# Patient Record
Sex: Male | Born: 1940
Health system: Southern US, Community
[De-identification: ages and names within clinical notes are randomized; demographics above are authoritative.]

## PROBLEM LIST (undated history)

## (undated) DIAGNOSIS — F329 Major depressive disorder, single episode, unspecified: Secondary | ICD-10-CM

## (undated) DIAGNOSIS — F32A Depression, unspecified: Secondary | ICD-10-CM

## (undated) DIAGNOSIS — C801 Malignant (primary) neoplasm, unspecified: Secondary | ICD-10-CM

## (undated) DIAGNOSIS — G20A1 Parkinson's disease without dyskinesia, without mention of fluctuations: Secondary | ICD-10-CM

## (undated) DIAGNOSIS — G459 Transient cerebral ischemic attack, unspecified: Secondary | ICD-10-CM

## (undated) DIAGNOSIS — K4021 Bilateral inguinal hernia, without obstruction or gangrene, recurrent: Secondary | ICD-10-CM

## (undated) DIAGNOSIS — G20 Parkinson's disease: Secondary | ICD-10-CM

## (undated) DIAGNOSIS — N200 Calculus of kidney: Secondary | ICD-10-CM

## (undated) DIAGNOSIS — F419 Anxiety disorder, unspecified: Secondary | ICD-10-CM

## (undated) DIAGNOSIS — E119 Type 2 diabetes mellitus without complications: Secondary | ICD-10-CM

## (undated) DIAGNOSIS — R112 Nausea with vomiting, unspecified: Secondary | ICD-10-CM

## (undated) DIAGNOSIS — R51 Headache: Secondary | ICD-10-CM

## (undated) DIAGNOSIS — R011 Cardiac murmur, unspecified: Secondary | ICD-10-CM

## (undated) DIAGNOSIS — K219 Gastro-esophageal reflux disease without esophagitis: Secondary | ICD-10-CM

## (undated) DIAGNOSIS — Z9889 Other specified postprocedural states: Secondary | ICD-10-CM

## (undated) DIAGNOSIS — G2581 Restless legs syndrome: Secondary | ICD-10-CM

## (undated) DIAGNOSIS — I10 Essential (primary) hypertension: Secondary | ICD-10-CM

## (undated) DIAGNOSIS — Z952 Presence of prosthetic heart valve: Secondary | ICD-10-CM

## (undated) DIAGNOSIS — I350 Nonrheumatic aortic (valve) stenosis: Secondary | ICD-10-CM

## (undated) DIAGNOSIS — M1990 Unspecified osteoarthritis, unspecified site: Secondary | ICD-10-CM

## (undated) HISTORY — PX: COLONOSCOPY: SHX174

## (undated) HISTORY — PX: EYE SURGERY: SHX253

---

## 1998-11-01 ENCOUNTER — Encounter: Payer: Self-pay | Admitting: Occupational Medicine

## 1998-11-01 ENCOUNTER — Ambulatory Visit: Admission: RE | Admit: 1998-11-01 | Discharge: 1998-11-01 | Payer: Self-pay | Admitting: Occupational Medicine

## 1998-11-24 ENCOUNTER — Ambulatory Visit: Admission: RE | Admit: 1998-11-24 | Discharge: 1998-11-24 | Payer: Self-pay | Admitting: Occupational Medicine

## 1998-11-24 ENCOUNTER — Encounter: Payer: Self-pay | Admitting: Occupational Medicine

## 1998-11-29 ENCOUNTER — Ambulatory Visit (HOSPITAL_COMMUNITY): Admission: RE | Admit: 1998-11-29 | Discharge: 1998-11-29 | Payer: Self-pay | Admitting: Internal Medicine

## 1998-11-29 ENCOUNTER — Encounter: Payer: Self-pay | Admitting: Internal Medicine

## 1998-12-09 ENCOUNTER — Ambulatory Visit: Admission: RE | Admit: 1998-12-09 | Discharge: 1998-12-09 | Payer: Self-pay | Admitting: Internal Medicine

## 2000-12-05 ENCOUNTER — Encounter: Admission: RE | Admit: 2000-12-05 | Discharge: 2000-12-05 | Payer: Self-pay | Admitting: Internal Medicine

## 2000-12-05 ENCOUNTER — Encounter: Payer: Self-pay | Admitting: Internal Medicine

## 2003-11-09 ENCOUNTER — Ambulatory Visit (HOSPITAL_BASED_OUTPATIENT_CLINIC_OR_DEPARTMENT_OTHER): Admission: RE | Admit: 2003-11-09 | Discharge: 2003-11-09 | Payer: Self-pay | Admitting: Internal Medicine

## 2006-05-15 HISTORY — PX: OTHER SURGICAL HISTORY: SHX169

## 2006-12-07 ENCOUNTER — Encounter: Admission: RE | Admit: 2006-12-07 | Discharge: 2007-03-07 | Payer: Self-pay | Admitting: Neurology

## 2006-12-11 DIAGNOSIS — G20 Parkinson's disease: Secondary | ICD-10-CM

## 2006-12-11 DIAGNOSIS — G20A1 Parkinson's disease without dyskinesia, without mention of fluctuations: Secondary | ICD-10-CM

## 2006-12-11 HISTORY — DX: Parkinson's disease: G20

## 2006-12-11 HISTORY — DX: Parkinson's disease without dyskinesia, without mention of fluctuations: G20.A1

## 2007-02-08 ENCOUNTER — Inpatient Hospital Stay (HOSPITAL_COMMUNITY): Admission: EM | Admit: 2007-02-08 | Discharge: 2007-02-09 | Payer: Self-pay | Admitting: Neurology

## 2007-02-08 ENCOUNTER — Encounter: Payer: Self-pay | Admitting: Emergency Medicine

## 2007-02-08 DIAGNOSIS — G459 Transient cerebral ischemic attack, unspecified: Secondary | ICD-10-CM

## 2007-02-08 HISTORY — DX: Transient cerebral ischemic attack, unspecified: G45.9

## 2007-03-08 ENCOUNTER — Encounter: Admission: RE | Admit: 2007-03-08 | Discharge: 2007-06-06 | Payer: Self-pay | Admitting: Neurology

## 2009-04-13 ENCOUNTER — Encounter: Payer: Self-pay | Admitting: Neurology

## 2009-04-14 ENCOUNTER — Encounter: Payer: Self-pay | Admitting: Neurology

## 2009-05-15 ENCOUNTER — Encounter: Payer: Self-pay | Admitting: Neurology

## 2009-06-15 ENCOUNTER — Encounter: Payer: Self-pay | Admitting: Neurology

## 2009-12-22 ENCOUNTER — Encounter: Admission: RE | Admit: 2009-12-22 | Discharge: 2010-02-09 | Payer: Self-pay | Admitting: Neurology

## 2010-09-27 NOTE — H&P (Signed)
NAME:  [name], [name]              ACCOUNT NO.:  [id number]   MEDICAL RECORD NO.:  [id number]          PATIENT TYPE:  INP   LOCATION:  3706                         FACILITY:  [facility]   PHYSICIAN:  Michael L. Reynolds, M.D.DATE OF BIRTH:  1941/02/08   DATE OF ADMISSION:  02/08/2007  DATE OF DISCHARGE:  02/09/2007                              HISTORY & PHYSICAL   CHIEF COMPLAINT:  Confusion, right hand numbness.   HISTORY OF PRESENT ILLNESS:  This is the initial stroke service  admission for this 70 year old man with a history of Parkinson disease  followed through our office by Dr. Vickey Huger.  The patient and his wife  reports that he was in his usual state of health until this afternoon  about 3:30 or 4 p.m.  He then suddenly became confused.  He seemed to  have difficulty understanding how to perform simple actions, such as a  opening door to walk out into the yard.  His wife also felt that he was  having difficulty finding words and was using words inappropriately.  The patient says that around this time he had a numb sensation in is  right hand.  The symptoms lasted about 45 minutes and then improved.  He  feels now that he is pretty much back to normal, although he says his  legs feel a little bit weak.  He has never had any symptoms like this  before.  He came to the emergency department this evening, at which time  a Code Stroke was called, and because his symptoms are suspicious for  cerebrovascular disease, he is admitted for further considerations.   PAST MEDICAL HISTORY:  Remarkable for Parkinson disease as noted above.  Family is unaware of any chronic medical problems.   FAMILY HISTORY:  Remarkable for a stroke and possible Alzheimer disease  in his father.   SOCIAL:  He lives with his wife.  He is normally independent in his  activities of daily living.  He denies any history of tobacco or alcohol  use.   ALLERGIES:  No known drug allergies.   MEDICATIONS:  1.  Requip XL 4 mg q.h.s.  2. Zantac 300 mg q.h.s.  3. Klonopin 1 mg q.h.s.  4. Baby aspirin.  5. Iron.  6. Vitamin D.  7. Vitamin E.  8. Fiber.  9. Colace, Darvocet p.r.n.  10.Fioricet p.r.n.   REVIEW OF SYSTEMS:  A full 10-system review of systems is negative  except as outlined in the HPI and the accompanying nursing records.   PHYSICAL EXAMINATION:  Vital Signs: Temperature:  97.5, blood pressure  148/88, pulse 79, respirations 20.  GENERAL EXAMINATION:  This is a healthy-appearing man supine in the  hospital bed in no evident distress.  HEAD:  Cranium is normocephalic and atraumatic.  Oropharynx benign.  NECK:  Supple without carotid or supraclavicular bruits.  CHEST:  Clear to auscultation bilaterally.  HEART:  Regular rate and rhythm without murmurs.  ABDOMEN:  Soft, normoactive bowel sounds.  EXTREMITIES:  No edema, 2+ pulses.  NEUROLOGIC:  Mental status:  He is awake and alert.  He is oriented  to  time and place.  His speech is somewhat soft but appropriate in content.  He is able to name objects and repeat phrases without difficulty.  Cranial Nerves:  Pupils are equal and briskly reactive.  Extraocular  movements reveal some poor smooth pursuit and slight and slight  limitation of upgaze.  Visual fields are full to confrontation.  Hearing  is intact to conversational speech.  Face, tongue and palate move  normally and symmetrically with some masking of facial features.  Motor:  Normal bulk.  No cogwheeling to my examination.  Normal strength in all  tested extremity muscles.  Sensation intact to pinprick and light touch  in all extremities, intact to double simultaneous stimulation.  Coordination: Finger-nose as performed accurately.  Gait:  He arises  easily from a chair and his stance is normal.  He is able to ambulate  without much difficulty.  Reflexes diminished throughout.  Toes are  downgoing bilaterally.   LABORATORY DATA:  CBC:  White count 6.5, hemoglobin 15.3,  platelets  145,000.  CMET is normal except for an elevated glucose of 125.  CT of  the head demonstrates questionable old right subcortical stroke without  acute finding.   IMPRESSION:  Episode of acute confusion, speech disturbance, and right  hand numbness, suspicious for a left brain transient ischemic attack.   PLAN:  Will admit to the stroke service for brief stroke workup  including MRI of the brain, MRA of the intracranial and extracranial  circulation, with telemetry monitoring and stroke labs.  I will also  check a few blood sugars given that his blood sugar slightly elevated.  Hopefully, he can be discharged soon.      Michael L. Thad Ranger, M.D.  Electronically Signed     MLR/MEDQ  D:  02/08/2007  T:  02/09/2007  Job:  [id number]

## 2010-09-27 NOTE — Discharge Summary (Signed)
NAME:  [name], [name]              ACCOUNT NO.:  [id number]   MEDICAL RECORD NO.:  [id number]          PATIENT TYPE:  INP   LOCATION:  3706                         FACILITY:  [facility]   PHYSICIAN:  Michael L. Reynolds, M.D.DATE OF BIRTH:  Aug 22, 1940   DATE OF ADMISSION:  02/08/2007  DATE OF DISCHARGE:  02/09/2007                               DISCHARGE SUMMARY   ADMISSION DIAGNOSES:  1. Transient confusion and right-sided numbness,  ? acute stroke.  2. History of Parkinson disease.   DISCHARGE DIAGNOSES:  1. Episode of transient neurologic function of uncertain etiology      without evidence of acute stroke.  2. Parkinson disease.   CONDITION ON DISCHARGE:  Improved.   DIET:  Low-salt, low-fat, low-cholesterol diet.   ACTIVITY:  Ad lib.   MEDICATIONS ON DISCHARGE:  1. Aspirin 325 mg daily (new dose).  2. Requip 4 mg daily.  3. Zantac 300 mg daily.  4. Clonazepam 1 mg daily.  5. Ferrous sulfate 324 mg daily.  6. Vitamin D.  7. Vitamin E.  8. Fiber.  9. Colace.  10.Darvocet-N 100 p.r.n.  11.Fioricet p.r.n.   STUDIES:  1. MRI of the brain demonstrating mild atrophy.  No significant white      matter disease.  No acute findings.  2. MRA of the intracranial circulation, no acute findings.  3. MRA of the neck with contrast, mild changes of fibromuscular      dysplasia in the mid-carotid area.  No significant stenoses.   LABORATORY DATA:  CBC normal on admission.  CMP normal on admission  except for an elevated glucose of 125,  fasting BMAT 927 normal with  glucose 92, Hemoglobin A1c of 6.0, cardiac enzymes negative.  Lipid  panel normal with HDL 32 and LDL 85.  Urinalysis negative. Homocysteine  level pending at discharge.   HOSPITAL COURSE:  Please see admission H and P for full admission  details.  Briefly this is a 70 year old man with a history of Parkinson  disease who presented to [facility] after an episode of  transient confusion, inability to open a  door, difficulty with word  finding and right hand numbness which all lasted about 30 to 45 minutes  and then resolved.  He was brought to [facility] to the stroke  unit for consideration of his possible stroke syndrome.  His laboratory  studies were as above.  MRI of the brain was done, and the results were  as above.  On exam Saturday morning the patient felt back to normal.  At  that time it was felt that he was stable for discharge.  He states that  he had had a 2D echocardiogram done at [facility] in July of this  year, and so it was decided to not have him stay in the hospital to have  that study repeated.  He is, therefore, discharged in the care of his  family.   FOLLOW UP/PENDING ISSUES:  1. He was advised to follow up with his primary care doctor, Dr.      Earl Gala, in the next couple of weeks.  2. He was asked to call [facility] at [phone] for      a follow-up appointment with Dr. Vickey Huger as well as for an      outpatient EEG.  3. We also need to acquire the final report of his echocardiogram done      in July of last year for review.  We will discuss this further with      Dr. Vickey Huger on Monday, December 11, 2006.      Michael L. Thad Ranger, M.D.  Electronically Signed     MLR/MEDQ  D:  02/09/2007  T:  02/10/2007  Job:  [id number]   cc:   Theressa Millard, M.D.  Melvyn Novas, M.D.

## 2010-10-11 ENCOUNTER — Ambulatory Visit: Payer: Medicare Other | Attending: Neurology | Admitting: Physical Therapy

## 2010-10-11 DIAGNOSIS — IMO0001 Reserved for inherently not codable concepts without codable children: Secondary | ICD-10-CM | POA: Insufficient documentation

## 2010-10-11 DIAGNOSIS — R269 Unspecified abnormalities of gait and mobility: Secondary | ICD-10-CM | POA: Insufficient documentation

## 2010-10-11 DIAGNOSIS — G20 Parkinson's disease: Secondary | ICD-10-CM | POA: Insufficient documentation

## 2010-10-11 DIAGNOSIS — G20A1 Parkinson's disease without dyskinesia, without mention of fluctuations: Secondary | ICD-10-CM | POA: Insufficient documentation

## 2010-10-11 DIAGNOSIS — R293 Abnormal posture: Secondary | ICD-10-CM | POA: Insufficient documentation

## 2010-10-18 ENCOUNTER — Ambulatory Visit: Payer: Medicare Other | Attending: Neurology | Admitting: Physical Therapy

## 2010-10-18 DIAGNOSIS — IMO0001 Reserved for inherently not codable concepts without codable children: Secondary | ICD-10-CM | POA: Insufficient documentation

## 2010-10-18 DIAGNOSIS — R269 Unspecified abnormalities of gait and mobility: Secondary | ICD-10-CM | POA: Insufficient documentation

## 2010-10-18 DIAGNOSIS — G20 Parkinson's disease: Secondary | ICD-10-CM | POA: Insufficient documentation

## 2010-10-18 DIAGNOSIS — R293 Abnormal posture: Secondary | ICD-10-CM | POA: Insufficient documentation

## 2010-10-18 DIAGNOSIS — R4789 Other speech disturbances: Secondary | ICD-10-CM | POA: Insufficient documentation

## 2010-10-18 DIAGNOSIS — G20A1 Parkinson's disease without dyskinesia, without mention of fluctuations: Secondary | ICD-10-CM | POA: Insufficient documentation

## 2010-10-20 ENCOUNTER — Ambulatory Visit: Payer: BLUE CROSS/BLUE SHIELD | Admitting: Physical Therapy

## 2010-10-24 ENCOUNTER — Ambulatory Visit: Payer: Medicare Other | Admitting: Physical Therapy

## 2010-10-26 ENCOUNTER — Ambulatory Visit: Payer: Medicare Other

## 2010-10-26 ENCOUNTER — Ambulatory Visit: Payer: Medicare Other | Admitting: Physical Therapy

## 2010-11-01 ENCOUNTER — Ambulatory Visit: Payer: Medicare Other

## 2010-11-01 ENCOUNTER — Ambulatory Visit: Payer: Medicare Other | Admitting: Physical Therapy

## 2010-11-03 ENCOUNTER — Ambulatory Visit: Payer: Medicare Other | Admitting: Physical Therapy

## 2010-11-03 ENCOUNTER — Ambulatory Visit: Payer: Medicare Other

## 2010-11-08 ENCOUNTER — Ambulatory Visit: Payer: Medicare Other | Admitting: Physical Therapy

## 2010-11-08 ENCOUNTER — Ambulatory Visit: Payer: Medicare Other

## 2010-11-11 ENCOUNTER — Ambulatory Visit: Payer: Medicare Other

## 2010-11-11 ENCOUNTER — Ambulatory Visit: Payer: Medicare Other | Admitting: Physical Therapy

## 2010-11-15 ENCOUNTER — Ambulatory Visit: Payer: BLUE CROSS/BLUE SHIELD | Admitting: Physical Therapy

## 2010-11-15 ENCOUNTER — Ambulatory Visit: Payer: Medicare Other | Attending: Neurology

## 2010-11-15 DIAGNOSIS — R269 Unspecified abnormalities of gait and mobility: Secondary | ICD-10-CM | POA: Insufficient documentation

## 2010-11-15 DIAGNOSIS — R4789 Other speech disturbances: Secondary | ICD-10-CM | POA: Insufficient documentation

## 2010-11-15 DIAGNOSIS — IMO0001 Reserved for inherently not codable concepts without codable children: Secondary | ICD-10-CM | POA: Insufficient documentation

## 2010-11-15 DIAGNOSIS — G20A1 Parkinson's disease without dyskinesia, without mention of fluctuations: Secondary | ICD-10-CM | POA: Insufficient documentation

## 2010-11-15 DIAGNOSIS — R293 Abnormal posture: Secondary | ICD-10-CM | POA: Insufficient documentation

## 2010-11-15 DIAGNOSIS — G20 Parkinson's disease: Secondary | ICD-10-CM | POA: Insufficient documentation

## 2010-11-18 ENCOUNTER — Ambulatory Visit: Payer: Medicare Other

## 2010-11-18 ENCOUNTER — Ambulatory Visit: Payer: BLUE CROSS/BLUE SHIELD | Admitting: Physical Therapy

## 2010-11-22 ENCOUNTER — Ambulatory Visit: Payer: Medicare Other

## 2010-11-22 ENCOUNTER — Ambulatory Visit: Payer: BLUE CROSS/BLUE SHIELD | Admitting: Physical Therapy

## 2010-11-23 ENCOUNTER — Ambulatory Visit: Payer: Medicare Other

## 2010-11-24 ENCOUNTER — Ambulatory Visit: Payer: BLUE CROSS/BLUE SHIELD | Admitting: Physical Therapy

## 2011-02-23 ENCOUNTER — Other Ambulatory Visit: Payer: Self-pay | Admitting: Internal Medicine

## 2011-02-23 DIAGNOSIS — R10814 Left lower quadrant abdominal tenderness: Secondary | ICD-10-CM

## 2011-02-23 LAB — BASIC METABOLIC PANEL
Chloride: 111
Creatinine, Ser: 0.92
GFR calc Af Amer: >60
Potassium: 4.3

## 2011-02-23 LAB — URINALYSIS, ROUTINE W REFLEX MICROSCOPIC
Bilirubin Urine: NEGATIVE
Glucose, UA: NEGATIVE
Hgb urine dipstick: NEGATIVE
Ketones, ur: NEGATIVE
Protein, ur: NEGATIVE

## 2011-02-23 LAB — COMPREHENSIVE METABOLIC PANEL
ALT: 14
AST: 27
Alkaline Phosphatase: 104
GFR calc Af Amer: >60
Glucose, Bld: 125 — ABNORMAL HIGH
Potassium: 4
Sodium: 141
Total Protein: 6.8

## 2011-02-23 LAB — DIFFERENTIAL
Basophils Absolute: 0
Basophils Relative: 0
Eosinophils Absolute: 0.2
Eosinophils Relative: 4

## 2011-02-23 LAB — HOMOCYSTEINE: Homocysteine: 11.4

## 2011-02-23 LAB — PROTIME-INR: Prothrombin Time: 14.1

## 2011-02-23 LAB — HEMOGLOBIN A1C
Hgb A1c MFr Bld: 6
Hgb A1c MFr Bld: 6
Mean Plasma Glucose: 136
Mean Plasma Glucose: 136

## 2011-02-23 LAB — CBC
Hemoglobin: 15.3
RBC: 5.3
RDW: 13.4

## 2011-02-23 LAB — CK TOTAL AND CKMB (NOT AT ARMC): Relative Index: INVALID

## 2011-02-27 ENCOUNTER — Ambulatory Visit
Admission: RE | Admit: 2011-02-27 | Discharge: 2011-02-27 | Disposition: A | Discharge status: Home / Self Care | Payer: Medicare Other | Source: Ambulatory Visit | Attending: Internal Medicine | Admitting: Internal Medicine

## 2011-02-27 DIAGNOSIS — R10814 Left lower quadrant abdominal tenderness: Secondary | ICD-10-CM

## 2011-02-27 MED ORDER — IOHEXOL 300 MG/ML  SOLN
100.0000 mL | Freq: Once | INTRAMUSCULAR | Status: AC | PRN
  Administered 2011-02-27: 100 mL via INTRAVENOUS

## 2011-05-25 DIAGNOSIS — Z85828 Personal history of other malignant neoplasm of skin: Secondary | ICD-10-CM | POA: Diagnosis not present

## 2011-05-28 ENCOUNTER — Encounter (HOSPITAL_COMMUNITY): Payer: Self-pay | Admitting: Emergency Medicine

## 2011-05-28 ENCOUNTER — Emergency Department (HOSPITAL_COMMUNITY): Payer: Medicare Other

## 2011-05-28 ENCOUNTER — Emergency Department (HOSPITAL_COMMUNITY)
Admission: EM | Admit: 2011-05-28 | Discharge: 2011-05-28 | Disposition: A | Discharge status: Home / Self Care | Payer: Medicare Other | Attending: Emergency Medicine | Admitting: Emergency Medicine

## 2011-05-28 DIAGNOSIS — N23 Unspecified renal colic: Secondary | ICD-10-CM | POA: Diagnosis not present

## 2011-05-28 DIAGNOSIS — N201 Calculus of ureter: Secondary | ICD-10-CM | POA: Insufficient documentation

## 2011-05-28 DIAGNOSIS — R109 Unspecified abdominal pain: Secondary | ICD-10-CM | POA: Diagnosis not present

## 2011-05-28 DIAGNOSIS — G20 Parkinson's disease: Secondary | ICD-10-CM | POA: Insufficient documentation

## 2011-05-28 DIAGNOSIS — N200 Calculus of kidney: Secondary | ICD-10-CM

## 2011-05-28 DIAGNOSIS — K219 Gastro-esophageal reflux disease without esophagitis: Secondary | ICD-10-CM | POA: Diagnosis not present

## 2011-05-28 DIAGNOSIS — N1330 Unspecified hydronephrosis: Secondary | ICD-10-CM | POA: Insufficient documentation

## 2011-05-28 DIAGNOSIS — Z7982 Long term (current) use of aspirin: Secondary | ICD-10-CM | POA: Insufficient documentation

## 2011-05-28 DIAGNOSIS — Z79899 Other long term (current) drug therapy: Secondary | ICD-10-CM | POA: Insufficient documentation

## 2011-05-28 DIAGNOSIS — N3090 Cystitis, unspecified without hematuria: Secondary | ICD-10-CM | POA: Diagnosis not present

## 2011-05-28 DIAGNOSIS — G20A1 Parkinson's disease without dyskinesia, without mention of fluctuations: Secondary | ICD-10-CM | POA: Insufficient documentation

## 2011-05-28 HISTORY — DX: Nonrheumatic aortic (valve) stenosis: I35.0

## 2011-05-28 HISTORY — DX: Headache: R51

## 2011-05-28 HISTORY — DX: Parkinson's disease: G20

## 2011-05-28 HISTORY — DX: Parkinson's disease without dyskinesia, without mention of fluctuations: G20.A1

## 2011-05-28 HISTORY — DX: Anxiety disorder, unspecified: F41.9

## 2011-05-28 HISTORY — DX: Calculus of kidney: N20.0

## 2011-05-28 HISTORY — DX: Gastro-esophageal reflux disease without esophagitis: K21.9

## 2011-05-28 LAB — URINALYSIS, ROUTINE W REFLEX MICROSCOPIC
Ketones, ur: NEGATIVE mg/dL
Leukocytes, UA: NEGATIVE
Nitrite: NEGATIVE
Protein, ur: NEGATIVE mg/dL
Urobilinogen, UA: 1 mg/dL (ref 0.0–1.0)

## 2011-05-28 LAB — DIFFERENTIAL
Basophils Relative: 0 % (ref 0–1)
Eosinophils Absolute: 0.1 10*3/uL (ref 0.0–0.7)
Eosinophils Relative: 1 % (ref 0–5)
Lymphocytes Relative: 5 % — ABNORMAL LOW (ref 12–46)
Neutro Abs: 9.7 10*3/uL — ABNORMAL HIGH (ref 1.7–7.7)

## 2011-05-28 LAB — COMPREHENSIVE METABOLIC PANEL
Albumin: 4.1 g/dL (ref 3.5–5.2)
Alkaline Phosphatase: 94 U/L (ref 39–117)
BUN: 17 mg/dL (ref 6–23)
CO2: 26 mEq/L (ref 19–32)
Chloride: 103 mEq/L (ref 96–112)
Creatinine, Ser: 1.35 mg/dL (ref 0.50–1.35)
GFR calc Af Amer: 59 mL/min — ABNORMAL LOW (ref >90)
GFR calc non Af Amer: 51 mL/min — ABNORMAL LOW (ref >90)
Glucose, Bld: 170 mg/dL — ABNORMAL HIGH (ref 70–99)
Total Bilirubin: 0.4 mg/dL (ref 0.3–1.2)

## 2011-05-28 LAB — CBC
HCT: 41.8 % (ref 39.0–52.0)
Hemoglobin: 14.2 g/dL (ref 13.0–17.0)
MCHC: 34 g/dL (ref 30.0–36.0)
RBC: 4.94 MIL/uL (ref 4.22–5.81)

## 2011-05-28 LAB — URINE MICROSCOPIC-ADD ON

## 2011-05-28 MED ORDER — OXYCODONE-ACETAMINOPHEN 5-325 MG PO TABS: 1.0000 | ORAL_TABLET | Freq: Four times a day (QID) | ORAL | Status: AC | PRN

## 2011-05-28 MED ORDER — TAMSULOSIN HCL 0.4 MG PO CAPS: 0.4000 mg | ORAL_CAPSULE | Freq: Every day | ORAL | Status: DC

## 2011-05-28 NOTE — ED Notes (Signed)
Pt c/o left flank pain since yesterday at three pm with radiation to groin and llq. Pt has hx of previous kidney stones. Pt has taken his home medication for same.

## 2011-05-28 NOTE — ED Notes (Signed)
The patient states that his pain has gradually dissipated, so he does not need any medicine for his pain.

## 2011-05-28 NOTE — ED Provider Notes (Addendum)
History     CSN: [id number]  Arrival date & time 05/28/11  0156   First MD Initiated Contact with Patient 05/28/11 0216      Chief Complaint  Patient presents with  . Flank Pain    (Consider location/radiation/quality/duration/timing/severity/associated sxs/prior treatment) HPI Comments: History of kidney stones in the past.  This feels the same.  Patient is a 71 y.o. male presenting with flank pain. The history is provided by the patient.  Flank Pain This is a recurrent problem. The current episode started yesterday. The problem occurs constantly. The problem has been rapidly worsening. Associated symptoms include abdominal pain. The symptoms are aggravated by nothing. The symptoms are relieved by nothing. The treatment provided no relief.    Past Medical History  Diagnosis Date  . Kidney stones   . Parkinson disease   . GERD (gastroesophageal reflux disease)   . Aortic stenosis   . Anxiety   . Headache     History reviewed. No pertinent past surgical history.  No family history on file.  History  Substance Use Topics  . Smoking status: Not on file  . Smokeless tobacco: Not on file  . Alcohol Use:       Review of Systems  Gastrointestinal: Positive for abdominal pain.  Genitourinary: Positive for flank pain.  All other systems reviewed and are negative.    Allergies  Vicodin  Home Medications   Current Outpatient Rx  Name Route Sig Dispense Refill  . ACETAMINOPHEN 500 MG PO TABS Oral Take 1,000 mg by mouth as needed. For pain    . VANQUISH PO Oral Take by mouth.    . ASPIRIN 325 MG PO TABS Oral Take 325 mg by mouth daily.    Marland Kitchen BUTALBITAL-APAP-CAFFEINE 50-500-40 MG PO TABS Oral Take 1 tablet by mouth every 4 (four) hours as needed. For headaches    . CARBIDOPA-LEVODOPA 25-100 MG PO TABS Oral Take 0.5 tablets by mouth 3 (three) times daily.    Marland Kitchen CLONAZEPAM 1 MG PO TABS Oral Take 1 mg by mouth at bedtime.    . OMEGA-3 FATTY ACIDS 1000 MG PO CAPS Oral  Take 1 g by mouth daily.    Marland Kitchen LORAZEPAM 0.5 MG PO TABS Oral Take 0.5 mg by mouth daily as needed. For tension    . MELOXICAM 7.5 MG PO TABS Oral Take 7.5 mg by mouth daily as needed. For hip pain/arthritis    . OMEPRAZOLE 20 MG PO CPDR Oral Take 20 mg by mouth daily.    Marland Kitchen OVER THE COUNTER MEDICATION Oral Take 1 capsule by mouth 2 (two) times daily. Vitamin D 800 IU    . ROPINIROLE HCL ER 4 MG PO TB24 Oral Take 4 mg by mouth daily.    . TRAMADOL HCL 50 MG PO TABS Oral Take 50 mg by mouth daily as needed. For pain    . VITAMIN E 400 UNITS PO CAPS Oral Take 400 Units by mouth daily.      BP 128/72  Pulse 81  Temp(Src) 97.7 F (36.5 C) (Oral)  Resp 17  SpO2 95%  Physical Exam  Nursing note and vitals reviewed. Constitutional: He is oriented to person, place, and time. He appears well-developed and well-nourished. No distress.  HENT:  Head: Normocephalic and atraumatic.  Neck: Normal range of motion. Neck supple.  Cardiovascular: Normal rate and regular rhythm.   No murmur heard. Pulmonary/Chest: Effort normal and breath sounds normal. No respiratory distress.  Abdominal: Soft. Bowel sounds are normal.  He exhibits no distension. There is no tenderness.       There is ttp in the left flank.  Musculoskeletal: Normal range of motion.  Neurological: He is alert and oriented to person, place, and time.  Skin: Skin is warm and dry.    ED Course  Procedures (including critical care time)  Labs Reviewed  CBC - Abnormal; Notable for the following:    WBC 10.7 (*)    Platelets 116 (*) PLATELET COUNT CONFIRMED BY SMEAR   All other components within normal limits  DIFFERENTIAL - Abnormal; Notable for the following:    Neutrophils Relative 90 (*)    Lymphocytes Relative 5 (*)    Neutro Abs 9.7 (*)    Lymphs Abs 0.5 (*)    All other components within normal limits  COMPREHENSIVE METABOLIC PANEL - Abnormal; Notable for the following:    Glucose, Bld 170 (*)    GFR calc non Af Amer 51 (*)     GFR calc Af Amer 59 (*)    All other components within normal limits   No results found.   No diagnosis found.    MDM  CT reveals stone just proximal to uvj, about 5 mm in size.  Feels better.  Will discharge with flomax and hydrocodone.        Geoffery Lyons, MD 05/28/11 7829  Geoffery Lyons, MD 06/27/11 [phone]

## 2011-05-28 NOTE — ED Notes (Signed)
Family at bedside. 

## 2011-05-28 NOTE — ED Notes (Signed)
Patient is resting comfortably. 

## 2011-05-28 NOTE — ED Notes (Signed)
Patient is AOx4 and feels comfortable with discharge instructions and the MD's plan of care.

## 2011-05-29 ENCOUNTER — Ambulatory Visit (HOSPITAL_COMMUNITY)
Admission: AD | Admit: 2011-05-29 | Discharge: 2011-05-29 | Disposition: A | Discharge status: Home / Self Care | Payer: Medicare Other | Source: Ambulatory Visit | Attending: Urology | Admitting: Urology

## 2011-05-29 ENCOUNTER — Encounter (HOSPITAL_COMMUNITY): Payer: Self-pay | Admitting: *Deleted

## 2011-05-29 ENCOUNTER — Encounter (HOSPITAL_COMMUNITY)
Admission: AD | Disposition: A | Discharge status: Home / Self Care | Payer: Self-pay | Source: Ambulatory Visit | Attending: Urology

## 2011-05-29 ENCOUNTER — Other Ambulatory Visit: Payer: Self-pay

## 2011-05-29 ENCOUNTER — Encounter (HOSPITAL_COMMUNITY): Payer: Self-pay | Admitting: Anesthesiology

## 2011-05-29 ENCOUNTER — Ambulatory Visit: Admit: 2011-05-29 | Payer: Self-pay | Admitting: Urology

## 2011-05-29 ENCOUNTER — Encounter (HOSPITAL_COMMUNITY): Payer: Self-pay | Admitting: Emergency Medicine

## 2011-05-29 ENCOUNTER — Ambulatory Visit (HOSPITAL_COMMUNITY): Payer: Medicare Other | Admitting: Anesthesiology

## 2011-05-29 ENCOUNTER — Other Ambulatory Visit: Payer: Self-pay | Admitting: Urology

## 2011-05-29 ENCOUNTER — Emergency Department (HOSPITAL_COMMUNITY)
Admission: EM | Admit: 2011-05-29 | Discharge: 2011-05-29 | Disposition: A | Discharge status: Home / Self Care | Payer: Medicare Other | Source: Home / Self Care | Attending: Emergency Medicine | Admitting: Emergency Medicine

## 2011-05-29 DIAGNOSIS — R079 Chest pain, unspecified: Secondary | ICD-10-CM | POA: Diagnosis not present

## 2011-05-29 DIAGNOSIS — K219 Gastro-esophageal reflux disease without esophagitis: Secondary | ICD-10-CM | POA: Insufficient documentation

## 2011-05-29 DIAGNOSIS — N200 Calculus of kidney: Secondary | ICD-10-CM | POA: Insufficient documentation

## 2011-05-29 DIAGNOSIS — N1330 Unspecified hydronephrosis: Secondary | ICD-10-CM | POA: Insufficient documentation

## 2011-05-29 DIAGNOSIS — Z7982 Long term (current) use of aspirin: Secondary | ICD-10-CM | POA: Insufficient documentation

## 2011-05-29 DIAGNOSIS — N201 Calculus of ureter: Secondary | ICD-10-CM | POA: Insufficient documentation

## 2011-05-29 DIAGNOSIS — N23 Unspecified renal colic: Secondary | ICD-10-CM

## 2011-05-29 DIAGNOSIS — Z01810 Encounter for preprocedural cardiovascular examination: Secondary | ICD-10-CM | POA: Insufficient documentation

## 2011-05-29 DIAGNOSIS — Z8673 Personal history of transient ischemic attack (TIA), and cerebral infarction without residual deficits: Secondary | ICD-10-CM | POA: Insufficient documentation

## 2011-05-29 DIAGNOSIS — G20 Parkinson's disease: Secondary | ICD-10-CM | POA: Insufficient documentation

## 2011-05-29 DIAGNOSIS — Z01812 Encounter for preprocedural laboratory examination: Secondary | ICD-10-CM | POA: Insufficient documentation

## 2011-05-29 DIAGNOSIS — R1084 Generalized abdominal pain: Secondary | ICD-10-CM | POA: Diagnosis not present

## 2011-05-29 DIAGNOSIS — Z79899 Other long term (current) drug therapy: Secondary | ICD-10-CM | POA: Insufficient documentation

## 2011-05-29 DIAGNOSIS — G20A1 Parkinson's disease without dyskinesia, without mention of fluctuations: Secondary | ICD-10-CM | POA: Insufficient documentation

## 2011-05-29 DIAGNOSIS — R109 Unspecified abdominal pain: Secondary | ICD-10-CM | POA: Diagnosis not present

## 2011-05-29 DIAGNOSIS — I359 Nonrheumatic aortic valve disorder, unspecified: Secondary | ICD-10-CM | POA: Insufficient documentation

## 2011-05-29 HISTORY — DX: Parkinson's disease: G20

## 2011-05-29 HISTORY — PX: CYSTOSCOPY/RETROGRADE/URETEROSCOPY: SHX5316

## 2011-05-29 HISTORY — DX: Transient cerebral ischemic attack, unspecified: G45.9

## 2011-05-29 LAB — SURGICAL PCR SCREEN: Staphylococcus aureus: NEGATIVE

## 2011-05-29 SURGERY — CYSTOSCOPY/RETROGRADE/URETEROSCOPY
Anesthesia: General | Site: Ureter | Laterality: Left | Wound class: Clean Contaminated

## 2011-05-29 MED ORDER — ONDANSETRON HCL 4 MG/2ML IJ SOLN
INTRAMUSCULAR | Status: DC | PRN
  Administered 2011-05-29: 4 mg via INTRAVENOUS

## 2011-05-29 MED ORDER — PROPOFOL 10 MG/ML IV EMUL
INTRAVENOUS | Status: DC | PRN
  Administered 2011-05-29: 150 mg via INTRAVENOUS

## 2011-05-29 MED ORDER — DIATRIZOATE MEGLUMINE 30 % UR SOLN
URETHRAL | Status: DC | PRN
  Administered 2011-05-29: 5 mL via URETHRAL

## 2011-05-29 MED ORDER — IOHEXOL 300 MG/ML  SOLN
INTRAMUSCULAR | Status: AC
  Filled 2011-05-29: qty 1

## 2011-05-29 MED ORDER — FENTANYL CITRATE 0.05 MG/ML IJ SOLN
INTRAMUSCULAR | Status: DC | PRN
  Administered 2011-05-29 (×4): 25 ug via INTRAVENOUS

## 2011-05-29 MED ORDER — CLONAZEPAM 1 MG PO TABS: 1.0000 mg | ORAL_TABLET | Freq: Every day | ORAL | Status: DC

## 2011-05-29 MED ORDER — ONDANSETRON HCL 4 MG/2ML IJ SOLN
4.0000 mg | Freq: Once | INTRAMUSCULAR | Status: AC
  Administered 2011-05-29: 4 mg via INTRAVENOUS
  Filled 2011-05-29: qty 2

## 2011-05-29 MED ORDER — FENTANYL CITRATE 0.05 MG/ML IJ SOLN: 25.0000 ug | INTRAMUSCULAR | Status: DC | PRN

## 2011-05-29 MED ORDER — KETOROLAC TROMETHAMINE 30 MG/ML IJ SOLN
30.0000 mg | Freq: Once | INTRAMUSCULAR | Status: AC
  Administered 2011-05-29: 30 mg via INTRAVENOUS
  Filled 2011-05-29: qty 1

## 2011-05-29 MED ORDER — LACTATED RINGERS IV SOLN: INTRAVENOUS | Status: DC

## 2011-05-29 MED ORDER — LIDOCAINE HCL 2 % EX GEL
CUTANEOUS | Status: DC | PRN
  Administered 2011-05-29: 1 via URETHRAL

## 2011-05-29 MED ORDER — CEFAZOLIN SODIUM 1-5 GM-% IV SOLN
1.0000 g | INTRAVENOUS | Status: AC
  Administered 2011-05-29: 1 g via INTRAVENOUS

## 2011-05-29 MED ORDER — LIDOCAINE HCL (CARDIAC) 20 MG/ML IV SOLN
INTRAVENOUS | Status: DC | PRN
  Administered 2011-05-29: 100 mg via INTRAVENOUS

## 2011-05-29 MED ORDER — CARBIDOPA-LEVODOPA 25-100 MG PO TABS
0.5000 | ORAL_TABLET | Freq: Three times a day (TID) | ORAL | Status: DC
  Filled 2011-05-29 (×4): qty 0.5

## 2011-05-29 MED ORDER — LACTATED RINGERS IV SOLN
INTRAVENOUS | Status: DC | PRN
  Administered 2011-05-29: 20:00:00 via INTRAVENOUS

## 2011-05-29 MED ORDER — SODIUM CHLORIDE 0.9 % IR SOLN
Status: DC | PRN
  Administered 2011-05-29: 3000 mL

## 2011-05-29 MED ORDER — EPHEDRINE SULFATE 50 MG/ML IJ SOLN
INTRAMUSCULAR | Status: DC | PRN
  Administered 2011-05-29 (×2): 10 mg via INTRAVENOUS

## 2011-05-29 MED ORDER — PROMETHAZINE HCL 25 MG/ML IJ SOLN: 6.2500 mg | INTRAMUSCULAR | Status: DC | PRN

## 2011-05-29 MED ORDER — MUPIROCIN 2 % EX OINT
TOPICAL_OINTMENT | CUTANEOUS | Status: AC
  Filled 2011-05-29: qty 22

## 2011-05-29 MED ORDER — TAMSULOSIN HCL 0.4 MG PO CAPS
0.4000 mg | ORAL_CAPSULE | Freq: Every day | ORAL | Status: DC
  Filled 2011-05-29: qty 1

## 2011-05-29 SURGICAL SUPPLY — 12 items
BAG URO CATCHER STRL LF (DRAPE) ×3 IMPLANT
CATH URET 5FR 28IN OPEN ENDED (CATHETERS) ×3 IMPLANT
CLOTH BEACON ORANGE TIMEOUT ST (SAFETY) ×3 IMPLANT
DRAPE CAMERA CLOSED 9X96 (DRAPES) ×3 IMPLANT
GLOVE SURG SS PI 8.0 STRL IVOR (GLOVE) ×3 IMPLANT
GOWN PREVENTION PLUS XLARGE (GOWN DISPOSABLE) ×3 IMPLANT
GOWN STRL REIN XL XLG (GOWN DISPOSABLE) ×3 IMPLANT
MANIFOLD NEPTUNE II (INSTRUMENTS) ×3 IMPLANT
MARKER SKIN DUAL TIP RULER LAB (MISCELLANEOUS) ×3 IMPLANT
PACK CYSTO (CUSTOM PROCEDURE TRAY) ×3 IMPLANT
SHEATH URET ACCESS 12FR/35CM (UROLOGICAL SUPPLIES) ×2 IMPLANT
TUBING CONNECTING 10 (TUBING) ×3 IMPLANT

## 2011-05-29 NOTE — ED Provider Notes (Addendum)
History     CSN: [id number]  Arrival date & time 05/29/11  0118   First MD Initiated Contact with Patient 05/29/11 0147      Chief Complaint  Patient presents with  . Nephrolithiasis    Chest Pain, SOB    (Consider location/radiation/quality/duration/timing/severity/associated sxs/prior treatment) HPI [name] is a 71 y.o. male presents with c/o  left flank pain leading to desire to be assessed in the ED. The sx(s) have been present for  several days. Additional concerns are nothing. Causative factors are known left ureter stone. Palliative factors are narcotic pain medicine (is not helping). The distress associated is moderate. The disorder has been present for several days.   Past Medical History  Diagnosis Date  . Kidney stones   . Parkinson disease   . GERD (gastroesophageal reflux disease)   . Aortic stenosis   . Anxiety   . Headache     History reviewed. No pertinent past surgical history.  No family history on file.  History  Substance Use Topics  . Smoking status: Not on file  . Smokeless tobacco: Not on file  . Alcohol Use:       Review of Systems  All other systems reviewed and are negative.    Allergies  Vicodin  Home Medications   Current Outpatient Rx  Name Route Sig Dispense Refill  . ACETAMINOPHEN 500 MG PO TABS Oral Take 1,000 mg by mouth as needed. For pain    . VANQUISH PO Oral Take by mouth.    . ASPIRIN 325 MG PO TABS Oral Take 325 mg by mouth daily.    Marland Kitchen BUTALBITAL-APAP-CAFFEINE 50-500-40 MG PO TABS Oral Take 1 tablet by mouth every 4 (four) hours as needed. For headaches    . CARBIDOPA-LEVODOPA 25-100 MG PO TABS Oral Take 0.5 tablets by mouth 3 (three) times daily.    Marland Kitchen CLONAZEPAM 1 MG PO TABS Oral Take 1 mg by mouth at bedtime.    . OMEGA-3 FATTY ACIDS 1000 MG PO CAPS Oral Take 1 g by mouth daily.    Marland Kitchen LORAZEPAM 0.5 MG PO TABS Oral Take 0.5 mg by mouth daily as needed. For tension    . MELOXICAM 7.5 MG PO TABS Oral Take 7.5  mg by mouth daily as needed. For hip pain/arthritis    . OMEPRAZOLE 20 MG PO CPDR Oral Take 20 mg by mouth daily.    Marland Kitchen OVER THE COUNTER MEDICATION Oral Take 1 capsule by mouth 2 (two) times daily. Vitamin D 800 IU    . OXYCODONE-ACETAMINOPHEN 5-325 MG PO TABS Oral Take 1-2 tablets by mouth every 6 (six) hours as needed for pain. 25 tablet 0  . ROPINIROLE HCL ER 4 MG PO TB24 Oral Take 4 mg by mouth daily.    Marland Kitchen TAMSULOSIN HCL 0.4 MG PO CAPS Oral Take 1 capsule (0.4 mg total) by mouth daily. 7 capsule 0  . TRAMADOL HCL 50 MG PO TABS Oral Take 50 mg by mouth daily as needed. For pain    . VITAMIN E 400 UNITS PO CAPS Oral Take 400 Units by mouth daily.      BP 130/74  Pulse 90  Temp(Src) 97.9 F (36.6 C) (Oral)  Resp 20  SpO2 95%  Physical Exam  Nursing note and vitals reviewed. Constitutional: He is oriented to person, place, and time. He appears well-developed and well-nourished.  HENT:  Head: Normocephalic and atraumatic.  Right Ear: External ear normal.  Left Ear: External ear normal.  Eyes: Conjunctivae and EOM are normal. Pupils are equal, round, and reactive to light.  Neck: Normal range of motion and phonation normal. Neck supple.  Cardiovascular: Normal rate, regular rhythm, normal heart sounds and intact distal pulses.   Pulmonary/Chest: Effort normal and breath sounds normal. He exhibits no bony tenderness.  Abdominal: Soft. Normal appearance. There is no tenderness (Mild left upper and lower quad, tenderness).  Musculoskeletal: Normal range of motion.  Neurological: He is alert and oriented to person, place, and time. He has normal strength. No cranial nerve deficit or sensory deficit.       Speaks haltingly (baseline, related to Parkinsons)  Skin: Skin is warm, dry and intact.  Psychiatric: He has a normal mood and affect. His behavior is normal.    ED Course  Procedures (including critical care time)  Emergency Department Treatment: IV Toradol and Zofran. Pt is  comfortable now.    Date: 05/29/2011  Rate: 85  Rhythm: normal sinus rhythm  QRS Axis: normal  Intervals: normal  ST/T Wave abnormalities: normal  Conduction Disutrbances:none  Narrative Interpretation: new PVC and left atrial enlargement  Old EKG Reviewed: changes noted        Labs Reviewed - No data to display Ct Abdomen Pelvis Wo Contrast  05/28/2011  *RADIOLOGY REPORT*  Clinical Data: Left flank pain, radiating to the groin and left lower quadrant.  CT ABDOMEN AND PELVIS WITHOUT CONTRAST  Technique:  Multidetector CT imaging of the abdomen and pelvis was performed following the standard protocol without intravenous contrast.  Comparison: CT of the abdomen and pelvis performed 02/27/2011  Findings: Minimal bibasilar atelectasis is noted.  Calcification is noted at the aortic and mitral valves.  A 5.8 cm cyst is noted arising at the left hepatic lobe.  The liver is otherwise unremarkable in appearance.  The spleen is within normal limits.  The pancreas and adrenal glands are unremarkable.  There is very mild left-sided hydronephrosis, with asymmetric left- sided perinephric stranding and fluid, and prominence of the left ureter to the level of an obstructing 5 x 5 mm stone in the distal left ureter, 1 cm proximal to the left vesicoureteral junction.  Nonspecific right-sided perinephric stranding is also noted.  A few tiny nonobstructing renal stones are seen bilaterally, measuring up to 2 mm in size.  No free fluid is identified.  The small bowel is unremarkable in appearance.  The stomach is within normal limits.  No acute vascular abnormalities are seen.  Minimal calcification is noted along the distal abdominal aorta and its branches.  The appendix is normal in caliber, without evidence for appendicitis.  The colon is largely filled with stool and is unremarkable in appearance.  The bladder is mildly distended; the diffuse soft tissue inflammation about the bladder raises concern for  cystitis.  The prostate is mildly enlarged, measuring 5.2 cm in transverse dimension.  Small bilateral inguinal hernias are noted, containing only fat.  Small bowel abuts the right inguinal hernia, without evidence of significant bowel herniation.  No inguinal lymphadenopathy is seen.  No acute osseous abnormalities are identified.  Mild vacuum phenomenon is noted along the lower thoracic and lumbar spine, with associated disc space narrowing.  IMPRESSION:  1.  Very mild left-sided hydronephrosis, with an obstructing 5 x 5 mm stone in the distal left ureter, 1 cm proximal to the left vesicoureteral junction.  2.  Diffuse soft tissue inflammation about the bladder raises concern for cystitis. 3.  Few tiny nonobstructing bilateral renal stones seen, measuring  up to 2 mm in size. 4.  Small bilateral inguinal hernias, containing only fat. 5.  Mildly enlarged prostate. 6.  5.8 cm left hepatic cyst noted. 7.  Calcification noted at the aortic and mitral valves.  Original Report Authenticated By: Tonia Ghent, M.D.     1. Renal colic on left side   2. Ureteral calculus, left       MDM  The patient has a low-lying ureteral stone. That has a good chance of passing with symptomatic treatment.        Flint Melter, MD 05/29/11 9147  Flint Melter, MD 05/29/11 [phone]

## 2011-05-29 NOTE — Interval H&P Note (Signed)
History and Physical Interval Note:  05/29/2011 8:08 PM  [name]  has presented today for surgery, with the diagnosis of left ureteral stone  The various methods of treatment have been discussed with the patient and family. After consideration of risks, benefits and other options for treatment, the patient has consented to  Procedure(s): CYSTOSCOPY WITH RETROGRADE PYELOGRAM, URETEROSCOPY AND STENT PLACEMENT HOLMIUM LASER APPLICATION as a surgical intervention .  The patients' history has been reviewed, patient examined, no change in status, stable for surgery.  I have reviewed the patients' chart and labs.  Questions were answered to the patient's satisfaction.     [name]  He was seen today with recurrent pain and a repeat CT showed a persistent left distal stone with some obstruction.   The stone was not seen on KUB.   He continues to have pain and we will proceed with ureteroscopy tonight and noted above.

## 2011-05-29 NOTE — Anesthesia Postprocedure Evaluation (Signed)
  Anesthesia Post-op Note  Patient: [name]  Procedure(s) Performed:  CYSTOSCOPY WITH RETROGRADE PYELOGRAM, URETEROSCOPY AND STENT PLACEMENT - no stent   Patient Location: PACU  Anesthesia Type: General  Level of Consciousness: awake and alert   Airway and Oxygen Therapy: Patient Spontanous Breathing  Post-op Pain: mild  Post-op Assessment: Post-op Vital signs reviewed, Patient's Cardiovascular Status Stable, Respiratory Function Stable, Patent Airway and No signs of Nausea or vomiting  Post-op Vital Signs: stable  Complications: No apparent anesthesia complications

## 2011-05-29 NOTE — ED Notes (Signed)
Spoke with edp about decreased pain.  Pt will continued to be evaluated.

## 2011-05-29 NOTE — Transfer of Care (Signed)
Immediate Anesthesia Transfer of Care Note  Patient: [name]  Procedure(s) Performed:  CYSTOSCOPY WITH RETROGRADE PYELOGRAM, URETEROSCOPY AND STENT PLACEMENT - no stent   Patient Location: PACU  Anesthesia Type: General  Level of Consciousness: sedated, patient cooperative and responds to stimulaton  Airway & Oxygen Therapy: Patient Spontanous Breathing and Patient connected to face mask oxgen  Post-op Assessment: Report given to PACU RN and Post -op Vital signs reviewed and stable  Post vital signs: Reviewed and stable  Complications: No apparent anesthesia complications

## 2011-05-29 NOTE — Anesthesia Preprocedure Evaluation (Addendum)
Anesthesia Evaluation  Patient identified by MRN, date of birth, ID band Patient awake    Reviewed: Allergy & Precautions, H&P , NPO status , Patient's Chart, lab work & pertinent test results  Airway Mallampati: II TM Distance: >3 FB Neck ROM: full    Dental  (+) Poor Dentition and Chipped   Pulmonary neg pulmonary ROS,  clear to auscultation  Pulmonary exam normal       Cardiovascular Exercise Tolerance: Good neg cardio ROS + Valvular Problems/Murmurs AS regular Normal+ Systolic murmurs    Neuro/Psych  Headaches, Parkinson's disease TIA Neuromuscular disease Negative Neurological ROS  Negative Psych ROS   GI/Hepatic negative GI ROS, Neg liver ROS, GERD-  Medicated and Controlled,  Endo/Other  Negative Endocrine ROS  Renal/GU negative Renal ROS  Genitourinary negative   Musculoskeletal   Abdominal   Peds  Hematology negative hematology ROS (+)   Anesthesia Other Findings   Reproductive/Obstetrics negative OB ROS                         Anesthesia Physical Anesthesia Plan  ASA: III  Anesthesia Plan: General   Post-op Pain Management:    Induction: Intravenous  Airway Management Planned: LMA  Additional Equipment:   Intra-op Plan:   Post-operative Plan:   Informed Consent: I have reviewed the patients History and Physical, chart, labs and discussed the procedure including the risks, benefits and alternatives for the proposed anesthesia with the patient or authorized representative who has indicated his/her understanding and acceptance.   Dental Advisory Given  Plan Discussed with: CRNA and Surgeon  Anesthesia Plan Comments:         Anesthesia Quick Evaluation

## 2011-05-29 NOTE — ED Notes (Signed)
Pt arrived vis ems with wife.  Pt is complaining of increased flank pain from a diagnosed kidney stone.

## 2011-05-29 NOTE — Brief Op Note (Signed)
05/29/2011  9:09 PM  PATIENT:  [name]  71 y.o. male  PRE-OPERATIVE DIAGNOSIS:  left ureteral stone  POST-OPERATIVE DIAGNOSIS:  left ureteral stone  PROCEDURE:  Procedure(s): CYSTOSCOPY WITH RETROGRADE PYELOGRAM, URETEROSCOPIC STONE EXTRACTION.  SURGEON:  Surgeon(s): Anner Crete  PHYSICIAN ASSISTANT:   ASSISTANTS: none   ANESTHESIA:   general  EBL:     BLOOD ADMINISTERED:none  DRAINS: NONE   LOCAL MEDICATIONS USED:  LIDOCAINE jelly 10CC  SPECIMEN:  Source of Specimen:  stone   DISPOSITION OF SPECIMEN:  given to family to bring to office.  COUNTS:  YES  TOURNIQUET:  * No tourniquets in log *  DICTATION: .Other Dictation: Dictation Number [id number]  PLAN OF CARE: Admit for overnight observation  PATIENT DISPOSITION:  PACU - hemodynamically stable.   Delay start of Pharmacological VTE agent (>24hrs) due to surgical blood loss or risk of bleeding:  {YES/NO/NOT APPLICABLE:20182

## 2011-05-29 NOTE — H&P (Signed)
Reason For Visit  Follow up distal left ureteral stone   History of Present Illness  71 y/o white male with recently diagnosed left distal ureteral stone presents for continued evaluation.  He was evaluated in our office initially 03/28/11 for follow up with KUB which failed to demonstrate any definate persistent ureteral stone.  Detailed history can be found in Dr. Ellin Goodie note from 03/28/11.  He still has not seen the stone pass, but has only had one episode of very mild LLQ abdominal discomfort (1-2/10 in severity) since we last saw him.  He denies all associated LUTS- specifically denies gross hematuria, dysuria, flank pain, increased frequency, urgency, fever or chills.   Past Medical History Problems  1. History of  Abdominal Aorta Stenosis 440.0 2. History of  Aortic Stenosis 424.1 3. History of  Arthritis V13.4 4. History of  Bicuspid Aortic Valve 746.4 5. History of  Cholelithiasis 574.20 6. History of  Esophageal Reflux 530.81 7. History of  Headache 784.0 8. History of  Heart Disease 429.9 9. History of  Murmurs 785.2 10. History of  Parkinson's Disease 332.0 11. History of  Sleep Disturbances 780.50 12. History of  Stroke Syndrome 436 13. History of  Transient Ischemic Attack 435.9  Current Meds 1. Aspirin 325 MG Oral Tablet; Therapy: (Recorded:13Nov2012) to 2. Butalbital-APAP-Caffeine 50-325-40 MG Oral Tablet; Therapy: (Recorded:13Nov2012) to 3. Carbidopa-Levodopa 25-100 MG Oral Tablet Dispersible; Therapy: (Recorded:13Nov2012) to 4. ClonazePAM 1 MG Oral Tablet; Therapy: (Recorded:13Nov2012) to 5. LORazepam 0.5 MG Oral Tablet; Therapy: (Recorded:13Nov2012) to 6. Meloxicam 7.5 MG Oral Tablet; Therapy: (Recorded:13Nov2012) to 7. Omega 3 CAPS; Therapy: (Recorded:13Nov2012) to 8. Omeprazole TBEC; Therapy: (Recorded:13Nov2012) to 9. Requip XL TB24; Therapy: (Recorded:13Nov2012) to 10. Tamsulosin HCl 0.4 MG Oral Capsule; Therapy: (Recorded:13Nov2012) to 11. TraMADol HCl 50  MG Oral Tablet; Therapy: (Recorded:13Nov2012) to 12. Tylenol TABS; Therapy: (Recorded:13Nov2012) to 13. Vanquish TABS; Therapy: (Recorded:13Nov2012) to 14. Vitamin D TABS; Therapy: (Recorded:13Nov2012) to 15. Vitamin E 400 UNIT Oral Capsule; Therapy: (Recorded:13Nov2012) to  Allergies Medication  1. Vicodin TABS  Family History Problems  1. Maternal history of  Arthritis V17.7 2. Sororal history of  Arthritis V17.7 3. Family history of  Family Health Status Number Of Children 4; 2 sons and 2 daughters 4. Family history of  Father Deceased At Age ____ 57: Exact cause unknown 5. Family history of  Mother Deceased At Age ____ 20: Related to Rheumatoid Arthritis 6. Paternal history of  Pacemaker Placement 7. Paternal history of  Prostate Cancer V16.42 8. Maternal history of  Rheumatoid Arthritis  Social History Problems  1. Marital History - Currently Married Elizabeth 2. Never A Smoker Denied  3. History of  Alcohol Use 4. History of  Caffeine Use 5. History of  Occupation: Retired  Event organiser, constitutional, skin, eye, otolaryngeal, hematologic/lymphatic, cardiovascular, pulmonary, endocrine, musculoskeletal, gastrointestinal, neurological and psychiatric system(s) were reviewed and pertinent findings if present are noted.  Genitourinary: urinary frequency and nocturia.    Vitals Vital Signs [Data Includes: Last 1 Day]  13Dec2012 11:00AM  BMI Calculated: 28.35 BSA Calculated: 1.78 Weight: 162 lb  Blood Pressure: 109 / 67 Temperature: 98.8 F Heart Rate: 62  Physical Exam Constitutional: Well nourished and well developed . No acute distress.  ENT:. The ears and nose are normal in appearance.  Neck: The appearance of the neck is normal and no neck mass is present.  Pulmonary: No respiratory distress and normal respiratory rhythm and effort.  Cardiovascular:. No peripheral edema.  Abdomen: No CVA tenderness.  Skin: Normal skin turgor, no visible  rash and no visible skin lesions.  Neuro/Psych:. Mood and affect are appropriate.    Results/Data Urine [Data Includes: Last 1 Day]   13Dec2012  COLOR YELLOW   APPEARANCE CLEAR   SPECIFIC GRAVITY 1.015   pH 5.5   GLUCOSE NEG mg/dL  BILIRUBIN NEG   KETONE NEG mg/dL  BLOOD NEG   PROTEIN NEG mg/dL  UROBILINOGEN 0.2 mg/dL  NITRITE NEG   LEUKOCYTE ESTERASE NEG    The following clinical lab reports were reviewed: Marland Kitchen  Urinalysis Selected Results  UA With REFLEX 13Dec2012 10:44AM Bruning, Ashlyn   Test Name Result Flag Reference  COLOR YELLOW  YELLOW  APPEARANCE CLEAR  CLEAR  SPECIFIC GRAVITY 1.015  1.005-1.030  pH 5.5  5.0-8.0  GLUCOSE NEG mg/dL  NEG  BILIRUBIN NEG  NEG  KETONE NEG mg/dL  NEG  BLOOD NEG  NEG  PROTEIN NEG mg/dL  NEG  UROBILINOGEN 0.2 mg/dL  1.6-1.0  NITRITE NEG  NEG  LEUKOCYTE ESTERASE NEG  NEG   Assessment Assessed  1. Nephrolithiasis 592.0 2. Ureteral Stone 592.1  Plan Health Maintenance (V70.0)  1. UA With REFLEX  Done: 13Dec2012 10:44AM Ureteral Stone (592.1)  2. Follow-up Month x 2 Office  Follow-up  Requested for: 28Feb2013   Patient prefers to continue to monitor his sxs and defer repeat CTU today as he has not had any significant pain. His urinalysis is crystal clear today. I will see him back in 2-3 months for follow up and further assessment to be determined at that time.

## 2011-05-29 NOTE — ED Notes (Signed)
Pt not in acute pain, discharge instructions reviewed, follow up reviewed

## 2011-05-30 NOTE — Op Note (Signed)
NAME:  CARVEL, HUSKINS NO.:  [id number]  MEDICAL RECORD NO.:  [id number]  LOCATION:                               FACILITY:  [facility]  PHYSICIAN:  Excell Seltzer. Annabell Howells, M.D.    DATE OF BIRTH:  Apr 07, 1941  DATE OF PROCEDURE:  05/29/2011 DATE OF DISCHARGE:  05/29/2011                              OPERATIVE REPORT   PROCEDURE: 1. Cystoscopy. 2. Left retrograde pyelogram with interpretation. 3. Left ureteroscopic stone extraction.  PREOPERATIVE DIAGNOSIS:  Left distal ureteral stone.  POSTOPERATIVE DIAGNOSIS:  Left distal ureteral stone.  SURGEON:  Excell Seltzer. Annabell Howells, M.D.  ANESTHESIA:  General.  DRAINS:  None.  SPECIMENS:  Ureteral stone.  COMPLICATIONS:  None.  INDICATIONS:  Mr. Grajales is a 71 year old white male with an approximately 5-mm left distal ureteral stone.  He has failed to pass the stone despite a month of observation.  He has continued to have symptoms and has elected to undergo ureteroscopy today.  FINDINGS OF PROCEDURE:  He was given a gram of Ancef, was taken to the operating room where he was fitted with a PAS hose.  General anesthetic was induced.  He was placed in lithotomy position.  His perineum and genitalia were prepped with Betadine solution and he was draped in usual sterile fashion.  Cystoscopy was performed using a 22-French scope and 12 and 70 degree lenses.  Examination revealed a normal urethra.  The external sphincter was intact.  The prostatic urethra was approximately 2 cm in length with trilobar hyperplasia without large middle lobe with obstruction. There was mild trabeculation.  No tumors or stones were noted.  The ureteral orifices were unremarkable.  The left ureteral orifice was cannulated with 5-French open-end catheter and contrast was instilled.  A left retrograde pyelogram revealed a filling defect in the distal ureter consistent with a stone.  There was narrowing of the intramural ureter with proximal dilation from  where the stone had been impacted.  A guidewire was then passed to the kidney under fluoroscopic guidance and the cystoscope was removed.  A 12-French access sheath inner core dilator was passed over the wire and up the ureter by the stone.  A 6.4-French rigid ureteroscope was then passed alongside the wire.  The stone was visualized and grasped with a Nitinol basket and removed intact.  Reinspection of the ureter revealed no significant trauma or active bleeding, so it was felt a stent was not required.  The bladder was drained.  The guidewire was removed.  Urethra was instilled with 10 cc 2% lidocaine jelly.  The patient was taken down from lithotomy position.  His anesthetic was reversed.  He was moved to recovery room in stable condition.  There were no complications.     Excell Seltzer. Annabell Howells, M.D.     JJW/MEDQ  D:  05/29/2011  T:  05/30/2011  Job:  [id number]  cc:   Valetta Fuller, MD Fax: [phone]

## 2011-06-07 DIAGNOSIS — N201 Calculus of ureter: Secondary | ICD-10-CM | POA: Diagnosis not present

## 2011-06-07 DIAGNOSIS — N200 Calculus of kidney: Secondary | ICD-10-CM | POA: Diagnosis not present

## 2011-06-12 ENCOUNTER — Encounter (HOSPITAL_COMMUNITY): Payer: Self-pay | Admitting: Urology

## 2011-07-10 DIAGNOSIS — I359 Nonrheumatic aortic valve disorder, unspecified: Secondary | ICD-10-CM | POA: Diagnosis not present

## 2011-07-10 DIAGNOSIS — G20 Parkinson's disease: Secondary | ICD-10-CM | POA: Diagnosis not present

## 2011-08-24 DIAGNOSIS — Z85828 Personal history of other malignant neoplasm of skin: Secondary | ICD-10-CM | POA: Diagnosis not present

## 2011-08-24 DIAGNOSIS — D235 Other benign neoplasm of skin of trunk: Secondary | ICD-10-CM | POA: Diagnosis not present

## 2011-08-24 DIAGNOSIS — L570 Actinic keratosis: Secondary | ICD-10-CM | POA: Diagnosis not present

## 2011-10-05 DIAGNOSIS — Z961 Presence of intraocular lens: Secondary | ICD-10-CM | POA: Diagnosis not present

## 2011-10-05 DIAGNOSIS — H26499 Other secondary cataract, unspecified eye: Secondary | ICD-10-CM | POA: Diagnosis not present

## 2011-10-05 DIAGNOSIS — H524 Presbyopia: Secondary | ICD-10-CM | POA: Diagnosis not present

## 2011-10-05 DIAGNOSIS — H04129 Dry eye syndrome of unspecified lacrimal gland: Secondary | ICD-10-CM | POA: Diagnosis not present

## 2011-10-05 DIAGNOSIS — H538 Other visual disturbances: Secondary | ICD-10-CM | POA: Diagnosis not present

## 2011-10-23 DIAGNOSIS — G20 Parkinson's disease: Secondary | ICD-10-CM | POA: Diagnosis not present

## 2011-12-21 DIAGNOSIS — M25559 Pain in unspecified hip: Secondary | ICD-10-CM | POA: Diagnosis not present

## 2012-03-06 DIAGNOSIS — Z23 Encounter for immunization: Secondary | ICD-10-CM | POA: Diagnosis not present

## 2012-03-11 DIAGNOSIS — L570 Actinic keratosis: Secondary | ICD-10-CM | POA: Diagnosis not present

## 2012-04-29 DIAGNOSIS — G20 Parkinson's disease: Secondary | ICD-10-CM | POA: Diagnosis not present

## 2012-04-30 DIAGNOSIS — I359 Nonrheumatic aortic valve disorder, unspecified: Secondary | ICD-10-CM | POA: Diagnosis not present

## 2012-04-30 DIAGNOSIS — G20 Parkinson's disease: Secondary | ICD-10-CM | POA: Diagnosis not present

## 2012-06-11 DIAGNOSIS — H2510 Age-related nuclear cataract, unspecified eye: Secondary | ICD-10-CM | POA: Diagnosis not present

## 2012-07-17 DIAGNOSIS — R109 Unspecified abdominal pain: Secondary | ICD-10-CM | POA: Diagnosis not present

## 2012-09-02 DIAGNOSIS — I359 Nonrheumatic aortic valve disorder, unspecified: Secondary | ICD-10-CM | POA: Diagnosis not present

## 2012-09-02 DIAGNOSIS — K4090 Unilateral inguinal hernia, without obstruction or gangrene, not specified as recurrent: Secondary | ICD-10-CM | POA: Diagnosis not present

## 2012-09-02 DIAGNOSIS — G20 Parkinson's disease: Secondary | ICD-10-CM | POA: Diagnosis not present

## 2012-10-08 DIAGNOSIS — L570 Actinic keratosis: Secondary | ICD-10-CM | POA: Diagnosis not present

## 2012-10-08 DIAGNOSIS — L820 Inflamed seborrheic keratosis: Secondary | ICD-10-CM | POA: Diagnosis not present

## 2012-10-10 DIAGNOSIS — H26499 Other secondary cataract, unspecified eye: Secondary | ICD-10-CM | POA: Diagnosis not present

## 2012-10-10 DIAGNOSIS — Z961 Presence of intraocular lens: Secondary | ICD-10-CM | POA: Diagnosis not present

## 2012-10-10 DIAGNOSIS — H04209 Unspecified epiphora, unspecified lacrimal gland: Secondary | ICD-10-CM | POA: Diagnosis not present

## 2012-10-10 DIAGNOSIS — H02839 Dermatochalasis of unspecified eye, unspecified eyelid: Secondary | ICD-10-CM | POA: Diagnosis not present

## 2012-10-28 DIAGNOSIS — G20 Parkinson's disease: Secondary | ICD-10-CM | POA: Diagnosis not present

## 2012-11-18 ENCOUNTER — Ambulatory Visit: Payer: Medicare Other

## 2012-12-06 ENCOUNTER — Ambulatory Visit: Payer: Medicare Other | Attending: Neurology

## 2013-01-06 DIAGNOSIS — I359 Nonrheumatic aortic valve disorder, unspecified: Secondary | ICD-10-CM | POA: Diagnosis not present

## 2013-01-06 DIAGNOSIS — G20 Parkinson's disease: Secondary | ICD-10-CM | POA: Diagnosis not present

## 2013-01-06 DIAGNOSIS — K4090 Unilateral inguinal hernia, without obstruction or gangrene, not specified as recurrent: Secondary | ICD-10-CM | POA: Diagnosis not present

## 2013-01-23 DIAGNOSIS — N390 Urinary tract infection, site not specified: Secondary | ICD-10-CM | POA: Diagnosis not present

## 2013-02-04 DIAGNOSIS — G20 Parkinson's disease: Secondary | ICD-10-CM | POA: Diagnosis not present

## 2013-02-04 DIAGNOSIS — Z5189 Encounter for other specified aftercare: Secondary | ICD-10-CM | POA: Diagnosis not present

## 2013-02-04 DIAGNOSIS — Z602 Problems related to living alone: Secondary | ICD-10-CM | POA: Diagnosis not present

## 2013-02-04 DIAGNOSIS — I359 Nonrheumatic aortic valve disorder, unspecified: Secondary | ICD-10-CM | POA: Diagnosis not present

## 2013-02-04 DIAGNOSIS — I2789 Other specified pulmonary heart diseases: Secondary | ICD-10-CM | POA: Diagnosis not present

## 2013-02-05 DIAGNOSIS — Z5189 Encounter for other specified aftercare: Secondary | ICD-10-CM | POA: Diagnosis not present

## 2013-02-05 DIAGNOSIS — G20 Parkinson's disease: Secondary | ICD-10-CM | POA: Diagnosis not present

## 2013-02-05 DIAGNOSIS — I359 Nonrheumatic aortic valve disorder, unspecified: Secondary | ICD-10-CM | POA: Diagnosis not present

## 2013-02-05 DIAGNOSIS — I2789 Other specified pulmonary heart diseases: Secondary | ICD-10-CM | POA: Diagnosis not present

## 2013-02-05 DIAGNOSIS — Z602 Problems related to living alone: Secondary | ICD-10-CM | POA: Diagnosis not present

## 2013-02-06 DIAGNOSIS — Z602 Problems related to living alone: Secondary | ICD-10-CM | POA: Diagnosis not present

## 2013-02-06 DIAGNOSIS — I359 Nonrheumatic aortic valve disorder, unspecified: Secondary | ICD-10-CM | POA: Diagnosis not present

## 2013-02-06 DIAGNOSIS — I2789 Other specified pulmonary heart diseases: Secondary | ICD-10-CM | POA: Diagnosis not present

## 2013-02-06 DIAGNOSIS — G20 Parkinson's disease: Secondary | ICD-10-CM | POA: Diagnosis not present

## 2013-02-06 DIAGNOSIS — Z5189 Encounter for other specified aftercare: Secondary | ICD-10-CM | POA: Diagnosis not present

## 2013-02-10 DIAGNOSIS — I2789 Other specified pulmonary heart diseases: Secondary | ICD-10-CM | POA: Diagnosis not present

## 2013-02-10 DIAGNOSIS — Z602 Problems related to living alone: Secondary | ICD-10-CM | POA: Diagnosis not present

## 2013-02-10 DIAGNOSIS — I359 Nonrheumatic aortic valve disorder, unspecified: Secondary | ICD-10-CM | POA: Diagnosis not present

## 2013-02-10 DIAGNOSIS — Z5189 Encounter for other specified aftercare: Secondary | ICD-10-CM | POA: Diagnosis not present

## 2013-02-10 DIAGNOSIS — G20 Parkinson's disease: Secondary | ICD-10-CM | POA: Diagnosis not present

## 2013-02-11 DIAGNOSIS — Z602 Problems related to living alone: Secondary | ICD-10-CM | POA: Diagnosis not present

## 2013-02-11 DIAGNOSIS — Z5189 Encounter for other specified aftercare: Secondary | ICD-10-CM | POA: Diagnosis not present

## 2013-02-11 DIAGNOSIS — G20 Parkinson's disease: Secondary | ICD-10-CM | POA: Diagnosis not present

## 2013-02-11 DIAGNOSIS — I359 Nonrheumatic aortic valve disorder, unspecified: Secondary | ICD-10-CM | POA: Diagnosis not present

## 2013-02-11 DIAGNOSIS — I2789 Other specified pulmonary heart diseases: Secondary | ICD-10-CM | POA: Diagnosis not present

## 2013-02-12 DIAGNOSIS — Z5189 Encounter for other specified aftercare: Secondary | ICD-10-CM | POA: Diagnosis not present

## 2013-02-12 DIAGNOSIS — Z602 Problems related to living alone: Secondary | ICD-10-CM | POA: Diagnosis not present

## 2013-02-12 DIAGNOSIS — I2789 Other specified pulmonary heart diseases: Secondary | ICD-10-CM | POA: Diagnosis not present

## 2013-02-12 DIAGNOSIS — I359 Nonrheumatic aortic valve disorder, unspecified: Secondary | ICD-10-CM | POA: Diagnosis not present

## 2013-02-12 DIAGNOSIS — G20 Parkinson's disease: Secondary | ICD-10-CM | POA: Diagnosis not present

## 2013-02-14 DIAGNOSIS — I2789 Other specified pulmonary heart diseases: Secondary | ICD-10-CM | POA: Diagnosis not present

## 2013-02-14 DIAGNOSIS — I359 Nonrheumatic aortic valve disorder, unspecified: Secondary | ICD-10-CM | POA: Diagnosis not present

## 2013-02-14 DIAGNOSIS — Z602 Problems related to living alone: Secondary | ICD-10-CM | POA: Diagnosis not present

## 2013-02-14 DIAGNOSIS — Z5189 Encounter for other specified aftercare: Secondary | ICD-10-CM | POA: Diagnosis not present

## 2013-02-14 DIAGNOSIS — G20 Parkinson's disease: Secondary | ICD-10-CM | POA: Diagnosis not present

## 2013-02-17 DIAGNOSIS — I359 Nonrheumatic aortic valve disorder, unspecified: Secondary | ICD-10-CM | POA: Diagnosis not present

## 2013-02-17 DIAGNOSIS — G20 Parkinson's disease: Secondary | ICD-10-CM | POA: Diagnosis not present

## 2013-02-17 DIAGNOSIS — Z5189 Encounter for other specified aftercare: Secondary | ICD-10-CM | POA: Diagnosis not present

## 2013-02-17 DIAGNOSIS — Z602 Problems related to living alone: Secondary | ICD-10-CM | POA: Diagnosis not present

## 2013-02-17 DIAGNOSIS — I2789 Other specified pulmonary heart diseases: Secondary | ICD-10-CM | POA: Diagnosis not present

## 2013-02-21 DIAGNOSIS — I2789 Other specified pulmonary heart diseases: Secondary | ICD-10-CM | POA: Diagnosis not present

## 2013-02-21 DIAGNOSIS — G20 Parkinson's disease: Secondary | ICD-10-CM | POA: Diagnosis not present

## 2013-02-21 DIAGNOSIS — Z5189 Encounter for other specified aftercare: Secondary | ICD-10-CM | POA: Diagnosis not present

## 2013-02-21 DIAGNOSIS — I359 Nonrheumatic aortic valve disorder, unspecified: Secondary | ICD-10-CM | POA: Diagnosis not present

## 2013-02-21 DIAGNOSIS — Z602 Problems related to living alone: Secondary | ICD-10-CM | POA: Diagnosis not present

## 2013-02-25 DIAGNOSIS — Z602 Problems related to living alone: Secondary | ICD-10-CM | POA: Diagnosis not present

## 2013-02-25 DIAGNOSIS — G20 Parkinson's disease: Secondary | ICD-10-CM | POA: Diagnosis not present

## 2013-02-25 DIAGNOSIS — Z5189 Encounter for other specified aftercare: Secondary | ICD-10-CM | POA: Diagnosis not present

## 2013-02-25 DIAGNOSIS — I2789 Other specified pulmonary heart diseases: Secondary | ICD-10-CM | POA: Diagnosis not present

## 2013-02-25 DIAGNOSIS — I359 Nonrheumatic aortic valve disorder, unspecified: Secondary | ICD-10-CM | POA: Diagnosis not present

## 2013-03-12 DIAGNOSIS — Z23 Encounter for immunization: Secondary | ICD-10-CM | POA: Diagnosis not present

## 2013-05-01 DIAGNOSIS — K4090 Unilateral inguinal hernia, without obstruction or gangrene, not specified as recurrent: Secondary | ICD-10-CM | POA: Diagnosis not present

## 2013-05-01 DIAGNOSIS — R634 Abnormal weight loss: Secondary | ICD-10-CM | POA: Diagnosis not present

## 2013-05-01 DIAGNOSIS — G20 Parkinson's disease: Secondary | ICD-10-CM | POA: Diagnosis not present

## 2013-05-01 DIAGNOSIS — I359 Nonrheumatic aortic valve disorder, unspecified: Secondary | ICD-10-CM | POA: Diagnosis not present

## 2013-06-16 DIAGNOSIS — G20 Parkinson's disease: Secondary | ICD-10-CM | POA: Diagnosis not present

## 2013-07-16 ENCOUNTER — Ambulatory Visit: Payer: Medicare Other

## 2013-07-23 ENCOUNTER — Ambulatory Visit: Payer: Medicare Other | Attending: Neurology

## 2013-07-23 DIAGNOSIS — G20A1 Parkinson's disease without dyskinesia, without mention of fluctuations: Secondary | ICD-10-CM | POA: Insufficient documentation

## 2013-07-23 DIAGNOSIS — R471 Dysarthria and anarthria: Secondary | ICD-10-CM | POA: Diagnosis not present

## 2013-07-23 DIAGNOSIS — G20 Parkinson's disease: Secondary | ICD-10-CM | POA: Insufficient documentation

## 2013-07-23 DIAGNOSIS — IMO0001 Reserved for inherently not codable concepts without codable children: Secondary | ICD-10-CM | POA: Insufficient documentation

## 2013-07-25 ENCOUNTER — Ambulatory Visit: Payer: Medicare Other

## 2013-08-01 ENCOUNTER — Ambulatory Visit: Payer: Medicare Other

## 2013-08-04 ENCOUNTER — Ambulatory Visit: Payer: Medicare Other

## 2013-08-05 DIAGNOSIS — H6120 Impacted cerumen, unspecified ear: Secondary | ICD-10-CM | POA: Diagnosis not present

## 2013-08-06 DIAGNOSIS — H9190 Unspecified hearing loss, unspecified ear: Secondary | ICD-10-CM | POA: Diagnosis not present

## 2013-08-06 DIAGNOSIS — H6120 Impacted cerumen, unspecified ear: Secondary | ICD-10-CM | POA: Diagnosis not present

## 2013-08-07 ENCOUNTER — Ambulatory Visit: Payer: Medicare Other

## 2013-08-13 ENCOUNTER — Ambulatory Visit: Payer: Medicare Other

## 2013-08-20 DIAGNOSIS — R490 Dysphonia: Secondary | ICD-10-CM | POA: Diagnosis not present

## 2013-08-20 DIAGNOSIS — H9190 Unspecified hearing loss, unspecified ear: Secondary | ICD-10-CM | POA: Diagnosis not present

## 2013-08-20 DIAGNOSIS — H6120 Impacted cerumen, unspecified ear: Secondary | ICD-10-CM | POA: Diagnosis not present

## 2013-09-01 DIAGNOSIS — G20 Parkinson's disease: Secondary | ICD-10-CM | POA: Diagnosis not present

## 2013-09-01 DIAGNOSIS — I359 Nonrheumatic aortic valve disorder, unspecified: Secondary | ICD-10-CM | POA: Diagnosis not present

## 2013-09-01 DIAGNOSIS — Z79899 Other long term (current) drug therapy: Secondary | ICD-10-CM | POA: Diagnosis not present

## 2013-09-01 DIAGNOSIS — K4090 Unilateral inguinal hernia, without obstruction or gangrene, not specified as recurrent: Secondary | ICD-10-CM | POA: Diagnosis not present

## 2013-09-12 ENCOUNTER — Other Ambulatory Visit (HOSPITAL_COMMUNITY): Payer: Self-pay | Admitting: Internal Medicine

## 2013-09-12 ENCOUNTER — Ambulatory Visit (HOSPITAL_COMMUNITY): Payer: Medicare Other | Attending: Internal Medicine | Admitting: Cardiology

## 2013-09-12 DIAGNOSIS — I359 Nonrheumatic aortic valve disorder, unspecified: Secondary | ICD-10-CM | POA: Diagnosis not present

## 2013-09-12 DIAGNOSIS — Q231 Congenital insufficiency of aortic valve: Secondary | ICD-10-CM | POA: Diagnosis not present

## 2013-09-12 DIAGNOSIS — R0609 Other forms of dyspnea: Secondary | ICD-10-CM | POA: Insufficient documentation

## 2013-09-12 DIAGNOSIS — R0989 Other specified symptoms and signs involving the circulatory and respiratory systems: Secondary | ICD-10-CM | POA: Diagnosis not present

## 2013-09-12 NOTE — Progress Notes (Signed)
Echo performed. 

## 2013-09-25 ENCOUNTER — Ambulatory Visit (INDEPENDENT_AMBULATORY_CARE_PROVIDER_SITE_OTHER): Payer: Medicare Other | Admitting: Cardiology

## 2013-09-25 ENCOUNTER — Encounter: Payer: Self-pay | Admitting: Cardiology

## 2013-09-25 VITALS — BP 122/60 | HR 70 | Ht 65.0 in | Wt 146.0 lb

## 2013-09-25 DIAGNOSIS — I350 Nonrheumatic aortic (valve) stenosis: Secondary | ICD-10-CM

## 2013-09-25 DIAGNOSIS — I359 Nonrheumatic aortic valve disorder, unspecified: Secondary | ICD-10-CM

## 2013-09-25 NOTE — Progress Notes (Signed)
[name] [address], [address] [place], [place]  [address] Phone: [phone] Fax:  [phone]  Date:  09/25/2013   ID:  [name]., DOB 12-Sep-1940, MRN [id number]  PCP:  No primary provider on file.   History of Present Illness: [name]. is a 73 y.o. male here for the evaluation of severe aortic stenosis. Echocardiogram on 09/12/13 demonstrated peak and mean gradient of 157 and 93 mm of mercury respectively. Ejection fraction is 70%. Mild LVH. Aortic valve is thickened, calcified, prior history of bicuspid. Debbie dughter  Dr. Maxwell Caul is his primary physician referred. He has progressive Parkinson's disease, he is able to ambulate around house but is fairly limited in mobility. This was diagnosed in 2008.  He denies any chest pain, shortness of breath, syncope. Never smoked. No family history of CAD. Had prior TIA's (symptoms were causing before placing a key in door).      Wt Readings from Last 3 Encounters:  09/25/13 146 lb (66.225 kg)  05/29/11 161 lb (73.029 kg)  05/29/11 161 lb (73.029 kg)     Past Medical History  Diagnosis Date  . Kidney stones   . Parkinson disease   . GERD (gastroesophageal reflux disease)   . Aortic stenosis   . Anxiety   . Headache(784.0)   . TIA (transient ischemic attack) 02/08/2007  . Parkinson's disease (tremor, stiffness, slow motion, unstable posture) 12/11/2006    Past Surgical History  Procedure Laterality Date  . Cataract  2008    bilateral  . Cystoscopy/retrograde/ureteroscopy  05/29/2011    Procedure: CYSTOSCOPY/RETROGRADE/URETEROSCOPY;  Surgeon: Malka So, MD;  Location: [facility];  Service: Urology;  Laterality: Left;  no stent     Current Outpatient Prescriptions  Medication Sig Dispense Refill  . acetaminophen (TYLENOL) 500 MG tablet Take 1,000 mg by mouth as needed. For pain      . acetic acid-hydrocortisone (VOSOL-HC) otic solution       . ASA-APAP-Caff Buffered (VANQUISH PO) Take by mouth.      Marland Kitchen  aspirin 325 MG tablet Take 325 mg by mouth daily.      . butalbital-acetaminophen-caffeine (ESGIC PLUS) 50-500-40 MG per tablet Take 1 tablet by mouth every 4 (four) hours as needed. For headaches      . carbidopa-levodopa (SINEMET) 25-100 MG per tablet Take 0.5 tablets by mouth 3 (three) times daily.      . clonazePAM (KLONOPIN) 1 MG tablet Take 1 mg by mouth at bedtime.      . fish oil-omega-3 fatty acids 1000 MG capsule Take 1 g by mouth daily.      Marland Kitchen LORazepam (ATIVAN) 0.5 MG tablet Take 0.5 mg by mouth daily as needed. For tension      . meloxicam (MOBIC) 7.5 MG tablet Take 7.5 mg by mouth daily as needed. For hip pain/arthritis      . omeprazole (PRILOSEC) 20 MG capsule Take 20 mg by mouth daily.      Marland Kitchen OVER THE COUNTER MEDICATION Take 1 capsule by mouth 2 (two) times daily. Vitamin D 800 IU      . rOPINIRole (REQUIP XL) 4 MG 24 hr tablet Take 4 mg by mouth daily.      . sertraline (ZOLOFT) 50 MG tablet       . Tamsulosin HCl (FLOMAX) 0.4 MG CAPS Take 1 capsule (0.4 mg total) by mouth daily.  7 capsule  0  . traMADol (ULTRAM) 50 MG tablet Take 50  mg by mouth daily as needed. For pain      . vitamin E 400 UNIT capsule Take 400 Units by mouth daily.       No current facility-administered medications for this visit.    Allergies:    Allergies  Allergen Reactions  . Vicodin [Hydrocodone-Acetaminophen]     Social History:  The patient  reports that he has never smoked. He does not have any smokeless tobacco history on file. He reports that he does not drink alcohol or use illicit drugs.   No family history on file.  ROS:  Please see the history of present illness.  Denies any chest pain, syncope, significant shortness of breath.   All other systems reviewed and negative.   PHYSICAL EXAM: VS:  Ht 5\' 5"  (1.651 m)  Wt 146 lb (66.225 kg)  BMI 24.30 kg/m2 Well nourished, well developed, in no acute distress HEENT: normal, [place]/AT, EOMI Neck: no JVD, delayed carotid upstroke, + bruit  (murmur) B. Cardiac:  normal S1, S2; RRR; 3/6 systolic murmur with radiation to the carotids Lungs:  clear to auscultation bilaterally, no wheezing, rhonchi or rales Abd: soft, nontender, no hepatomegaly, no bruits Ext: no edema, 2+ distal pulses Skin: warm and dry GU: deferred Neuro: pleasant, slow speech, rigidity,  AAO x 3  EKG:  09/25/13 - normal rhythm, 63, nonspecific ST-T wave changes, possible lateral ischemia.  ASSESSMENT AND PLAN:  1. Severe aortic stenosis from bicuspid aortic valve- mean gradient 93 mm mercury. Not having significant shortness of breath however he is limited in mobility from moderate grade Parkinson's. He is able to work in garden, walk around the house. Dr. Maxwell Caul and I spoke. Question possible TAVR candidate. I also spoke with Dr. Burt Knack and he graciously will see him tomorrow in clinic to provide his expert opinion. We had lengthy discussion with patient's daughter, [name] and patient. There is concern that with his moderate lack of mobility secondary to Parkinson's disease that he may have trouble postoperatively with an open procedure. Risks and benefits need to be weighed. I discussed with them the natural progression of severe aortic stenosis, mortality. 2. I will see him back after Dr. Burt Knack sees him once we have a game plan in place. Please let me know.  Signed, Candee Furbish, MD [facility]  09/25/2013 1:51 PM

## 2013-09-25 NOTE — Patient Instructions (Signed)
Your physician recommends that you continue on your current medications as directed. Please refer to the Current Medication list given to you today.  You have been referred to Dr. Burt Knack. (Appointment @ 11:45 am on tomorrow).  Your physician recommends that you schedule a follow-up as needed with Dr. Magdalen Spatz.

## 2013-09-26 ENCOUNTER — Ambulatory Visit (INDEPENDENT_AMBULATORY_CARE_PROVIDER_SITE_OTHER): Payer: Medicare Other | Admitting: Cardiovascular Disease

## 2013-09-26 ENCOUNTER — Encounter: Payer: Self-pay | Admitting: Cardiovascular Disease

## 2013-09-26 VITALS — BP 120/66 | HR 68 | Ht 65.0 in | Wt 144.0 lb

## 2013-09-26 DIAGNOSIS — I350 Nonrheumatic aortic (valve) stenosis: Secondary | ICD-10-CM | POA: Insufficient documentation

## 2013-09-26 DIAGNOSIS — I359 Nonrheumatic aortic valve disorder, unspecified: Secondary | ICD-10-CM | POA: Diagnosis not present

## 2013-09-26 NOTE — Progress Notes (Signed)
HPI:  73 year old gentleman presenting for evaluation of severe aortic stenosis and consideration of transcatheter aortic valve replacement.  The patient was just evaluated by Dr. Marlou Porch who referred him for further discussion of treatment options.  The patient has a longstanding heart murmur for approximately 20 years and has been told he has a bicuspid aortic valve. Recently his murmur was noted to be increased in intensity and he was referred for an echocardiogram. This demonstrated critical aortic stenosis with peak and mean transaortic valve gradients of 157 and 93 mm Hg.   The patient is limited by Parkinson's Disease which has been present for several years. He has slowed speech and modestly limited physical activity, but is still able to ambulate with a cane. At times he walks without his cane. He reports fatigue and 'tiredness' with activity, but denies shortness of breath, chest pain, lightheadedness, or syncope. He denies leg swelling, orthopnea, or PND.   He lives alone after his wife passed away about 9 months ago. He's able to care for himself and perform all ADL's. He has 4 adult children who live around [place].   Outpatient Encounter Prescriptions as of 09/26/2013  Medication Sig  . acetaminophen (TYLENOL) 500 MG tablet Take 1,000 mg by mouth as needed. For pain  . acetic acid-hydrocortisone (VOSOL-HC) otic solution As needed  . ASA-APAP-Caff Buffered (VANQUISH PO) Take by mouth. As needed  . aspirin 325 MG tablet Take 325 mg by mouth daily.  . butalbital-acetaminophen-caffeine (ESGIC PLUS) 50-500-40 MG per tablet Take 1 tablet by mouth every 4 (four) hours as needed. For headaches  . carbidopa-levodopa (SINEMET) 25-100 MG per tablet Take 1.5 tablets by mouth 3 (three) times daily.   . cholecalciferol (VITAMIN D) 1000 UNITS tablet Take 1,000 Units by mouth daily.  . clonazePAM (KLONOPIN) 1 MG tablet Take 1.5 mg by mouth at bedtime.   . fish oil-omega-3 fatty acids 1000 MG  capsule Take 1 g by mouth daily.  Marland Kitchen LORazepam (ATIVAN) 0.5 MG tablet Take 0.5 mg by mouth daily as needed. For tension  . omeprazole (PRILOSEC) 20 MG capsule Take 20 mg by mouth daily.  . sertraline (ZOLOFT) 50 MG tablet Take 50 mg by mouth at bedtime.   . traMADol (ULTRAM) 50 MG tablet Take 50 mg by mouth daily as needed. For pain    Vicodin  Past Medical History  Diagnosis Date  . Kidney stones   . Parkinson disease   . GERD (gastroesophageal reflux disease)   . Aortic stenosis   . Anxiety   . Headache(784.0)   . TIA (transient ischemic attack) 02/08/2007  . Parkinson's disease (tremor, stiffness, slow motion, unstable posture) 12/11/2006    Past Surgical History  Procedure Laterality Date  . Cataract  2008    bilateral  . Cystoscopy/retrograde/ureteroscopy  05/29/2011    Procedure: CYSTOSCOPY/RETROGRADE/URETEROSCOPY;  Surgeon: Malka So, MD;  Location: [facility];  Service: Urology;  Laterality: Left;  no stent     History   Social History  . Marital Status: Married    Spouse Name: N/A    Number of Children: N/A  . Years of Education: N/A   Occupational History  . Not on file.   Social History Main Topics  . Smoking status: Never Smoker   . Smokeless tobacco: Not on file  . Alcohol Use: No  . Drug Use: No  . Sexual Activity:    Other Topics Concern  . Not on file   Social History Narrative  . No  narrative on file    Family History: negative for coronary or valvular heart disease  ROS:  General: no fevers/chills/night sweats, + fatigue Eyes: no blurry vision, diplopia, or amaurosis ENT: no sore throat or hearing loss Resp: no cough, wheezing, or hemoptysis CV: no edema or palpitations GI: no abdominal pain, nausea, vomiting, diarrhea, or constipation GU: no dysuria, frequency, or hematuria Skin: no rash Neuro: no headache, numbness, tingling, + weakness of extremities Musculoskeletal: no joint pain or swelling Heme: no bleeding, DVT, or easy  bruising Endo: no polydipsia or polyuria  BP 120/66  Pulse 68  Ht 5\' 5"  (1.651 m)  Wt 65.318 kg (144 lb)  BMI 23.96 kg/m2  PHYSICAL EXAM: Pt is alert and oriented, WD, WN, in no distress. Parkinsonian features present with slow deliberate speech. HEENT: normal Neck: JVP normal. Carotid upstrokes delayed without bruits. No thyromegaly. Lungs: equal expansion, clear bilaterally CV: Apex is discrete and nondisplaced, RRR with grade 3/6 systolic murmur at the RUSB Abd: soft, NT, +BS, no bruit, no hepatosplenomegaly Back: no CVA tenderness Ext: no C/C/E        DP/PT pulses intact and = Skin: warm and dry without rash Neuro: CNII-XII intact             Strength intact = bilaterally  2-D echocardiogram: Left ventricle: The cavity size was normal. Wall thickness was increased in a pattern of mild LVH. Systolic function was vigorous. The estimated ejection fraction was in the range of 65% to 70%. Doppler parameters are consistent with abnormal left ventricular relaxation (grade 1 diastolic dysfunction).  ------------------------------------------------------------ Aortic valve: AV is thickened, calcified with restricted motion. Peak and mean gradients through the valve are 157 and 93 mm Hg respectively consistent with critical AS> Doppler: Mild regurgitation. VTI ratio of LVOT to aortic valve: 0.16. Valve area: 0.59cm^2(VTI). Indexed valve area: 0.36cm^2/m^2 (VTI). Peak velocity ratio of LVOT to aortic valve: 0.16. Valve area: 0.6cm^2 (Vmax). Indexed valve area: 0.36cm^2/m^2 (Vmax). Mean gradient: 78mm Hg (S). Peak gradient: 176mm Hg (S).  ------------------------------------------------------------ Mitral valve: Calcified annulus. Leaflet separation was normal. Doppler: Transvalvular velocity was within the normal range. There was no evidence for stenosis. Trivial regurgitation. Peak gradient: 8mm Hg (D).  ------------------------------------------------------------ Left  atrium: The atrium was mildly dilated.  ------------------------------------------------------------ Right ventricle: The cavity size was normal. Wall thickness was normal. Systolic function was normal.  ------------------------------------------------------------ Pulmonic valve: Structurally normal valve. Cusp separation was normal. Doppler: Transvalvular velocity was within the normal range. Trivial regurgitation.  ------------------------------------------------------------ Tricuspid valve: Structurally normal valve. Leaflet separation was normal. Doppler: Transvalvular velocity was within the normal range. Mild regurgitation.  ------------------------------------------------------------ Right atrium: The atrium was normal in size.  ------------------------------------------------------------ Pericardium: There was no pericardial effusion.  ------------------------------------------------------------ Systemic veins: Inferior vena cava: The vessel was mildly dilated; the respirophasic diameter changes were in the normal range (= 50%).  ------------------------------------------------------------  2D measurements Normal Doppler measurements Normal Left ventricle Main pulmonary LVID ED, 52 mm 43-52 artery chord, Pressure, 33 mm Hg =30 PLAX S LVID ES, 35 mm 23-38 Left ventricle chord, Ea, lat 6.31 cm/s ------ PLAX ann, tiss FS, chord, 33 % >29 DP PLAX E/Ea, lat 13.3 ------ LVPW, ED 12 mm ------ ann, tiss IVS/LVPW 1.08 <1.3 DP ratio, ED Ea, med 5.55 cm/s ------ Ventricular septum ann, tiss IVS, ED 13 mm ------ DP LVOT E/Ea, med 15.1 ------ Diam, S 22 mm ------ ann, tiss 2 Area 3.8 cm^2 ------ DP Diam 22 mm ------ LVOT Aorta Peak vel, 97.9 cm/s ------ Root diam,  31 mm ------ S ED VTI, S 21.7 cm ------ AAo AP 36 mm ------ Stroke vol 82.5 ml ------ diam, S Stroke 50 ml/m^2 ------ Left atrium index AP dim 45 mm ------ Aortic valve AP dim 2.73 cm/m^2 <2.2 Peak vel,  620 cm/s ------ index S Mean vel, 427 cm/s ------ S VTI, S 139 cm ------ Mean 85 mm Hg ------ gradient, S Peak 154 mm Hg ------ gradient, S VTI ratio 0.16 ------ LVOT/AV Area, VTI 0.59 cm^2 ------ Area index 0.36 cm^2/m ------ (VTI) ^2 Peak vel 0.16 ------ ratio, LVOT/AV Area, Vmax 0.6 cm^2 ------ Area index 0.36 cm^2/m ------ (Vmax) ^2 Regurg PHT 287 ms ------ Mitral valve Peak E vel 83.9 cm/s ------ Peak A vel 110 cm/s ------ Decelerati 292 ms 150-23 on time 0 Peak 3 mm Hg ------ gradient, D Peak E/A 0.8 ------ ratio Tricuspid valve Regurg 276 cm/s ------ peak vel Peak RV-RA 30 mm Hg ------ gradient, S Systemic veins Estimated 3 mm Hg ------ CVP Right ventricle Pressure, 33 mm Hg <30 S Sa vel, 15 cm/s ------ lat ann, tiss DP  ASSESSMENT AND PLAN: 73 year-old male with severe/critical aortic stenosis, possible bicuspid aortic valve. He is here with his daughter today. Seems fairly limited from a physical perspective but reportedly able to care for himself.   I suspect his limitation from Parkinson's keeps him below threshold for cardiac symptoms related to AS. Considering his very severe AS with mean gradient > 90 mm Hg, I think it is reasonable to move forward with evaluation for AVR. I have discussed the natural history of severe aortic stenosis with the patient and his daughter at length. We have reviewed treatment considerations, including palliative medical therapy, surgical AVR, and TAVR. Parkinson's disease is the major risk with respect to recovery from open surgical AVR. The patient is reluctant to pursue surgical AVR, but is willing to undergo further evaluation to better delineate his treatment options.   I think that a gated cardiac CTA will be helpful to evaluate the aortic valve and annular morphology. The patient will also require cardiac cath (right and left) for assessment of CAD and hemodynamics. I have reviewed the risks of cardiac cath and  the patient understands these risks and agrees to proceed.   Will refer to cardiac surgery for further evaluation and discussion of treatment options after these studies are completed.   Time spent conducting this consultation = 60 minutes greater than 50% in face-to-face discussion with the patient.  Sherren Mocha 09/26/2013 12:14 PM

## 2013-09-26 NOTE — Patient Instructions (Signed)
Your physician has requested that you have a cardiac catheterization. Cardiac catheterization is used to diagnose and/or treat various heart conditions. Doctors may recommend this procedure for a number of different reasons. The most common reason is to evaluate chest pain. Chest pain can be a symptom of coronary artery disease (CAD), and cardiac catheterization can show whether plaque is narrowing or blocking your heart's arteries. This procedure is also used to evaluate the valves, as well as measure the blood flow and oxygen levels in different parts of your heart. For further information please visit [email or web address]. Please follow instruction sheet, as given.  Your physician has requested that you have cardiac CT. Cardiac computed tomography (CT) is a painless test that uses an x-ray machine to take clear, detailed pictures of your heart. For further information please visit [email or web address]. Please follow instruction sheet as given.

## 2013-10-01 ENCOUNTER — Encounter (HOSPITAL_COMMUNITY): Payer: Self-pay | Admitting: Pharmacy Technician

## 2013-10-09 ENCOUNTER — Ambulatory Visit (HOSPITAL_COMMUNITY)
Admission: RE | Admit: 2013-10-09 | Discharge: 2013-10-09 | Disposition: A | Discharge status: Home / Self Care | Payer: Medicare Other | Source: Ambulatory Visit | Attending: Cardiovascular Disease | Admitting: Cardiovascular Disease

## 2013-10-09 ENCOUNTER — Encounter (HOSPITAL_COMMUNITY)
Admission: RE | Disposition: A | Discharge status: Home / Self Care | Payer: Self-pay | Source: Ambulatory Visit | Attending: Cardiovascular Disease

## 2013-10-09 ENCOUNTER — Encounter (HOSPITAL_COMMUNITY): Payer: Self-pay

## 2013-10-09 DIAGNOSIS — K219 Gastro-esophageal reflux disease without esophagitis: Secondary | ICD-10-CM | POA: Diagnosis not present

## 2013-10-09 DIAGNOSIS — I2510 Atherosclerotic heart disease of native coronary artery without angina pectoris: Secondary | ICD-10-CM | POA: Diagnosis not present

## 2013-10-09 DIAGNOSIS — Z7982 Long term (current) use of aspirin: Secondary | ICD-10-CM | POA: Diagnosis not present

## 2013-10-09 DIAGNOSIS — F411 Generalized anxiety disorder: Secondary | ICD-10-CM | POA: Diagnosis not present

## 2013-10-09 DIAGNOSIS — Z8673 Personal history of transient ischemic attack (TIA), and cerebral infarction without residual deficits: Secondary | ICD-10-CM | POA: Insufficient documentation

## 2013-10-09 DIAGNOSIS — G20 Parkinson's disease: Secondary | ICD-10-CM | POA: Insufficient documentation

## 2013-10-09 DIAGNOSIS — G20A1 Parkinson's disease without dyskinesia, without mention of fluctuations: Secondary | ICD-10-CM | POA: Insufficient documentation

## 2013-10-09 DIAGNOSIS — I359 Nonrheumatic aortic valve disorder, unspecified: Secondary | ICD-10-CM | POA: Insufficient documentation

## 2013-10-09 HISTORY — PX: CARDIAC CATHETERIZATION: SHX172

## 2013-10-09 HISTORY — PX: LEFT AND RIGHT HEART CATHETERIZATION WITH CORONARY ANGIOGRAM: SHX5449

## 2013-10-09 LAB — POCT I-STAT 3, VENOUS BLOOD GAS (G3P V)
Acid-Base Excess: 1 mmol/L (ref 0.0–2.0)
Acid-base deficit: 1 mmol/L (ref 0.0–2.0)
BICARBONATE: 27.1 meq/L — AB (ref 20.0–24.0)
Bicarbonate: 25.6 mEq/L — ABNORMAL HIGH (ref 20.0–24.0)
O2 SAT: 73 %
O2 SAT: 76 %
PCO2 VEN: 46.6 mmHg (ref 45.0–50.0)
PH VEN: 7.348 — AB (ref 7.250–7.300)
TCO2: 27 mmol/L (ref 0–100)
TCO2: 29 mmol/L (ref 0–100)
pCO2, Ven: 49.3 mmHg (ref 45.0–50.0)
pH, Ven: 7.349 — ABNORMAL HIGH (ref 7.250–7.300)
pO2, Ven: 41 mmHg (ref 30.0–45.0)
pO2, Ven: 44 mmHg (ref 30.0–45.0)

## 2013-10-09 LAB — BASIC METABOLIC PANEL
BUN: 19 mg/dL (ref 6–23)
CALCIUM: 9.6 mg/dL (ref 8.4–10.5)
CO2: 29 mEq/L (ref 19–32)
Chloride: 103 mEq/L (ref 96–112)
Creatinine, Ser: 0.86 mg/dL (ref 0.50–1.35)
GFR calc non Af Amer: 84 mL/min — ABNORMAL LOW (ref >90)
Glucose, Bld: 107 mg/dL — ABNORMAL HIGH (ref 70–99)
Potassium: 4.1 mEq/L (ref 3.7–5.3)
Sodium: 142 mEq/L (ref 137–147)

## 2013-10-09 LAB — CBC
HCT: 41.2 % (ref 39.0–52.0)
Hemoglobin: 13.7 g/dL (ref 13.0–17.0)
MCH: 29.2 pg (ref 26.0–34.0)
MCHC: 33.3 g/dL (ref 30.0–36.0)
MCV: 87.8 fL (ref 78.0–100.0)
PLATELETS: 102 10*3/uL — AB (ref 150–400)
RBC: 4.69 MIL/uL (ref 4.22–5.81)
RDW: 13.6 % (ref 11.5–15.5)
WBC: 6 10*3/uL (ref 4.0–10.5)

## 2013-10-09 LAB — POCT I-STAT 3, ART BLOOD GAS (G3+)
ACID-BASE EXCESS: 2 mmol/L (ref 0.0–2.0)
Bicarbonate: 27.3 mEq/L — ABNORMAL HIGH (ref 20.0–24.0)
O2 Saturation: 99 %
TCO2: 29 mmol/L (ref 0–100)
pCO2 arterial: 45.7 mmHg — ABNORMAL HIGH (ref 35.0–45.0)
pH, Arterial: 7.385 (ref 7.350–7.450)
pO2, Arterial: 135 mmHg — ABNORMAL HIGH (ref 80.0–100.0)

## 2013-10-09 LAB — PROTIME-INR
INR: 1.1 (ref 0.00–1.49)
PROTHROMBIN TIME: 14 s (ref 11.6–15.2)

## 2013-10-09 SURGERY — LEFT AND RIGHT HEART CATHETERIZATION WITH CORONARY ANGIOGRAM
Anesthesia: LOCAL

## 2013-10-09 MED ORDER — FENTANYL CITRATE 0.05 MG/ML IJ SOLN
INTRAMUSCULAR | Status: AC
  Filled 2013-10-09: qty 2

## 2013-10-09 MED ORDER — MIDAZOLAM HCL 2 MG/2ML IJ SOLN
INTRAMUSCULAR | Status: AC
  Filled 2013-10-09: qty 2

## 2013-10-09 MED ORDER — LIDOCAINE HCL (PF) 1 % IJ SOLN
INTRAMUSCULAR | Status: AC
  Filled 2013-10-09: qty 30

## 2013-10-09 MED ORDER — ASPIRIN 81 MG PO CHEW
81.0000 mg | CHEWABLE_TABLET | ORAL | Status: DC
Start: 2013-10-09 — End: 2013-10-09

## 2013-10-09 MED ORDER — SODIUM CHLORIDE 0.9 % IJ SOLN: 3.0000 mL | INTRAMUSCULAR | Status: DC | PRN

## 2013-10-09 MED ORDER — SODIUM CHLORIDE 0.9 % IV SOLN: 250.0000 mL | INTRAVENOUS | Status: DC | PRN

## 2013-10-09 MED ORDER — SODIUM CHLORIDE 0.9 % IJ SOLN: 3.0000 mL | Freq: Two times a day (BID) | INTRAMUSCULAR | Status: DC

## 2013-10-09 MED ORDER — SODIUM CHLORIDE 0.9 % IV SOLN: 1.0000 mL/kg/h | INTRAVENOUS | Status: DC

## 2013-10-09 MED ORDER — ONDANSETRON HCL 4 MG/2ML IJ SOLN: 4.0000 mg | Freq: Four times a day (QID) | INTRAMUSCULAR | Status: DC | PRN

## 2013-10-09 MED ORDER — NITROGLYCERIN 0.2 MG/ML ON CALL CATH LAB
INTRAVENOUS | Status: AC
  Filled 2013-10-09: qty 1

## 2013-10-09 MED ORDER — SODIUM CHLORIDE 0.9 % IV SOLN
INTRAVENOUS | Status: DC
Start: 2013-10-09 — End: 2013-10-09

## 2013-10-09 MED ORDER — HEPARIN (PORCINE) IN NACL 2-0.9 UNIT/ML-% IJ SOLN
INTRAMUSCULAR | Status: AC
  Filled 2013-10-09: qty 1000

## 2013-10-09 NOTE — CV Procedure (Signed)
    Cardiac Catheterization Procedure Note  Name: [name]. MRN: [id number] DOB: [birth year]  Procedure: Right Heart Cath, Selective Coronary Angiography, Aortic root angiography  Indication: Severe aortic stenosis   Procedural Details: The right groin was prepped, draped, and anesthetized with 1% lidocaine. Using the modified Seldinger technique a 4 French sheath was placed in the right femoral artery and a 7 French sheath was placed in the right femoral vein. A Swan-Ganz catheter was used for the right heart catheterization. Standard protocol was followed for recording of right heart pressures and sampling of oxygen saturations. Fick cardiac output was calculated. Visualization of the LCA was poor with a 4 Fr catheter wo the sheath and JL-4 were upsized to 5 Fr. Standard Judkins catheters were used for selective coronary angiography and left ventriculography. There were no immediate procedural complications. The patient was transferred to the post catheterization recovery area for further monitoring.  Procedural Findings: Hemodynamics RA 5 RV 45/7 PA 43/15 mean 26 PCWP 14 LV not recorded AO 149/74 mean 104  Oxygen saturations: PA 73 AO 99 SVC 76  Cardiac Output (Fick) 5.4  Cardiac Index (Fick) 3.1   Coronary angiography: Coronary dominance: right  Left mainstem: Widely patent without obstructive disease  Left anterior descending (LAD): The proximal LAD is smooth without irregularity or obstructive disease. The first diagonal is large and widely patent. The LAD just beyond the diagonal has an eccentric 40% stenosis. The remainder of the vessel is widely patent without disease.   Left circumflex (LCx): Large, dominant vessel. The OM, PLA, and PDA branches are all patent without obstructive disease.  Right coronary artery (RCA): medium caliber, nondominant vessel, supplying a single RV marginal branch. The vessel is smooth without obstructive disease.  Left  ventriculography: Deferred (Ao valve was not crossed)  Aoritc root angiography: The aortic valve has extreme bulky calcification and limited mobility. The left cusp has restricted motion, the other cusp is immobile. There is 2-3+ AI.  Final Conclusions:   1. Patent coronary arteries (left dominant) with mild nonobstructive disease of the LAD 2. Severely calcified, restricted aortic valve (likely bicuspid) with at least moderate AI 3. Essentially normal right heart hemodynamics except for mildly elevated PA pressure  Recommendations: Eval by cardiac surgery for consideration of open surgical AVR versus TAVR.  Sherren Mocha 10/09/2013, 9:44 AM

## 2013-10-09 NOTE — H&P (View-Only) (Signed)
 HPI:  72-year-old gentleman presenting for evaluation of severe aortic stenosis and consideration of transcatheter aortic valve replacement.  The patient was just evaluated by Dr. Skains who referred him for further discussion of treatment options.  The patient has a longstanding heart murmur for approximately 20 years and has been told he has a bicuspid aortic valve. Recently his murmur was noted to be increased in intensity and he was referred for an echocardiogram. This demonstrated critical aortic stenosis with peak and mean transaortic valve gradients of 157 and 93 mm Hg.   The patient is limited by Parkinson's Disease which has been present for several years. He has slowed speech and modestly limited physical activity, but is still able to ambulate with a cane. At times he walks without his cane. He reports fatigue and 'tiredness' with activity, but denies shortness of breath, chest pain, lightheadedness, or syncope. He denies leg swelling, orthopnea, or PND.   He lives alone after his wife passed away about 9 months ago. He's able to care for himself and perform all ADL's. He has 4 adult children who live around [place].   Outpatient Encounter Prescriptions as of 09/26/2013  Medication Sig  . acetaminophen (TYLENOL) 500 MG tablet Take 1,000 mg by mouth as needed. For pain  . acetic acid-hydrocortisone (VOSOL-HC) otic solution As needed  . ASA-APAP-Caff Buffered (VANQUISH PO) Take by mouth. As needed  . aspirin 325 MG tablet Take 325 mg by mouth daily.  . butalbital-acetaminophen-caffeine (ESGIC PLUS) 50-500-40 MG per tablet Take 1 tablet by mouth every 4 (four) hours as needed. For headaches  . carbidopa-levodopa (SINEMET) 25-100 MG per tablet Take 1.5 tablets by mouth 3 (three) times daily.   . cholecalciferol (VITAMIN D) 1000 UNITS tablet Take 1,000 Units by mouth daily.  . clonazePAM (KLONOPIN) 1 MG tablet Take 1.5 mg by mouth at bedtime.   . fish oil-omega-3 fatty acids 1000 MG  capsule Take 1 g by mouth daily.  . LORazepam (ATIVAN) 0.5 MG tablet Take 0.5 mg by mouth daily as needed. For tension  . omeprazole (PRILOSEC) 20 MG capsule Take 20 mg by mouth daily.  . sertraline (ZOLOFT) 50 MG tablet Take 50 mg by mouth at bedtime.   . traMADol (ULTRAM) 50 MG tablet Take 50 mg by mouth daily as needed. For pain    Vicodin  Past Medical History  Diagnosis Date  . Kidney stones   . Parkinson disease   . GERD (gastroesophageal reflux disease)   . Aortic stenosis   . Anxiety   . Headache(784.0)   . TIA (transient ischemic attack) 02/08/2007  . Parkinson's disease (tremor, stiffness, slow motion, unstable posture) 12/11/2006    Past Surgical History  Procedure Laterality Date  . Cataract  2008    bilateral  . Cystoscopy/retrograde/ureteroscopy  05/29/2011    Procedure: CYSTOSCOPY/RETROGRADE/URETEROSCOPY;  Surgeon: John J Wrenn, MD;  Location: [facility];  Service: Urology;  Laterality: Left;  no stent     History   Social History  . Marital Status: Married    Spouse Name: N/A    Number of Children: N/A  . Years of Education: N/A   Occupational History  . Not on file.   Social History Main Topics  . Smoking status: Never Smoker   . Smokeless tobacco: Not on file  . Alcohol Use: No  . Drug Use: No  . Sexual Activity:    Other Topics Concern  . Not on file   Social History Narrative  . No   narrative on file    Family History: negative for coronary or valvular heart disease  ROS:  General: no fevers/chills/night sweats, + fatigue Eyes: no blurry vision, diplopia, or amaurosis ENT: no sore throat or hearing loss Resp: no cough, wheezing, or hemoptysis CV: no edema or palpitations GI: no abdominal pain, nausea, vomiting, diarrhea, or constipation GU: no dysuria, frequency, or hematuria Skin: no rash Neuro: no headache, numbness, tingling, + weakness of extremities Musculoskeletal: no joint pain or swelling Heme: no bleeding, DVT, or easy  bruising Endo: no polydipsia or polyuria  BP 120/66  Pulse 68  Ht 5\' 5"  (1.651 m)  Wt 65.318 kg (144 lb)  BMI 23.96 kg/m2  PHYSICAL EXAM: Pt is alert and oriented, WD, WN, in no distress. Parkinsonian features present with slow deliberate speech. HEENT: normal Neck: JVP normal. Carotid upstrokes delayed without bruits. No thyromegaly. Lungs: equal expansion, clear bilaterally CV: Apex is discrete and nondisplaced, RRR with grade 3/6 systolic murmur at the RUSB Abd: soft, NT, +BS, no bruit, no hepatosplenomegaly Back: no CVA tenderness Ext: no C/C/E        DP/PT pulses intact and = Skin: warm and dry without rash Neuro: CNII-XII intact             Strength intact = bilaterally  2-D echocardiogram: Left ventricle: The cavity size was normal. Wall thickness was increased in a pattern of mild LVH. Systolic function was vigorous. The estimated ejection fraction was in the range of 65% to 70%. Doppler parameters are consistent with abnormal left ventricular relaxation (grade 1 diastolic dysfunction).  ------------------------------------------------------------ Aortic valve: AV is thickened, calcified with restricted motion. Peak and mean gradients through the valve are 157 and 93 mm Hg respectively consistent with critical AS> Doppler: Mild regurgitation. VTI ratio of LVOT to aortic valve: 0.16. Valve area: 0.59cm^2(VTI). Indexed valve area: 0.36cm^2/m^2 (VTI). Peak velocity ratio of LVOT to aortic valve: 0.16. Valve area: 0.6cm^2 (Vmax). Indexed valve area: 0.36cm^2/m^2 (Vmax). Mean gradient: 78mm Hg (S). Peak gradient: 176mm Hg (S).  ------------------------------------------------------------ Mitral valve: Calcified annulus. Leaflet separation was normal. Doppler: Transvalvular velocity was within the normal range. There was no evidence for stenosis. Trivial regurgitation. Peak gradient: 8mm Hg (D).  ------------------------------------------------------------ Left  atrium: The atrium was mildly dilated.  ------------------------------------------------------------ Right ventricle: The cavity size was normal. Wall thickness was normal. Systolic function was normal.  ------------------------------------------------------------ Pulmonic valve: Structurally normal valve. Cusp separation was normal. Doppler: Transvalvular velocity was within the normal range. Trivial regurgitation.  ------------------------------------------------------------ Tricuspid valve: Structurally normal valve. Leaflet separation was normal. Doppler: Transvalvular velocity was within the normal range. Mild regurgitation.  ------------------------------------------------------------ Right atrium: The atrium was normal in size.  ------------------------------------------------------------ Pericardium: There was no pericardial effusion.  ------------------------------------------------------------ Systemic veins: Inferior vena cava: The vessel was mildly dilated; the respirophasic diameter changes were in the normal range (= 50%).  ------------------------------------------------------------  2D measurements Normal Doppler measurements Normal Left ventricle Main pulmonary LVID ED, 52 mm 43-52 artery chord, Pressure, 33 mm Hg =30 PLAX S LVID ES, 35 mm 23-38 Left ventricle chord, Ea, lat 6.31 cm/s ------ PLAX ann, tiss FS, chord, 33 % >29 DP PLAX E/Ea, lat 13.3 ------ LVPW, ED 12 mm ------ ann, tiss IVS/LVPW 1.08 <1.3 DP ratio, ED Ea, med 5.55 cm/s ------ Ventricular septum ann, tiss IVS, ED 13 mm ------ DP LVOT E/Ea, med 15.1 ------ Diam, S 22 mm ------ ann, tiss 2 Area 3.8 cm^2 ------ DP Diam 22 mm ------ LVOT Aorta Peak vel, 97.9 cm/s ------ Root diam,  31 mm ------ S ED VTI, S 21.7 cm ------ AAo AP 36 mm ------ Stroke vol 82.5 ml ------ diam, S Stroke 50 ml/m^2 ------ Left atrium index AP dim 45 mm ------ Aortic valve AP dim 2.73 cm/m^2 <2.2 Peak vel,  620 cm/s ------ index S Mean vel, 427 cm/s ------ S VTI, S 139 cm ------ Mean 85 mm Hg ------ gradient, S Peak 154 mm Hg ------ gradient, S VTI ratio 0.16 ------ LVOT/AV Area, VTI 0.59 cm^2 ------ Area index 0.36 cm^2/m ------ (VTI) ^2 Peak vel 0.16 ------ ratio, LVOT/AV Area, Vmax 0.6 cm^2 ------ Area index 0.36 cm^2/m ------ (Vmax) ^2 Regurg PHT 287 ms ------ Mitral valve Peak E vel 83.9 cm/s ------ Peak A vel 110 cm/s ------ Decelerati 292 ms 150-23 on time 0 Peak 3 mm Hg ------ gradient, D Peak E/A 0.8 ------ ratio Tricuspid valve Regurg 276 cm/s ------ peak vel Peak RV-RA 30 mm Hg ------ gradient, S Systemic veins Estimated 3 mm Hg ------ CVP Right ventricle Pressure, 33 mm Hg <30 S Sa vel, 15 cm/s ------ lat ann, tiss DP  ASSESSMENT AND PLAN: 72 year-old male with severe/critical aortic stenosis, possible bicuspid aortic valve. He is here with his daughter today. Seems fairly limited from a physical perspective but reportedly able to care for himself.   I suspect his limitation from Parkinson's keeps him below threshold for cardiac symptoms related to AS. Considering his very severe AS with mean gradient > 90 mm Hg, I think it is reasonable to move forward with evaluation for AVR. I have discussed the natural history of severe aortic stenosis with the patient and his daughter at length. We have reviewed treatment considerations, including palliative medical therapy, surgical AVR, and TAVR. Parkinson's disease is the major risk with respect to recovery from open surgical AVR. The patient is reluctant to pursue surgical AVR, but is willing to undergo further evaluation to better delineate his treatment options.   I think that a gated cardiac CTA will be helpful to evaluate the aortic valve and annular morphology. The patient will also require cardiac cath (right and left) for assessment of CAD and hemodynamics. I have reviewed the risks of cardiac cath and  the patient understands these risks and agrees to proceed.   Will refer to cardiac surgery for further evaluation and discussion of treatment options after these studies are completed.   Time spent conducting this consultation = 60 minutes greater than 50% in face-to-face discussion with the patient.  Montina Dorrance 09/26/2013 12:14 PM    

## 2013-10-09 NOTE — Discharge Instructions (Signed)

## 2013-10-09 NOTE — Interval H&P Note (Signed)
History and Physical Interval Note:  10/09/2013 9:03 AM  [name].  has presented today for surgery, with the diagnosis of aortic stenosis  The various methods of treatment have been discussed with the patient and family. After consideration of risks, benefits and other options for treatment, the patient has consented to  Procedure(s): LEFT AND RIGHT HEART CATHETERIZATION WITH CORONARY ANGIOGRAM (N/A) as a surgical intervention .  The patient's history has been reviewed, patient examined, no change in status, stable for surgery.  I have reviewed the patient's chart and labs.  Questions were answered to the patient's satisfaction.    Cath Lab Visit (complete for each Cath Lab visit)  Clinical Evaluation Leading to the Procedure:   ACS: no  Non-ACS:    Anginal Classification: CCS III  Anti-ischemic medical therapy: No Therapy  Non-Invasive Test Results: No non-invasive testing performed  Prior CABG: No previous CABG       Sherren Mocha

## 2013-10-10 ENCOUNTER — Encounter: Payer: Self-pay | Admitting: Cardiovascular Disease

## 2013-10-15 ENCOUNTER — Ambulatory Visit (HOSPITAL_COMMUNITY): Admission: RE | Admit: 2013-10-15 | Payer: Medicare Other | Source: Ambulatory Visit

## 2013-10-20 ENCOUNTER — Ambulatory Visit (HOSPITAL_COMMUNITY)
Admission: RE | Admit: 2013-10-20 | Discharge: 2013-10-20 | Disposition: A | Discharge status: Home / Self Care | Payer: Medicare Other | Source: Ambulatory Visit | Attending: Cardiovascular Disease | Admitting: Cardiovascular Disease

## 2013-10-20 DIAGNOSIS — I350 Nonrheumatic aortic (valve) stenosis: Secondary | ICD-10-CM

## 2013-10-20 DIAGNOSIS — I359 Nonrheumatic aortic valve disorder, unspecified: Secondary | ICD-10-CM

## 2013-10-20 MED ORDER — METOPROLOL TARTRATE 1 MG/ML IV SOLN
INTRAVENOUS | Status: AC
  Filled 2013-10-20: qty 5

## 2013-10-20 MED ORDER — IOHEXOL 350 MG/ML SOLN
80.0000 mL | Freq: Once | INTRAVENOUS | Status: AC | PRN
  Administered 2013-10-20: 80 mL via INTRAVENOUS

## 2013-10-21 ENCOUNTER — Other Ambulatory Visit (HOSPITAL_COMMUNITY): Payer: Medicare Other

## 2013-10-22 ENCOUNTER — Encounter: Payer: Self-pay | Admitting: Thoracic Surgery (Cardiothoracic Vascular Surgery)

## 2013-10-22 ENCOUNTER — Institutional Professional Consult (permissible substitution) (INDEPENDENT_AMBULATORY_CARE_PROVIDER_SITE_OTHER): Payer: Medicare Other | Admitting: Thoracic Surgery (Cardiothoracic Vascular Surgery)

## 2013-10-22 VITALS — BP 105/61 | HR 73 | Resp 20 | Ht 66.0 in | Wt 144.0 lb

## 2013-10-22 DIAGNOSIS — I350 Nonrheumatic aortic (valve) stenosis: Secondary | ICD-10-CM

## 2013-10-22 DIAGNOSIS — I359 Nonrheumatic aortic valve disorder, unspecified: Secondary | ICD-10-CM

## 2013-10-22 NOTE — Progress Notes (Addendum)
HEART AND [name] SURGERY CONSULTATION REPORT  Referring Provider is Sherren Mocha, MD Primary Cardiologist is Candee Furbish, MD PCP is Horton Finer, MD  Chief Complaint  Patient presents with  . Aortic Stenosis    Surgical eval, Cardiac cath 10/09/13, ECHO 09/12/13, Cardiac gated CT 10/20/13     HPI:  Patient is a 73 year old recently widowed white male from [name] with known history of aortic stenosis and Parkinson's disease referred to discuss treatment options for the management of severe symptomatic aortic stenosis. The patient has reportedly had a heart murmur for more than 20 years, and in the past she has been told that he had a bicuspid aortic valve. The patient was recently seen by his primary care physician because of an inguinal hernia. He was noted to have a more prominent murmur on physical exam and followup echocardiogram was obtained on may first 2015. This revealed the presence of critical aortic stenosis with peak velocity across the aortic valve greater than 6 m/s corresponding to peak and mean transvalvular gradients estimated to be 157 and 93 mm Hg, respectively.  Left ventricular systolic function remained normal with ejection fraction estimated 65%. The patient was referred for cardiology consultation and seen initially by Dr. Marlou Porch and subsequently by Dr. Burt Knack. The patient has since then undergone left and right heart catheterization which revealed the presence of mild, nonobstructive coronary artery disease. A cardiac gated CT angiogram of the heart was performed to further evaluate the functional anatomy of the patient's aortic valve, and the patient has been referred for surgical consultation.  The patient currently lives alone after his wife passed away recently.  He has 4 adult children who live locally and he is accompanied by 2 of his daughters for this consultation, one of whom lives  in a house next door to the patient. 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 Over the past 10 years the patient has suffered significant progressive physical decline related to Parkinson's disease which was originally diagnosed in 2008. He has been followed by Dr. Erie Noe at [facility]. His speech and physical mobility is now quite limited. He states that he still care for himself at home, but his family notes that they are present every day and help him with much of his daily activities. He stopped driving an automobile at his familys request more than 3 years ago.  He has experienced some gradual progression of memory loss, confusion, and difficulty with sleeping. His speech is quite slow. He reports mild exertional fatigue and shortness of breath. He states that this does not limit his daily physical activities, but he has noticed a progressive decline over the last few years. He also reports some mild occasional tightness across his chest with exertion. He denies any history of resting shortness of breath or chest pain. He denies any history of PND, orthopnea, or lower extremity edema. He has not had dizzy spells, palpitations, nor syncope.  Past Medical History  Diagnosis Date  . Kidney stones   . Parkinson disease   . GERD (gastroesophageal reflux disease)   . Aortic stenosis   . Anxiety   . Headache(784.0)   . TIA (transient ischemic attack) 02/08/2007  . Parkinson's disease (tremor, stiffness, slow motion,  unstable posture) 12/11/2006    Past Surgical History  Procedure Laterality Date  . Cataract  2008    bilateral  . Cystoscopy/retrograde/ureteroscopy  05/29/2011    Procedure: CYSTOSCOPY/RETROGRADE/URETEROSCOPY;  Surgeon: Malka So, MD;  Location: [facility];  Service: Urology;   Laterality: Left;  no stent     No family history on file.  History   Social History  . Marital Status: Married    Spouse Name: N/A    Number of Children: N/A  . Years of Education: N/A   Occupational History  . Not on file.   Social History Main Topics  . Smoking status: Never Smoker   . Smokeless tobacco: Not on file  . Alcohol Use: No  . Drug Use: No  . Sexual Activity:    Other Topics Concern  . Not on file   Social History Narrative  . No narrative on file    Current Outpatient Prescriptions  Medication Sig Dispense Refill  . acetaminophen (TYLENOL) 500 MG tablet Take 1,000 mg by mouth 2 (two) times daily as needed (pain). For pain      . ASA-APAP-Caff Buffered (VANQUISH PO) Take 2 tablets by mouth daily as needed (pain). As needed      . aspirin 325 MG tablet Take 325 mg by mouth daily.      . butalbital-acetaminophen-caffeine (ESGIC PLUS) 50-500-40 MG per tablet Take 1 tablet by mouth every 4 (four) hours as needed (headaches).       . carbidopa-levodopa (SINEMET) 25-100 MG per tablet Take 1.5 tablets by mouth 3 (three) times daily.       . cholecalciferol (VITAMIN D) 1000 UNITS tablet Take 1,000 Units by mouth daily.      . clonazePAM (KLONOPIN) 1 MG tablet Take 1.5 mg by mouth at bedtime.       Marland Kitchen LORazepam (ATIVAN) 0.5 MG tablet Take 0.5 mg by mouth daily as needed (tension).       Marland Kitchen omeprazole (PRILOSEC) 20 MG capsule Take 20 mg by mouth daily.      . sertraline (ZOLOFT) 50 MG tablet Take 50 mg by mouth daily.       . traMADol (ULTRAM) 50 MG tablet Take 50 mg by mouth daily as needed (pain).        No current facility-administered medications for this visit.    Allergies  Allergen Reactions  . Vicodin [Hydrocodone-Acetaminophen] Other (See Comments)    "Mental reaction"      Review of Systems:   General:  normal appetite, decreased energy, no weight gain, no weight loss, no fever  Cardiac:  + chest pain with exertion, no chest pain at rest, + SOB  with exertion, no resting SOB, no PND, no orthopnea, no palpitations, no arrhythmia, no atrial fibrillation, no LE edema, no dizzy spells, no syncope  Respiratory:  + exertional shortness of breath, no home oxygen, no productive cough, no dry cough, no bronchitis, no wheezing, no hemoptysis, no asthma, no pain with inspiration or cough, no sleep apnea, no CPAP at night  GI:   no difficulty swallowing, + reflux, + frequent heartburn, + hiatal hernia, no abdominal pain, no constipation, no diarrhea, no hematochezia, no hematemesis, no melena  GU:   no dysuria,  no frequency, no urinary tract infection, no hematuria, no enlarged prostate, + kidney stones, no kidney disease  Vascular:  no pain suggestive of claudication, no pain in feet, occasional leg cramps, no varicose veins, no DVT, no non-healing foot ulcer  Neuro:  no stroke, + TIA's, no seizures, + headaches, no temporary blindness one eye,  + slurred speech, no peripheral neuropathy, no chronic pain, + instability of gait, + memory/cognitive dysfunction  Musculoskeletal: + arthritis, no joint swelling, no myalgias, + difficulty walking, limited mobility   Skin:   no rash, no itching, no skin infections, no pressure sores or ulcerations  Psych:   no anxiety, + depression, no nervousness, + unusual recent stress  Eyes:   + blurry vision, no floaters, no recent vision changes, + wears glasses or contacts  ENT:   no hearing loss, no loose or painful teeth, no dentures, last saw dentist last year  Hematologic:  no easy bruising, no abnormal bleeding, no clotting disorder, no frequent epistaxis  Endocrine:  no diabetes, does not check CBG's at home           Physical Exam:   Ht 5\' 6"  (1.676 m)  Wt 144 lb (65.318 kg)  BMI 23.25 kg/m2  SpO2 %  General:  Thin and frail-appearing  HEENT:  Unremarkable   Neck:   no JVD, no bruits, no adenopathy   Chest:   clear to auscultation, symmetrical breath sounds, no wheezes, no rhonchi   CV:   RRR,  grade IV/VI late-peaking crescendo/decrescendo murmur heard best at RSB,  no diastolic murmur  Abdomen:  soft, non-tender, no masses   Extremities:  warm, well-perfused, pulses palpable, no LE edema  Rectal/GU  Deferred  Neuro:   Grossly non-focal and symmetrical throughout, + short term memory deficit, very slow speech and mobility  Skin:   Clean and dry, no rashes, no breakdown   Diagnostic Tests:  Transthoracic Echocardiography  Patient: [name], [name] MR #: [id number] Study Date: 09/12/2013 Gender: M Age: 45 Height: 157.5cm Weight: 64.4kg BSA: 1.3m^2 Pt. Status: Room:  ATTENDING Jaymes Graff, [place], Outpatient cc:  ------------------------------------------------------------ LV EF: 65% - 70%  ------------------------------------------------------------ Indications: 424.1 Aortic valve disorders.  ------------------------------------------------------------ History: PMH: Bicuspid aortic valve. Acquired from the patient and from the patient's chart. Dyspnea. Aortic stenosis.  ------------------------------------------------------------ Study Conclusions  - Left ventricle: The cavity size was normal. Wall thickness was increased in a pattern of mild LVH. Systolic function was vigorous. The estimated ejection fraction was in the range of 65% to 70%. Doppler parameters are consistent with abnormal left ventricular relaxation (grade 1 diastolic dysfunction). - Aortic valve: AV is thickened, calcified with restricted motion. Peak and mean gradients through the valve are 157 and 93 mm Hg respectively consistent with critical AS> Mild regurgitation. - Pulmonary arteries: PA peak pressure: 40mm Hg (S). Transthoracic echocardiography. M-mode, complete 2D, spectral Doppler, and color Doppler. Height: Height: 157.5cm. Height: 62in. Weight: Weight: 64.4kg.  Weight: 141.7lb. Body mass index: BMI: 26kg/m^2. Body surface area: BSA: 1.93m^2. Blood pressure: 118/80. Patient status: Outpatient. Location: [place] [facility]  ------------------------------------------------------------  ------------------------------------------------------------ Left ventricle: The cavity size was normal. Wall thickness was increased in a pattern of mild LVH. Systolic function was vigorous. The estimated ejection fraction was in the range of 65% to 70%. Doppler parameters are consistent with abnormal left ventricular relaxation (grade 1 diastolic dysfunction).  ------------------------------------------------------------ Aortic valve: AV is thickened, calcified with restricted motion. Peak and mean gradients through the valve are 157 and 93 mm Hg respectively consistent with critical AS> Doppler: Mild regurgitation. VTI ratio of LVOT to aortic valve: 0.16. Valve area: 0.59cm^2(VTI). Indexed valve area: 0.36cm^2/m^2 (VTI). Peak velocity ratio of LVOT to aortic valve:  0.16. Valve area: 0.6cm^2 (Vmax). Indexed valve area: 0.36cm^2/m^2 (Vmax). Mean gradient: 18mm Hg (S). Peak gradient: 165mm Hg (S).  ------------------------------------------------------------ Mitral valve: Calcified annulus. Leaflet separation was normal. Doppler: Transvalvular velocity was within the normal range. There was no evidence for stenosis. Trivial regurgitation. Peak gradient: 62mm Hg (D).  ------------------------------------------------------------ Left atrium: The atrium was mildly dilated.  ------------------------------------------------------------ Right ventricle: The cavity size was normal. Wall thickness was normal. Systolic function was normal.  ------------------------------------------------------------ Pulmonic valve: Structurally normal valve. Cusp separation was normal. Doppler: Transvalvular velocity was within the normal range. Trivial  regurgitation.  ------------------------------------------------------------ Tricuspid valve: Structurally normal valve. Leaflet separation was normal. Doppler: Transvalvular velocity was within the normal range. Mild regurgitation.  ------------------------------------------------------------ Right atrium: The atrium was normal in size.  ------------------------------------------------------------ Pericardium: There was no pericardial effusion.  ------------------------------------------------------------ Systemic veins: Inferior vena cava: The vessel was mildly dilated; the respirophasic diameter changes were in the normal range (= 50%).  ------------------------------------------------------------  2D measurements Normal Doppler measurements Normal Left ventricle Main pulmonary LVID ED, 52 mm 43-52 artery chord, Pressure, 33 mm Hg =30 PLAX S LVID ES, 35 mm 23-38 Left ventricle chord, Ea, lat 6.31 cm/s ------ PLAX ann, tiss FS, chord, 33 % >29 DP PLAX E/Ea, lat 13.3 ------ LVPW, ED 12 mm ------ ann, tiss IVS/LVPW 1.08 <1.3 DP ratio, ED Ea, med 5.55 cm/s ------ Ventricular septum ann, tiss IVS, ED 13 mm ------ DP LVOT E/Ea, med 15.1 ------ Diam, S 22 mm ------ ann, tiss 2 Area 3.8 cm^2 ------ DP Diam 22 mm ------ LVOT Aorta Peak vel, 97.9 cm/s ------ Root diam, 31 mm ------ S ED VTI, S 21.7 cm ------ AAo AP 36 mm ------ Stroke vol 82.5 ml ------ diam, S Stroke 50 ml/m^2 ------ Left atrium index AP dim 45 mm ------ Aortic valve AP dim 2.73 cm/m^2 <2.2 Peak vel, 620 cm/s ------ index S Mean vel, 427 cm/s ------ S VTI, S 139 cm ------ Mean 85 mm Hg ------ gradient, S Peak 154 mm Hg ------ gradient, S VTI ratio 0.16 ------ LVOT/AV Area, VTI 0.59 cm^2 ------ Area index 0.36 cm^2/m ------ (VTI) ^2 Peak vel 0.16 ------ ratio, LVOT/AV Area, Vmax 0.6 cm^2 ------ Area index 0.36 cm^2/m ------ (Vmax) ^2 Regurg PHT 287 ms ------ Mitral valve Peak E vel 83.9  cm/s ------ Peak A vel 110 cm/s ------ Decelerati 292 ms 150-23 on time 0 Peak 3 mm Hg ------ gradient, D Peak E/A 0.8 ------ ratio Tricuspid valve Regurg 276 cm/s ------ peak vel Peak RV-RA 30 mm Hg ------ gradient, S Systemic veins Estimated 3 mm Hg ------ CVP Right ventricle Pressure, 33 mm Hg <30 S Sa vel, 15 cm/s ------ lat ann, tiss DP  ------------------------------------------------------------ Prepared and Electronically Authenticated by  Dorris Carnes [id number]         Cardiac Catheterization Procedure Note   Name: [name]  MRN: [id number]  DOB: 12/17/1940  Procedure: Right Heart Cath, Selective Coronary Angiography, Aortic root angiography  Indication: Severe aortic stenosis  Procedural Details: The right groin was prepped, draped, and anesthetized with 1% lidocaine. Using the modified Seldinger technique a 4 French sheath was placed in the right femoral artery and a 7 French sheath was placed in the right femoral vein. A Swan-Ganz catheter was used for the right heart catheterization. Standard protocol was followed for recording of right heart pressures and sampling of oxygen saturations. Fick cardiac output was calculated. Visualization of the LCA was poor with a 4 Fr catheter wo the sheath  and JL-4 were upsized to 5 Fr. Standard Judkins catheters were used for selective coronary angiography and left ventriculography. There were no immediate procedural complications. The patient was transferred to the post catheterization recovery area for further monitoring.  Procedural Findings:  Hemodynamics  RA 5  RV 45/7  PA 43/15 mean 26  PCWP 14  LV not recorded  AO 149/74 mean 104  Oxygen saturations:  PA 73  AO 99  SVC 76  Cardiac Output (Fick) 5.4  Cardiac Index (Fick) 3.1  Coronary angiography:  Coronary dominance: right  Left mainstem: Widely patent without obstructive disease  Left anterior descending (LAD): The proximal  LAD is smooth without irregularity or obstructive disease. The first diagonal is large and widely patent. The LAD just beyond the diagonal has an eccentric 40% stenosis. The remainder of the vessel is widely patent without disease.  Left circumflex (LCx): Large, dominant vessel. The OM, PLA, and PDA branches are all patent without obstructive disease.  Right coronary artery (RCA): medium caliber, nondominant vessel, supplying a single RV marginal branch. The vessel is smooth without obstructive disease.  Left ventriculography: Deferred (Ao valve was not crossed)  Aoritc root angiography: The aortic valve has extreme bulky calcification and limited mobility. The left cusp has restricted motion, the other cusp is immobile. There is 2-3+ AI.  Final Conclusions:  1. Patent coronary arteries (left dominant) with mild nonobstructive disease of the LAD  2. Severely calcified, restricted aortic valve (likely bicuspid) with at least moderate AI  3. Essentially normal right heart hemodynamics except for mildly elevated PA pressure  Recommendations: Eval by cardiac surgery for consideration of open surgical AVR versus TAVR.  Sherren Mocha  10/09/2013, 9:44 AM    Cardiac TAVR CT  TECHNIQUE:  The patient was scanned on a Philips 256 scanner. A 120 kV  retrospective scan was triggered in the descending thoracic aorta at  111 HU's. Gantry rotation speed was 270 msecs and collimation was .9  mm. No beta blockade or nitro were given. The 3D data set was  reconstructed in 5% intervals of the R-R cycle. Systolic and  diastolic phases were analyzed on a dedicated work station using  MPR, MIP and VRT modes. The patient received 80 cc of contrast.  FINDINGS:  Aortic Valve: Severely calcified tricuspid aortic valve with  restricted leaflet opening  Aorta: Minimal calcification in the visualized position of the  ascending and descending thoracic aorta. Moderate calcifications in  the aortic arch that is not  completely visualized.  Sinotubular Junction: 27 x 29 mm  Ascending Thoracic Aorta: 36 x 36 mm  Descending Thoracic Aorta: 25 x 26 mm  Sinus of Valsalva Measurements:  Non-coronary: 31 mm  Right -coronary: 30 mm  Left -coronary: 33 mm  Coronary Artery Height above Annulus:  Left Main: 13 mm  Right Coronary: 13 mm  Virtual Basal Annulus Measurements:  Maximum/Minimum Diameter: 28 x 24 mm  Perimeter: 94 mm  Area: 526 mm2  Coronary Arteries: Originating in a regular position. Occluded RCA  at the origin. The study quality insufficient to evaluate for  coronary plague.  Optimum Fluoroscopic Angle for Delivery: RAO 0 CAU 0  Annulus to aortic angle distance 71 mm (for transaortic TAVR  delivery).  IMPRESSION:  1. Severely calcified aortic valve with measurements suitable for 26  mm Edward Sapien valve.  2. Sufficient annulus to coronary distance.  3. Optimum Fluoroscopic Angle for Delivery: RAO 0 CAU 0  Ena Dawley  Electronically Signed  By: Houston Siren  Meda Coffee  On: 10/20/2013 22:06       Study Result    EXAM:  OVER-READ INTERPRETATION CT CHEST  The following report is an over-read performed by radiologist Dr.  Julian Hy of [facility], [place] on 10/20/2013. This  over-read does not include interpretation of cardiac or coronary  anatomy or pathology. The coronary CTA interpretation by the  cardiologist is attached.  COMPARISON: None.  FINDINGS:  Mild linear scarring in the right middle lobe and lingula. Minimal  dependent atelectasis in the right lower lobe. No suspicious  pulmonary nodules.  No thoracic lymphadenopathy.  Visualized upper abdomen is unremarkable.  Degenerative changes of the visualized thoracic spine.  IMPRESSION:  No significant extracardiac findings.  Electronically Signed:  By: Julian Hy M.D.  On: 10/20/2013 17:14      STS Risk Calculator  Procedure    AVR  Risk of Mortality   1.7% Morbidity or Mortality  13.5% Prolonged  LOS   4.2% Short LOS    49.5% Permanent Stroke   1.1% Prolonged Vent Support  7.2% DSW Infection    0.2% Renal Failure    2.8% Reoperation    7.1%   Impression:  The patient has critical aortic stenosis with preserved left ventricular systolic function and no significant coronary artery disease. His physical activity is severely limited, but he does admit to a gradual progression of symptoms of exertional shortness of breath and fatigue consistent with chronic diastolic congestive heart failure, New York Heart Association functional class 1-2.  The patient has a long history of Parkinson's disease which has slowly progressed over the past 10 years. He is now very limited with very slow speech, limited mobility, and mild dementia. He is otherwise remarkably healthy. However, risks associated with conventional surgical aortic valve replacement would unquestionably be much higher than that predicted using traditional risk models because of the patient's underlying Parkinson's disease with limited mobility and dementia. Although his mortality risk may not be very high, he would be at particularly high-risk for major morbidity after conventional surgery, including the potential for stroke, pneumonia, respiratory failure, aspiration, and/or delirium.  Recovery from conventional surgery would likely come with a need for long-term placement in some type of rehab or skilled nursing facility.  Transcatheter aortic valve replacement may prove to be a very attractive alternative.  I have personally reviewed the patient's recent transthoracic echocardiogram, cardiac catheterization, and cardiac gated CT angiogram. The patient clearly has critical aortic stenosis. Although the patient may have a type I bicuspid valve with a fused raphe, cardiac gated CT angiogram of the heart suggests this valve may be tricuspid and clearly demonstrates anatomical findings that appear relatively favorable for deployment of a  transcatheter valve.   Plan:  The patient and his family were counseled at length regarding treatment alternatives for management of severe symptomatic aortic stenosis. Alternative approaches such as conventional aortic valve replacement, transcatheter aortic valve replacement, and palliative medical therapy were compared and contrasted at length.  Long-term prognosis with medical therapy was discussed including the possibility of continued medical therapy with very close follow-up.  With medical therapy the associated risks of the development of heart failure, ventricular dysfunction, syncope, and sudden death were discussed.  The risks associated with conventional surgical aortic valve replacement were discussed in detail, as were expectations for his post-operative convalescence. This discussion was placed in the context of the patient's own specific clinical presentation and past medical history.  All of their questions been addressed.  We plan to proceed  with formal physical therapy consultation to objectively characterize the patient's current cognitive and physical limitations. We will obtain CT angiogram of the chest, abdomen, and pelvis to determine whether or not the patient has suitable pelvic access for transfemoral approach for TAVR. The patient will return in 2 weeks to review these findings and discuss options further.   I spent in excess of 120 minutes during the conduct of this consultation and >50% of this time involved direct face-to-face encounter with the patient for counseling and/or coordination of their care.   Valentina Gu. Roxy Manns, MD 10/22/2013 2:59 PM

## 2013-10-22 NOTE — Patient Instructions (Signed)
Consider options for management of severe symptomatic aortic stenosis including:  1) conventional open heart surgery 2) transcatheter aortic valve replacement (TAVR) 3) continued medical therapy with close follow up

## 2013-10-23 ENCOUNTER — Other Ambulatory Visit: Payer: Self-pay | Admitting: *Deleted

## 2013-10-23 DIAGNOSIS — I359 Nonrheumatic aortic valve disorder, unspecified: Secondary | ICD-10-CM

## 2013-10-24 ENCOUNTER — Other Ambulatory Visit: Payer: Self-pay | Admitting: *Deleted

## 2013-10-24 DIAGNOSIS — I359 Nonrheumatic aortic valve disorder, unspecified: Secondary | ICD-10-CM

## 2013-10-31 ENCOUNTER — Ambulatory Visit (HOSPITAL_COMMUNITY)
Admission: RE | Admit: 2013-10-31 | Discharge: 2013-10-31 | Disposition: A | Discharge status: Home / Self Care | Payer: Medicare Other | Source: Ambulatory Visit | Attending: Thoracic Surgery (Cardiothoracic Vascular Surgery) | Admitting: Thoracic Surgery (Cardiothoracic Vascular Surgery)

## 2013-10-31 ENCOUNTER — Encounter (HOSPITAL_COMMUNITY): Payer: Self-pay

## 2013-10-31 ENCOUNTER — Ambulatory Visit: Payer: Medicare Other | Attending: Thoracic Surgery (Cardiothoracic Vascular Surgery) | Admitting: Physical Therapy

## 2013-10-31 DIAGNOSIS — R5381 Other malaise: Secondary | ICD-10-CM | POA: Diagnosis not present

## 2013-10-31 DIAGNOSIS — Z01818 Encounter for other preprocedural examination: Secondary | ICD-10-CM | POA: Diagnosis not present

## 2013-10-31 DIAGNOSIS — K4090 Unilateral inguinal hernia, without obstruction or gangrene, not specified as recurrent: Secondary | ICD-10-CM | POA: Diagnosis not present

## 2013-10-31 DIAGNOSIS — R29898 Other symptoms and signs involving the musculoskeletal system: Secondary | ICD-10-CM | POA: Diagnosis not present

## 2013-10-31 DIAGNOSIS — R5383 Other fatigue: Secondary | ICD-10-CM | POA: Diagnosis not present

## 2013-10-31 DIAGNOSIS — IMO0001 Reserved for inherently not codable concepts without codable children: Secondary | ICD-10-CM | POA: Diagnosis not present

## 2013-10-31 DIAGNOSIS — I359 Nonrheumatic aortic valve disorder, unspecified: Secondary | ICD-10-CM | POA: Insufficient documentation

## 2013-10-31 DIAGNOSIS — K4020 Bilateral inguinal hernia, without obstruction or gangrene, not specified as recurrent: Secondary | ICD-10-CM | POA: Diagnosis not present

## 2013-10-31 DIAGNOSIS — I517 Cardiomegaly: Secondary | ICD-10-CM | POA: Diagnosis not present

## 2013-10-31 MED ORDER — IOHEXOL 350 MG/ML SOLN
100.0000 mL | Freq: Once | INTRAVENOUS | Status: AC | PRN
  Administered 2013-10-31: 100 mL via INTRAVENOUS

## 2013-11-10 ENCOUNTER — Institutional Professional Consult (permissible substitution): Payer: Medicare Other | Admitting: Cardiology

## 2013-11-11 ENCOUNTER — Ambulatory Visit (INDEPENDENT_AMBULATORY_CARE_PROVIDER_SITE_OTHER): Payer: Medicare Other | Admitting: Cardiovascular Disease

## 2013-11-11 ENCOUNTER — Encounter: Payer: Self-pay | Admitting: Cardiovascular Disease

## 2013-11-11 VITALS — BP 105/61 | HR 79 | Ht 66.0 in | Wt 145.8 lb

## 2013-11-11 DIAGNOSIS — I359 Nonrheumatic aortic valve disorder, unspecified: Secondary | ICD-10-CM

## 2013-11-11 DIAGNOSIS — I350 Nonrheumatic aortic (valve) stenosis: Secondary | ICD-10-CM

## 2013-11-11 NOTE — Progress Notes (Signed)
HPI:   73 year old gentleman returning for further discussion of treatment options regarding his critical aortic stenosis. The patient has a long-standing heart murmur which was noted to be increased in intensity earlier this year. An echocardiogram demonstrated critical aortic stenosis with peak and mean transaortic valve gradients of 157 and 93 mm mercury, respectively. He is limited by Parkinson's disease, but has continued to live independently. Over recent weeks he has developed some chest tightness with exertion. He describes a pressure across the upper chest. He also has mild shortness of breath. He denies lightheadedness or syncope. He's had no leg swelling, orthopnea, or PND.  The patient has been evaluated extensively with cardiac catheterization, echocardiography, gated cardiac CTA, and CTA of the chest abdomen and pelvis. He has undergone formal cardiac surgical evaluation by Dr. Roxy Manns. He is felt to be high risk of major morbidity related to conventional open surgical aortic valve replacement because of his advanced Parkinson's disease. He is therefore being considered for transcatheter aortic valve replacement. He presents today with his daughter for further discussion now that all of his studies are completed.  Outpatient Encounter Prescriptions as of 11/11/2013  Medication Sig  . acetaminophen (TYLENOL) 500 MG tablet Take 1,000 mg by mouth 2 (two) times daily as needed (pain). For pain  . ASA-APAP-Caff Buffered (VANQUISH PO) Take 2 tablets by mouth daily as needed (pain). As needed  . aspirin 325 MG tablet Take 325 mg by mouth daily.  . butalbital-acetaminophen-caffeine (ESGIC PLUS) 50-500-40 MG per tablet Take 1 tablet by mouth every 4 (four) hours as needed (headaches).   . carbidopa-levodopa (SINEMET IR) 25-100 MG per tablet TAKE 1 AND 1/2 TABLETS BY MOUTH 3 (THREE) TIMES DAILY.  . cholecalciferol (VITAMIN D) 1000 UNITS tablet Take 1,000 Units by mouth daily.  . clonazePAM  (KLONOPIN) 1 MG tablet Take 1.5 mg by mouth daily.   Marland Kitchen LORazepam (ATIVAN) 0.5 MG tablet Take 0.5 mg by mouth daily with breakfast.   . omeprazole (PRILOSEC) 20 MG capsule Take 20 mg by mouth daily.  . sertraline (ZOLOFT) 50 MG tablet Take 50 mg by mouth daily.   . traMADol (ULTRAM) 50 MG tablet Take 50 mg by mouth daily as needed (pain).   . [DISCONTINUED] carbidopa-levodopa (SINEMET) 25-100 MG per tablet Take 1.5 tablets by mouth 3 (three) times daily.     Allergies  Allergen Reactions  . Vicodin [Hydrocodone-Acetaminophen] Other (See Comments)    "Mental reaction"    Past Medical History  Diagnosis Date  . Kidney stones   . Parkinson disease   . GERD (gastroesophageal reflux disease)   . Aortic stenosis   . Anxiety   . Headache(784.0)   . TIA (transient ischemic attack) 02/08/2007  . Parkinson's disease (tremor, stiffness, slow motion, unstable posture) 12/11/2006    ROS: Negative except as per HPI  BP 105/61  Pulse 79  Ht 5\' 6"  (1.676 m)  Wt 66.134 kg (145 lb 12.8 oz)  BMI 23.54 kg/m2  PHYSICAL EXAM: Pt is alert and oriented, WD, WN, in no distress. Parkinsonian features present with slow deliberate speech.  HEENT: normal  Neck: JVP normal. Carotid upstrokes delayed without bruits. No thyromegaly.  Lungs: equal expansion, clear bilaterally  CV: Apex is discrete and nondisplaced, RRR with grade 3/6 late peaking systolic murmur at the RUSB  Abd: soft, NT, +BS, no bruit, no hepatosplenomegaly  Back: no CVA tenderness  Ext: no C/C/E  DP/PT pulses intact and =  Skin: warm and dry without rash  Neuro: CNII-XII intact  Strength intact = bilaterally  CT Angio Chest/Abdomen/Pelvis: VASCULAR MEASUREMENTS PERTINENT TO TAVR:  AORTA:  Minimal Aortic Diameter - 16 x 16 mm  Severity of Aortic Calcification - Minimal  RIGHT PELVIS:  Right Common Iliac Artery -  Minimal Diameter - 11.2 x 11.5 mm  Tortuosity - Severe  Calcification - None  Right External Iliac Artery -    Minimal Diameter - 8.9 x 9.3 mm  Tortuosity - Moderate to severe  Calcification - None  Right Common Femoral Artery -  Minimal Diameter - 10.3 x 10.2 mm  Tortuosity - Minimal  Calcification - None  LEFT PELVIS:  Left Common Iliac Artery -  Minimal Diameter - 11.3 x 11.0 mm  Tortuosity - Moderate to severe  Calcification - None  Left External Iliac Artery -  Minimal Diameter - 9.6 x 9.2 mm  Tortuosity - Moderate  Calcification - None  Left Common Femoral Artery -  Minimal Diameter - 9.8 x 9.8 mm  Tortuosity - Minimal  Calcification - None  Review of the MIP images confirms the above findings.  IMPRESSION:  1. Findings and measurements pertinent to potential TAVR procedure,  as detailed above. From a size perspective, this patient appears to  have suitable pelvic arterial access, although there is significant  tortuosity in the iliac arteries bilaterally. This appears to be  less severe on the left side.  2. Large left inguinal hernia containing a significant portion of  the proximal sigmoid colon, which extends into the left hemiscrotum.  No signs of associated bowel obstruction at this time.  3. Cardiomegaly with concentric left ventricular hypertrophy.  4. Additional incidental findings, as above.   Gated Cardiac CTA: FINDINGS:  Aortic Valve: Severely calcified tricuspid aortic valve with  restricted leaflet opening  Aorta: Minimal calcification in the visualized position of the  ascending and descending thoracic aorta. Moderate calcifications in  the aortic arch that is not completely visualized.  Sinotubular Junction: 27 x 29 mm  Ascending Thoracic Aorta: 36 x 36 mm  Descending Thoracic Aorta: 25 x 26 mm  Sinus of Valsalva Measurements:  Non-coronary: 31 mm  Right -coronary: 30 mm  Left -coronary: 33 mm  Coronary Artery Height above Annulus:  Left Main: 13 mm  Right Coronary: 13 mm  Virtual Basal Annulus Measurements:  Maximum/Minimum Diameter: 28 x 24 mm   Perimeter: 94 mm  Area: 526 mm2  Coronary Arteries: Originating in a regular position. Occluded RCA  at the origin. The study quality insufficient to evaluate for  coronary plague.  Optimum Fluoroscopic Angle for Delivery: RAO 0 CAU 0  Annulus to aortic angle distance 71 mm (for transaortic TAVR  delivery).  IMPRESSION:  1. Severely calcified aortic valve with measurements suitable for 26  mm Edward Sapien valve.  2. Sufficient annulus to coronary distance.  3. Optimum Fluoroscopic Angle for Delivery: RAO 0 CAU 0   Cardiac Cath 10/09/2013: Procedural Findings:  Hemodynamics  RA 5  RV 45/7  PA 43/15 mean 26  PCWP 14  LV not recorded  AO 149/74 mean 104  Oxygen saturations:  PA 73  AO 99  SVC 76  Cardiac Output (Fick) 5.4  Cardiac Index (Fick) 3.1  Coronary angiography:  Coronary dominance: right  Left mainstem: Widely patent without obstructive disease  Left anterior descending (LAD): The proximal LAD is smooth without irregularity or obstructive disease. The first diagonal is large and widely patent. The LAD just beyond the diagonal has an eccentric 40% stenosis.  The remainder of the vessel is widely patent without disease.  Left circumflex (LCx): Large, dominant vessel. The OM, PLA, and PDA branches are all patent without obstructive disease.  Right coronary artery (RCA): medium caliber, nondominant vessel, supplying a single RV marginal branch. The vessel is smooth without obstructive disease.  Left ventriculography: Deferred (Ao valve was not crossed)  Aoritc root angiography: The aortic valve has extreme bulky calcification and limited mobility. The left cusp has restricted motion, the other cusp is immobile. There is 2-3+ AI.  Final Conclusions:  1. Patent coronary arteries (left dominant) with mild nonobstructive disease of the LAD  2. Severely calcified, restricted aortic valve (likely bicuspid) with at least moderate AI  3. Essentially normal right heart hemodynamics  except for mildly elevated PA pressure   2D Echo: Left ventricle: The cavity size was normal. Wall thickness was increased in a pattern of mild LVH. Systolic function was vigorous. The estimated ejection fraction was in the range of 65% to 70%. Doppler parameters are consistent with abnormal left ventricular relaxation (grade 1 diastolic dysfunction).  ------------------------------------------------------------ Aortic valve: AV is thickened, calcified with restricted motion. Peak and mean gradients through the valve are 157 and 93 mm Hg respectively consistent with critical AS> Doppler: Mild regurgitation. VTI ratio of LVOT to aortic valve: 0.16. Valve area: 0.59cm^2(VTI). Indexed valve area: 0.36cm^2/m^2 (VTI). Peak velocity ratio of LVOT to aortic valve: 0.16. Valve area: 0.6cm^2 (Vmax). Indexed valve area: 0.36cm^2/m^2 (Vmax). Mean gradient: 36mm Hg (S). Peak gradient: 113mm Hg (S).  ------------------------------------------------------------ Mitral valve: Calcified annulus. Leaflet separation was normal. Doppler: Transvalvular velocity was within the normal range. There was no evidence for stenosis. Trivial regurgitation. Peak gradient: 6mm Hg (D).  ------------------------------------------------------------ Left atrium: The atrium was mildly dilated.  ------------------------------------------------------------ Right ventricle: The cavity size was normal. Wall thickness was normal. Systolic function was normal.  ------------------------------------------------------------ Pulmonic valve: Structurally normal valve. Cusp separation was normal. Doppler: Transvalvular velocity was within the normal range. Trivial regurgitation.  ------------------------------------------------------------ Tricuspid valve: Structurally normal valve. Leaflet separation was normal. Doppler: Transvalvular velocity was within the normal range. Mild  regurgitation.  ------------------------------------------------------------ Right atrium: The atrium was normal in size.  ------------------------------------------------------------ Pericardium: There was no pericardial effusion.  ------------------------------------------------------------ Systemic veins: Inferior vena cava: The vessel was mildly dilated; the respirophasic diameter changes were in the normal range (= 50%).  STS Risk Calculator  Procedure AVR  Risk of Mortality 1.7%  Morbidity or Mortality 13.5%  Prolonged LOS 4.2%  Short LOS 49.5%  Permanent Stroke 1.1%  Prolonged Vent Support 7.2%  DSW Infection 0.2%  Renal Failure 2.8%  Reoperation 7.1%  ASSESSMENT AND PLAN: This is a 73 year old gentleman with severe symptomatic aortic stenosis. He has progressive symptoms of Parkinson's disease over the past 10 years and now has fairly marked functional limitation as well as mild dementia. His risks of mortality and major morbidity with open surgical aortic valve replacement are grossly underestimated by the STS risk calculator as the Parkinson's disease is not a captured variable. Transcatheter aortic valve replacement is a reasonable treatment alternative in this patient who has critical aortic stenosis. I have reviewed extensively the natural history and potential management options of severe aortic stenosis with the patient and his daughter. I have reviewed all of his radiographic and cardiac imaging data. He has a suitable aortic valve annulus for the currently available Edwards Sapien XT valve. The patient also has suitable pelvic anatomy with tortuous but large, non-calcified iliofemoral vessels. His aortic annular size is borderline between a  26 mm and 29 mm valve. He does have a very severely calcified left coronary cusp with bulky calcification best seen on plain fluoroscopy. His left main height from the aortic annulus is 13 mm. I think there is a modestly increased risk  of left mainstem obstruction and would consider aortography during balloon inflation as part of the TAVR procedure. I have discussed this specific potential complication with the patient today. Other risks include but are not limited to vascular injury, infection, arrhythmia, myocardial infarction, stroke, valve embolization, cardiac perforation, need for emergency sternotomy, leg valve dysfunction, perivalvular leak, and death. All risks have been reviewed extensively and the patient and his daughter understand. They wish to proceed at the next available time. The patient will require a second cardiac surgical consultation and further preoperative lab work. He is tentatively scheduled for transfemoral TAVR July 7.  Sherren Mocha 11/11/2013 5:39 PM

## 2013-11-11 NOTE — Patient Instructions (Signed)
Will proceed with TAVR.

## 2013-11-12 ENCOUNTER — Other Ambulatory Visit: Payer: Self-pay | Admitting: *Deleted

## 2013-11-12 DIAGNOSIS — I359 Nonrheumatic aortic valve disorder, unspecified: Secondary | ICD-10-CM

## 2013-11-13 ENCOUNTER — Encounter (HOSPITAL_COMMUNITY): Payer: Self-pay | Admitting: Pharmacy Technician

## 2013-11-13 NOTE — Pre-Procedure Instructions (Signed)
[name].  11/13/2013   Your procedure is scheduled on: Tuesday, November 18, 2013  Report to [facility] Admitting at 9:30 AM.  Call this number if you have problems the morning of surgery: [phone]   Remember:Bring incentive spirometer and pt education booklet on the day of surgery   Do not eat food or drink liquids after midnight Monday, November 17, 2013   Take these medicines the morning of surgery with A SIP OF WATER: carbidopa-levodopa (SINEMET  LORazepam (ATIVAN), sertraline (ZOLOFT) if needed:traMADol (ULTRAM)  OR Tylenol for pain  Do not take any NSAIDs ie: Ibuprofen, Advil, Naproxen or any medication containing Aspirin ( ASA-APAP-Caff Buffered (VANQUISH PO),   Do not wear jewelry, make-up or nail polish.  Do not wear lotions, powders, or perfumes. You may NOT wear deodorant.  Do not shave 48 hours prior to surgery. Men may shave face and neck.  Do not bring valuables to the hospital.  [facility] is not responsible for any belongings or valuables.               Contacts, dentures or bridgework may not be worn into surgery.  Leave suitcase in the car. After surgery it may be brought to your room.  For patients admitted to the hospital, discharge time is determined by your treatment team.               Patients discharged the day of surgery will not be allowed to drive home.  Name and phone number of your driver:   Special Instructions:  Special Instructions:Special Instructions: [facility] - Preparing for Surgery  Before surgery, you can play an important role.  Because skin is not sterile, your skin needs to be as free of germs as possible.  You can reduce the number of germs on you skin by washing with CHG (chlorahexidine gluconate) soap before surgery.  CHG is an antiseptic cleaner which kills germs and bonds with the skin to continue killing germs even after washing.  Please DO NOT use if you have an allergy to CHG or antibacterial soaps.  If your skin  becomes reddened/irritated stop using the CHG and inform your nurse when you arrive at Short Stay.  Do not shave (including legs and underarms) for at least 48 hours prior to the first CHG shower.  You may shave your face.  Please follow these instructions carefully:   1.  Shower with CHG Soap the night before surgery and the morning of Surgery.  2.  If you choose to wash your hair, wash your hair first as usual with your normal shampoo.  3.  After you shampoo, rinse your hair and body thoroughly to remove the Shampoo.  4.  Use CHG as you would any other liquid soap.  You can apply chg directly  to the skin and wash gently with scrungie or a clean washcloth.  5.  Apply the CHG Soap to your body ONLY FROM THE NECK DOWN.  Do not use on open wounds or open sores.  Avoid contact with your eyes, ears, mouth and genitals (private parts).  Wash genitals (private parts) with your normal soap.  6.  Wash thoroughly, paying special attention to the area where your surgery will be performed.  7.  Thoroughly rinse your body with warm water from the neck down.  8.  DO NOT shower/wash with your normal soap after using and rinsing off the CHG Soap.  9.  Pat yourself dry with a  clean towel.            10.  Wear clean pajamas.            11.  Place clean sheets on your bed the night of your first shower and do not sleep with pets.  Day of Surgery  Do not apply any lotions/deodorants the morning of surgery.  Please wear clean clothes to the hospital/surgery center.   Please read over the following fact sheets that you were given: Pain Booklet, Coughing and Deep Breathing, Blood Transfusion Information, Open Heart Packet, MRSA Information and Surgical Site Infection Prevention

## 2013-11-17 ENCOUNTER — Institutional Professional Consult (permissible substitution) (INDEPENDENT_AMBULATORY_CARE_PROVIDER_SITE_OTHER): Payer: Medicare Other | Admitting: Surgery

## 2013-11-17 ENCOUNTER — Encounter (HOSPITAL_COMMUNITY): Payer: Self-pay

## 2013-11-17 ENCOUNTER — Encounter: Payer: Self-pay | Admitting: Surgery

## 2013-11-17 ENCOUNTER — Ambulatory Visit (HOSPITAL_COMMUNITY)
Admission: RE | Admit: 2013-11-17 | Discharge: 2013-11-17 | Disposition: A | Discharge status: Home / Self Care | Payer: Medicare Other | Source: Ambulatory Visit | Attending: Cardiovascular Disease | Admitting: Cardiovascular Disease

## 2013-11-17 ENCOUNTER — Encounter (HOSPITAL_COMMUNITY)
Admission: RE | Admit: 2013-11-17 | Discharge: 2013-11-17 | Disposition: A | Discharge status: Home / Self Care | Payer: Medicare Other | Source: Ambulatory Visit | Attending: Cardiovascular Disease | Admitting: Cardiovascular Disease

## 2013-11-17 ENCOUNTER — Inpatient Hospital Stay (HOSPITAL_COMMUNITY)
Admission: RE | Admit: 2013-11-17 | Discharge: 2013-11-21 | DRG: 267 | Disposition: A | Discharge status: Home Health | Payer: Medicare Other | Source: Ambulatory Visit | Attending: Cardiovascular Disease | Admitting: Cardiovascular Disease

## 2013-11-17 VITALS — BP 119/66 | HR 61 | Temp 98.3°F | Resp 20 | Ht 66.0 in | Wt 145.0 lb

## 2013-11-17 VITALS — BP 116/65 | HR 83 | Resp 16 | Ht 66.0 in | Wt 144.0 lb

## 2013-11-17 DIAGNOSIS — I350 Nonrheumatic aortic (valve) stenosis: Secondary | ICD-10-CM

## 2013-11-17 DIAGNOSIS — G20A1 Parkinson's disease without dyskinesia, without mention of fluctuations: Secondary | ICD-10-CM | POA: Diagnosis present

## 2013-11-17 DIAGNOSIS — Z006 Encounter for examination for normal comparison and control in clinical research program: Secondary | ICD-10-CM

## 2013-11-17 DIAGNOSIS — I359 Nonrheumatic aortic valve disorder, unspecified: Principal | ICD-10-CM | POA: Diagnosis present

## 2013-11-17 DIAGNOSIS — R918 Other nonspecific abnormal finding of lung field: Secondary | ICD-10-CM | POA: Diagnosis not present

## 2013-11-17 DIAGNOSIS — Y9210 Unspecified residential institution as the place of occurrence of the external cause: Secondary | ICD-10-CM | POA: Diagnosis not present

## 2013-11-17 DIAGNOSIS — Z952 Presence of prosthetic heart valve: Secondary | ICD-10-CM

## 2013-11-17 DIAGNOSIS — Y831 Surgical operation with implant of artificial internal device as the cause of abnormal reaction of the patient, or of later complication, without mention of misadventure at the time of the procedure: Secondary | ICD-10-CM | POA: Diagnosis not present

## 2013-11-17 DIAGNOSIS — I517 Cardiomegaly: Secondary | ICD-10-CM | POA: Diagnosis not present

## 2013-11-17 DIAGNOSIS — Z7982 Long term (current) use of aspirin: Secondary | ICD-10-CM

## 2013-11-17 DIAGNOSIS — I509 Heart failure, unspecified: Secondary | ICD-10-CM | POA: Diagnosis present

## 2013-11-17 DIAGNOSIS — G3183 Dementia with Lewy bodies: Secondary | ICD-10-CM

## 2013-11-17 DIAGNOSIS — Z885 Allergy status to narcotic agent status: Secondary | ICD-10-CM

## 2013-11-17 DIAGNOSIS — Z79899 Other long term (current) drug therapy: Secondary | ICD-10-CM

## 2013-11-17 DIAGNOSIS — G20 Parkinson's disease: Secondary | ICD-10-CM | POA: Diagnosis present

## 2013-11-17 DIAGNOSIS — Z602 Problems related to living alone: Secondary | ICD-10-CM | POA: Diagnosis not present

## 2013-11-17 DIAGNOSIS — D6959 Other secondary thrombocytopenia: Secondary | ICD-10-CM | POA: Diagnosis present

## 2013-11-17 DIAGNOSIS — D62 Acute posthemorrhagic anemia: Secondary | ICD-10-CM | POA: Diagnosis not present

## 2013-11-17 DIAGNOSIS — Z87442 Personal history of urinary calculi: Secondary | ICD-10-CM

## 2013-11-17 DIAGNOSIS — F0280 Dementia in other diseases classified elsewhere without behavioral disturbance: Secondary | ICD-10-CM | POA: Diagnosis present

## 2013-11-17 DIAGNOSIS — R0602 Shortness of breath: Secondary | ICD-10-CM | POA: Diagnosis not present

## 2013-11-17 DIAGNOSIS — Z8673 Personal history of transient ischemic attack (TIA), and cerebral infarction without residual deficits: Secondary | ICD-10-CM

## 2013-11-17 DIAGNOSIS — I5032 Chronic diastolic (congestive) heart failure: Secondary | ICD-10-CM | POA: Diagnosis not present

## 2013-11-17 DIAGNOSIS — F411 Generalized anxiety disorder: Secondary | ICD-10-CM | POA: Diagnosis present

## 2013-11-17 DIAGNOSIS — Z7902 Long term (current) use of antithrombotics/antiplatelets: Secondary | ICD-10-CM

## 2013-11-17 DIAGNOSIS — J9819 Other pulmonary collapse: Secondary | ICD-10-CM | POA: Diagnosis not present

## 2013-11-17 DIAGNOSIS — G2581 Restless legs syndrome: Secondary | ICD-10-CM | POA: Diagnosis present

## 2013-11-17 DIAGNOSIS — J988 Other specified respiratory disorders: Secondary | ICD-10-CM | POA: Diagnosis not present

## 2013-11-17 DIAGNOSIS — K219 Gastro-esophageal reflux disease without esophagitis: Secondary | ICD-10-CM | POA: Diagnosis present

## 2013-11-17 DIAGNOSIS — Z954 Presence of other heart-valve replacement: Secondary | ICD-10-CM | POA: Diagnosis not present

## 2013-11-17 HISTORY — DX: Other specified postprocedural states: Z98.890

## 2013-11-17 HISTORY — DX: Cardiac murmur, unspecified: R01.1

## 2013-11-17 HISTORY — DX: Restless legs syndrome: G25.81

## 2013-11-17 HISTORY — DX: Presence of prosthetic heart valve: Z95.2

## 2013-11-17 HISTORY — DX: Nausea with vomiting, unspecified: R11.2

## 2013-11-17 HISTORY — DX: Bilateral inguinal hernia, without obstruction or gangrene, recurrent: K40.21

## 2013-11-17 LAB — BLOOD GAS, ARTERIAL
ACID-BASE EXCESS: 2.4 mmol/L — AB (ref 0.0–2.0)
BICARBONATE: 26.6 meq/L — AB (ref 20.0–24.0)
Drawn by: 206361
FIO2: 0.21 %
O2 Saturation: 97.4 %
PCO2 ART: 41.8 mmHg (ref 35.0–45.0)
PH ART: 7.419 (ref 7.350–7.450)
Patient temperature: 98.6
TCO2: 27.8 mmol/L (ref 0–100)
pO2, Arterial: 74.1 mmHg — ABNORMAL LOW (ref 80.0–100.0)

## 2013-11-17 LAB — COMPREHENSIVE METABOLIC PANEL
ALT: 14 U/L (ref 0–53)
ANION GAP: 14 (ref 5–15)
AST: 20 U/L (ref 0–37)
Albumin: 4 g/dL (ref 3.5–5.2)
Alkaline Phosphatase: 99 U/L (ref 39–117)
BILIRUBIN TOTAL: 0.4 mg/dL (ref 0.3–1.2)
BUN: 20 mg/dL (ref 6–23)
CHLORIDE: 104 meq/L (ref 96–112)
CO2: 23 meq/L (ref 19–32)
CREATININE: 0.9 mg/dL (ref 0.50–1.35)
Calcium: 9 mg/dL (ref 8.4–10.5)
GFR, EST NON AFRICAN AMERICAN: 82 mL/min — AB (ref >90)
GLUCOSE: 148 mg/dL — AB (ref 70–99)
POTASSIUM: 4.3 meq/L (ref 3.7–5.3)
Sodium: 141 mEq/L (ref 137–147)
Total Protein: 6.7 g/dL (ref 6.0–8.3)

## 2013-11-17 LAB — PULMONARY FUNCTION TEST
DL/VA % PRED: 179 %
DL/VA: 7.63 ml/min/mmHg/L
DLCO COR % PRED: 123 %
DLCO cor: 31.74 ml/min/mmHg
DLCO unc % pred: 123 %
DLCO unc: 31.74 ml/min/mmHg
FEF 25-75 POST: 2.67 L/s
FEF 25-75 Pre: 2.14 L/sec
FEF2575-%Change-Post: 24 %
FEF2575-%Pred-Post: 144 %
FEF2575-%Pred-Pre: 115 %
FEV1-%Change-Post: 3 %
FEV1-%PRED-POST: 94 %
FEV1-%Pred-Pre: 91 %
FEV1-POST: 2.38 L
FEV1-Pre: 2.3 L
FEV1FVC-%Change-Post: 0 %
FEV1FVC-%PRED-PRE: 109 %
FEV6-%Change-Post: 3 %
FEV6-%PRED-PRE: 87 %
FEV6-%Pred-Post: 90 %
FEV6-Post: 2.92 L
FEV6-Pre: 2.84 L
FEV6FVC-%Change-Post: 0 %
FEV6FVC-%PRED-POST: 105 %
FEV6FVC-%Pred-Pre: 105 %
FVC-%Change-Post: 3 %
FVC-%Pred-Post: 85 %
FVC-%Pred-Pre: 82 %
FVC-PRE: 2.88 L
FVC-Post: 2.97 L
PRE FEV6/FVC RATIO: 98 %
Post FEV1/FVC ratio: 80 %
Post FEV6/FVC ratio: 98 %
Pre FEV1/FVC ratio: 80 %
RV % PRED: 80 %
RV: 1.81 L
TLC % pred: 80 %
TLC: 4.83 L

## 2013-11-17 LAB — URINALYSIS, ROUTINE W REFLEX MICROSCOPIC
BILIRUBIN URINE: NEGATIVE
GLUCOSE, UA: NEGATIVE mg/dL
Hgb urine dipstick: NEGATIVE
Ketones, ur: NEGATIVE mg/dL
Leukocytes, UA: NEGATIVE
NITRITE: NEGATIVE
PH: 6.5 (ref 5.0–8.0)
Protein, ur: NEGATIVE mg/dL
Specific Gravity, Urine: 1.024 (ref 1.005–1.030)
Urobilinogen, UA: 1 mg/dL (ref 0.0–1.0)

## 2013-11-17 LAB — CBC
HEMATOCRIT: 39.2 % (ref 39.0–52.0)
HEMOGLOBIN: 13 g/dL (ref 13.0–17.0)
MCH: 29.2 pg (ref 26.0–34.0)
MCHC: 33.2 g/dL (ref 30.0–36.0)
MCV: 88.1 fL (ref 78.0–100.0)
Platelets: 104 10*3/uL — ABNORMAL LOW (ref 150–400)
RBC: 4.45 MIL/uL (ref 4.22–5.81)
RDW: 13.5 % (ref 11.5–15.5)
WBC: 5.9 10*3/uL (ref 4.0–10.5)

## 2013-11-17 LAB — PROTIME-INR
INR: 1.15 (ref 0.00–1.49)
Prothrombin Time: 14.7 seconds (ref 11.6–15.2)

## 2013-11-17 LAB — APTT: aPTT: 33 seconds (ref 24–37)

## 2013-11-17 LAB — ABO/RH: ABO/RH(D): O POS

## 2013-11-17 LAB — SURGICAL PCR SCREEN
MRSA, PCR: NEGATIVE
STAPHYLOCOCCUS AUREUS: NEGATIVE

## 2013-11-17 LAB — HEMOGLOBIN A1C
HEMOGLOBIN A1C: 5.7 % — AB (ref <5.7)
Mean Plasma Glucose: 117 mg/dL — ABNORMAL HIGH (ref <117)

## 2013-11-17 MED ORDER — POTASSIUM CHLORIDE 2 MEQ/ML IV SOLN
80.0000 meq | INTRAVENOUS | Status: DC
Start: 2013-11-18 — End: 2013-11-18
  Filled 2013-11-17: qty 40

## 2013-11-17 MED ORDER — SODIUM CHLORIDE 0.9 % IV SOLN
INTRAVENOUS | Status: DC
  Filled 2013-11-17: qty 30

## 2013-11-17 MED ORDER — CHLORHEXIDINE GLUCONATE 4 % EX LIQD: 30.0000 mL | CUTANEOUS | Status: DC

## 2013-11-17 MED ORDER — EPINEPHRINE HCL 1 MG/ML IJ SOLN
0.5000 ug/min | INTRAVENOUS | Status: DC
  Filled 2013-11-17: qty 4

## 2013-11-17 MED ORDER — DEXMEDETOMIDINE HCL IN NACL 400 MCG/100ML IV SOLN
0.1000 ug/kg/h | INTRAVENOUS | Status: DC
  Filled 2013-11-17: qty 100

## 2013-11-17 MED ORDER — NITROGLYCERIN IN D5W 200-5 MCG/ML-% IV SOLN
2.0000 ug/min | INTRAVENOUS | Status: AC
  Administered 2013-11-18: 17 ug/min via INTRAVENOUS
  Filled 2013-11-17: qty 250

## 2013-11-17 MED ORDER — NOREPINEPHRINE BITARTRATE 1 MG/ML IV SOLN
0.0000 ug/min | INTRAVENOUS | Status: DC
  Filled 2013-11-17: qty 8

## 2013-11-17 MED ORDER — PHENYLEPHRINE HCL 10 MG/ML IJ SOLN
30.0000 ug/min | INTRAMUSCULAR | Status: AC
  Administered 2013-11-18: 50 ug/min via INTRAVENOUS
  Filled 2013-11-17: qty 2

## 2013-11-17 MED ORDER — SODIUM CHLORIDE 0.9 % IV SOLN
INTRAVENOUS | Status: DC
  Filled 2013-11-17: qty 1

## 2013-11-17 MED ORDER — SODIUM CHLORIDE 0.9 % IV SOLN: INTRAVENOUS | Status: DC

## 2013-11-17 MED ORDER — VANCOMYCIN HCL 10 G IV SOLR
1250.0000 mg | INTRAVENOUS | Status: AC
  Administered 2013-11-18: 1250 mg via INTRAVENOUS
  Filled 2013-11-17 (×2): qty 1250

## 2013-11-17 MED ORDER — DEXTROSE 5 % IV SOLN
1.5000 g | INTRAVENOUS | Status: AC
  Administered 2013-11-18: 1.5 g via INTRAVENOUS
  Filled 2013-11-17: qty 1.5

## 2013-11-17 MED ORDER — DOPAMINE-DEXTROSE 3.2-5 MG/ML-% IV SOLN
2.0000 ug/kg/min | INTRAVENOUS | Status: DC
  Filled 2013-11-17: qty 250

## 2013-11-17 MED ORDER — MAGNESIUM SULFATE 50 % IJ SOLN
40.0000 meq | INTRAMUSCULAR | Status: DC
  Filled 2013-11-17: qty 10

## 2013-11-17 MED ORDER — ALBUTEROL SULFATE (2.5 MG/3ML) 0.083% IN NEBU
2.5000 mg | INHALATION_SOLUTION | Freq: Once | RESPIRATORY_TRACT | Status: AC
  Administered 2013-11-17: 2.5 mg via RESPIRATORY_TRACT

## 2013-11-17 NOTE — Progress Notes (Signed)
Patient ID: [name]., male   DOB: [birth year], 73 y.o.   MRN: [id number]   [place] [facility] CONSULTATION REPORT  Referring Provider is Candee Furbish, MD PCP is Horton Finer, MD  Chief Complaint  Patient presents with  . Aortic Stenosis    for TAVR EVAL CONSULT...Dr. Sherren Mocha referring    HPI:  The patient is a 73 year old gentleman with a history of aortic stenosis and Parkinson's disease who saw Dr. Maxwell Caul recently for preop clearance to have a right inguinal hernia repair. His murmur was different and he had a followup echo on 09/12/2013 that showed progression of his aortic stenosis with a peak velocity of 6 m/s with a peak gradient of 157 mm Hg and a mean gradient of 93 mm Hg. LVEF was normal at 65%. He was evaluated by Dr. Marlou Porch and then Dr. Burt Knack for consideration of TAVR. He had a left and right heart cath that showed mild, nonobstructive coronary disease.  He reports occasional tightness across his chest with exertion as well as mild shortness of breath and fatigue. He was mowing his grass with a push mower until a few months ago. He was diagnosed with Parkinson's disease in 2008 and has had gradual progression of slowed speech, memory loss, confusion and decreased mobility. He still lives alone but has children and a sister that live close by and are with him constantly.   Past Medical History  Diagnosis Date  . Kidney stones   . Parkinson disease   . GERD (gastroesophageal reflux disease)   . Aortic stenosis   . Anxiety   . Headache(784.0)   . TIA (transient ischemic attack) 02/08/2007  . Parkinson's disease (tremor, stiffness, slow motion, unstable posture) 12/11/2006  . PONV (postoperative nausea and vomiting)   . Heart murmur   . Hernia, inguinal, bilateral, recurrent   . Restless leg syndrome     Past Surgical History  Procedure Laterality Date  . Cataract  2008    bilateral  . Cystoscopy/retrograde/ureteroscopy  05/29/2011    Procedure: CYSTOSCOPY/RETROGRADE/URETEROSCOPY;  Surgeon: Malka So, MD;  Location: [facility];  Service: Urology;  Laterality: Left;  no stent   . Colonoscopy    . Cardiac catheterization  10/09/13    History reviewed. No pertinent family history.  History   Social History  . Marital Status: Married    Spouse Name: N/A    Number of Children: N/A  . Years of Education: N/A   Occupational History  . Not on file.   Social History Main Topics  . Smoking status: Never Smoker   . Smokeless tobacco: Never Used  . Alcohol Use: No  . Drug Use: No  . Sexual Activity: Not on file   Other Topics Concern  . Not on file   Social History Narrative  . No narrative on file    Current Outpatient Prescriptions  Medication Sig Dispense Refill  . acetaminophen (TYLENOL) 500 MG tablet Take 1,000 mg by mouth 2 (two) times daily as needed (pain). For pain      . ASA-APAP-Caff Buffered (VANQUISH PO) Take 2 tablets by mouth daily as needed (pain / headache). As needed      . aspirin 325 MG tablet Take 325 mg by mouth daily.      . butalbital-acetaminophen-caffeine (ESGIC PLUS) 50-500-40 MG per tablet Take 1 tablet by mouth every 4 (four) hours as needed (headaches).       Marland Kitchen  carbidopa-levodopa (SINEMET IR) 25-100 MG per tablet Take 1.5 tablets by mouth See admin instructions. Take 1 and 1/2 tablets at 10am, 2pm and 6pm.      . cholecalciferol (VITAMIN D) 1000 UNITS tablet Take 1,000 Units by mouth at bedtime.       . clonazePAM (KLONOPIN) 1 MG tablet Take 1.5 mg by mouth at bedtime.       Marland Kitchen LORazepam (ATIVAN) 0.5 MG tablet Take 0.5 mg by mouth daily.       Marland Kitchen omeprazole (PRILOSEC) 20 MG capsule Take 20 mg by mouth at bedtime.       . sertraline (ZOLOFT) 50 MG tablet Take 50 mg by mouth daily.       . traMADol (ULTRAM) 50 MG tablet Take 50 mg by mouth daily as needed (pain).        No current facility-administered medications for this  visit.   Facility-Administered Medications Ordered in Other Visits  Medication Dose Route Frequency Provider Last Rate Last Dose  . chlorhexidine (HIBICLENS) 4 % liquid 2 application  30 mL Topical UD Sherren Mocha, MD        Allergies  Allergen Reactions  . Vicodin [Hydrocodone-Acetaminophen] Other (See Comments)    "Mental reaction"    Review of Systems:  General: normal appetite, decreased energy, no weight gain, no weight loss, no fever  Cardiac: + chest pain with exertion, no chest pain at rest, + SOB with exertion, no resting SOB, no PND, no orthopnea, no palpitations, no arrhythmia, no atrial fibrillation, no LE edema, no dizzy spells, no syncope  Respiratory: + exertional shortness of breath, no home oxygen, no productive cough, no dry cough, no bronchitis, no wheezing, no hemoptysis, no asthma, no pain with inspiration or cough, no sleep apnea, no CPAP at night  GI: no difficulty swallowing, + reflux, + frequent heartburn, + hiatal hernia, no abdominal pain, no constipation, no diarrhea, no hematochezia, no hematemesis, no melena  GU: no dysuria, no frequency, no urinary tract infection, no hematuria, no enlarged prostate, + kidney stones, no kidney disease  Vascular: no pain suggestive of claudication, no pain in feet, occasional leg cramps, no varicose veins, no DVT, no non-healing foot ulcer  Neuro: no stroke, + TIA's, no seizures, + headaches, no temporary blindness one eye, + slurred speech, no peripheral neuropathy, no chronic pain, + instability of gait, + memory/cognitive dysfunction  Musculoskeletal: + arthritis, no joint swelling, no myalgias, + difficulty walking, limited mobility  Skin: no rash, no itching, no skin infections, no pressure sores or ulcerations  Psych: no anxiety, + depression, no nervousness, + unusual recent stress  Eyes: + blurry vision, no floaters, no recent vision changes, + wears glasses or contacts  ENT: no hearing loss, no loose or painful teeth,  no dentures, last saw dentist last year  Hematologic: no easy bruising, no abnormal bleeding, no clotting disorder, no frequent epistaxis  Endocrine: no diabetes, does not check CBG's at home      Physical Exam:   BP 116/65  Pulse 83  Resp 16  Ht 5\' 6"  (1.676 m)  Wt 144 lb (65.318 kg)  BMI 23.25 kg/m2  SpO2 97%  General:  Frail-appearing elderly gentleman who looks tired  HEENT:  Unremarkable   Neck:   no JVD, no bruits, no adenopathy , transmitted murmur to both carotids  Chest:   clear to auscultation, symmetrical breath sounds, no wheezes, no rhonchi   CV:   RRR, grade III/VI crescendo/decrescendo murmur heard best at right  upper sternal border, II/VI  diastolic                                                 murmur at the apex  Abdomen:  soft, non-tender, no masses   Extremities:  warm, well-perfused, pulses palpable bilaterally, mild LE edema  Rectal/GU  Deferred  Neuro:   Grossly non-focal and symmetrical throughout, slowed speech and mobility  Skin:   Clean and dry, no rashes, no breakdown   Diagnostic Tests:  Transthoracic Echocardiography  Patient: Ulric, Salzman MR #: [id number] Study Date: 09/12/2013 Gender: M Age: 41 Height: 157.5cm Weight: 64.4kg BSA: 1.72m^2 Pt. Status: Room:  ATTENDING Jaymes Graff, [place], Outpatient cc:  ------------------------------------------------------------ LV EF: 65% - 70%  ------------------------------------------------------------ Indications: 424.1 Aortic valve disorders.  ------------------------------------------------------------ History: PMH: Bicuspid aortic valve. Acquired from the patient and from the patient's chart. Dyspnea. Aortic stenosis.  ------------------------------------------------------------ Study Conclusions  - Left ventricle: The cavity size was normal. Wall thickness was increased in a  pattern of mild LVH. Systolic function was vigorous. The estimated ejection fraction was in the range of 65% to 70%. Doppler parameters are consistent with abnormal left ventricular relaxation (grade 1 diastolic dysfunction). - Aortic valve: AV is thickened, calcified with restricted motion. Peak and mean gradients through the valve are 157 and 93 mm Hg respectively consistent with critical AS> Mild regurgitation. - Pulmonary arteries: PA peak pressure: 69mm Hg (S). Transthoracic echocardiography. M-mode, complete 2D, spectral Doppler, and color Doppler. Height: Height: 157.5cm. Height: 62in. Weight: Weight: 64.4kg. Weight: 141.7lb. Body mass index: BMI: 26kg/m^2. Body surface area: BSA: 1.34m^2. Blood pressure: 118/80. Patient status: Outpatient. Location: [facility]  ------------------------------------------------------------  ------------------------------------------------------------ Left ventricle: The cavity size was normal. Wall thickness was increased in a pattern of mild LVH. Systolic function was vigorous. The estimated ejection fraction was in the range of 65% to 70%. Doppler parameters are consistent with abnormal left ventricular relaxation (grade 1 diastolic dysfunction).  ------------------------------------------------------------ Aortic valve: AV is thickened, calcified with restricted motion. Peak and mean gradients through the valve are 157 and 93 mm Hg respectively consistent with critical AS> Doppler: Mild regurgitation. VTI ratio of LVOT to aortic valve: 0.16. Valve area: 0.59cm^2(VTI). Indexed valve area: 0.36cm^2/m^2 (VTI). Peak velocity ratio of LVOT to aortic valve: 0.16. Valve area: 0.6cm^2 (Vmax). Indexed valve area: 0.36cm^2/m^2 (Vmax). Mean gradient: 91mm Hg (S). Peak gradient: 128mm Hg (S).  ------------------------------------------------------------ Mitral valve: Calcified annulus. Leaflet separation was normal. Doppler: Transvalvular  velocity was within the normal range. There was no evidence for stenosis. Trivial regurgitation. Peak gradient: 55mm Hg (D).  ------------------------------------------------------------ Left atrium: The atrium was mildly dilated.  ------------------------------------------------------------ Right ventricle: The cavity size was normal. Wall thickness was normal. Systolic function was normal.  ------------------------------------------------------------ Pulmonic valve: Structurally normal valve. Cusp separation was normal. Doppler: Transvalvular velocity was within the normal range. Trivial regurgitation.  ------------------------------------------------------------ Tricuspid valve: Structurally normal valve. Leaflet separation was normal. Doppler: Transvalvular velocity was within the normal range. Mild regurgitation.  ------------------------------------------------------------ Right atrium: The atrium was normal in size.  ------------------------------------------------------------ Pericardium: There was no pericardial effusion.  ------------------------------------------------------------ Systemic veins: Inferior vena cava: The vessel was mildly dilated; the respirophasic diameter changes were in the normal range (= 50%).  ------------------------------------------------------------  2D measurements Normal Doppler measurements Normal Left ventricle Main  pulmonary LVID ED, 52 mm 43-52 artery chord, Pressure, 33 mm Hg =30 PLAX S LVID ES, 35 mm 23-38 Left ventricle chord, Ea, lat 6.31 cm/s ------ PLAX ann, tiss FS, chord, 33 % >29 DP PLAX E/Ea, lat 13.3 ------ LVPW, ED 12 mm ------ ann, tiss IVS/LVPW 1.08 <1.3 DP ratio, ED Ea, med 5.55 cm/s ------ Ventricular septum ann, tiss IVS, ED 13 mm ------ DP LVOT E/Ea, med 15.1 ------ Diam, S 22 mm ------ ann, tiss 2 Area 3.8 cm^2 ------ DP Diam 22 mm ------ LVOT Aorta Peak vel, 97.9 cm/s ------ Root diam, 31 mm ------  S ED VTI, S 21.7 cm ------ AAo AP 36 mm ------ Stroke vol 82.5 ml ------ diam, S Stroke 50 ml/m^2 ------ Left atrium index AP dim 45 mm ------ Aortic valve AP dim 2.73 cm/m^2 <2.2 Peak vel, 620 cm/s ------ index S Mean vel, 427 cm/s ------ S VTI, S 139 cm ------ Mean 85 mm Hg ------ gradient, S Peak 154 mm Hg ------ gradient, S VTI ratio 0.16 ------ LVOT/AV Area, VTI 0.59 cm^2 ------ Area index 0.36 cm^2/m ------ (VTI) ^2 Peak vel 0.16 ------ ratio, LVOT/AV Area, Vmax 0.6 cm^2 ------ Area index 0.36 cm^2/m ------ (Vmax) ^2 Regurg PHT 287 ms ------ Mitral valve Peak E vel 83.9 cm/s ------ Peak A vel 110 cm/s ------ Decelerati 292 ms 150-23 on time 0 Peak 3 mm Hg ------ gradient, D Peak E/A 0.8 ------ ratio Tricuspid valve Regurg 276 cm/s ------ peak vel Peak RV-RA 30 mm Hg ------ gradient, S Systemic veins Estimated 3 mm Hg ------ CVP Right ventricle Pressure, 33 mm Hg <30 S Sa vel, 15 cm/s ------ lat ann, tiss DP  ------------------------------------------------------------ Prepared and Electronically Authenticated by  Dorris Carnes [id number]:[id number]        Cardiac Catheterization Procedure Note   Name: [name].  MRN: [id number]  DOB: 09/18/1940  Procedure: Right Heart Cath, Selective Coronary Angiography, Aortic root angiography  Indication: Severe aortic stenosis  Procedural Details: The right groin was prepped, draped, and anesthetized with 1% lidocaine. Using the modified Seldinger technique a 4 French sheath was placed in the right femoral artery and a 7 French sheath was placed in the right femoral vein. A Swan-Ganz catheter was used for the right heart catheterization. Standard protocol was followed for recording of right heart pressures and sampling of oxygen saturations. Fick cardiac output was calculated. Visualization of the LCA was poor with a 4 Fr catheter wo the sheath and JL-4 were upsized to 5 Fr. Standard Judkins  catheters were used for selective coronary angiography and left ventriculography. There were no immediate procedural complications. The patient was transferred to the post catheterization recovery area for further monitoring.  Procedural Findings:  Hemodynamics  RA 5  RV 45/7  PA 43/15 mean 26  PCWP 14  LV not recorded  AO 149/74 mean 104  Oxygen saturations:  PA 73  AO 99  SVC 76  Cardiac Output (Fick) 5.4  Cardiac Index (Fick) 3.1  Coronary angiography:  Coronary dominance: right  Left mainstem: Widely patent without obstructive disease  Left anterior descending (LAD): The proximal LAD is smooth without irregularity or obstructive disease. The first diagonal is large and widely patent. The LAD just beyond the diagonal has an eccentric 40% stenosis. The remainder of the vessel is widely patent without disease.  Left circumflex (LCx): Large, dominant vessel. The OM, PLA, and PDA branches are all patent without obstructive disease.  Right coronary artery (RCA): medium caliber,  nondominant vessel, supplying a single RV marginal branch. The vessel is smooth without obstructive disease.  Left ventriculography: Deferred (Ao valve was not crossed)  Aoritc root angiography: The aortic valve has extreme bulky calcification and limited mobility. The left cusp has restricted motion, the other cusp is immobile. There is 2-3+ AI.  Final Conclusions:  1. Patent coronary arteries (left dominant) with mild nonobstructive disease of the LAD  2. Severely calcified, restricted aortic valve (likely bicuspid) with at least moderate AI  3. Essentially normal right heart hemodynamics except for mildly elevated PA pressure  Recommendations: Eval by cardiac surgery for consideration of open surgical AVR versus TAVR.  Sherren Mocha  10/09/2013, 9:44 AM    Cardiac TAVR CT   TECHNIQUE:  The patient was scanned on a Philips 256 scanner. A 120 kV  retrospective scan was triggered in the descending thoracic  aorta at  111 HU's. Gantry rotation speed was 270 msecs and collimation was .9  mm. No beta blockade or nitro were given. The 3D data set was  reconstructed in 5% intervals of the R-R cycle. Systolic and  diastolic phases were analyzed on a dedicated work station using  MPR, MIP and VRT modes. The patient received 80 cc of contrast.  FINDINGS:  Aortic Valve: Severely calcified tricuspid aortic valve with  restricted leaflet opening  Aorta: Minimal calcification in the visualized position of the  ascending and descending thoracic aorta. Moderate calcifications in  the aortic arch that is not completely visualized.  Sinotubular Junction: 27 x 29 mm  Ascending Thoracic Aorta: 36 x 36 mm  Descending Thoracic Aorta: 25 x 26 mm  Sinus of Valsalva Measurements:  Non-coronary: 31 mm  Right -coronary: 30 mm  Left -coronary: 33 mm  Coronary Artery Height above Annulus:  Left Main: 13 mm  Right Coronary: 13 mm  Virtual Basal Annulus Measurements:  Maximum/Minimum Diameter: 28 x 24 mm  Perimeter: 94 mm  Area: 526 mm2  Coronary Arteries: Originating in a regular position. Occluded RCA  at the origin. The study quality insufficient to evaluate for  coronary plague.  Optimum Fluoroscopic Angle for Delivery: RAO 0 CAU 0  Annulus to aortic angle distance 71 mm (for transaortic TAVR  delivery).  IMPRESSION:  1. Severely calcified aortic valve with measurements suitable for 26  mm Edward Sapien valve.  2. Sufficient annulus to coronary distance.  3. Optimum Fluoroscopic Angle for Delivery: RAO 0 CAU 0  Ena Dawley  Electronically Signed  By: Ena Dawley  On: 10/20/2013 22:06       Study Result     EXAM:  OVER-READ INTERPRETATION CT CHEST  The following report is an over-read performed by radiologist Dr.  Julian Hy of [facility], [place] on 10/20/2013. This  over-read does not include interpretation of cardiac or coronary  anatomy or pathology. The coronary CTA  interpretation by the  cardiologist is attached.  COMPARISON: None.  FINDINGS:  Mild linear scarring in the right middle lobe and lingula. Minimal  dependent atelectasis in the right lower lobe. No suspicious  pulmonary nodules.  No thoracic lymphadenopathy.  Visualized upper abdomen is unremarkable.  Degenerative changes of the visualized thoracic spine.  IMPRESSION:  No significant extracardiac findings.  Electronically Signed:  By: Julian Hy M.D.  On: 10/20/2013 17:14     STS Risk Calculator  Procedure AVR  Risk of Mortality 1.7%  Morbidity or Mortality 13.5%  Prolonged LOS 4.2%  Short LOS 49.5%  Permanent Stroke 1.1%  Prolonged Vent Support 7.2%  DSW Infection 0.2%  Renal Failure 2.8%  Reoperation 7.1%    Impression:  He has critical aortic stenosis with preserved left ventricular function and no significant coronary disease. He has progressive exertional shortness of breath and fatigue with NYHA class 1-2 symptoms. He has slowly progressive Parkinson's disease since 2008 that causes significant limitation but he is otherwise fairly healthy. His STS operative risk for conventional open AVR is only 1.7% but I think this grossly underestimates the true operative risk in this gentleman with progressive Parkinson's disease. He would be at particularly high-risk for major morbidity after conventional surgery, including the risk of stroke, pneumonia, respiratory failure, aspiration, and delirium. Recovery from conventional surgery would likely require long-term placement in some type of rehab or skilled nursing facility. I think that TAVR is a good alternative for this patient and would allow a quicker return to functional status with a significantly lower morbidity and mortality. I discussed the procedure of TAVR with the patient and his daughter. Alternative approaches such as conventional aortic valve replacement, transcatheter aortic valve replacement, and palliative medical  therapy were compared and contrasted at length. Long-term prognosis with medical therapy was discussed including the risks of the development of heart failure, ventricular dysfunction, syncope, and sudden death. I don't  think he would tolerate an acute exacerbation of congestive heart failure very well.  I answered all of their questions and they are in agreement to proceed with TAVR tomorrow.   Plan:  Transfemoral TAVR tomorrow.    Gaye Pollack, MD 11/17/2013 1:43 PM

## 2013-11-18 ENCOUNTER — Encounter (HOSPITAL_COMMUNITY)
Admission: RE | Disposition: A | Discharge status: Home Health | Payer: Medicare Other | Source: Ambulatory Visit | Attending: Cardiovascular Disease

## 2013-11-18 ENCOUNTER — Inpatient Hospital Stay (HOSPITAL_COMMUNITY): Payer: Medicare Other

## 2013-11-18 ENCOUNTER — Encounter (HOSPITAL_COMMUNITY): Payer: Self-pay | Admitting: *Deleted

## 2013-11-18 ENCOUNTER — Inpatient Hospital Stay (HOSPITAL_COMMUNITY): Payer: Medicare Other | Admitting: Certified Registered"

## 2013-11-18 ENCOUNTER — Encounter (HOSPITAL_COMMUNITY): Payer: Medicare Other | Admitting: Certified Registered"

## 2013-11-18 DIAGNOSIS — D62 Acute posthemorrhagic anemia: Secondary | ICD-10-CM | POA: Diagnosis not present

## 2013-11-18 DIAGNOSIS — Z602 Problems related to living alone: Secondary | ICD-10-CM | POA: Diagnosis not present

## 2013-11-18 DIAGNOSIS — I359 Nonrheumatic aortic valve disorder, unspecified: Secondary | ICD-10-CM | POA: Diagnosis present

## 2013-11-18 DIAGNOSIS — Z7982 Long term (current) use of aspirin: Secondary | ICD-10-CM | POA: Diagnosis not present

## 2013-11-18 DIAGNOSIS — Z7902 Long term (current) use of antithrombotics/antiplatelets: Secondary | ICD-10-CM | POA: Diagnosis not present

## 2013-11-18 DIAGNOSIS — Y9210 Unspecified residential institution as the place of occurrence of the external cause: Secondary | ICD-10-CM | POA: Diagnosis not present

## 2013-11-18 DIAGNOSIS — I5032 Chronic diastolic (congestive) heart failure: Secondary | ICD-10-CM | POA: Diagnosis not present

## 2013-11-18 DIAGNOSIS — G20 Parkinson's disease: Secondary | ICD-10-CM | POA: Diagnosis not present

## 2013-11-18 DIAGNOSIS — Z006 Encounter for examination for normal comparison and control in clinical research program: Secondary | ICD-10-CM | POA: Diagnosis not present

## 2013-11-18 DIAGNOSIS — Z79899 Other long term (current) drug therapy: Secondary | ICD-10-CM | POA: Diagnosis not present

## 2013-11-18 DIAGNOSIS — D6959 Other secondary thrombocytopenia: Secondary | ICD-10-CM | POA: Diagnosis present

## 2013-11-18 DIAGNOSIS — Z954 Presence of other heart-valve replacement: Secondary | ICD-10-CM | POA: Diagnosis not present

## 2013-11-18 DIAGNOSIS — Z952 Presence of prosthetic heart valve: Secondary | ICD-10-CM

## 2013-11-18 DIAGNOSIS — G3183 Dementia with Lewy bodies: Secondary | ICD-10-CM | POA: Diagnosis present

## 2013-11-18 DIAGNOSIS — G20A1 Parkinson's disease without dyskinesia, without mention of fluctuations: Secondary | ICD-10-CM | POA: Diagnosis present

## 2013-11-18 DIAGNOSIS — I517 Cardiomegaly: Secondary | ICD-10-CM | POA: Diagnosis not present

## 2013-11-18 DIAGNOSIS — J988 Other specified respiratory disorders: Secondary | ICD-10-CM | POA: Diagnosis not present

## 2013-11-18 DIAGNOSIS — G2581 Restless legs syndrome: Secondary | ICD-10-CM | POA: Diagnosis present

## 2013-11-18 DIAGNOSIS — I509 Heart failure, unspecified: Secondary | ICD-10-CM | POA: Diagnosis present

## 2013-11-18 DIAGNOSIS — Z885 Allergy status to narcotic agent status: Secondary | ICD-10-CM | POA: Diagnosis not present

## 2013-11-18 DIAGNOSIS — F0280 Dementia in other diseases classified elsewhere without behavioral disturbance: Secondary | ICD-10-CM | POA: Diagnosis present

## 2013-11-18 DIAGNOSIS — Z8673 Personal history of transient ischemic attack (TIA), and cerebral infarction without residual deficits: Secondary | ICD-10-CM | POA: Diagnosis not present

## 2013-11-18 DIAGNOSIS — Y831 Surgical operation with implant of artificial internal device as the cause of abnormal reaction of the patient, or of later complication, without mention of misadventure at the time of the procedure: Secondary | ICD-10-CM | POA: Diagnosis not present

## 2013-11-18 DIAGNOSIS — J9819 Other pulmonary collapse: Secondary | ICD-10-CM | POA: Diagnosis not present

## 2013-11-18 DIAGNOSIS — F411 Generalized anxiety disorder: Secondary | ICD-10-CM | POA: Diagnosis present

## 2013-11-18 DIAGNOSIS — Z87442 Personal history of urinary calculi: Secondary | ICD-10-CM | POA: Diagnosis not present

## 2013-11-18 DIAGNOSIS — R0602 Shortness of breath: Secondary | ICD-10-CM | POA: Diagnosis present

## 2013-11-18 DIAGNOSIS — K219 Gastro-esophageal reflux disease without esophagitis: Secondary | ICD-10-CM | POA: Diagnosis not present

## 2013-11-18 HISTORY — PX: TRANSCATHETER AORTIC VALVE REPLACEMENT, TRANSFEMORAL: SHX6400

## 2013-11-18 HISTORY — PX: INTRAOPERATIVE TRANSESOPHAGEAL ECHOCARDIOGRAM: SHX5062

## 2013-11-18 HISTORY — DX: Presence of prosthetic heart valve: Z95.2

## 2013-11-18 LAB — POCT I-STAT, CHEM 8
BUN: 15 mg/dL (ref 6–23)
BUN: 14 mg/dL (ref 6–23)
BUN: 13 mg/dL (ref 6–23)
BUN: 13 mg/dL (ref 6–23)
CALCIUM ION: 1.22 mmol/L (ref 1.13–1.30)
CALCIUM ION: 1.2 mmol/L (ref 1.13–1.30)
CHLORIDE: 103 meq/L (ref 96–112)
CHLORIDE: 103 meq/L (ref 96–112)
CREATININE: 0.8 mg/dL (ref 0.50–1.35)
Calcium, Ion: 1.21 mmol/L (ref 1.13–1.30)
Calcium, Ion: 1.17 mmol/L (ref 1.13–1.30)
Chloride: 105 mEq/L (ref 96–112)
Chloride: 110 mEq/L (ref 96–112)
Creatinine, Ser: 0.7 mg/dL (ref 0.50–1.35)
Creatinine, Ser: 0.6 mg/dL (ref 0.50–1.35)
Creatinine, Ser: 0.7 mg/dL (ref 0.50–1.35)
GLUCOSE: 135 mg/dL — AB (ref 70–99)
Glucose, Bld: 110 mg/dL — ABNORMAL HIGH (ref 70–99)
Glucose, Bld: 129 mg/dL — ABNORMAL HIGH (ref 70–99)
Glucose, Bld: 125 mg/dL — ABNORMAL HIGH (ref 70–99)
HCT: 37 % — ABNORMAL LOW (ref 39.0–52.0)
HEMATOCRIT: 36 % — AB (ref 39.0–52.0)
HEMATOCRIT: 37 % — AB (ref 39.0–52.0)
HEMATOCRIT: 35 % — AB (ref 39.0–52.0)
HEMOGLOBIN: 12.2 g/dL — AB (ref 13.0–17.0)
Hemoglobin: 12.6 g/dL — ABNORMAL LOW (ref 13.0–17.0)
Hemoglobin: 11.9 g/dL — ABNORMAL LOW (ref 13.0–17.0)
Hemoglobin: 12.6 g/dL — ABNORMAL LOW (ref 13.0–17.0)
POTASSIUM: 4.2 meq/L (ref 3.7–5.3)
POTASSIUM: 3.8 meq/L (ref 3.7–5.3)
POTASSIUM: 3.8 meq/L (ref 3.7–5.3)
Potassium: 3.7 mEq/L (ref 3.7–5.3)
SODIUM: 140 meq/L (ref 137–147)
Sodium: 143 mEq/L (ref 137–147)
Sodium: 141 mEq/L (ref 137–147)
Sodium: 138 mEq/L (ref 137–147)
TCO2: 28 mmol/L (ref 0–100)
TCO2: 27 mmol/L (ref 0–100)
TCO2: 26 mmol/L (ref 0–100)
TCO2: 25 mmol/L (ref 0–100)

## 2013-11-18 LAB — POCT I-STAT 3, ART BLOOD GAS (G3+)
ACID-BASE EXCESS: 1 mmol/L (ref 0.0–2.0)
Acid-Base Excess: 4 mmol/L — ABNORMAL HIGH (ref 0.0–2.0)
BICARBONATE: 27.4 meq/L — AB (ref 20.0–24.0)
Bicarbonate: 27 mEq/L — ABNORMAL HIGH (ref 20.0–24.0)
O2 Saturation: 100 %
O2 Saturation: 98 %
PCO2 ART: 36 mmHg (ref 35.0–45.0)
PH ART: 7.489 — AB (ref 7.350–7.450)
PO2 ART: 486 mmHg — AB (ref 80.0–100.0)
TCO2: 28 mmol/L (ref 0–100)
TCO2: 28 mmol/L (ref 0–100)
pCO2 arterial: 47.5 mmHg — ABNORMAL HIGH (ref 35.0–45.0)
pH, Arterial: 7.361 (ref 7.350–7.450)
pO2, Arterial: 119 mmHg — ABNORMAL HIGH (ref 80.0–100.0)

## 2013-11-18 LAB — CBC
HEMATOCRIT: 36 % — AB (ref 39.0–52.0)
HEMOGLOBIN: 12.1 g/dL — AB (ref 13.0–17.0)
MCH: 29.3 pg (ref 26.0–34.0)
MCHC: 33.6 g/dL (ref 30.0–36.0)
MCV: 87.2 fL (ref 78.0–100.0)
Platelets: 94 10*3/uL — ABNORMAL LOW (ref 150–400)
RBC: 4.13 MIL/uL — ABNORMAL LOW (ref 4.22–5.81)
RDW: 13.4 % (ref 11.5–15.5)
WBC: 7.6 10*3/uL (ref 4.0–10.5)

## 2013-11-18 LAB — PREPARE RBC (CROSSMATCH)

## 2013-11-18 LAB — GLUCOSE, CAPILLARY
Glucose-Capillary: 160 mg/dL — ABNORMAL HIGH (ref 70–99)
Glucose-Capillary: 125 mg/dL — ABNORMAL HIGH (ref 70–99)

## 2013-11-18 LAB — PROTIME-INR
INR: 1.37 (ref 0.00–1.49)
Prothrombin Time: 16.9 seconds — ABNORMAL HIGH (ref 11.6–15.2)

## 2013-11-18 LAB — APTT: aPTT: 35 seconds (ref 24–37)

## 2013-11-18 SURGERY — IMPLANTATION, AORTIC VALVE, TRANSCATHETER, FEMORAL APPROACH
Anesthesia: General | Site: Chest

## 2013-11-18 MED ORDER — CLOPIDOGREL BISULFATE 75 MG PO TABS
75.0000 mg | ORAL_TABLET | Freq: Every day | ORAL | Status: DC
Start: 2013-11-19 — End: 2013-11-18
  Filled 2013-11-18: qty 1

## 2013-11-18 MED ORDER — SODIUM CHLORIDE 0.9 % IJ SOLN: 3.0000 mL | INTRAMUSCULAR | Status: DC | PRN

## 2013-11-18 MED ORDER — LIDOCAINE HCL (CARDIAC) 20 MG/ML IV SOLN
INTRAVENOUS | Status: AC
  Filled 2013-11-18: qty 5

## 2013-11-18 MED ORDER — METOPROLOL TARTRATE 12.5 MG HALF TABLET
ORAL_TABLET | ORAL | Status: AC
  Administered 2013-11-18: 12.5 mg via ORAL
  Filled 2013-11-18: qty 1

## 2013-11-18 MED ORDER — SODIUM CHLORIDE 0.9 % IV SOLN
INTRAVENOUS | Status: DC
  Filled 2013-11-18: qty 1

## 2013-11-18 MED ORDER — CLOPIDOGREL BISULFATE 75 MG PO TABS
75.0000 mg | ORAL_TABLET | Freq: Every day | ORAL | Status: DC
  Administered 2013-11-19 – 2013-11-21 (×3): 75 mg via ORAL
  Filled 2013-11-18 (×4): qty 1

## 2013-11-18 MED ORDER — CARBIDOPA-LEVODOPA 25-100 MG PO TABS
1.5000 | ORAL_TABLET | ORAL | Status: DC
  Administered 2013-11-19 – 2013-11-21 (×7): 1.5 via ORAL
  Filled 2013-11-18 (×12): qty 1.5

## 2013-11-18 MED ORDER — LACTATED RINGERS IV SOLN
INTRAVENOUS | Status: DC
  Administered 2013-11-18: 11:00:00 via INTRAVENOUS

## 2013-11-18 MED ORDER — ALBUMIN HUMAN 5 % IV SOLN: 250.0000 mL | INTRAVENOUS | Status: DC | PRN

## 2013-11-18 MED ORDER — FENTANYL CITRATE 0.05 MG/ML IJ SOLN
INTRAMUSCULAR | Status: AC
  Administered 2013-11-18: 50 ug via INTRAVENOUS
  Filled 2013-11-18: qty 2

## 2013-11-18 MED ORDER — ACETAMINOPHEN 160 MG/5ML PO SOLN
1000.0000 mg | Freq: Four times a day (QID) | ORAL | Status: DC
  Filled 2013-11-18: qty 40

## 2013-11-18 MED ORDER — OXYCODONE HCL 5 MG PO TABS: 5.0000 mg | ORAL_TABLET | ORAL | Status: DC | PRN

## 2013-11-18 MED ORDER — VANCOMYCIN HCL IN DEXTROSE 1-5 GM/200ML-% IV SOLN
1000.0000 mg | Freq: Once | INTRAVENOUS | Status: AC
  Administered 2013-11-18: 1000 mg via INTRAVENOUS
  Filled 2013-11-18: qty 200

## 2013-11-18 MED ORDER — DEXTROSE 5 % IV SOLN
1.5000 g | Freq: Two times a day (BID) | INTRAVENOUS | Status: AC
  Administered 2013-11-18 – 2013-11-20 (×4): 1.5 g via INTRAVENOUS
  Filled 2013-11-18 (×5): qty 1.5

## 2013-11-18 MED ORDER — 0.9 % SODIUM CHLORIDE (POUR BTL) OPTIME
TOPICAL | Status: DC | PRN
  Administered 2013-11-18: 6000 mL

## 2013-11-18 MED ORDER — ROCURONIUM BROMIDE 100 MG/10ML IV SOLN
INTRAVENOUS | Status: DC | PRN
  Administered 2013-11-18: 40 mg via INTRAVENOUS
  Administered 2013-11-18: 10 mg via INTRAVENOUS
  Administered 2013-11-18: 50 mg via INTRAVENOUS

## 2013-11-18 MED ORDER — PROPOFOL 10 MG/ML IV BOLUS
INTRAVENOUS | Status: DC | PRN
  Administered 2013-11-18: 70 mg via INTRAVENOUS

## 2013-11-18 MED ORDER — GLYCOPYRROLATE 0.2 MG/ML IJ SOLN
INTRAMUSCULAR | Status: DC | PRN
  Administered 2013-11-18: 0.6 mg via INTRAVENOUS

## 2013-11-18 MED ORDER — METOPROLOL TARTRATE 1 MG/ML IV SOLN: 2.5000 mg | INTRAVENOUS | Status: DC | PRN

## 2013-11-18 MED ORDER — NEOSTIGMINE METHYLSULFATE 10 MG/10ML IV SOLN
INTRAVENOUS | Status: AC
  Filled 2013-11-18: qty 1

## 2013-11-18 MED ORDER — ACETAMINOPHEN 650 MG RE SUPP: 650.0000 mg | Freq: Once | RECTAL | Status: DC

## 2013-11-18 MED ORDER — PHENYLEPHRINE HCL 10 MG/ML IJ SOLN
0.0000 ug/min | INTRAMUSCULAR | Status: DC
  Filled 2013-11-18: qty 2

## 2013-11-18 MED ORDER — IODIXANOL 320 MG/ML IV SOLN
INTRAVENOUS | Status: DC | PRN
  Administered 2013-11-18: 116 mL via INTRA_ARTERIAL

## 2013-11-18 MED ORDER — VANCOMYCIN HCL IN DEXTROSE 1-5 GM/200ML-% IV SOLN
1000.0000 mg | Freq: Once | INTRAVENOUS | Status: DC
  Filled 2013-11-18: qty 200

## 2013-11-18 MED ORDER — ROCURONIUM BROMIDE 50 MG/5ML IV SOLN
INTRAVENOUS | Status: AC
  Filled 2013-11-18: qty 1

## 2013-11-18 MED ORDER — ONDANSETRON HCL 4 MG/2ML IJ SOLN: 4.0000 mg | Freq: Four times a day (QID) | INTRAMUSCULAR | Status: DC | PRN

## 2013-11-18 MED ORDER — INSULIN ASPART 100 UNIT/ML ~~LOC~~ SOLN
0.0000 [IU] | SUBCUTANEOUS | Status: DC
  Administered 2013-11-19 (×2): 2 [IU] via SUBCUTANEOUS

## 2013-11-18 MED ORDER — INSULIN REGULAR BOLUS VIA INFUSION
0.0000 [IU] | Freq: Three times a day (TID) | INTRAVENOUS | Status: DC
  Filled 2013-11-18: qty 10

## 2013-11-18 MED ORDER — LACTATED RINGERS IV SOLN: 500.0000 mL | Freq: Once | INTRAVENOUS | Status: AC | PRN

## 2013-11-18 MED ORDER — PANTOPRAZOLE SODIUM 40 MG PO TBEC: 40.0000 mg | DELAYED_RELEASE_TABLET | Freq: Every day | ORAL | Status: DC

## 2013-11-18 MED ORDER — POTASSIUM CHLORIDE 10 MEQ/50ML IV SOLN: 10.0000 meq | INTRAVENOUS | Status: AC

## 2013-11-18 MED ORDER — LACTATED RINGERS IV SOLN: 500.0000 mL | Freq: Once | INTRAVENOUS | Status: DC | PRN

## 2013-11-18 MED ORDER — HEPARIN SODIUM (PORCINE) 1000 UNIT/ML IJ SOLN
INTRAMUSCULAR | Status: DC | PRN
  Administered 2013-11-18: 7000 [IU] via INTRAVENOUS

## 2013-11-18 MED ORDER — FENTANYL CITRATE 0.05 MG/ML IJ SOLN
INTRAMUSCULAR | Status: AC
  Filled 2013-11-18: qty 5

## 2013-11-18 MED ORDER — SODIUM CHLORIDE 0.9 % IV SOLN: 1.0000 mL/kg/h | INTRAVENOUS | Status: DC

## 2013-11-18 MED ORDER — ACETAMINOPHEN 160 MG/5ML PO SOLN: 1000.0000 mg | Freq: Four times a day (QID) | ORAL | Status: DC

## 2013-11-18 MED ORDER — MAGNESIUM SULFATE 4000MG/100ML IJ SOLN: 4.0000 g | Freq: Once | INTRAMUSCULAR | Status: DC

## 2013-11-18 MED ORDER — SODIUM CHLORIDE 0.9 % IJ SOLN: 3.0000 mL | Freq: Two times a day (BID) | INTRAMUSCULAR | Status: DC

## 2013-11-18 MED ORDER — NITROGLYCERIN IN D5W 200-5 MCG/ML-% IV SOLN: 0.0000 ug/min | INTRAVENOUS | Status: DC

## 2013-11-18 MED ORDER — ALBUMIN HUMAN 5 % IV SOLN
250.0000 mL | INTRAVENOUS | Status: DC | PRN
  Filled 2013-11-18: qty 250

## 2013-11-18 MED ORDER — POTASSIUM CHLORIDE 10 MEQ/50ML IV SOLN: 10.0000 meq | INTRAVENOUS | Status: DC

## 2013-11-18 MED ORDER — PHENYLEPHRINE HCL 10 MG/ML IJ SOLN: 0.0000 ug/min | INTRAVENOUS | Status: DC

## 2013-11-18 MED ORDER — METOPROLOL TARTRATE 25 MG/10 ML ORAL SUSPENSION
12.5000 mg | Freq: Two times a day (BID) | ORAL | Status: DC
  Filled 2013-11-18: qty 5

## 2013-11-18 MED ORDER — METOPROLOL TARTRATE 12.5 MG HALF TABLET
12.5000 mg | ORAL_TABLET | Freq: Two times a day (BID) | ORAL | Status: DC
  Administered 2013-11-18 – 2013-11-21 (×5): 12.5 mg via ORAL
  Filled 2013-11-18 (×7): qty 1

## 2013-11-18 MED ORDER — FENTANYL CITRATE 0.05 MG/ML IJ SOLN
50.0000 ug | INTRAMUSCULAR | Status: DC | PRN
  Administered 2013-11-18: 50 ug via INTRAVENOUS

## 2013-11-18 MED ORDER — CHLORHEXIDINE GLUCONATE 4 % EX LIQD
60.0000 mL | Freq: Once | CUTANEOUS | Status: DC
  Filled 2013-11-18: qty 60

## 2013-11-18 MED ORDER — CLONAZEPAM 0.5 MG PO TABS
1.5000 mg | ORAL_TABLET | Freq: Every day | ORAL | Status: DC
  Administered 2013-11-18 – 2013-11-20 (×3): 1.5 mg via ORAL
  Filled 2013-11-18 (×7): qty 1

## 2013-11-18 MED ORDER — MIDAZOLAM HCL 2 MG/2ML IJ SOLN
INTRAMUSCULAR | Status: AC
  Filled 2013-11-18: qty 2

## 2013-11-18 MED ORDER — VERAPAMIL HCL 2.5 MG/ML IV SOLN
5.0000 mg | Freq: Once | INTRAVENOUS | Status: DC
  Filled 2013-11-18: qty 2

## 2013-11-18 MED ORDER — SERTRALINE HCL 50 MG PO TABS
50.0000 mg | ORAL_TABLET | Freq: Every day | ORAL | Status: DC
  Administered 2013-11-20 – 2013-11-21 (×2): 50 mg via ORAL
  Filled 2013-11-18 (×4): qty 1

## 2013-11-18 MED ORDER — FAMOTIDINE IN NACL 20-0.9 MG/50ML-% IV SOLN: 20.0000 mg | Freq: Two times a day (BID) | INTRAVENOUS | Status: DC

## 2013-11-18 MED ORDER — MORPHINE SULFATE 2 MG/ML IJ SOLN
2.0000 mg | INTRAMUSCULAR | Status: DC | PRN
  Administered 2013-11-18 – 2013-11-19 (×4): 2 mg via INTRAVENOUS
  Filled 2013-11-18 (×4): qty 1

## 2013-11-18 MED ORDER — INSULIN ASPART 100 UNIT/ML ~~LOC~~ SOLN
0.0000 [IU] | SUBCUTANEOUS | Status: DC
  Administered 2013-11-18 (×2): 2 [IU] via SUBCUTANEOUS

## 2013-11-18 MED ORDER — MIDAZOLAM HCL 2 MG/2ML IJ SOLN
INTRAMUSCULAR | Status: AC
Start: 2013-11-18 — End: 2013-11-18
  Administered 2013-11-18: 1 mg via INTRAVENOUS
  Filled 2013-11-18: qty 2

## 2013-11-18 MED ORDER — METOPROLOL TARTRATE 12.5 MG HALF TABLET
12.5000 mg | ORAL_TABLET | Freq: Two times a day (BID) | ORAL | Status: DC
  Filled 2013-11-18: qty 1

## 2013-11-18 MED ORDER — MIDAZOLAM HCL 2 MG/2ML IJ SOLN: 2.0000 mg | INTRAMUSCULAR | Status: DC | PRN

## 2013-11-18 MED ORDER — METOPROLOL TARTRATE 25 MG/10 ML ORAL SUSPENSION
12.5000 mg | Freq: Two times a day (BID) | ORAL | Status: DC
  Administered 2013-11-19: 12.5 mg
  Filled 2013-11-18 (×7): qty 5

## 2013-11-18 MED ORDER — SODIUM CHLORIDE 0.9 % IV SOLN: 250.0000 mL | INTRAVENOUS | Status: DC | PRN

## 2013-11-18 MED ORDER — ACETAMINOPHEN 160 MG/5ML PO SOLN: 650.0000 mg | Freq: Once | ORAL | Status: DC

## 2013-11-18 MED ORDER — FENTANYL CITRATE 0.05 MG/ML IJ SOLN
INTRAMUSCULAR | Status: DC | PRN
  Administered 2013-11-18: 250 ug via INTRAVENOUS

## 2013-11-18 MED ORDER — SODIUM CHLORIDE 0.9 % IR SOLN
Status: DC | PRN
  Administered 2013-11-18 (×3)

## 2013-11-18 MED ORDER — MORPHINE SULFATE 2 MG/ML IJ SOLN: 1.0000 mg | INTRAMUSCULAR | Status: DC | PRN

## 2013-11-18 MED ORDER — TRAMADOL HCL 50 MG PO TABS: 50.0000 mg | ORAL_TABLET | Freq: Every day | ORAL | Status: DC | PRN

## 2013-11-18 MED ORDER — ACETAMINOPHEN 650 MG RE SUPP
650.0000 mg | Freq: Once | RECTAL | Status: DC
Start: 2013-11-18 — End: 2013-11-21

## 2013-11-18 MED ORDER — PROPOFOL 10 MG/ML IV BOLUS
INTRAVENOUS | Status: AC
  Filled 2013-11-18: qty 20

## 2013-11-18 MED ORDER — DEXMEDETOMIDINE HCL IN NACL 200 MCG/50ML IV SOLN: 0.1000 ug/kg/h | INTRAVENOUS | Status: DC

## 2013-11-18 MED ORDER — ARTIFICIAL TEARS OP OINT
TOPICAL_OINTMENT | OPHTHALMIC | Status: AC
  Filled 2013-11-18: qty 3.5

## 2013-11-18 MED ORDER — LIDOCAINE HCL (CARDIAC) 20 MG/ML IV SOLN
INTRAVENOUS | Status: DC | PRN
  Administered 2013-11-18: 50 mg via INTRAVENOUS

## 2013-11-18 MED ORDER — ONDANSETRON HCL 4 MG/2ML IJ SOLN
INTRAMUSCULAR | Status: AC
  Filled 2013-11-18: qty 2

## 2013-11-18 MED ORDER — DEXTROSE 5 % IV SOLN: 1.5000 g | Freq: Two times a day (BID) | INTRAVENOUS | Status: DC

## 2013-11-18 MED ORDER — VITAMIN D3 25 MCG (1000 UNIT) PO TABS
1000.0000 [IU] | ORAL_TABLET | Freq: Every day | ORAL | Status: DC
  Administered 2013-11-19 – 2013-11-20 (×2): 1000 [IU] via ORAL
  Filled 2013-11-18 (×4): qty 1

## 2013-11-18 MED ORDER — PANTOPRAZOLE SODIUM 40 MG PO TBEC
40.0000 mg | DELAYED_RELEASE_TABLET | Freq: Every day | ORAL | Status: DC
  Administered 2013-11-20 – 2013-11-21 (×2): 40 mg via ORAL
  Filled 2013-11-18 (×2): qty 1

## 2013-11-18 MED ORDER — ASPIRIN EC 325 MG PO TBEC: 325.0000 mg | DELAYED_RELEASE_TABLET | Freq: Every day | ORAL | Status: DC

## 2013-11-18 MED ORDER — SODIUM CHLORIDE 0.9 % IV SOLN: INTRAVENOUS | Status: DC

## 2013-11-18 MED ORDER — ONDANSETRON HCL 4 MG/2ML IJ SOLN
INTRAMUSCULAR | Status: DC | PRN
  Administered 2013-11-18: 4 mg via INTRAVENOUS

## 2013-11-18 MED ORDER — BIOTENE DRY MOUTH MT LIQD
15.0000 mL | Freq: Two times a day (BID) | OROMUCOSAL | Status: DC
  Administered 2013-11-18 – 2013-11-19 (×2): 15 mL via OROMUCOSAL

## 2013-11-18 MED ORDER — ACETAMINOPHEN 500 MG PO TABS: 1000.0000 mg | ORAL_TABLET | Freq: Four times a day (QID) | ORAL | Status: DC

## 2013-11-18 MED ORDER — ASPIRIN 81 MG PO CHEW: 324.0000 mg | CHEWABLE_TABLET | Freq: Every day | ORAL | Status: DC

## 2013-11-18 MED ORDER — MIDAZOLAM HCL 2 MG/2ML IJ SOLN
1.0000 mg | INTRAMUSCULAR | Status: DC | PRN
  Administered 2013-11-18: 1 mg via INTRAVENOUS

## 2013-11-18 MED ORDER — ACETAMINOPHEN 500 MG PO TABS
1000.0000 mg | ORAL_TABLET | Freq: Four times a day (QID) | ORAL | Status: DC
Start: 2013-11-19 — End: 2013-11-21
  Administered 2013-11-19 – 2013-11-21 (×7): 1000 mg via ORAL
  Filled 2013-11-18 (×17): qty 2

## 2013-11-18 MED ORDER — ASPIRIN EC 325 MG PO TBEC
325.0000 mg | DELAYED_RELEASE_TABLET | Freq: Every day | ORAL | Status: DC
  Filled 2013-11-18: qty 1

## 2013-11-18 MED ORDER — SODIUM CHLORIDE 0.9 % IV SOLN: 1.0000 mL/kg/h | INTRAVENOUS | Status: AC

## 2013-11-18 MED ORDER — PROTAMINE SULFATE 10 MG/ML IV SOLN
INTRAVENOUS | Status: DC | PRN
  Administered 2013-11-18: 20 mg via INTRAVENOUS
  Administered 2013-11-18: 10 mg via INTRAVENOUS
  Administered 2013-11-18 (×2): 20 mg via INTRAVENOUS

## 2013-11-18 MED ORDER — NEOSTIGMINE METHYLSULFATE 10 MG/10ML IV SOLN
INTRAVENOUS | Status: DC | PRN
  Administered 2013-11-18: 4 mg via INTRAVENOUS

## 2013-11-18 MED ORDER — ACETAMINOPHEN 160 MG/5ML PO SOLN
650.0000 mg | Freq: Once | ORAL | Status: DC
Start: 2013-11-18 — End: 2013-11-21

## 2013-11-18 MED ORDER — ARTIFICIAL TEARS OP OINT
TOPICAL_OINTMENT | OPHTHALMIC | Status: DC | PRN
  Administered 2013-11-18: 1 via OPHTHALMIC

## 2013-11-18 MED ORDER — GLYCOPYRROLATE 0.2 MG/ML IJ SOLN
INTRAMUSCULAR | Status: AC
  Filled 2013-11-18: qty 3

## 2013-11-18 MED ORDER — METOPROLOL TARTRATE 12.5 MG HALF TABLET
12.5000 mg | ORAL_TABLET | Freq: Once | ORAL | Status: AC
  Administered 2013-11-18: 12.5 mg via ORAL

## 2013-11-18 MED ORDER — VERAPAMIL HCL 2.5 MG/ML IV SOLN
INTRAVENOUS | Status: DC | PRN
  Administered 2013-11-18: 3 mg via INTRAVENOUS

## 2013-11-18 MED ORDER — PROTAMINE SULFATE 10 MG/ML IV SOLN
INTRAVENOUS | Status: AC
  Filled 2013-11-18: qty 25

## 2013-11-18 SURGICAL SUPPLY — 121 items
ADAPTER CARDIOPLEGIA (MISCELLANEOUS) IMPLANT
ADH SKN CLS APL DERMABOND .7 (GAUZE/BANDAGES/DRESSINGS) ×2
ANTEGRADE CPLG (MISCELLANEOUS) IMPLANT
ARTERIAL PRESSURE LINE (MISCELLANEOUS) ×4 IMPLANT
ATTRACTOMAT 16X20 MAGNETIC DRP (DRAPES) IMPLANT
BAG BANDED W/RUBBER/TAPE 36X54 (MISCELLANEOUS) ×4 IMPLANT
BAG DECANTER FOR FLEXI CONT (MISCELLANEOUS) IMPLANT
BAG EQP BAND 135X91 W/RBR TAPE (MISCELLANEOUS) ×2
BAG SNAP BAND KOVER 36X36 (MISCELLANEOUS) ×4 IMPLANT
BLADE 10 SAFETY STRL DISP (BLADE) ×4 IMPLANT
BLADE STERNUM SYSTEM 6 (BLADE) ×4 IMPLANT
BLADE SURG ROTATE 9660 (MISCELLANEOUS) IMPLANT
CABLE PACING FASLOC BIEGE (MISCELLANEOUS) ×4 IMPLANT
CABLE PACING FASLOC BLUE (MISCELLANEOUS) ×4 IMPLANT
CANISTER SUCTION 2500CC (MISCELLANEOUS) IMPLANT
CANNULA FEM VENOUS REMOTE 22FR (CANNULA) IMPLANT
CANNULA FEMORAL ART 14 SM (MISCELLANEOUS) IMPLANT
CANNULA GUNDRY RCSP 15FR (MISCELLANEOUS) IMPLANT
CANNULA OPTISITE PERFUSION 16F (CANNULA) IMPLANT
CANNULA OPTISITE PERFUSION 18F (CANNULA) IMPLANT
CANNULA SOFTFLOW AORTIC 7M21FR (CANNULA) IMPLANT
CANNULA VENOUS LOW PROF 34X46 (CANNULA) IMPLANT
CATH DIAG EXPO 6F VENT PIG 145 (CATHETERS) ×4 IMPLANT
CATH EXPO 5FR AL1 (CATHETERS) ×2 IMPLANT
CATH HEART VENT LEFT (CATHETERS) IMPLANT
CATH S G BIP PACING (SET/KITS/TRAYS/PACK) ×6 IMPLANT
CATH SOFT-VU ST 4F 90CM (CATHETERS) ×2 IMPLANT
CLIP TI MEDIUM 24 (CLIP) ×4 IMPLANT
CLIP TI WIDE RED SMALL 24 (CLIP) ×4 IMPLANT
CONN ST 1/4X3/8  BEN (MISCELLANEOUS)
CONN ST 1/4X3/8 BEN (MISCELLANEOUS) IMPLANT
CONNECTOR 1/2X3/8X1/2 3 WAY (MISCELLANEOUS)
CONNECTOR 1/2X3/8X1/2 3WAY (MISCELLANEOUS) IMPLANT
COVER DOME SNAP 22 D (MISCELLANEOUS) ×4 IMPLANT
COVER MAYO STAND STRL (DRAPES) ×4 IMPLANT
COVER PROBE W GEL 5X96 (DRAPES) IMPLANT
COVER SURGICAL LIGHT HANDLE (MISCELLANEOUS) ×4 IMPLANT
COVER TABLE BACK 60X90 (DRAPES) ×4 IMPLANT
CRADLE DONUT ADULT HEAD (MISCELLANEOUS) ×4 IMPLANT
DERMABOND ADVANCED (GAUZE/BANDAGES/DRESSINGS) ×2
DERMABOND ADVANCED .7 DNX12 (GAUZE/BANDAGES/DRESSINGS) ×2 IMPLANT
DEVICE RAD COMP TR BAND LRG (VASCULAR PRODUCTS) ×2 IMPLANT
DRAIN CHANNEL 28F RND 3/8 FF (WOUND CARE) IMPLANT
DRAIN CHANNEL 32F RND 10.7 FF (WOUND CARE) IMPLANT
DRAPE INCISE IOBAN 66X45 STRL (DRAPES) IMPLANT
DRAPE SLUSH/WARMER DISC (DRAPES) ×4 IMPLANT
DRAPE TABLE COVER HEAVY DUTY (DRAPES) ×4 IMPLANT
DRSG TEGADERM 4X4.75 (GAUZE/BANDAGES/DRESSINGS) ×8 IMPLANT
ELECT REM PT RETURN 9FT ADLT (ELECTROSURGICAL) ×8
ELECTRODE REM PT RTRN 9FT ADLT (ELECTROSURGICAL) ×4 IMPLANT
FELT TEFLON 6X6 (MISCELLANEOUS) ×4 IMPLANT
FEMORAL VENOUS CANN RAP (CANNULA) IMPLANT
GLIDESHEATH SLEND SS 6F .021 (SHEATH) ×2 IMPLANT
GLOVE ECLIPSE 7.5 STRL STRAW (GLOVE) ×4 IMPLANT
GLOVE ECLIPSE 8.0 STRL XLNG CF (GLOVE) ×8 IMPLANT
GLOVE EUDERMIC 7 POWDERFREE (GLOVE) ×4 IMPLANT
GLOVE ORTHO TXT STRL SZ7.5 (GLOVE) ×4 IMPLANT
GOWN STRL REUS W/ TWL LRG LVL3 (GOWN DISPOSABLE) ×6 IMPLANT
GOWN STRL REUS W/ TWL XL LVL3 (GOWN DISPOSABLE) ×12 IMPLANT
GOWN STRL REUS W/TWL LRG LVL3 (GOWN DISPOSABLE) ×12
GOWN STRL REUS W/TWL XL LVL3 (GOWN DISPOSABLE) ×24
GUIDEWIRE SAF TJ AMPL .035X180 (WIRE) ×4 IMPLANT
GUIDEWIRE SAFE TJ AMPLATZ EXST (WIRE) ×2 IMPLANT
GUIDEWIRE STRAIGHT .035 260CM (WIRE) ×2 IMPLANT
HEMOSTAT POWDER SURGIFOAM 1G (HEMOSTASIS) IMPLANT
INSERT FOGARTY 61MM (MISCELLANEOUS) IMPLANT
INSERT FOGARTY XLG (MISCELLANEOUS) IMPLANT
KIT BASIN OR (CUSTOM PROCEDURE TRAY) ×4 IMPLANT
KIT DILATOR VASC 18G NDL (KITS) IMPLANT
KIT HEART LEFT (KITS) ×2 IMPLANT
KIT ROOM TURNOVER OR (KITS) ×4 IMPLANT
KIT SUCTION CATH 14FR (SUCTIONS) ×8 IMPLANT
LEAD PACING MYOCARDI (MISCELLANEOUS) IMPLANT
NDL PERC 18GX7CM (NEEDLE) ×2 IMPLANT
NEEDLE PERC 18GX7CM (NEEDLE) ×4 IMPLANT
NS IRRIG 1000ML POUR BTL (IV SOLUTION) ×12 IMPLANT
PACK AORTA (CUSTOM PROCEDURE TRAY) ×4 IMPLANT
PAD ARMBOARD 7.5X6 YLW CONV (MISCELLANEOUS) ×8 IMPLANT
PAD ELECT DEFIB RADIOL ZOLL (MISCELLANEOUS) ×4 IMPLANT
PATCH TACHOSII LRG 9.5X4.8 (VASCULAR PRODUCTS) IMPLANT
SET CANNULATION TOURNIQUET (MISCELLANEOUS) IMPLANT
SHEATH PINNACLE 6F 10CM (SHEATH) ×4 IMPLANT
SPONGE GAUZE 4X4 12PLY (GAUZE/BANDAGES/DRESSINGS) ×4 IMPLANT
SPONGE GAUZE 4X4 12PLY STER LF (GAUZE/BANDAGES/DRESSINGS) ×2 IMPLANT
SPONGE LAP 4X18 X RAY DECT (DISPOSABLE) ×4 IMPLANT
STOPCOCK MORSE 400PSI 3WAY (MISCELLANEOUS) ×6 IMPLANT
SUT ETHIBOND 2 0 SH (SUTURE) ×4
SUT ETHIBOND 2 0 SH 36X2 (SUTURE) ×2 IMPLANT
SUT ETHIBOND X763 2 0 SH 1 (SUTURE) ×8 IMPLANT
SUT MNCRL AB 3-0 PS2 18 (SUTURE) ×4 IMPLANT
SUT PDS AB 1 CTX 36 (SUTURE) IMPLANT
SUT PROLENE 2 0 MH 48 (SUTURE) IMPLANT
SUT PROLENE 3 0 SH1 36 (SUTURE) IMPLANT
SUT PROLENE 4 0 RB 1 (SUTURE)
SUT PROLENE 4-0 RB1 .5 CRCL 36 (SUTURE) IMPLANT
SUT PROLENE 5 0 C 1 36 (SUTURE) ×12 IMPLANT
SUT PROLENE 6 0 C 1 30 (SUTURE) ×12 IMPLANT
SUT SILK  1 MH (SUTURE) ×2
SUT SILK 1 MH (SUTURE) ×2 IMPLANT
SUT SILK 2 0 SH CR/8 (SUTURE) ×4 IMPLANT
SUT TEM PAC WIRE 2 0 SH (SUTURE) IMPLANT
SUT VIC AB 2-0 CTX 36 (SUTURE) IMPLANT
SUT VIC AB 3-0 SH 8-18 (SUTURE) ×8 IMPLANT
SYR 30ML LL (SYRINGE) ×8 IMPLANT
SYR 50ML LL SCALE MARK (SYRINGE) ×4 IMPLANT
SYR MEDRAD MARK V 150ML (SYRINGE) ×2 IMPLANT
SYSTEM SAHARA CHEST DRAIN ATS (WOUND CARE) ×4 IMPLANT
TAPE CLOTH SURG 4X10 WHT LF (GAUZE/BANDAGES/DRESSINGS) ×2 IMPLANT
TOWEL OR 17X24 6PK STRL BLUE (TOWEL DISPOSABLE) ×12 IMPLANT
TOWEL OR 17X26 10 PK STRL BLUE (TOWEL DISPOSABLE) ×8 IMPLANT
TRANSDUCER W/STOPCOCK (MISCELLANEOUS) ×4 IMPLANT
TRAY FOLEY IC TEMP SENS 14FR (CATHETERS) ×4 IMPLANT
TUBE SUCT INTRACARD DLP 20F (MISCELLANEOUS) IMPLANT
TUBING CIL FLEX 10 FLL-RA (TUBING) ×2 IMPLANT
TUBING HIGH PRESSURE 120CM (CONNECTOR) ×4 IMPLANT
UNDERPAD 30X30 INCONTINENT (UNDERPADS AND DIAPERS) ×4 IMPLANT
VALVE HRT TRANSCATH NOVAFL 26M (Valve) ×2 IMPLANT
VENT LEFT HEART 12002 (CATHETERS)
WATER STERILE IRR 1000ML POUR (IV SOLUTION) ×8 IMPLANT
WIRE .035 3MM-J 145CM (WIRE) ×2 IMPLANT
WIRE AMPLATZ SS-J .035X180CM (WIRE) ×2 IMPLANT

## 2013-11-18 NOTE — Op Note (Signed)
CARDIOTHORACIC SURGERY OPERATIVE NOTE  Date of Procedure:  11/18/2013  Preoperative Diagnosis: Severe Aortic Stenosis   Postoperative Diagnosis: Same   Procedure:    Transcatheter Aortic Valve Replacement - Open Right Transfemoral Approach  Edwards Sapien XT (size 26 mm, model # 9300TFX, serial # [id number])   Co-Surgeons:  Valentina Gu. Roxy Manns, MD and Sherren Mocha, MD  Assistants:   Gaye Pollack, MD and Lauree Chandler, MD  Anesthesiologist:  Midge Minium, MD  Echocardiographer:  Ena Dawley, MD  Pre-operative Echo Findings:  Severe aortic stenosis  Moderate aortic insufficiency  Normal left ventricular systolic function  Mild mitral regurgitation  Post-operative Echo Findings:  Mild-moderate (2+) paravalvular leak  Normal left ventricular systolic function      DETAILS OF THE OPERATIVE PROCEDURE  The majority of the procedure is documented separately in a procedure note by Dr. Burt Knack.   TRANSFEMORAL ACCESS:   A small incision is made in the right groin immediately over the common femoral artery. The subcutaneous tissues are divided with electrocautery and the anterior surface of the common femoral artery is identified. Sharp dissection is utilized to free up the artery proximally and distally and the vessel is encircled with a vessel loop.  A pair of CV-4 Gore-tex sutures are place as diamond-shaped purse-strings on the anterior surface of the femoral artery.  The patient is heparinized systemically and ACT verified > 250 seconds.  The common femoral artery is punctured using an 18 gauge needle and a soft J-tipped guidewire is passed into the common iliac artery under fluoroscopic guidance.  A 6 Fr straight diagnostic catheter is placed over the guidewire and the guidewire is removed.  An Amplatz super stiff guidewire is passed through the sheath into the descending thoracic aorta and the introducing diagnostic catheter is removed.  Serial dilators are  passed over the guidewire under continuous fluoroscopic guidance, making certain that each dilator passes easily all of the way into the distal abdominal aorta.  A 20 Fr Edwards Novoflex Plus introducer sheath is passed over the guidewire into the abdominal aorta.  The introducing dilator is removed, the sheath is flushed with heparinized saline, and the sheath is secured to the skin.    FEMORAL SHEATH REMOVAL AND ARTERIAL CLOSURE:  After the completion of successful valve deployment as documented separately by Dr. Burt Knack, the femoral artery sheath is removed and the arteriotomy is closed using the previously placed Gore-tex purse-string sutures. Once the repair has been completed protamine was administered to reverse the anticoagulation. A digitally-subtracted arteriogram is obtained from just above the aortic bifurcation to below the arteriotomy to confirm the integrity of the vascular repair.  The incision is irrigated with saline solution and subsequently closed in multiple layers using absorbable suture.  The skin incision is closed using a subcuticular skin closure.     Valentina Gu. Roxy Manns MD 11/18/2013 2:21 PM

## 2013-11-18 NOTE — Progress Notes (Signed)
Patient ID: Bryce Garbe., male   DOB: 18-Mar-1941, 73 y.o.   MRN: [id number]  SICU Evening Rounds:  Hemodynamically stable  Slow speech as before but non-focal neuro exam.  Right neck central line is oozing so will remove it.  No murmur on exam. Right groin incision ok  CBC    Component Value Date/Time   WBC 7.6 11/18/2013 1500   RBC 4.13* 11/18/2013 1500   HGB 12.6* 11/18/2013 1508   HCT 37.0* 11/18/2013 1508   PLT 94* 11/18/2013 1500   MCV 87.2 11/18/2013 1500   MCH 29.3 11/18/2013 1500   MCHC 33.6 11/18/2013 1500   RDW 13.4 11/18/2013 1500   LYMPHSABS 0.5* 05/28/2011 0244   MONOABS 0.4 05/28/2011 0244   EOSABS 0.1 05/28/2011 0244   BASOSABS 0.0 05/28/2011 0244    BMET    Component Value Date/Time   NA 138 11/18/2013 1508   K 3.7 11/18/2013 1508   CL 110 11/18/2013 1508   CO2 23 11/17/2013 1005   GLUCOSE 135* 11/18/2013 1508   BUN 13 11/18/2013 1508   CREATININE 0.80 11/18/2013 1508   CALCIUM 9.0 11/17/2013 1005   GFRNONAA 82* 11/17/2013 1005   GFRAA >90 11/17/2013 1005    A/P: stable postop course

## 2013-11-18 NOTE — H&P (Signed)
[address]       [place] [address]             [phone]          CARDIOTHORACIC SURGERY HISTORY AND PHYSICAL EXAM  PCP is Horton Finer, MD Referring Provider is Candee Furbish, MD   Chief Complaint:  Exertional shortness of breath  HPI:  Patient is a 73 year old recently widowed white male from [place] with known history of aortic stenosis and Parkinson's disease referred to discuss treatment options for the management of severe symptomatic aortic stenosis. The patient has reportedly had a heart murmur for more than 20 years, and in the past she has been told that he had a bicuspid aortic valve. The patient was recently seen by his primary care physician because of an inguinal hernia. He was noted to have a more prominent murmur on physical exam and followup echocardiogram was obtained on may first 2015. This revealed the presence of critical aortic stenosis with peak velocity across the aortic valve greater than 6 m/s corresponding to peak and mean transvalvular gradients estimated to be 157 and 93 mm Hg, respectively.  Left ventricular systolic function remained normal with ejection fraction estimated 65%. The patient was referred for cardiology consultation and seen initially by Dr. Marlou Porch and subsequently by Dr. Burt Knack. The patient has since then undergone left and right heart catheterization which revealed the presence of mild, nonobstructive coronary artery disease. A cardiac gated CT angiogram of the heart was performed to further evaluate the functional anatomy of the patient's aortic valve, and the patient was referred for surgical consultation.  The patient currently lives alone after his wife passed away recently.  He has 4 adult children who live locally and he is accompanied by 2 of his daughters for this consultation, one of whom lives in a house next door to the patient. 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 Over the past 10 years the patient has suffered significant progressive physical decline related to Parkinson's disease which was originally diagnosed in 2008. He has been followed by Dr. Erie Noe at [facility]. His speech and physical mobility is now quite limited. He states that he still care for himself at home, but his family notes that they are present every day and help him with much of his daily activities. He stopped driving an automobile at his familys request more than 3 years ago.  He has experienced some gradual progression of memory loss, confusion, and difficulty with sleeping. His speech is quite slow. He reports mild exertional fatigue and shortness of breath. He states that this does not limit his daily physical activities, but he has noticed a progressive decline over the last few years. He also reports some mild occasional tightness across his chest with exertion. He denies any history of resting shortness of breath or chest pain. He denies any history of PND, orthopnea, or lower extremity edema. He has not had dizzy spells, palpitations, nor syncope.  During the course of the patient's preoperative work up they have been evaluated comprehensively by a multidisciplinary team of specialists coordinated through the [facility] in the [facility].  They have been demonstrated to suffer from symptomatic severe aortic stenosis  as noted above. The patient has been counseled extensively as to the relative risks and benefits of all options for the treatment of severe aortic stenosis including long term medical therapy, conventional surgery for aortic valve replacement, and transcatheter aortic valve replacement.  The patient has been independently evaluated by two  cardiac surgeons including myself and Dr. Cyndia Bent, and both of [place] feel that the patient would be a poor candidate for conventional surgery (predicted risk of mortality >15% and/or predicted risk of permanent morbidity >50%) because of comorbidities including severe Parkinson's Disease.   Based upon review of all of the patient's preoperative diagnostic tests they are felt to be candidate for transcatheter aortic valve replacement using the transfemoral approach as an alternative to high risk conventional surgery.      Past Medical History  Diagnosis Date  . Kidney stones   . Parkinson disease   . GERD (gastroesophageal reflux disease)   . Aortic stenosis   . Anxiety   . Headache(784.0)   . TIA (transient ischemic attack) 02/08/2007  . Parkinson's disease (tremor, stiffness, slow motion, unstable posture) 12/11/2006  . PONV (postoperative nausea and vomiting)   . Heart murmur   . Hernia, inguinal, bilateral, recurrent   . Restless leg syndrome     Past Surgical History  Procedure Laterality Date  . Cataract  2008    bilateral  . Cystoscopy/retrograde/ureteroscopy  05/29/2011    Procedure: CYSTOSCOPY/RETROGRADE/URETEROSCOPY;  Surgeon: Malka So, MD;  Location: [facility];  Service: Urology;  Laterality: Left;  no stent   . Colonoscopy    . Cardiac catheterization  10/09/13    No family history on file.  Social History History  Substance Use Topics  . Smoking status: Never Smoker   . Smokeless tobacco: Never Used  . Alcohol Use: No    Prior to Admission medications   Medication Sig Start Date End Date Taking? Authorizing Provider  acetaminophen (TYLENOL) 500 MG tablet Take 1,000 mg by mouth 2 (two) times daily as needed (pain). For pain   Yes Historical Provider, MD  ASA-APAP-Caff Buffered (VANQUISH PO) Take 2 tablets by mouth daily as needed (pain / headache). As needed   Yes Historical Provider, MD  aspirin 325 MG tablet Take 325 mg by mouth daily.   Yes Historical Provider, MD    butalbital-acetaminophen-caffeine (ESGIC PLUS) 50-500-40 MG per tablet Take 1 tablet by mouth every 4 (four) hours as needed (headaches).    Yes Historical Provider, MD  carbidopa-levodopa (SINEMET IR) 25-100 MG per tablet Take 1.5 tablets by mouth See admin instructions. Take 1 and 1/2 tablets at 10am, 2pm and 6pm.   Yes Historical Provider, MD  cholecalciferol (VITAMIN D) 1000 UNITS tablet Take 1,000 Units by mouth at bedtime.    Yes Historical Provider, MD  clonazePAM (KLONOPIN) 1 MG tablet Take 1.5 mg by mouth at bedtime.    Yes Historical Provider, MD  LORazepam (ATIVAN) 0.5 MG tablet Take 0.5 mg by mouth daily.    Yes Historical Provider, MD  omeprazole (PRILOSEC) 20 MG capsule Take 20 mg by mouth at bedtime.    Yes Historical Provider, MD  sertraline (ZOLOFT) 50 MG tablet Take 50 mg by mouth daily.  07/03/13  Yes Historical Provider, MD  traMADol (ULTRAM) 50 MG tablet Take 50 mg by mouth daily as needed (pain).    Yes Historical Provider, MD    Allergies  Allergen Reactions  . Vicodin [Hydrocodone-Acetaminophen] Other (See Comments)    "Mental reaction"    Review of  Systems:              General:                      normal appetite, decreased energy, no weight gain, no weight loss, no fever             Cardiac:                      + chest pain with exertion, no chest pain at rest, + SOB with exertion, no resting SOB, no PND, no orthopnea, no palpitations, no arrhythmia, no atrial fibrillation, no LE edema, no dizzy spells, no syncope             Respiratory:                + exertional shortness of breath, no home oxygen, no productive cough, no dry cough, no bronchitis, no wheezing, no hemoptysis, no asthma, no pain with inspiration or cough, no sleep apnea, no CPAP at night             GI:                                no difficulty swallowing, + reflux, + frequent heartburn, + hiatal hernia, no abdominal pain, no constipation, no diarrhea, no hematochezia, no hematemesis, no  melena             GU:                              no dysuria,  no frequency, no urinary tract infection, no hematuria, no enlarged prostate, + kidney stones, no kidney disease             Vascular:                     no pain suggestive of claudication, no pain in feet, occasional leg cramps, no varicose veins, no DVT, no non-healing foot ulcer             Neuro:                         no stroke, + TIA's, no seizures, + headaches, no temporary blindness one eye,  + slurred speech, no peripheral neuropathy, no chronic pain, + instability of gait, + memory/cognitive dysfunction             Musculoskeletal:         + arthritis, no joint swelling, no myalgias, + difficulty walking, limited mobility               Skin:                            no rash, no itching, no skin infections, no pressure sores or ulcerations             Psych:                         no anxiety, + depression, no nervousness, + unusual recent stress             Eyes:                           +  blurry vision, no floaters, no recent vision changes, + wears glasses or contacts             ENT:                            no hearing loss, no loose or painful teeth, no dentures, last saw dentist last year             Hematologic:               no easy bruising, no abnormal bleeding, no clotting disorder, no frequent epistaxis             Endocrine:                   no diabetes, does not check CBG's at home                                                       Physical Exam:   BP 143/62  Pulse 74  Temp(Src) 98.3 F (36.8 C) (Oral)  Resp 18  Wt 64.864 kg (143 lb)  SpO2 98%, Ht 5\' 6"  (1.676 m)                General:                      Thin and frail-appearing             HEENT:                       Unremarkable               Neck:                           no JVD, no bruits, no adenopathy               Chest:                         clear to auscultation, symmetrical breath sounds, no wheezes, no rhonchi                CV:                              RRR, grade IV/VI late-peaking crescendo/decrescendo murmur heard best at RSB,  no diastolic murmur             Abdomen:                    soft, non-tender, no masses               Extremities:                 warm, well-perfused, pulses palpable, no LE edema             Rectal/GU                   Deferred             Neuro:  Grossly non-focal and symmetrical throughout, + short term memory deficit, very slow speech and mobility             Skin:                            Clean and dry, no rashes, no breakdown   Diagnostic Tests:    Transthoracic Echocardiography  Patient: [name], Demery MR #: [id number] Study Date: 09/12/2013 Gender: M Age: 21 Height: 157.5cm Weight: 64.4kg BSA: 1.85m^2 Pt. Status: Room:  ATTENDING Jaymes Graff, [place], Outpatient cc:  ------------------------------------------------------------ LV EF: 65% - 70%  ------------------------------------------------------------ Indications: 424.1 Aortic valve disorders.  ------------------------------------------------------------ History: PMH: Bicuspid aortic valve. Acquired from the patient and from the patient's chart. Dyspnea. Aortic stenosis.  ------------------------------------------------------------ Study Conclusions  - Left ventricle: The cavity size was normal. Wall thickness was increased in a pattern of mild LVH. Systolic function was vigorous. The estimated ejection fraction was in the range of 65% to 70%. Doppler parameters are consistent with abnormal left ventricular relaxation (grade 1 diastolic dysfunction). - Aortic valve: AV is thickened, calcified with restricted motion. Peak and mean gradients through the valve are 157 and 93 mm Hg respectively consistent with critical AS> Mild regurgitation. - Pulmonary arteries: PA  peak pressure: 10mm Hg (S). Transthoracic echocardiography. M-mode, complete 2D, spectral Doppler, and color Doppler. Height: Height: 157.5cm. Height: 62in. Weight: Weight: 64.4kg. Weight: 141.7lb. Body mass index: BMI: 26kg/m^2. Body surface area: BSA: 1.45m^2. Blood pressure: 118/80. Patient status: Outpatient. Location: [facility]  ------------------------------------------------------------  ------------------------------------------------------------ Left ventricle: The cavity size was normal. Wall thickness was increased in a pattern of mild LVH. Systolic function was vigorous. The estimated ejection fraction was in the range of 65% to 70%. Doppler parameters are consistent with abnormal left ventricular relaxation (grade 1 diastolic dysfunction).  ------------------------------------------------------------ Aortic valve: AV is thickened, calcified with restricted motion. Peak and mean gradients through the valve are 157 and 93 mm Hg respectively consistent with critical AS> Doppler: Mild regurgitation. VTI ratio of LVOT to aortic valve: 0.16. Valve area: 0.59cm^2(VTI). Indexed valve area: 0.36cm^2/m^2 (VTI). Peak velocity ratio of LVOT to aortic valve: 0.16. Valve area: 0.6cm^2 (Vmax). Indexed valve area: 0.36cm^2/m^2 (Vmax). Mean gradient: 36mm Hg (S). Peak gradient: 167mm Hg (S).  ------------------------------------------------------------ Mitral valve: Calcified annulus. Leaflet separation was normal. Doppler: Transvalvular velocity was within the normal range. There was no evidence for stenosis. Trivial regurgitation. Peak gradient: 73mm Hg (D).  ------------------------------------------------------------ Left atrium: The atrium was mildly dilated.  ------------------------------------------------------------ Right ventricle: The cavity size was normal. Wall thickness was normal. Systolic function was  normal.  ------------------------------------------------------------ Pulmonic valve: Structurally normal valve. Cusp separation was normal. Doppler: Transvalvular velocity was within the normal range. Trivial regurgitation.  ------------------------------------------------------------ Tricuspid valve: Structurally normal valve. Leaflet separation was normal. Doppler: Transvalvular velocity was within the normal range. Mild regurgitation.  ------------------------------------------------------------ Right atrium: The atrium was normal in size.  ------------------------------------------------------------ Pericardium: There was no pericardial effusion.  ------------------------------------------------------------ Systemic veins: Inferior vena cava: The vessel was mildly dilated; the respirophasic diameter changes were in the normal range (= 50%).  ------------------------------------------------------------  2D measurements Normal Doppler measurements Normal Left ventricle Main pulmonary LVID ED, 52 mm 43-52 artery chord, Pressure, 33 mm Hg =30 PLAX S LVID ES, 35 mm 23-38 Left ventricle chord, Ea, lat 6.31 cm/s ------ PLAX ann, tiss FS, chord, 33 % >29 DP PLAX E/Ea, lat 13.3 ------  LVPW, ED 12 mm ------ ann, tiss IVS/LVPW 1.08 <1.3 DP ratio, ED Ea, med 5.55 cm/s ------ Ventricular septum ann, tiss IVS, ED 13 mm ------ DP LVOT E/Ea, med 15.1 ------ Diam, S 22 mm ------ ann, tiss 2 Area 3.8 cm^2 ------ DP Diam 22 mm ------ LVOT Aorta Peak vel, 97.9 cm/s ------ Root diam, 31 mm ------ S ED VTI, S 21.7 cm ------ AAo AP 36 mm ------ Stroke vol 82.5 ml ------ diam, S Stroke 50 ml/m^2 ------ Left atrium index AP dim 45 mm ------ Aortic valve AP dim 2.73 cm/m^2 <2.2 Peak vel, 620 cm/s ------ index S Mean vel, 427 cm/s ------ S VTI, S 139 cm ------ Mean 85 mm Hg ------ gradient, S Peak 154 mm Hg ------ gradient, S VTI ratio 0.16 ------ LVOT/AV Area, VTI 0.59  cm^2 ------ Area index 0.36 cm^2/m ------ (VTI) ^2 Peak vel 0.16 ------ ratio, LVOT/AV Area, Vmax 0.6 cm^2 ------ Area index 0.36 cm^2/m ------ (Vmax) ^2 Regurg PHT 287 ms ------ Mitral valve Peak E vel 83.9 cm/s ------ Peak A vel 110 cm/s ------ Decelerati 292 ms 150-23 on time 0 Peak 3 mm Hg ------ gradient, D Peak E/A 0.8 ------ ratio Tricuspid valve Regurg 276 cm/s ------ peak vel Peak RV-RA 30 mm Hg ------ gradient, S Systemic veins Estimated 3 mm Hg ------ CVP Right ventricle Pressure, 33 mm Hg <30 S Sa vel, 15 cm/s ------ lat ann, tiss DP  ------------------------------------------------------------ Prepared and Electronically Authenticated by  Dorris Carnes [id number]             Cardiac Catheterization Procedure Note    Name: [name]   MRN: [id number]   DOB: October 23, 1940   Procedure: Right Heart Cath, Selective Coronary Angiography, Aortic root angiography   Indication: Severe aortic stenosis   Procedural Details: The right groin was prepped, draped, and anesthetized with 1% lidocaine. Using the modified Seldinger technique a 4 French sheath was placed in the right femoral artery and a 7 French sheath was placed in the right femoral vein. A Swan-Ganz catheter was used for the right heart catheterization. Standard protocol was followed for recording of right heart pressures and sampling of oxygen saturations. Fick cardiac output was calculated. Visualization of the LCA was poor with a 4 Fr catheter wo the sheath and JL-4 were upsized to 5 Fr. Standard Judkins catheters were used for selective coronary angiography and left ventriculography. There were no immediate procedural complications. The patient was transferred to the post catheterization recovery area for further monitoring.   Procedural Findings:   Hemodynamics   RA 5   RV 45/7   PA 43/15 mean 26   PCWP 14   LV not recorded   AO 149/74 mean 104   Oxygen saturations:    PA 73   AO 99   SVC 76   Cardiac Output (Fick) 5.4   Cardiac Index (Fick) 3.1   Coronary angiography:   Coronary dominance: right   Left mainstem: Widely patent without obstructive disease   Left anterior descending (LAD): The proximal LAD is smooth without irregularity or obstructive disease. The first diagonal is large and widely patent. The LAD just beyond the diagonal has an eccentric 40% stenosis. The remainder of the vessel is widely patent without disease.   Left circumflex (LCx): Large, dominant vessel. The OM, PLA, and PDA branches are all patent without obstructive disease.   Right coronary artery (RCA): medium caliber, nondominant vessel, supplying a single RV marginal branch. The vessel is  smooth without obstructive disease.   Left ventriculography: Deferred (Ao valve was not crossed)   Aoritc root angiography: The aortic valve has extreme bulky calcification and limited mobility. The left cusp has restricted motion, the other cusp is immobile. There is 2-3+ AI.   Final Conclusions:   1. Patent coronary arteries (left dominant) with mild nonobstructive disease of the LAD   2. Severely calcified, restricted aortic valve (likely bicuspid) with at least moderate AI   3. Essentially normal right heart hemodynamics except for mildly elevated PA pressure   Recommendations: Eval by cardiac surgery for consideration of open surgical AVR versus TAVR.   Sherren Mocha   10/09/2013, 9:44 AM      Cardiac TAVR CT  TECHNIQUE:   The patient was scanned on a Philips 256 scanner. A 120 kV   retrospective scan was triggered in the descending thoracic aorta at   111 HU's. Gantry rotation speed was 270 msecs and collimation was .9   mm. No beta blockade or nitro were given. The 3D data set was   reconstructed in 5% intervals of the R-R cycle. Systolic and   diastolic phases were analyzed on a dedicated work station using   MPR, MIP and VRT modes. The patient received 80 cc of contrast.    FINDINGS:   Aortic Valve: Severely calcified tricuspid aortic valve with   restricted leaflet opening   Aorta: Minimal calcification in the visualized position of the   ascending and descending thoracic aorta. Moderate calcifications in   the aortic arch that is not completely visualized.   Sinotubular Junction: 27 x 29 mm   Ascending Thoracic Aorta: 36 x 36 mm   Descending Thoracic Aorta: 25 x 26 mm   Sinus of Valsalva Measurements:   Non-coronary: 31 mm   Right -coronary: 30 mm   Left -coronary: 33 mm   Coronary Artery Height above Annulus:   Left Main: 13 mm   Right Coronary: 13 mm   Virtual Basal Annulus Measurements:   Maximum/Minimum Diameter: 28 x 24 mm   Perimeter: 94 mm   Area: 526 mm2   Coronary Arteries: Originating in a regular position. Occluded RCA   at the origin. The study quality insufficient to evaluate for   coronary plague.   Optimum Fluoroscopic Angle for Delivery: RAO 0 CAU 0   Annulus to aortic angle distance 71 mm (for transaortic TAVR   delivery).   IMPRESSION:   1. Severely calcified aortic valve with measurements suitable for 26   mm Edward Sapien valve.   2. Sufficient annulus to coronary distance.   3. Optimum Fluoroscopic Angle for Delivery: RAO 0 CAU 0   Ena Dawley   Electronically Signed   By: Ena Dawley   On: 10/20/2013 22:06             Study Result        EXAM:    OVER-READ INTERPRETATION CT CHEST   The following report is an over-read performed by radiologist Dr.   Julian Hy of [facility], [place] on 10/20/2013. This   over-read does not include interpretation of cardiac or coronary   anatomy or pathology. The coronary CTA interpretation by the   cardiologist is attached.   COMPARISON: None.   FINDINGS:   Mild linear scarring in the right middle lobe and lingula. Minimal   dependent atelectasis in the right lower lobe. No suspicious   pulmonary nodules.   No thoracic lymphadenopathy.   Visualized upper  abdomen is unremarkable.  Degenerative changes of the visualized thoracic spine.   IMPRESSION:   No significant extracardiac findings.   Electronically Signed:   By: Julian Hy M.D.   On: 10/20/2013 17:14      CT ANGIOGRAPHY CHEST, ABDOMEN AND PELVIS  TECHNIQUE:  Multidetector CT imaging through the chest, abdomen and pelvis was  performed using the standard protocol during bolus administration of  intravenous contrast. Multiplanar reconstructed images and MIPs were  obtained and reviewed to evaluate the vascular anatomy.  CONTRAST: 119mL OMNIPAQUE IOHEXOL 350 MG/ML SOLN  COMPARISON: Cardiac CT 10/20/2013. CT of the abdomen and pelvis  05/28/2011.  FINDINGS:  CT CHEST FINDINGS  Mediastinum: Heart size is enlarged with left ventricular concentric  hypertrophy. Severely calcified aortic valve. There is no  significant pericardial fluid, thickening or pericardial  calcification. No pathologically enlarged mediastinal are hilar  lymph nodes. Esophagus is unremarkable in appearance.  Lungs/Pleura: Mild subsegmental atelectasis or scarring in the  medial segment of the right middle lobe and inferior segment of the  lingula. No acute consolidative airspace disease. No pleural  effusions. No suspicious appearing pulmonary nodules or masses.  Musculoskeletal: There are no aggressive appearing lytic or blastic  lesions noted in the visualized portions of the skeleton.  CT ABDOMEN AND PELVIS FINDINGS  Abdomen/Pelvis: The appearance of the liver, gallbladder, pancreas,  spleen, bilateral adrenal glands and bilateral kidneys is  unremarkable. No significant volume of ascites. No pneumoperitoneum.  No pathologic distention of small bowel. Large left inguinal hernia  containing a portion of the proximal sigmoid colon, which extends  inferiorly into the left hemiscrotum. Small right inguinal hernia  containing only fat incidentally noted. Prostate gland and urinary  bladder are  unremarkable in appearance.  Musculoskeletal: There are no aggressive appearing lytic or blastic  lesions noted in the visualized portions of the skeleton.  VASCULAR MEASUREMENTS PERTINENT TO TAVR:  AORTA:  Minimal Aortic Diameter - 16 x 16 mm  Severity of Aortic Calcification - Minimal  RIGHT PELVIS:  Right Common Iliac Artery -  Minimal Diameter - 11.2 x 11.5 mm  Tortuosity - Severe  Calcification - None  Right External Iliac Artery -  Minimal Diameter - 8.9 x 9.3 mm  Tortuosity - Moderate to severe  Calcification - None  Right Common Femoral Artery -  Minimal Diameter - 10.3 x 10.2 mm  Tortuosity - Minimal  Calcification - None  LEFT PELVIS:  Left Common Iliac Artery -  Minimal Diameter - 11.3 x 11.0 mm  Tortuosity - Moderate to severe  Calcification - None  Left External Iliac Artery -  Minimal Diameter - 9.6 x 9.2 mm  Tortuosity - Moderate  Calcification - None  Left Common Femoral Artery -  Minimal Diameter - 9.8 x 9.8 mm  Tortuosity - Minimal  Calcification - None  Review of the MIP images confirms the above findings.  IMPRESSION:  1. Findings and measurements pertinent to potential TAVR procedure,  as detailed above. From a size perspective, this patient appears to  have suitable pelvic arterial access, although there is significant  tortuosity in the iliac arteries bilaterally. This appears to be  less severe on the left side.  2. Large left inguinal hernia containing a significant portion of  the proximal sigmoid colon, which extends into the left hemiscrotum.  No signs of associated bowel obstruction at this time.  3. Cardiomegaly with concentric left ventricular hypertrophy.  4. Additional incidental findings, as above.  Electronically Signed  By: Vinnie Langton M.D.  On: 10/31/2013  13:37    STS Risk Calculator  Procedure                                         AVR  Risk of Mortality                                1.7% Morbidity or Mortality                        13.5% Prolonged LOS                                   4.2% Short LOS                                           49.5% Permanent Stroke                            1.1% Prolonged Vent Support                      7.2% DSW Infection                                     0.2% Renal Failure                                       2.8% Reoperation                                        7.1%    Impression:  The patient has critical aortic stenosis with preserved left ventricular systolic function and no significant coronary artery disease. His physical activity is severely limited, but he does admit to a gradual progression of symptoms of exertional shortness of breath and fatigue consistent with chronic diastolic congestive heart failure, New [place] Heart Association functional class 1-2.  The patient has a long history of Parkinson's disease which has slowly progressed over the past 10 years. He is now very limited with very slow speech, limited mobility, and mild dementia. He is otherwise remarkably healthy. However, risks associated with conventional surgical aortic valve replacement would unquestionably be much higher than that predicted using traditional risk models because of the patient's underlying Parkinson's disease with limited mobility and dementia. Although his mortality risk may not be very high, he would be at particularly high-risk for major morbidity after conventional surgery, including the potential for stroke, pneumonia, respiratory failure, aspiration, and/or delirium.  Recovery from conventional surgery would likely come with a need for long-term placement in some type of rehab or skilled nursing facility.  Transcatheter aortic valve replacement may prove to be a very attractive alternative.    Plan:  The patient and his family were counseled at length regarding treatment alternatives for management of severe symptomatic aortic stenosis. Alternative approaches such as  conventional  aortic valve replacement, transcatheter aortic valve replacement, and palliative medical therapy were compared and contrasted at length.  Long-term prognosis with medical therapy was discussed including the possibility of continued medical therapy with very close follow-up.  With medical therapy the associated risks of the development of heart failure, ventricular dysfunction, syncope, and sudden death were discussed.  The risks associated with conventional surgical aortic valve replacement were discussed in detail, as were expectations for his post-operative convalescence. This discussion was placed in the context of the patient's own specific clinical presentation and past medical history.    Following the decision to proceed with transcatheter aortic valve replacement, a discussion has been held regarding what types of management strategies would be attempted intraoperatively in the event of life-threatening complications, including whether or not the patient would be considered a candidate for the use of cardiopulmonary bypass and/or conversion to open sternotomy for attempted surgical intervention.  The patient has been advised of a variety of complications that might develop including but not limited to risks of death, stroke, paravalvular leak, aortic dissection or other major vascular complications, aortic annulus rupture, device embolization, cardiac rupture or perforation, mitral regurgitation, acute myocardial infarction, arrhythmia, heart block or bradycardia requiring permanent pacemaker placement, congestive heart failure, respiratory failure, renal failure, pneumonia, infection, other late complications related to structural valve deterioration or migration, or other complications that might ultimately cause a temporary or permanent loss of functional independence or other long term morbidity.  The patient provides full informed consent for the procedure as described and all questions  were answered.    Valentina Gu. Roxy Manns, MD

## 2013-11-18 NOTE — Op Note (Signed)
[facility]  TAVR OPERATIVE NOTE   Date of Procedure:  11/18/2013  Preoperative Diagnosis: Severe Aortic Stenosis   Postoperative Diagnosis: Same   Procedure:    Transcatheter Aortic Valve Replacement - Transfemoral Approach  Edwards Sapien XT THV (size 26 mm, model # 9300TFX, serial # [id number])   Co-Surgeons:  Valentina Gu. Roxy Manns, MD and Sherren Mocha, MD  Assistants:   Gaye Pollack, MD and Lauree Chandler, MD  Anesthesiologist:  Annye Asa, MD  Echocardiographer:  Ena Dawley, MD  Pre-operative Echo Findings:         severe aortic stenosis  Normal left ventricular systolic function  Moderate AI  Post-operative Echo Findings:  2+ perivalvular AI  normal left ventricular systolic function   BRIEF CLINICAL NOTE AND INDICATIONS FOR SURGERY  73 year-old male with severe symptomatic aortic stenosis and advanced Parkinson's Disease presenting for TAVR.  During the course of the patient's preoperative work up they have been evaluated comprehensively by a multidisciplinary team of specialists coordinated through the [facility] in the [facility].  They have been demonstrated to suffer from symptomatic severe aortic stenosis as noted above. The patient has been counseled extensively as to the relative risks and benefits of all options for the treatment of severe aortic stenosis including long term medical therapy, conventional surgery for aortic valve replacement, and transcatheter aortic valve replacement.  The patient has been independently evaluated by two cardiac surgeons including Dr Roxy Manns and Dr. Cyndia Bent, and they are felt to be at high risk for conventional surgical aortic valve replacement. Both surgeons indicated the patient would be a poor candidate for conventional surgery (predicted risk of mortality >15% and/or predicted risk of permanent morbidity >50%) because of comorbidities including  Parkinson's Disease with severely limited mobility, mild dementia.   Based upon review of all of the patient's preoperative diagnostic tests they are felt to be candidate for transcatheter aortic valve replacement using the transfemoral approach as an alternative to high risk conventional surgery.    Following the decision to proceed with transcatheter aortic valve replacement, a discussion has been held regarding what types of management strategies would be attempted intraoperatively in the event of life-threatening complications, including whether or not the patient would be considered a candidate for the use of cardiopulmonary bypass and/or conversion to open sternotomy for attempted surgical intervention.  The patient has been advised of a variety of complications that might develop peculiar to this approach including but not limited to risks of death, stroke, paravalvular leak, aortic dissection or other major vascular complications, aortic annulus rupture, device embolization, cardiac rupture or perforation, acute myocardial infarction, arrhythmia, heart block or bradycardia requiring permanent pacemaker placement, congestive heart failure, respiratory failure, renal failure, pneumonia, infection, other late complications related to structural valve deterioration or migration, or other complications that might ultimately cause a temporary or permanent loss of functional independence or other long term morbidity.  The patient provides full informed consent for the procedure as described and all questions were answered preoperatively.    DETAILS OF THE OPERATIVE PROCEDURE  PREPARATION:    The patient is brought to the operating room on the above mentioned date and central monitoring was established by the anesthesia team including placement of Swan-Ganz catheter and radial arterial line. The patient is placed in the supine position on the operating table.  Intravenous antibiotics are administered.  General endotracheal anesthesia is induced uneventfully. A Foley catheter is placed.  Baseline transesophageal echocardiogram was  performed. The patient's chest, abdomen, both groins, and both lower extremities are prepared and draped in a sterile manner. A time out procedure is performed.   PERIPHERAL ACCESS:    Using the modified Seldinger technique, femoral arterial and venous access was obtained with placement of 6 Fr sheaths on the left side.  A pigtail diagnostic catheter was passed through the left femoral arterial sheath under fluoroscopic guidance into the aortic root.  A temporary transvenous pacemaker catheter was passed through the left femoral venous sheath under fluoroscopic guidance into the right ventricle.  In addition, the right radial artery was accessed with a 5/6 Fr Slender Sheath for potential access to the left coronary in case of coronary obstruction. The pacemaker was tested to ensure stable lead placement and pacemaker capture. Aortic root angiography was performed in order to determine the optimal angiographic angle for valve deployment.   TRANSFEMORAL ACCESS:   A right femoral arterial cutdown was performed by Dr Roxy Manns. Please see his separate operative note for details. The patient was heparinized systemically and ACT verified > 250 seconds.    A 20 Fr transfemoral sheath was introduced into the right femoral artery after progressively dilating over an Amplatz superstiff wire. An AL-1 catheter was used to direct a straight-tip exchange length wire across the native aortic valve into the left ventricle. This was exchanged out for a pigtail catheter and position was confirmed in the LV apex. Simultaneous LV and Ao pressures were recorded.  The pigtail catheter was then exchanged for an Amplatz Extra-stiff wire in the LV apex. At that point, BAV was performed using a 25 mm valvuloplasty balloon.  We performed an aortic root angiogram during BAV to evaluate valve size. Once  optimal position was achieved, BAV was done under rapid ventricular pacing at 180 bpm. The patient recovered well hemodynamically. The 25 mm balloon appeared to completely seal the aortic valve annulus and we elected to prep a 26 mm Sapien Valve. There was no evidence of left main obstruction during BAV.  TRANSCATHETER HEART VALVE DEPLOYMENT:  An Edwards Sapien XT THV (size 26 mm) was prepared and crimped per manufacturer's guidelines, and the proper orientation of the valve is confirmed on the [employer] delivery system. The valve was advanced through the introducer sheath using normal technique until in an appropriate position in the abdominal aorta beyond the sheath tip. The balloon was then retracted and using the fine-tuning wheel was centered on the valve. The valve was then advanced across the aortic arch using appropriate flexion of the catheter. The valve was carefully positioned across the aortic valve annulus. The Novaflex catheter was retracted using normal technique. Once final position of the valve has been confirmed by angiographic assessment, the valve is deployed while temporarily holding ventilation and during rapid ventricular pacing to maintain systolic blood pressure < 50 mmHg and pulse pressure < 10 mmHg. The balloon inflation is held for >3 seconds after reaching full deployment volume. Once the balloon has fully deflated the balloon is retracted into the ascending aorta and valve function is assessed using TEE. There is felt to be 2+ paravalvular leak and no central aortic insufficiency.  The patient's hemodynamic recovery following valve deployment is good.  Because of the aortic insufficiency, an additional cc of fluid was inserted into the Atrion device and a post-dilatation was performed under rapid pacing. The deployment balloon and guidewire are both removed. Echo demostrated acceptable post-procedural gradients, stable mitral valve function, and 2+ AI. Aortography  confirmed moderate  AI.   PROCEDURE COMPLETION:  The sheath was then removed and arteriotomy repaired by Dr Roxy Manns. Please see his separate report for details. Distal abdominal aortography was performed to evaluate for any arterial injury related to the procedure. There was no evidence dissection, perforation, or other vascular injury in the abdominal aorta, iliac artery, or femoral artery.  Protamine was administered once femoral arterial repair was complete. The temporary pacemaker, pigtail catheters and femoral sheaths were removed with manual pressure used for hemostasis.   The patient tolerated the procedure well and is transported to the surgical intensive care in stable condition. There were no immediate intraoperative complications. All sponge instrument and needle counts are verified correct at completion of the operation.   No blood products were administered during the operation.  The patient received a total of 116 mL of intravenous contrast during the procedure.  Sherren Mocha 11/18/2013 2:04 PM

## 2013-11-18 NOTE — Progress Notes (Signed)
Echo Lab  Transesophageal Echocardiogram completed.  [place], RDCS 11/18/2013 2:42 PM

## 2013-11-18 NOTE — Anesthesia Preprocedure Evaluation (Addendum)
Anesthesia Evaluation  Patient identified by MRN, date of birth, ID band Patient awake    Reviewed: Allergy & Precautions, H&P , NPO status , Patient's Chart, lab work & pertinent test results  History of Anesthesia Complications (+) PONV and history of anesthetic complications  Airway Mallampati: II TM Distance: >3 FB Neck ROM: Full    Dental  (+) Teeth Intact, Chipped, Poor Dentition, Dental Advisory Given   Pulmonary neg pulmonary ROS,  breath sounds clear to auscultation        Cardiovascular + CAD (mild, non-obstructive ASCADz by cath 5/15) Rhythm:Regular Rate:Normal  5/15 ECHO: EF 65-70% with grade 1 diastolic dysfunction, severe AS with AVA 0.6 cm2, Mean gradient: 79mm Hg (S). Peak gradient: 175mm Hg (S).    Neuro/Psych Anxiety Parkinson's disease TIA   GI/Hepatic Neg liver ROS, GERD-  Medicated and Controlled,  Endo/Other  negative endocrine ROS  Renal/GU negative Renal ROS     Musculoskeletal   Abdominal   Peds  Hematology   Anesthesia Other Findings   Reproductive/Obstetrics                       Anesthesia Physical Anesthesia Plan  ASA: III  Anesthesia Plan: General   Post-op Pain Management:    Induction: Intravenous  Airway Management Planned: Oral ETT  Additional Equipment: Arterial line, PA Cath, 3D TEE and Ultrasound Guidance Line Placement  Intra-op Plan:   Post-operative Plan: Extubation in OR  Informed Consent: I have reviewed the patients History and Physical, chart, labs and discussed the procedure including the risks, benefits and alternatives for the proposed anesthesia with the patient or authorized representative who has indicated his/her understanding and acceptance.   Dental advisory given  Plan Discussed with: Surgeon and CRNA  Anesthesia Plan Comments: (Plan routine monitors, A line, PA cath with GETA and TEE)        Anesthesia Quick  Evaluation

## 2013-11-18 NOTE — Anesthesia Procedure Notes (Addendum)
Anesthesia Procedure Note PA catheter:  Routine monitors. Timeout, sterile prep, drape, FBP R neck.  Trendelenburg position.  1% [place]ido local, finder and trocar RIJ 1st pass with US guidance.  Cordis placed over J wire. PA catheter in easily.  Sterile dressing applied.  Patient tolerated well, VSS.  Jenita Seashore, MD  10:36-10:50 Procedure Name: Intubation Date/Time: 11/18/2013 11:55 AM Performed by: Barrington Ellison Pre-anesthesia Checklist: Patient identified, Emergency Drugs available, Suction available and Patient being monitored Patient Re-evaluated:Patient Re-evaluated prior to inductionOxygen Delivery Method: Circle system utilized Preoxygenation: Pre-oxygenation with 100% oxygen Intubation Type: IV induction Ventilation: Mask ventilation without difficulty Laryngoscope Size: Mac and 3 Grade View: Grade I Tube type: Oral Tube size: 8.0 mm Number of attempts: 1 Airway Equipment and Method: Stylet Placement Confirmation: ETT inserted through vocal cords under direct vision,  positive ETCO2 and breath sounds checked- equal and bilateral Secured at: 22 cm Tube secured with: Tape Dental Injury: Teeth and Oropharynx as per pre-operative assessment

## 2013-11-18 NOTE — OR Nursing (Signed)
1st call to SICU (stephanie)

## 2013-11-18 NOTE — Transfer of Care (Signed)
Immediate Anesthesia Transfer of Care Note  Patient: [name].  Procedure(s) Performed: Procedure(s): TRANSCATHETER AORTIC VALVE REPLACEMENT, TRANSFEMORAL (N/A) INTRAOPERATIVE TRANSESOPHAGEAL ECHOCARDIOGRAM (N/A)  Patient Location: SICU  Anesthesia Type:General  Level of Consciousness: awake, alert  and oriented  Airway & Oxygen Therapy: Patient Spontanous Breathing and Patient connected to nasal cannula oxygen  Post-op Assessment: Report given to PACU RN  Post vital signs: Reviewed and stable  Complications: No apparent anesthesia complications

## 2013-11-18 NOTE — OR Nursing (Signed)
2nd call SICU Kieth Brightly)

## 2013-11-19 ENCOUNTER — Inpatient Hospital Stay (HOSPITAL_COMMUNITY): Payer: Medicare Other

## 2013-11-19 DIAGNOSIS — I517 Cardiomegaly: Secondary | ICD-10-CM

## 2013-11-19 DIAGNOSIS — I359 Nonrheumatic aortic valve disorder, unspecified: Principal | ICD-10-CM

## 2013-11-19 LAB — BASIC METABOLIC PANEL
ANION GAP: 14 (ref 5–15)
BUN: 14 mg/dL (ref 6–23)
CHLORIDE: 101 meq/L (ref 96–112)
CO2: 24 mEq/L (ref 19–32)
CREATININE: 0.91 mg/dL (ref 0.50–1.35)
Calcium: 8.8 mg/dL (ref 8.4–10.5)
GFR calc non Af Amer: 82 mL/min — ABNORMAL LOW (ref >90)
Glucose, Bld: 123 mg/dL — ABNORMAL HIGH (ref 70–99)
Potassium: 4.1 mEq/L (ref 3.7–5.3)
SODIUM: 139 meq/L (ref 137–147)

## 2013-11-19 LAB — MAGNESIUM: Magnesium: 1.7 mg/dL (ref 1.5–2.5)

## 2013-11-19 LAB — GLUCOSE, CAPILLARY
GLUCOSE-CAPILLARY: 122 mg/dL — AB (ref 70–99)
GLUCOSE-CAPILLARY: 125 mg/dL — AB (ref 70–99)

## 2013-11-19 LAB — CBC
HCT: 32.9 % — ABNORMAL LOW (ref 39.0–52.0)
Hemoglobin: 10.8 g/dL — ABNORMAL LOW (ref 13.0–17.0)
MCH: 28.6 pg (ref 26.0–34.0)
MCHC: 32.8 g/dL (ref 30.0–36.0)
MCV: 87.3 fL (ref 78.0–100.0)
PLATELETS: 78 10*3/uL — AB (ref 150–400)
RBC: 3.77 MIL/uL — ABNORMAL LOW (ref 4.22–5.81)
RDW: 13.6 % (ref 11.5–15.5)
WBC: 10.8 10*3/uL — AB (ref 4.0–10.5)

## 2013-11-19 MED ORDER — ASPIRIN 81 MG PO CHEW
81.0000 mg | CHEWABLE_TABLET | Freq: Every day | ORAL | Status: DC
  Administered 2013-11-19 – 2013-11-21 (×3): 81 mg via ORAL
  Filled 2013-11-19 (×2): qty 1

## 2013-11-19 MED ORDER — LORAZEPAM 0.5 MG PO TABS
0.5000 mg | ORAL_TABLET | Freq: Every day | ORAL | Status: DC
Start: 2013-11-19 — End: 2013-11-21
  Administered 2013-11-19 – 2013-11-21 (×3): 0.5 mg via ORAL
  Filled 2013-11-19 (×3): qty 1

## 2013-11-19 MED FILL — Heparin Sodium (Porcine) Inj 1000 Unit/ML: INTRAMUSCULAR | Qty: 30 | Status: AC

## 2013-11-19 MED FILL — Dextrose Inj 5%: INTRAVENOUS | Qty: 250 | Status: AC

## 2013-11-19 MED FILL — Norepinephrine Bitartrate IV Soln 1 MG/ML (Base Equivalent): INTRAVENOUS | Qty: 8 | Status: AC

## 2013-11-19 MED FILL — Potassium Chloride Inj 2 mEq/ML: INTRAVENOUS | Qty: 40 | Status: AC

## 2013-11-19 MED FILL — Magnesium Sulfate Inj 50%: INTRAMUSCULAR | Qty: 10 | Status: AC

## 2013-11-19 MED FILL — Insulin Regular (Human) Inj 100 Unit/ML: INTRAMUSCULAR | Qty: 1 | Status: AC

## 2013-11-19 NOTE — Progress Notes (Signed)
Echo Lab  2D Echocardiogram completed.  [place], RDCS 11/19/2013 9:58 AM

## 2013-11-19 NOTE — Progress Notes (Signed)
Pt received into room 2w34, pt oriented to room and call bell, pt assisted to eat breakfast, tele placed on pt, pt continues to ask "where are my shoes?" pt disoriented to time,pt very soft spoken and keeps eyes closed, will continue to monitor Rickard Rhymes, RN

## 2013-11-19 NOTE — Evaluation (Signed)
Occupational Therapy Evaluation Patient Details Name: [name]. MRN: [id number] DOB: [birth year] Today's Date: 11/19/2013    History of Present Illness This 73 y.o. with h/o Parkinson's disease admitted 11/18/13 for Transcatheter Aortic Valve Replacement - Open Right Transfemoral Approach.    Clinical Impression   Pt admitted with above. He demonstrates the below listed deficits and will benefit from continued OT to maximize safety and independence with BADLs.  Pt presents to OT with generalized weakness and deconditioning, as well as impaired balance and impaired coordination from the Parkinson's disease.  He is very motivated and did very well POD #1.  Family is very supportive and can provide 24 hour assist if needed.  Currently, he requires min - mod A for BADLs - LB ADLs limited by pain.  Anticipate good progress.  Recommend HHOT       Follow Up Recommendations  Home health OT;Supervision/Assistance - 24 hour    Equipment Recommendations  3 in 1 bedside comode    Recommendations for Other Services       Precautions / Restrictions Precautions Precautions: Fall Precaution Comments: Pt and family deny history of falls       Mobility Bed Mobility                  Transfers Overall transfer level: Needs assistance Equipment used: 1 person hand held assist Transfers: Sit to/from Stand Sit to Stand: Min assist         General transfer comment: Pt leans posteriorly - requires facilitation for forward excursion     Balance Overall balance assessment: Needs assistance Sitting-balance support: Feet supported Sitting balance-Leahy Scale: Fair     Standing balance support: Single extremity supported Standing balance-Leahy Scale: Poor Standing balance comment: Pt rocks back on heels initially upon standing                             ADL Overall ADL's : Needs assistance/impaired Eating/Feeding: Set up;Sitting   Grooming: Wash/dry  hands;Wash/dry face;Brushing hair;Oral care;Minimal assistance;Standing   Upper Body Bathing: Minimal assitance;Sitting   Lower Body Bathing: Moderate assistance;Sit to/from stand   Upper Body Dressing : Minimal assistance;Sitting   Lower Body Dressing: Maximal assistance;Sit to/from stand Lower Body Dressing Details (indicate cue type and reason): Pt unable to access feet due to groin pain - was able to reach to ankles Toilet Transfer: Minimal assistance;Ambulation;Comfort height toilet   Toileting- Clothing Manipulation and Hygiene: Moderate assistance;Sit to/from stand       Functional mobility during ADLs: Minimal assistance General ADL Comments: Pt moves slowly.  Requires cues for safety.        Vision                     Perception     Praxis      Pertinent Vitals/Pain See vitals flow sheet     Hand Dominance Right   Extremity/Trunk Assessment Upper Extremity Assessment Upper Extremity Assessment: RUE deficits/detail;LUE deficits/detail;Generalized weakness RUE Deficits / Details: intention tremor RUE Coordination: decreased fine motor;decreased gross motor LUE Deficits / Details: intention tremor LUE Coordination: decreased fine motor;decreased gross motor   Lower Extremity Assessment Lower Extremity Assessment: Defer to PT evaluation   Cervical / Trunk Assessment Cervical / Trunk Assessment: Kyphotic   Communication Communication Communication: Expressive difficulties (low volume)   Cognition Arousal/Alertness: Awake/alert Behavior During Therapy: WFL for tasks assessed/performed Overall Cognitive Status: Impaired/Different from baseline Area of Impairment: Memory;Problem solving  Problem Solving: Slow processing;Decreased initiation General Comments: Pt with post op confusion.  family reports it has improved   General Comments       Exercises       Shoulder Instructions      Home Living Family/patient expects to be  discharged to:: Private residence Living Arrangements: Alone Available Help at Discharge: Family;Available PRN/intermittently Type of Home: House Home Access: Stairs to enter [employer] of Steps: 4 Entrance Stairs-Rails: None Home Layout: Two level;Laundry or work area in basement Alternate Therapist, sports of Steps: full flight   Bathroom Shower/Tub: Risk analyst characteristics: Architectural technologist: Standard Bathroom Accessibility: Yes How Accessible: Accessible via walker Home Equipment: Environmental consultant - 2 wheels;Cane - single point;Grab bars - tub/shower   Additional Comments: Family checks on pt multiple times per day.        Prior Functioning/Environment          Comments: Pt ambulates in the home without AD.  He negotiates steps into the basement when family is present - uses the rail.  He performs all BADLs.  Does not drive     OT Diagnosis: Generalized weakness;Cognitive deficits   OT Problem List: Decreased strength;Decreased activity tolerance;Impaired balance (sitting and/or standing);Decreased coordination;Decreased cognition;Decreased safety awareness;Decreased knowledge of use of DME or AE;Pain   OT Treatment/Interventions: Self-care/ADL training;Neuromuscular education;DME and/or AE instruction;Therapeutic activities;Cognitive remediation/compensation;Patient/family education;Balance training    OT Goals(Current goals can be found in the care plan section) Acute Rehab OT Goals Patient Stated Goal: To get better OT Goal Formulation: With patient Time For Goal Achievement: 12/03/13 Potential to Achieve Goals: Good ADL Goals Pt Will Perform Grooming: with supervision;standing Pt Will Perform Lower Body Bathing: with supervision;sit to/from stand Pt Will Perform Lower Body Dressing: with supervision;sit to/from stand Pt Will Transfer to Toilet: with supervision;regular height toilet;grab bars;ambulating Pt Will Perform Toileting -  Clothing Manipulation and hygiene: with supervision;sit to/from stand Pt Will Perform Tub/Shower Transfer: Tub transfer;with min guard assist;shower seat;ambulating;grab bars;rolling walker  OT Frequency: Min 2X/week   Barriers to D/C:    family available to provide 24 hour assist if needed       Co-evaluation              End of Session Nurse Communication: Patient requests pain meds;Mobility status  Activity Tolerance: Patient tolerated treatment well Patient left: in chair;with call bell/phone within reach;with nursing/sitter in room;with family/visitor present   Time: 5465-0354 OT Time Calculation (min): 29 min Charges:  OT General Charges $OT Visit: 1 Procedure OT Evaluation $Initial OT Evaluation Tier I: 1 Procedure OT Treatments $Self Care/Home Management : 8-22 mins $Therapeutic Activity: 8-22 mins G-Codes:    Kailiana Granquist M 2013-12-03, 9:31 PM

## 2013-11-19 NOTE — Care Management Note (Signed)
    Page 1 of 2   11/21/2013     1:37:54 PM CARE MANAGEMENT NOTE 11/21/2013  Patient:  [name], [name]   Account Number:  [id number]  Date Initiated:  11/19/2013  Documentation initiated by:  Mellody Masri  Subjective/Objective Assessment:   Pt s/p TAVR on 11/18/13.  PTA, lives alone, per report, but has supportive daughters.     Action/Plan:   PT eval pending to evaluate for home needs.  Will follow for recommendations.   Anticipated DC Date:  11/21/2013   Anticipated DC Plan:  [place]  CM consult      [facility] Choice  HOME HEALTH   Choice offered to / List presented to:  C-4 Adult Children        HH arranged  HH-1 RN  [place].   Status of service:  Completed, signed off Medicare Important Message given?  YES (If response is "NO", the following Medicare IM given date fields will be blank) Date Medicare IM given:  11/21/2013 Medicare IM given by:  Omarion Minnehan Date Additional Medicare IM given:   Additional Medicare IM given by:    Discharge Disposition:  [place]  Per UR Regulation:  Reviewed for med. necessity/level of care/duration of stay  If discussed at [place] of Stay Meetings, dates discussed:    Comments:  11/21/13 Ellan Lambert, RN, BSN [phone] Pt for dc home today with daughters.  Will need HH follow up.  Referral to [facility], per pt choice.  Start of care 24-48h post dc date.  Denies any DME needs for home. Best contact for home health is Mitchel Delduca, daughter: cell:  [phone]  11/20/13 Ellan Lambert, RN, BSN [phone] PT/OT recommending HHPT and OT at dc; would also recommend [facility] for restorative care.  Please leave orders, face to face documentation if you agree.

## 2013-11-19 NOTE — Progress Notes (Signed)
PT Cancellation Note  Patient Details Name: [name]. MRN: [id number] DOB: 07/10/1940   Cancelled Treatment:    Reason Eval/Treat Not Completed: Other (comment) (Family stated pt didn't sleep last night and didn't want him woken up at this time.)   [facility] 11/19/2013, 11:49 AM

## 2013-11-19 NOTE — Progress Notes (Signed)
  Attempted to call back to receive report, no answer Rickard Rhymes, RN

## 2013-11-19 NOTE — Progress Notes (Signed)
    Subjective:  No CP or dyspnea. No complaints this am.  Objective:  Vital Signs in the last 24 hours: Temp:  [97.3 F (36.3 C)-99.2 F (37.3 C)] 99 F (37.2 C) (07/08 0406) Pulse Rate:  [54-103] 87 (07/08 0600) Resp:  [11-25] 18 (07/08 0600) BP: (103-146)/(51-72) 138/65 mmHg (07/08 0600) SpO2:  [91 %-100 %] 100 % (07/08 0600) Arterial Line BP: (98-188)/(51-89) 149/67 mmHg (07/08 0600) Weight:  [64.2 kg (141 lb 8.6 oz)-64.864 kg (143 lb)] 64.2 kg (141 lb 8.6 oz) (07/08 0500)  Intake/Output from previous day: 07/07 0701 - 07/08 0700 In: 2429.4 [I.V.:2129.4; IV Piggyback:300] Out: 1915 [Urine:1815; Blood:100]  Physical Exam: Pt is alert and oriented, NAD, Parkinsonian features HEENT: normal Neck: JVP - normal Lungs: CTA bilaterally CV: RRR with 2/6 SEM at the LSB, no diastolic murmur Abd: soft, NT, Positive BS, no hepatomegaly Ext: no C/C/E, distal pulses intact and equal, groin sites clear Skin: warm/dry no rash   Lab Results:  Recent Labs  11/18/13 1500 11/18/13 1508 11/19/13 0434  WBC 7.6  --  10.8*  HGB 12.1* 12.6* 10.8*  PLT 94*  --  78*    Recent Labs  11/17/13 1005  11/18/13 1508 11/19/13 0434  NA 141  < > 138 139  K 4.3  < > 3.7 4.1  CL 104  < > 110 101  CO2 23  --   --  24  GLUCOSE 148*  < > 135* 123*  BUN 20  < > 13 14  CREATININE 0.90  < > 0.80 0.91  < > = values in this interval not displayed. No results found for this basename: TROPONINI, CK, MB,  in the last 72 hours  Cardiac Studies: 2D Echo pending  Tele: Reviewed: sinus rhythm  Assessment/Plan:  1. Severe AS s/p TAVR POD #1 2. Parkinson's Disease 3. Post-op anemia, mild 4. Thrombocytopenia  Pt stable. Plan routine post-TAVR care with ASA, plavix, metoprolol. 2D echo today. PT/OT/cardiac rehab. Tx tele bed 2W. D/C foley, art line.  Sherren Mocha, M.D. 11/19/2013, 7:17 AM

## 2013-11-19 NOTE — Progress Notes (Addendum)
      [phone]       [place] [address]             [phone]        CARDIOTHORACIC SURGERY PROGRESS NOTE   R1 Day Post-Op Procedure(s) (LRB): TRANSCATHETER AORTIC VALVE REPLACEMENT, TRANSFEMORAL (N/A) INTRAOPERATIVE TRANSESOPHAGEAL ECHOCARDIOGRAM (N/A)  Subjective: Looks good.  Denies pain, SOB  Objective: Vital signs: BP Readings from Last 1 Encounters:  11/19/13 144/71   Pulse Readings from Last 1 Encounters:  11/19/13 90   Resp Readings from Last 1 Encounters:  11/19/13 17   Temp Readings from Last 1 Encounters:  11/19/13 98.9 F (37.2 C) Oral    Hemodynamics: PAP: (29-44)/(11-22) 32/16 mmHg CO:  [6 L/min] 6 L/min CI:  [3.5 L/min/m2] 3.5 L/min/m2  Physical Exam:  Rhythm:   sinus  Breath sounds: clear  Heart sounds:  RRR w/ soft systolic murmur, no diastolic murmur  Incisions:  Dressing dry, intact  Abdomen:  Soft, non-distended, non-tender  Extremities:  Warm, well-perfused    Intake/Output from previous day: 07/07 0701 - 07/08 0700 In: 2429.4 [I.V.:2129.4; IV Piggyback:300] Out: 2005 [Urine:1905; Blood:100] Intake/Output this shift:    Lab Results:  CBC: Recent Labs  11/18/13 1500 11/18/13 1508 11/19/13 0434  WBC 7.6  --  10.8*  HGB 12.1* 12.6* 10.8*  HCT 36.0* 37.0* 32.9*  PLT 94*  --  78*    BMET:  Recent Labs  11/17/13 1005  11/18/13 1508 11/19/13 0434  NA 141  < > 138 139  K 4.3  < > 3.7 4.1  CL 104  < > 110 101  CO2 23  --   --  24  GLUCOSE 148*  < > 135* 123*  BUN 20  < > 13 14  CREATININE 0.90  < > 0.80 0.91  CALCIUM 9.0  --   --  8.8  < > = values in this interval not displayed.   CBG (last 3)   Recent Labs  11/18/13 1925 11/19/13 0039 11/19/13 0352  GLUCAP 125* 125* 122*    ABG    Component Value Date/Time   PHART 7.361 11/18/2013 1511   PCO2ART 47.5* 11/18/2013 1511   PO2ART 119.0* 11/18/2013 1511   HCO3 27.0* 11/18/2013 1511   TCO2 28 11/18/2013 1511   ACIDBASEDEF 1.0 10/09/2013 0919   O2SAT 98.0 11/18/2013 1511    CXR: PORTABLE CHEST - 1 VIEW  COMPARISON: 11/18/2013  FINDINGS:  Swan-Ganz catheter removed. No pneumothorax.  Decreased lung volume and increase in bibasilar atelectasis compared  with the prior study. Pulmonary vascular congestion has progressed  likely due to hypoventilation. Small left effusion.  IMPRESSION:  Increase in bibasilar atelectasis secondary to hypoventilation.  Electronically Signed  By: Franchot Gallo M.D.  On: 11/19/2013 08:15    Assessment/Plan: S/P Procedure(s) (LRB): TRANSCATHETER AORTIC VALVE REPLACEMENT, TRANSFEMORAL (N/A) INTRAOPERATIVE TRANSESOPHAGEAL ECHOCARDIOGRAM (N/A)  Overall doing very well POD1 Maintaining NSR w/ stable BP Expected post op atelectasis, mild Expected post op acute blood loss anemia, very mild Post op thrombocytopenia, mild Parkinson's w/ underlying limited mobility   Mobilize  Pulm toilet  Routine f/u ECHO  Watch platelet count  Transfer floor   Asley Baskerville H 11/19/2013 8:20 AM

## 2013-11-19 NOTE — Progress Notes (Signed)
CARDIAC REHAB PHASE I   PRE:  Rate/Rhythm: 72 SR  BP:  Supine:   Sitting: 133/80  Standing:    SaO2: 99% 1.5 L, 96%RA  MODE:  Ambulation: 32 ft   POST:  Rate/Rhythm: 84 SR  BP:  Supine:   Sitting: 127/64  Standing:    SaO2: 97%RA 1310-1447 Pt assisted to side of bed. Used gait belt to help steady pt once up. He was leaning backward. Encouraged pt to stand upright. He was able to walk 32 ft on RA with rolling walker and asst x 2 with gait belt use. Pt crossed feet and got walker out too far. Encouraged pt to keep feet farther apart and to take bigger steps. To recliner after walk. Daughter in room. Will see pt along with PT services.   Graylon Good, RN BSN  11/19/2013 1:43 PM

## 2013-11-20 DIAGNOSIS — I359 Nonrheumatic aortic valve disorder, unspecified: Secondary | ICD-10-CM

## 2013-11-20 LAB — CBC
HCT: 32.8 % — ABNORMAL LOW (ref 39.0–52.0)
Hemoglobin: 10.7 g/dL — ABNORMAL LOW (ref 13.0–17.0)
MCH: 28.6 pg (ref 26.0–34.0)
MCHC: 32.6 g/dL (ref 30.0–36.0)
MCV: 87.7 fL (ref 78.0–100.0)
Platelets: 63 10*3/uL — ABNORMAL LOW (ref 150–400)
RBC: 3.74 MIL/uL — ABNORMAL LOW (ref 4.22–5.81)
RDW: 13.7 % (ref 11.5–15.5)
WBC: 8 10*3/uL (ref 4.0–10.5)

## 2013-11-20 NOTE — Progress Notes (Signed)
Occupational Therapy Treatment Patient Details Name: [name]. MRN: [id number] DOB: [birth year] Today's Date: 11/20/2013    History of present illness This 73 y.o. with h/o Parkinson's disease admitted 11/18/13 for Transcatheter Aortic Valve Replacement - Open Right Transfemoral Approach.    OT comments  Pt doing well - up in chair with sister present.   Follow Up Recommendations  Home health OT;Supervision/Assistance - 24 hour    Equipment Recommendations  3 in 1 bedside comode       Precautions / Restrictions Precautions Precautions: Fall Precaution Comments: Pt and family deny history of falls        Mobility Bed Mobility Overal bed mobility: Needs Assistance Bed Mobility: Supine to Sit     Supine to sit: Min assist;Min guard     General bed mobility comments: pt in chair  Transfers Overall transfer level: Needs assistance Equipment used: 1 person hand held assist Transfers: Sit to/from Stand Sit to Stand: Min assist         General transfer comment: Pt leans posteriorly - requires facilitation, cues and incr time for forward excursion     Balance Overall balance assessment: Needs assistance           Standing balance-Leahy Scale: Poor Standing balance comment: pt with posterior bias upon standing             High level balance activites: Direction changes;Head turns;Turns;Backward walking     ADL Overall ADL's : Needs assistance/impaired     Grooming: Wash/dry hands;Wash/dry face;Brushing hair;Oral care;Minimal assistance;Standing       Lower Body Bathing: Sit to/from stand;Minimal assistance       Lower Body Dressing: Sit to/from stand;Moderate assistance   Toilet Transfer: Minimal assistance;Ambulation;Comfort height toilet   Toileting- Clothing Manipulation and Hygiene: Sit to/from stand;Minimal assistance         General ADL Comments: explalined to sister pt will need A at home. Daughter lives behind pt in a trailer  and can A.  Pt and daughter verbalized understanding           Extremity/Trunk Assessment      Lower Extremity Assessment Lower Extremity Assessment: Generalized weakness;RLE deficits/detail;LLE deficits/detail RLE Coordination: decreased gross motor LLE Coordination: decreased gross motor              General Comments  pts cognition seems clear this day      Home Living Family/patient expects to be discharged to:: Private residence Living Arrangements: Alone Available Help at Discharge: Family;Available PRN/intermittently Type of Home: House Home Access: Stairs to enter [employer] of Steps: ramp and then 1 step Entrance Stairs-Rails: None Home Layout: Two level;Laundry or work area in basement Alternate Therapist, sports of Steps: full flight             Home Equipment: Environmental consultant - 2 wheels;Cane - single point;Grab bars - tub/shower   Additional Comments: Family checks on pt multiple times per day.        Prior Functioning/Environment          Comments: Pt ambulates in the home without AD.  He negotiates steps into the basement when family is present - uses the rail.  He performs all BADLs.  Does not drive    Frequency Min 2X/week     Progress Toward Goals  OT Goals(current goals can now be found in the care plan section)  Progress towards OT goals: Progressing toward goals  Acute Rehab OT Goals Patient Stated Goal: To get better  Plan  Discharge plan remains appropriate       End of Session     Activity Tolerance Patient tolerated treatment well   Patient Left in chair;with call bell/phone within reach;with nursing/sitter in room           Time: 1044-1102 OT Time Calculation (min): 18 min  Charges: OT General Charges $OT Visit: 1 Procedure OT Treatments $Self Care/Home Management : 8-22 mins  Rosa Gambale D 11/20/2013, 11:07 AM

## 2013-11-20 NOTE — Progress Notes (Signed)
CARDIAC REHAB PHASE I   PRE:  Rate/Rhythm: 66 SR    BP: sitting 116/63    SaO2: 97 RA  MODE:  Ambulation: 230 ft   POST:  Rate/Rhythm: 71 SR    BP: sitting 114/61     SaO2: 97 RA  Pt much stronger this pm. Able to stand fairly independently with RW in front of him. Used RW for 40 ft but pt sts he doesn't use it much at home. Used cane rest of walk and pt able to manage. Increased stride today, still with narrow stance. Daughter reports this is close to his baseline. Return to recliner, VSS. Assist x2 for assist/supervision. [place], [place], ACSM 11/20/2013 2:19 PM

## 2013-11-20 NOTE — Progress Notes (Signed)
[address]       [place] [address]             [phone]     CARDIOTHORACIC SURGERY PROGRESS NOTE  2 Days Post-Op  S/P Procedure(s) (LRB): TRANSCATHETER AORTIC VALVE REPLACEMENT, TRANSFEMORAL (N/A) INTRAOPERATIVE TRANSESOPHAGEAL ECHOCARDIOGRAM (N/A)  Subjective: Reports having a bad night last night.  States "they tried to lock me up".  Reports mild soreness across lower abdomen.  No SOB.  Speech is very, very slow and labored.  Somewhat anxious, asking for Ativan  Objective: Vital signs in last 24 hours: Temp:  [98.1 F (36.7 C)-99.1 F (37.3 C)] 98.3 F (36.8 C) (07/09 0336) Pulse Rate:  [65-90] 75 (07/09 0336) Cardiac Rhythm:  [-]  Resp:  [17-18] 17 (07/09 0336) BP: (101-144)/(51-71) 141/67 mmHg (07/09 0336) SpO2:  [95 %-100 %] 96 % (07/09 0336) Weight:  [64.1 kg (141 lb 5 oz)] 64.1 kg (141 lb 5 oz) (07/09 1779)  Physical Exam:  Rhythm:   Sinus w/ PAC's  Breath sounds: clear  Heart sounds:  RRR  Incisions:  Clean and dry  Abdomen:  Soft, non-distended, non-tender  Extremities:  Warm, well-perfused    Intake/Output from previous day: 07/08 0701 - 07/09 0700 In: 240 [P.O.:240] Out: 1150 [Urine:1150] Intake/Output this shift:    Lab Results:  Recent Labs  11/19/13 0434 11/20/13 0330  WBC 10.8* 8.0  HGB 10.8* 10.7*  HCT 32.9* 32.8*  PLT 78* 63*   BMET:  Recent Labs  11/17/13 1005  11/18/13 1508 11/19/13 0434  NA 141  < > 138 139  K 4.3  < > 3.7 4.1  CL 104  < > 110 101  CO2 23  --   --  24  GLUCOSE 148*  < > 135* 123*  BUN 20  < > 13 14  CREATININE 0.90  < > 0.80 0.91  CALCIUM 9.0  --   --  8.8  < > = values in this interval not displayed.  CBG (last 3)   Recent Labs  11/18/13 1925 11/19/13 0039 11/19/13 0352  GLUCAP 125* 125* 122*   PT/INR:   Recent Labs  11/18/13 1500  LABPROT 16.9*  INR 1.37    ECHO:   Transthoracic Echocardiography  Patient: Kade, Demicco MR #: [id number] Study Date:  11/19/2013 Gender: M Age: 73 Height: Weight: 64.2 kg BSA: Pt. Status: Room: 2W34C  Meribeth Mattes SONOGRAPHER Melissa Morford, RDCS PERFORMING [facility], Inpatient  cc:  ------------------------------------------------------------------- LV EF: 60% - 65%  ------------------------------------------------------------------- History: PMH: S/P TAVR.  ------------------------------------------------------------------- Study Conclusions  - Technical notes: Limited echo to assess AVR. S/P TAVR 7.7.15. - Left ventricle: The cavity size was normal. Wall thickness was increased in a pattern of moderate LVH. Systolic function was normal. The estimated ejection fraction was in the range of 60% to 65%. Wall motion was normal; there were no regional wall motion abnormalities. - Aortic valve: A bioprosthetic stent valve is well-seated in the aortic position. There appears to be normal leaflet motion. Peak and mean gradients are 22 and 12 mmHg, respectively. There appears to be a small paravalvular leak in the area around the junction of the interatrial and intraventricular septum. - Mitral valve: Calcified annulus.  Impressions:  - Compared to echo in 09/2013, marked improvement in aortic gradient from 157 mmHg and 93 mmHg, to 22 mmHg and 12 mmHg (peak and mean gradients, respectively) after TAVR. Small perivalvular leak noted around the junction of the  interatrial and intraventricular septum.  Transthoracic echocardiography. M-mode, limited 2D, limited spectral Doppler, and color Doppler. Birthdate: Patient birthdate: October 17, 1940. Age: Patient is 73 yr old. Sex: Gender: male. Weight: Weight: 64.2 kg. Weight: 141.2 lb. Blood pressure: 123/59 Patient status: Inpatient. Study date: Study date: 11/19/2013. Study time: 09:39 AM. Location:  ICU/CCU  -------------------------------------------------------------------  ------------------------------------------------------------------- Left ventricle: The cavity size was normal. Wall thickness was increased in a pattern of moderate LVH. Systolic function was normal. The estimated ejection fraction was in the range of 60% to 65%. Wall motion was normal; there were no regional wall motion abnormalities.  ------------------------------------------------------------------- Aortic valve: A bioprosthetic stent valve is well-seated in the aortic position. There appears to be normal leaflet motion. Peak and mean gradients are 22 and 12 mmHg, respectively. There appears to be a small paravalvular leak in the area around the junction of the interatrial and intraventricular septum. Doppler: Mean gradient (S): 12 mm Hg. Peak gradient (S): 25 mm Hg.  ------------------------------------------------------------------- Mitral valve: Calcified annulus. Doppler: There was trivial regurgitation.  ------------------------------------------------------------------- Prepared and Electronically Authenticated by  Lyman Bishop MD 2015-07-08T10:56:07  ------------------------------------------------------------------- Measurements  Aortic valve Value 09/12/2013 Aortic valve peak velocity, S 250 cm/s 620 Aortic valve mean velocity, S 170 cm/s 427 Aortic valve VTI, S 46.4 cm 139 Aortic mean gradient, S 12 mm Hg 85 Aortic peak gradient, S 25 mm Hg 154  Legend: (L) and (H) mark values outside specified reference range.  (N) marks values inside specified reference range.        Assessment/Plan: S/P Procedure(s) (LRB): TRANSCATHETER AORTIC VALVE REPLACEMENT, TRANSFEMORAL (N/A) INTRAOPERATIVE TRANSESOPHAGEAL ECHOCARDIOGRAM (N/A)  Overall stable POD2 Maintaining NSR w/ stable BP F/U ECHO looks good, mild (1+) PVL Post op thrombocytopenia, slightly worse Primary limitation is  Parkinson's Depending upon progress today he might be ready for d/c home tomorrow w/ home PT - he has great family support  Rexene Alberts 11/20/2013 7:50 AM

## 2013-11-20 NOTE — Evaluation (Signed)
Physical Therapy Evaluation Patient Details Name: [name]. MRN: [id number] DOB: 01/31/1941 Today's Date: 11/20/2013   History of Present Illness  This 73 y.o. with h/o Parkinson's disease admitted 11/18/13 for Transcatheter Aortic Valve Replacement - Open Right Transfemoral Approach.   Clinical Impression  Pt will benefit from PT to address deficits below; Pt reports he is feeling better today and his confusion has improved; plan is for HHPT and 24hr support from family; pt is very pleasant and motivated to work with PT; his family is very supportive;     Follow Up Recommendations Home health PT;Supervision/Assistance - 24 hour    Equipment Recommendations  None recommended by PT    Recommendations for Other Services       Precautions / Restrictions Precautions Precautions: Fall Precaution Comments: Pt and family deny history of falls       Mobility  Bed Mobility Overal bed mobility: Needs Assistance Bed Mobility: Supine to Sit     Supine to sit: Min assist;Min guard     General bed mobility comments: incr time and effort to get to EOB, min/guard-facilitation of wt shift  Transfers Overall transfer level: Needs assistance Equipment used: 1 person hand held assist Transfers: Sit to/from Stand Sit to Stand: Min assist         General transfer comment: Pt leans posteriorly - requires facilitation, cues and incr time for forward excursion   Ambulation/Gait Ambulation/Gait assistance: Min assist Ambulation Distance (Feet):  (and 10') Assistive device: 1 person hand held assist (pt holding onto PTs shoulders (facing each other)) Gait Pattern/deviations: Step-through pattern;Decreased step length - right;Decreased step length - left;Trunk flexed;Drifts right/left;Narrow base of support;Shuffle     General Gait Details: pt holding PTs shoulders while PT amb backward to facilitate wt shift, upright posture, and icr step length bil;   Stairs             Wheelchair Mobility    Modified Rankin (Stroke Patients Only)       Balance Overall balance assessment: Needs assistance           Standing balance-Leahy Scale: Poor Standing balance comment: pt with posterior bias upon standing             High level balance activites: Direction changes;Head turns;Turns;Backward walking               Pertinent Vitals/Pain Denies pain    Home Living Family/patient expects to be discharged to:: Private residence Living Arrangements: Alone Available Help at Discharge: Family;Available PRN/intermittently Type of Home: House Home Access: Stairs to enter Entrance Stairs-Rails: None Entrance Stairs-Number of Steps: ramp and then 1 step Home Layout: Two level;Laundry or work area in [place]: Environmental consultant - 2 wheels;Cane - single point;Grab bars - tub/shower Additional Comments: Family checks on pt multiple times per day.      Prior Function           Comments: Pt ambulates in the home without AD.  He negotiates steps into the basement when family is present - uses the rail.  He performs all BADLs.  Does not drive      Hand Dominance        Extremity/Trunk Assessment               Lower Extremity Assessment: Generalized weakness;RLE deficits/detail;LLE deficits/detail         Communication   Communication: Expressive difficulties  Cognition Arousal/Alertness: Awake/alert Behavior During Therapy: WFL for tasks assessed/performed Overall Cognitive Status: Impaired/Different from  baseline Area of Impairment: Memory;Problem solving             Problem Solving: Slow processing;Decreased initiation General Comments: mild confusion, better today per family    General Comments      Exercises        Assessment/Plan    PT Assessment Patient needs continued PT services  PT Diagnosis Difficulty walking   PT Problem List Decreased balance;Decreased mobility;Decreased knowledge of use of  DME;Decreased coordination  PT Treatment Interventions DME instruction;Gait training;Functional mobility training;Therapeutic activities;Therapeutic exercise;Patient/family education;Balance training   PT Goals (Current goals can be found in the Care Plan section) Acute Rehab PT Goals Patient Stated Goal: To get better PT Goal Formulation: With patient Time For Goal Achievement: 11/27/13 Potential to Achieve Goals: Good    Frequency Min 3X/week   Barriers to discharge        Co-evaluation               End of Session   Activity Tolerance: Patient tolerated treatment well Patient left: in chair;with call bell/phone within reach;with family/visitor present Nurse Communication: Mobility status         Time: 0623-7628 PT Time Calculation (min): 18 min   Charges:   PT Evaluation $Initial PT Evaluation Tier I: 1 Procedure PT Treatments $Gait Training: 8-22 mins   PT G Codes:          Yosgar Demirjian 2013/12/02, 11:01 AM

## 2013-11-20 NOTE — Progress Notes (Signed)
    Subjective:  No CP or dyspnea. Felt like he was 'locked up' last night.   Objective:  Vital Signs in the last 24 hours: Temp:  [98.3 F (36.8 C)-99.1 F (37.3 C)] 98.3 F (36.8 C) (07/09 0336) Pulse Rate:  [65-77] 75 (07/09 0336) Resp:  [17-18] 17 (07/09 0336) BP: (101-141)/(51-67) 141/67 mmHg (07/09 0336) SpO2:  [95 %-100 %] 96 % (07/09 0336) Weight:  [64.1 kg (141 lb 5 oz)] 64.1 kg (141 lb 5 oz) (07/09 0653)  Intake/Output from previous day: 07/08 0701 - 07/09 0700 In: 240 [P.O.:240] Out: 1150 [Urine:1150]  Physical Exam: Pt is alert and oriented, NAD, slow deliberate speech HEENT: normal Neck: JVP - normal Lungs: CTA bilaterally CV: RRR with 2/6 systolic and diastolic murmur at the LSB Abd: soft, NT, Positive BS, no hepatomegaly Ext: no C/C/E, distal pulses intact and equal Skin: warm/dry no rash   Lab Results:  Recent Labs  11/19/13 0434 11/20/13 0330  WBC 10.8* 8.0  HGB 10.8* 10.7*  PLT 78* 63*    Recent Labs  11/18/13 1508 11/19/13 0434  NA 138 139  K 3.7 4.1  CL 110 101  CO2  --  24  GLUCOSE 135* 123*  BUN 13 14  CREATININE 0.80 0.91   No results found for this basename: TROPONINI, CK, MB,  in the last 72 hours  Cardiac Studies: 2D Echo: Study Conclusions  - Technical notes: Limited echo to assess AVR. S/P TAVR 7.7.15. - Left ventricle: The cavity size was normal. Wall thickness was increased in a pattern of moderate LVH. Systolic function was normal. The estimated ejection fraction was in the range of 60% to 65%. Wall motion was normal; there were no regional wall motion abnormalities. - Aortic valve: A bioprosthetic stent valve is well-seated in the aortic position. There appears to be normal leaflet motion. Peak and mean gradients are 22 and 12 mmHg, respectively. There appears to be a small paravalvular leak in the area around the junction of the interatrial and intraventricular septum. - Mitral valve: Calcified  annulus.  Impressions:  - Compared to echo in 09/2013, marked improvement in aortic gradient from 157 mmHg and 93 mmHg, to 22 mmHg and 12 mmHg (peak and mean gradients, respectively) after TAVR. Small perivalvular leak noted around the junction of the interatrial and intraventricular septum.  Tele: Sinus rhythm  Assessment/Plan:  1. Severe AS s/p TAVR POD #1  2. Parkinson's Disease  3. Post-op anemia, mild  4. Thrombocytopenia  Reviewed echo images. Pt stable from cardiac perspective with mild PVL on echo. Anticipate d/c home tomorrow with HHPT.  Sherren Mocha, M.D. 11/20/2013, 10:08 AM

## 2013-11-20 NOTE — Anesthesia Postprocedure Evaluation (Signed)
  Anesthesia Post-op Note  Patient: [name].  Procedure(s) Performed: Procedure(s): TRANSCATHETER AORTIC VALVE REPLACEMENT, TRANSFEMORAL (N/A) INTRAOPERATIVE TRANSESOPHAGEAL ECHOCARDIOGRAM (N/A)  Patient Location: PACU and Nursing Unit  Anesthesia Type:General  Level of Consciousness: awake, alert , oriented and patient cooperative  Airway and Oxygen Therapy: Patient Spontanous Breathing  Post-op Pain: none  Post-op Assessment: Post-op Vital signs reviewed, Patient's Cardiovascular Status Stable, Respiratory Function Stable, Patent Airway, No signs of Nausea or vomiting, Adequate PO intake and Pain level controlled  Post-op Vital Signs: Reviewed and stable  Last Vitals:  Filed Vitals:   11/20/13 2007  BP: 130/72  Pulse: 67  Temp: 36.8 C  Resp: 18    Complications: No apparent anesthesia complications

## 2013-11-21 DIAGNOSIS — Z954 Presence of other heart-valve replacement: Secondary | ICD-10-CM

## 2013-11-21 DIAGNOSIS — G20 Parkinson's disease: Secondary | ICD-10-CM

## 2013-11-21 LAB — TYPE AND SCREEN
ABO/RH(D): O POS
ANTIBODY SCREEN: NEGATIVE
UNIT DIVISION: 0
UNIT DIVISION: 0
Unit division: 0
Unit division: 0

## 2013-11-21 MED ORDER — ASPIRIN 81 MG PO TABS: 81.0000 mg | ORAL_TABLET | Freq: Every day | ORAL | Status: DC

## 2013-11-21 MED ORDER — CLOPIDOGREL BISULFATE 75 MG PO TABS: 75.0000 mg | ORAL_TABLET | Freq: Every day | ORAL | Status: DC

## 2013-11-21 MED ORDER — METOPROLOL TARTRATE 25 MG PO TABS
12.5000 mg | ORAL_TABLET | Freq: Two times a day (BID) | ORAL | Status: DC
Start: 2013-11-21 — End: 2014-03-20

## 2013-11-21 NOTE — Progress Notes (Signed)
CARDIAC REHAB PHASE I   PRE:  Rate/Rhythm: 64 SR  BP:  Supine:   Sitting: 117/62  Standing:    SaO2: 96%RA  MODE:  Ambulation: 316 ft   POST:  Rate/Rhythm: 74  BP:  Supine:   Sitting: 125/63  Standing:    SaO2: 99%RA 9169-4503 Pt walked 316 ft on RA with gait belt use and his cane. Tendency to lean backward upon standing. Had to help pt straighten and balance prior to walk. Gets feet close together and becomes unsteady. Discussed importance of family walking with him since he is unsteady.   Graylon Good, RN BSN  11/21/2013 9:43 AM

## 2013-11-21 NOTE — Progress Notes (Signed)
SUBJECTIVE: Only c/o soreness above right knee. No SOB. No chest pain.   BP 126/68  Pulse 56  Temp(Src) 97.7 F (36.5 C) (Oral)  Resp 18  Ht 5\' 5"  (1.651 m)  Wt 140 lb 14.4 oz (63.912 kg)  BMI 23.45 kg/m2  SpO2 98%  Intake/Output Summary (Last 24 hours) at 11/21/13 0705 Last data filed at 11/21/13 0500  Gross per 24 hour  Intake    600 ml  Output    700 ml  Net   -100 ml    PHYSICAL EXAM General: Well developed, well nourished, in no acute distress. Alert and oriented x 3.  Psych:  Good affect, responds appropriately Neck: No JVD. No masses noted.  Lungs: Clear bilaterally with no wheezes or rhonci noted.  Heart: Regular, brady with slight murmur noted. Abdomen: Bowel sounds are present. Soft, non-tender.  Extremities: No lower extremity edema.   LABS: Basic Metabolic Panel:  Recent Labs  11/18/13 1508 11/19/13 0434  NA 138 139  K 3.7 4.1  CL 110 101  CO2  --  24  GLUCOSE 135* 123*  BUN 13 14  CREATININE 0.80 0.91  CALCIUM  --  8.8  MG  --  1.7   CBC:  Recent Labs  11/19/13 0434 11/20/13 0330  WBC 10.8* 8.0  HGB 10.8* 10.7*  HCT 32.9* 32.8*  MCV 87.3 87.7  PLT 78* 63*   Current Meds: . acetaminophen  1,000 mg Oral 4 times per day   Or  . acetaminophen (TYLENOL) oral liquid 160 mg/5 mL  1,000 mg Per Tube 4 times per day  . acetaminophen (TYLENOL) oral liquid 160 mg/5 mL  650 mg Per Tube Once   Or  . acetaminophen  650 mg Rectal Once  . aspirin  81 mg Oral Daily  . carbidopa-levodopa  1.5 tablet Oral 3 times per day  . cholecalciferol  1,000 Units Oral QHS  . clonazePAM  1.5 mg Oral QHS  . clopidogrel  75 mg Oral Q breakfast  . LORazepam  0.5 mg Oral Daily  . metoprolol tartrate  12.5 mg Oral BID   Or  . metoprolol tartrate  12.5 mg Per Tube BID  . pantoprazole  40 mg Oral Daily  . sertraline  50 mg Oral Daily   Echo 11/19/13:  Technical notes: Limited echo to assess AVR. S/P TAVR 7.7.15. - Left ventricle: The cavity size was normal.  Wall thickness was increased in a pattern of moderate LVH. Systolic function was normal. The estimated ejection fraction was in the range of 60% to 65%. Wall motion was normal; there were no regional wall motion abnormalities. - Aortic valve: A bioprosthetic stent valve is well-seated in the aortic position. There appears to be normal leaflet motion. Peak and mean gradients are 22 and 12 mmHg, respectively. There appears to be a small paravalvular leak in the area around the junction of the interatrial and intraventricular septum. - Mitral valve: Calcified annulus. Impressions: - Compared to echo in 09/2013, marked improvement in aortic gradient from 157 mmHg and 93 mmHg, to 22 mmHg and 12 mmHg (peak and mean gradients, respectively) after TAVR. Small perivalvular leak noted around the junction of the interatrial and intraventricular septum.  ASSESSMENT AND PLAN:  1. Severe AS: s/p TAVR POD #3. Stable. F/U echo with mild PVL.    2. Parkinson's Disease   3. Post-op anemia  4. Thrombocytopenia  5. Dispo: Discharge home today. He will need HHPT/OT. He will be contacted  for f/u by our TAVR coordinator.     [name]  7/10/20157:05 AM

## 2013-11-21 NOTE — Discharge Summary (Signed)
See full note this am. cdm 

## 2013-11-21 NOTE — Progress Notes (Signed)
Discharge instructions given. Pt's daughter verbalized understanding and all questions were answer.

## 2013-11-21 NOTE — Progress Notes (Addendum)
      [address]       [place] [address]             [phone]      3 Days Post-Op Procedure(s) (LRB): TRANSCATHETER AORTIC VALVE REPLACEMENT, TRANSFEMORAL (N/A) INTRAOPERATIVE TRANSESOPHAGEAL ECHOCARDIOGRAM (N/A)  Subjective:  Mr. Delcarlo has no complaints this morning.  States he had an okay night after 10 pm.  Objective: Vital signs in last 24 hours: Temp:  [97.7 F (36.5 C)-98.5 F (36.9 C)] 97.7 F (36.5 C) (07/10 0537) Pulse Rate:  [56-72] 56 (07/10 0537) Cardiac Rhythm:  [-] Normal sinus rhythm (07/09 1940) Resp:  [18] 18 (07/10 0537) BP: (119-130)/(61-72) 126/68 mmHg (07/10 0537) SpO2:  [96 %-98 %] 98 % (07/10 0537) Weight:  [140 lb 14.4 oz (63.912 kg)] 140 lb 14.4 oz (63.912 kg) (07/10 0537)  Intake/Output from previous day: 07/09 0701 - 07/10 0700 In: 600 [P.O.:600] Out: 700 [Urine:700]  General appearance: alert, cooperative and no distress Heart: regular rate and rhythm Lungs: clear to auscultation bilaterally Wound: clean and dry  Lab Results:  Recent Labs  11/19/13 0434 11/20/13 0330  WBC 10.8* 8.0  HGB 10.8* 10.7*  HCT 32.9* 32.8*  PLT 78* 63*   BMET:  Recent Labs  11/18/13 1508 11/19/13 0434  NA 138 139  K 3.7 4.1  CL 110 101  CO2  --  24  GLUCOSE 135* 123*  BUN 13 14  CREATININE 0.80 0.91  CALCIUM  --  8.8    PT/INR:  Recent Labs  11/18/13 1500  LABPROT 16.9*  INR 1.37   ABG    Component Value Date/Time   PHART 7.361 11/18/2013 1511   HCO3 27.0* 11/18/2013 1511   TCO2 28 11/18/2013 1511   ACIDBASEDEF 1.0 10/09/2013 0919   O2SAT 98.0 11/18/2013 1511   CBG (last 3)   Recent Labs  11/18/13 1925 11/19/13 0039 11/19/13 0352  GLUCAP 125* 125* 122*    Assessment/Plan: S/P Procedure(s) (LRB): TRANSCATHETER AORTIC VALVE REPLACEMENT, TRANSFEMORAL (N/A) INTRAOPERATIVE TRANSESOPHAGEAL ECHOCARDIOGRAM (N/A)  1. CV- remains hemodynamically stable 2. Parkinson's Disease 3. Dispo- care per Cardiology, home  health arranged   LOS: 3 days    BARRETT, ERIN 11/21/2013  I have seen and examined the patient and agree with the assessment and plan as outlined.  OWEN,CLARENCE H 11/21/2013 1:05 PM

## 2013-11-21 NOTE — Discharge Summary (Signed)
CARDIOLOGY DISCHARGE SUMMARY   Patient ID: [name]. MRN: [id number] DOB/AGE: 1940/07/19 73 y.o.  Admit date: 11/18/2013 Discharge date: 11/21/2013  PCP: Horton Finer, MD Primary Cardiologist: Dr. Burt Knack  Primary Discharge Diagnosis:   Severe aortic stenosis Secondary Discharge Diagnosis:    S/P TAVR (transcatheter aortic valve replacement)   Parkinson's disease   Aortic valve disorders  Consults: TCTS  Procedures:  Transcatheter Aortic Valve Replacement - Open Right Transfemoral Approach Edwards Sapien XT (size 26 mm, model # 9300TFX, serial # [id number]) Transesophageal echocardiogram 2-D echocardiogram  Hospital Course: [name]. is a 73 y.o. male with no previous cardiac issues. He was evaluated by Dr. Marlou Porch in May 2015 and referred to Dr. Burt Knack for severe aortic stenosis. He was found to have severe aortic stenosis by right and left heart catheterization and scheduled for surgery. He came to the hospital for the procedure on 07/07.  Procedure results are below. He had transcatheter aortic valve replacement with an intraoperative transesophageal echocardiogram and tolerated the procedure well. A 2-D echocardiogram on 07/08 was performed, results below. It showed a peak gradient of 21 mm Hg. He was maintaining sinus rhythm and his blood pressure was stable. He had mild thrombocytopenia and anemia, mild.   He continued to improve steadily, and was seen by cardiac rehabilitation to help assist in increasing his ambulation. On 07/10, he was seen by Dr. Angelena Form and by TCTS. He will need home health PT and OT but is otherwise doing well and is considered stable for discharge, to follow up as an outpatient.  Labs:  Lab Results  Component Value Date   WBC 8.0 11/20/2013   HGB 10.7* 11/20/2013   HCT 32.8* 11/20/2013   MCV 87.7 11/20/2013   PLT 63* 11/20/2013    Recent Labs Lab 11/17/13 1005  11/19/13 0434  NA 141  < > 139  K 4.3  < > 4.1  CL 104  <  > 101  CO2 23  --  24  BUN 20  < > 14  CREATININE 0.90  < > 0.91  CALCIUM 9.0  --  8.8  PROT 6.7  --   --   BILITOT 0.4  --   --   ALKPHOS 99  --   --   ALT 14  --   --   AST 20  --   --   GLUCOSE 148*  < > 123*  < > = values in this interval not displayed.   Recent Labs  11/18/13 1500  INR 1.37      Radiology: Dg Chest 2 View 11/17/2013   CLINICAL DATA:  Transcatheter aortic valve replacement. Aortic valve disorders.  EXAM: CHEST  2 VIEW  COMPARISON:  CT chest 10/20/2013.  FINDINGS: The heart size is normal. The lungs are clear. Minimal atherosclerotic calcifications are present at the aortic arch. Fusion of anterior osteophytes is noted through the mid thoracic spine there is slight exaggerated kyphosis with anterior wedge deformities at T12 and L1.  IMPRESSION: 1. No acute cardiopulmonary disease. 2. Exaggerated thoracic kyphosis with remote fractures at T12 and L1.   Electronically Signed   By: Lawrence Santiago M.D.   On: 11/17/2013 10:46   Dg Chest Portable 1 View In Am 11/19/2013   CLINICAL DATA:  Aortic valve replacement  EXAM: PORTABLE CHEST - 1 VIEW  COMPARISON:  11/18/2013  FINDINGS: Swan-Ganz catheter removed.  No pneumothorax.  Decreased lung volume and increase in bibasilar atelectasis compared  with the prior study. Pulmonary vascular congestion has progressed likely due to hypoventilation. Small left effusion.  IMPRESSION: Increase in bibasilar atelectasis secondary to hypoventilation.   Electronically Signed   By: Franchot Gallo M.D.   On: 11/19/2013 08:15   Dg Chest Portable 1 View 11/18/2013   CLINICAL DATA:  Post TAVR  EXAM: PORTABLE CHEST - 1 VIEW  COMPARISON:  11/17/2013  FINDINGS: TAVR device noted in place. Swan-Ganz catheter tip is in the main pulmonary artery. No pneumothorax. Left base atelectasis. Right lung is clear. No effusions or acute bony abnormality. Heart is normal size.  IMPRESSION: Post TAVR.  Left base atelectasis.  No pneumothorax.   Electronically Signed    By: Rolm Baptise M.D.   On: 11/18/2013 15:25    Op note: 11/18/2013 PERIPHERAL ACCESS:  Using the modified Seldinger technique, femoral arterial and venous access was obtained with placement of 6 Fr sheaths on the left side. A pigtail diagnostic catheter was passed through the left femoral arterial sheath under fluoroscopic guidance into the aortic root. A temporary transvenous pacemaker catheter was passed through the left femoral venous sheath under fluoroscopic guidance into the right ventricle. In addition, the right radial artery was accessed with a 5/6 Fr Slender Sheath for potential access to the left coronary in case of coronary obstruction. The pacemaker was tested to ensure stable lead placement and pacemaker capture. Aortic root angiography was performed in order to determine the optimal angiographic angle for valve deployment.  TRANSFEMORAL ACCESS:  A right femoral arterial cutdown was performed by Dr Roxy Manns. Please see his separate operative note for details. The patient was heparinized systemically and ACT verified > 250 seconds.  A 20 Fr transfemoral sheath was introduced into the right femoral artery after progressively dilating over an Amplatz superstiff wire. An AL-1 catheter was used to direct a straight-tip exchange length wire across the native aortic valve into the left ventricle. This was exchanged out for a pigtail catheter and position was confirmed in the LV apex. Simultaneous LV and Ao pressures were recorded. The pigtail catheter was then exchanged for an Amplatz Extra-stiff wire in the LV apex. At that point, BAV was performed using a 25 mm valvuloplasty balloon. We performed an aortic root angiogram during BAV to evaluate valve size. Once optimal position was achieved, BAV was done under rapid ventricular pacing at 180 bpm. The patient recovered well hemodynamically. The 25 mm balloon appeared to completely seal the aortic valve annulus and we elected to prep a 26 mm Sapien Valve.  There was no evidence of left main obstruction during BAV.  TRANSCATHETER HEART VALVE DEPLOYMENT:  An Edwards Sapien XT THV (size 26 mm) was prepared and crimped per manufacturer's guidelines, and the proper orientation of the valve is confirmed on the [employer] delivery system. The valve was advanced through the introducer sheath using normal technique until in an appropriate position in the abdominal aorta beyond the sheath tip. The balloon was then retracted and using the fine-tuning wheel was centered on the valve. The valve was then advanced across the aortic arch using appropriate flexion of the catheter. The valve was carefully positioned across the aortic valve annulus. The Novaflex catheter was retracted using normal technique. Once final position of the valve has been confirmed by angiographic assessment, the valve is deployed while temporarily holding ventilation and during rapid ventricular pacing to maintain systolic blood pressure < 50 mmHg and pulse pressure < 10 mmHg. The balloon inflation is held for >3 seconds  after reaching full deployment volume. Once the balloon has fully deflated the balloon is retracted into the ascending aorta and valve function is assessed using TEE. There is felt to be 2+ paravalvular leak and no central aortic insufficiency. The patient's hemodynamic recovery following valve deployment is good. Because of the aortic insufficiency, an additional cc of fluid was inserted into the Atrion device and a post-dilatation was performed under rapid pacing. The deployment balloon and guidewire are both removed. Echo demostrated acceptable post-procedural gradients, stable mitral valve function, and 2+ AI. Aortography confirmed moderate AI.  PROCEDURE COMPLETION:  The sheath was then removed and arteriotomy repaired by Dr Roxy Manns. Please see his separate report for details. Distal abdominal aortography was performed to evaluate for any arterial injury related to the  procedure. There was no evidence dissection, perforation, or other vascular injury in the abdominal aorta, iliac artery, or femoral artery. Protamine was administered once femoral arterial repair was complete. The temporary pacemaker, pigtail catheters and femoral sheaths were removed with manual pressure used for hemostasis.  The patient tolerated the procedure well and is transported to the surgical intensive care in stable condition. There were no immediate intraoperative complications. All sponge instrument and needle counts are verified correct at completion of the operation.  No blood products were administered during the operation.  The patient received a total of 116 mL of intravenous contrast during the procedure.   EKG: 11/19/2013 Sinus rhythm Vent. rate 83 BPM PR interval 156 ms QRS duration 104 ms QT/QTc 382/448 ms P-R-T axes 71 39 15  Echo: 11/19/2013 Conclusions - Technical notes: Limited echo to assess AVR. S/P TAVR 7.7.15. - Left ventricle: The cavity size was normal. Wall thickness was increased in a pattern of moderate LVH. Systolic function was normal. The estimated ejection fraction was in the range of 60% to 65%. Wall motion was normal; there were no regional wall motion abnormalities. - Aortic valve: A bioprosthetic stent valve is well-seated in the aortic position. There appears to be normal leaflet motion. Peak and mean gradients are 22 and 12 mmHg, respectively. There appears to be a small paravalvular leak in the area around the junction of the interatrial and intraventricular septum. - Mitral valve: Calcified annulus. Impressions: - Compared to echo in 09/2013, marked improvement in aortic gradient from 157 mmHg and 93 mmHg, to 22 mmHg and 12 mmHg (peak and mean gradients, respectively) after TAVR. Small perivalvular leak noted around the junction of the interatrial and intraventricular septum.   FOLLOW UP PLANS AND APPOINTMENTS Allergies  Allergen  Reactions  . Vicodin [Hydrocodone-Acetaminophen] Other (See Comments)    "Mental reaction"     Medication List    STOP taking these medications       VANQUISH PO      TAKE these medications       acetaminophen 500 MG tablet  Commonly known as:  TYLENOL  Take 1,000 mg by mouth 2 (two) times daily as needed (pain). For pain     aspirin 81 MG tablet  Take 1 tablet (81 mg total) by mouth daily.     butalbital-acetaminophen-caffeine 50-500-40 MG per tablet  Commonly known as:  ESGIC PLUS  Take 1 tablet by mouth every 4 (four) hours as needed (headaches).     carbidopa-levodopa 25-100 MG per tablet  Commonly known as:  SINEMET IR  Take 1.5 tablets by mouth See admin instructions. Take 1 and 1/2 tablets at 10am, 2pm and 6pm.     cholecalciferol 1000 UNITS tablet  Commonly known as:  VITAMIN D  Take 1,000 Units by mouth at bedtime.     clonazePAM 1 MG tablet  Commonly known as:  KLONOPIN  Take 1.5 mg by mouth at bedtime.     clopidogrel 75 MG tablet  Commonly known as:  PLAVIX  Take 1 tablet (75 mg total) by mouth daily with breakfast.     LORazepam 0.5 MG tablet  Commonly known as:  ATIVAN  Take 0.5 mg by mouth daily.     metoprolol tartrate 25 MG tablet  Commonly known as:  LOPRESSOR  Take 0.5 tablets (12.5 mg total) by mouth 2 (two) times daily.     omeprazole 20 MG capsule  Commonly known as:  PRILOSEC  Take 20 mg by mouth at bedtime.     sertraline 50 MG tablet  Commonly known as:  ZOLOFT  Take 50 mg by mouth daily.     traMADol 50 MG tablet  Commonly known as:  ULTRAM  Take 50 mg by mouth daily as needed (pain).        Discharge Instructions   Diet - low sodium heart healthy    Complete by:  As directed      Increase activity slowly    Complete by:  As directed           Follow-up Information   Follow up with [facility] TAVR. (The office will call)       BRING ALL MEDICATIONS WITH YOU TO FOLLOW UP APPOINTMENTS  Time spent with patient to  include physician time: 42 min Signed: Rosaria Ferries, PA-C 11/21/2013, 11:30 AM Co-Sign MD

## 2013-11-22 DIAGNOSIS — G20 Parkinson's disease: Secondary | ICD-10-CM | POA: Diagnosis not present

## 2013-11-22 DIAGNOSIS — Z48812 Encounter for surgical aftercare following surgery on the circulatory system: Secondary | ICD-10-CM | POA: Diagnosis not present

## 2013-11-24 DIAGNOSIS — Z48812 Encounter for surgical aftercare following surgery on the circulatory system: Secondary | ICD-10-CM | POA: Diagnosis not present

## 2013-11-24 DIAGNOSIS — G20 Parkinson's disease: Secondary | ICD-10-CM | POA: Diagnosis not present

## 2013-11-25 ENCOUNTER — Encounter (HOSPITAL_COMMUNITY): Payer: Self-pay | Admitting: Cardiovascular Disease

## 2013-11-26 DIAGNOSIS — G20 Parkinson's disease: Secondary | ICD-10-CM | POA: Diagnosis not present

## 2013-11-26 DIAGNOSIS — Z48812 Encounter for surgical aftercare following surgery on the circulatory system: Secondary | ICD-10-CM | POA: Diagnosis not present

## 2013-11-28 DIAGNOSIS — Z48812 Encounter for surgical aftercare following surgery on the circulatory system: Secondary | ICD-10-CM | POA: Diagnosis not present

## 2013-11-28 DIAGNOSIS — G20 Parkinson's disease: Secondary | ICD-10-CM | POA: Diagnosis not present

## 2013-11-29 DIAGNOSIS — G20 Parkinson's disease: Secondary | ICD-10-CM | POA: Diagnosis not present

## 2013-11-29 DIAGNOSIS — Z48812 Encounter for surgical aftercare following surgery on the circulatory system: Secondary | ICD-10-CM | POA: Diagnosis not present

## 2013-12-01 ENCOUNTER — Other Ambulatory Visit: Payer: Self-pay | Admitting: *Deleted

## 2013-12-01 DIAGNOSIS — G20 Parkinson's disease: Secondary | ICD-10-CM | POA: Diagnosis not present

## 2013-12-01 DIAGNOSIS — I359 Nonrheumatic aortic valve disorder, unspecified: Secondary | ICD-10-CM

## 2013-12-01 DIAGNOSIS — Z48812 Encounter for surgical aftercare following surgery on the circulatory system: Secondary | ICD-10-CM | POA: Diagnosis not present

## 2013-12-02 DIAGNOSIS — Z48812 Encounter for surgical aftercare following surgery on the circulatory system: Secondary | ICD-10-CM | POA: Diagnosis not present

## 2013-12-02 DIAGNOSIS — G20 Parkinson's disease: Secondary | ICD-10-CM | POA: Diagnosis not present

## 2013-12-03 ENCOUNTER — Encounter: Payer: Self-pay | Admitting: Physician Assistant

## 2013-12-03 ENCOUNTER — Ambulatory Visit (INDEPENDENT_AMBULATORY_CARE_PROVIDER_SITE_OTHER): Payer: Medicare Other | Admitting: Physician Assistant

## 2013-12-03 VITALS — BP 100/70 | HR 61 | Ht 65.0 in | Wt 145.0 lb

## 2013-12-03 DIAGNOSIS — I359 Nonrheumatic aortic valve disorder, unspecified: Secondary | ICD-10-CM | POA: Diagnosis not present

## 2013-12-03 DIAGNOSIS — Z952 Presence of prosthetic heart valve: Secondary | ICD-10-CM

## 2013-12-03 DIAGNOSIS — I350 Nonrheumatic aortic (valve) stenosis: Secondary | ICD-10-CM

## 2013-12-03 DIAGNOSIS — G20 Parkinson's disease: Secondary | ICD-10-CM | POA: Diagnosis not present

## 2013-12-03 DIAGNOSIS — D696 Thrombocytopenia, unspecified: Secondary | ICD-10-CM

## 2013-12-03 DIAGNOSIS — Z954 Presence of other heart-valve replacement: Secondary | ICD-10-CM | POA: Diagnosis not present

## 2013-12-03 DIAGNOSIS — M25569 Pain in unspecified knee: Secondary | ICD-10-CM

## 2013-12-03 DIAGNOSIS — Z48812 Encounter for surgical aftercare following surgery on the circulatory system: Secondary | ICD-10-CM | POA: Diagnosis not present

## 2013-12-03 DIAGNOSIS — M25561 Pain in right knee: Secondary | ICD-10-CM

## 2013-12-03 LAB — CBC
HCT: 36.4 % — ABNORMAL LOW (ref 39.0–52.0)
Hemoglobin: 11.9 g/dL — ABNORMAL LOW (ref 13.0–17.0)
MCHC: 32.7 g/dL (ref 30.0–36.0)
MCV: 89.2 fl (ref 78.0–100.0)
Platelets: 128 10*3/uL — ABNORMAL LOW (ref 150.0–400.0)
RBC: 4.08 Mil/uL — ABNORMAL LOW (ref 4.22–5.81)
RDW: 14.2 % (ref 11.5–15.5)
WBC: 6.9 10*3/uL (ref 4.0–10.5)

## 2013-12-03 NOTE — Progress Notes (Signed)
Cardiology Office Note    Date:  12/03/2013   ID:  [name], DOB Jan 04, 1941, MRN [id number]  PCP:  Horton Finer, MD  Cardiologist:  Dr. Sherren Mocha      History of Present Illness: [name]. is a 73 y.o. male with a hx of aortic stenosis, Parkinson's Disease.  Recent echo demonstrated mean gradient of 93 mmHg.  He has recently become symptomatic and was seen by Dr. Burt Knack.  He was felt to be a reasonable candidate for TAVR.  He was admitted 7/7-7/10.  He underwent TAVR via a R transfemoral approach.  Post op course was uneventful. He remained in NSR.    He is here with his daughter.  He is doing fairly well.  Denies chest pain, dyspnea, syncope, orthopnea, PND, edema.  He is only complaint is a burning type of pain around his R knee (anterior and medial).  It is hard to describe.  It seems to be more noticeable if he rubs his knee.  It is no worse.  No rash.  No swelling.  No injury.  No warmth.    Studies:  - LHC (09/29/13):  LAD 40%, no CAD in CFX or RCA.    - Echo (09/12/13):  EF 65-70%; critical AS (mean 93 mmHg)  - Echo (11/19/13):  Mod LVH, EF 60-65%, no RWMA, TAVR ok (peak and mean 22 and 12 mmHg), small perivalvular leak at interatrial and interventricular septum, MAC   Recent Labs: 11/17/2013: ALT 14  11/19/2013: Creatinine 0.91; Potassium 4.1  11/20/2013: Hemoglobin 10.7*   Wt Readings from Last 3 Encounters:  12/03/13 145 lb (65.772 kg)  11/21/13 140 lb 14.4 oz (63.912 kg)  11/21/13 140 lb 14.4 oz (63.912 kg)     Past Medical History  Diagnosis Date  . Kidney stones   . Parkinson disease   . GERD (gastroesophageal reflux disease)   . Aortic stenosis   . Anxiety   . Headache(784.0)   . TIA (transient ischemic attack) 02/08/2007  . Parkinson's disease (tremor, stiffness, slow motion, unstable posture) 12/11/2006  . PONV (postoperative nausea and vomiting)   . Heart murmur   . Hernia, inguinal, bilateral, recurrent   . Restless leg  syndrome   . S/P TAVR (transcatheter aortic valve replacement) 11/18/2013    26 mm Edwards Sapien XT transcatheter heart valve placed via open right transfemoral approach    Current Outpatient Prescriptions  Medication Sig Dispense Refill  . acetaminophen (TYLENOL) 500 MG tablet Take 1,000 mg by mouth 2 (two) times daily as needed (pain). For pain      . aspirin 81 MG tablet Take 1 tablet (81 mg total) by mouth daily.      . butalbital-acetaminophen-caffeine (ESGIC PLUS) 50-500-40 MG per tablet Take 1 tablet by mouth every 4 (four) hours as needed (headaches).       . carbidopa-levodopa (SINEMET IR) 25-100 MG per tablet Take 1.5 tablets by mouth See admin instructions. Take 1 and 1/2 tablets at 10am, 2pm and 6pm.      . cholecalciferol (VITAMIN D) 1000 UNITS tablet Take 1,000 Units by mouth at bedtime.       . clonazePAM (KLONOPIN) 1 MG tablet Take 1.5 mg by mouth at bedtime.       . clopidogrel (PLAVIX) 75 MG tablet Take 1 tablet (75 mg total) by mouth daily with breakfast.  30 tablet  6  . LORazepam (ATIVAN) 0.5 MG tablet Take 0.5 mg by mouth daily.       Marland Kitchen  metoprolol tartrate (LOPRESSOR) 25 MG tablet Take 0.5 tablets (12.5 mg total) by mouth 2 (two) times daily.  60 tablet  6  . omeprazole (PRILOSEC) 20 MG capsule Take 20 mg by mouth at bedtime.       . sertraline (ZOLOFT) 50 MG tablet Take 50 mg by mouth daily.       . traMADol (ULTRAM) 50 MG tablet Take 50 mg by mouth daily as needed (pain).       . VESICARE 5 MG tablet BLADDER CONTROL       No current facility-administered medications for this visit.    Allergies:   Vicodin   Social History:  The patient  reports that he has never smoked. He has never used smokeless tobacco. He reports that he does not drink alcohol or use illicit drugs.   Family History:  The patient's family history includes Anemia in his daughter; Asthma in his daughter; Cancer in his father and son; Diabetes in his daughter; Hypertension in his daughter; Kidney  disease in his daughter.   ROS:  Please see the history of present illness.   No fever.    All other systems reviewed and negative.   PHYSICAL EXAM: VS:  BP 100/70  Pulse 61  Ht 5\' 5"  (1.651 m)  Wt 145 lb (65.772 kg)  BMI 24.13 kg/m2 Well nourished, well developed, in no acute distress HEENT: normal Neck: no JVD Cardiac:  normal S1, S2; RRR; 2/6 systolic murmur at RUSB, 1/6 diastolic murmur LLSB Lungs:  clear to auscultation bilaterally, no wheezing, rhonchi or rales Abd: soft, nontender, no hepatomegaly Ext: no edemaR groin with small hematoma, non-tender, no bruit, non-pulsatile. Skin: warm and dry Neuro:  CNs 2-12 intact, no focal abnormalities noted  EKG:  NSR, HR 61, no ST changes     ASSESSMENT AND PLAN:  Severe aortic stenosis S/P TAVR (transcatheter aortic valve replacement) Doing well s/p TAVR.  R groin looks stable.  F/u echo and f/u with Dr. Roxy Manns is pending.  Continue ASA, Plavix, metoprolol.    Thrombocytopenia Repeat CBC today.  Right knee pain  His pain got worse when I examined his groin.  I wonder if he had some surface nerve injury or femoral nerve irritation that is causing his symptoms.  No other abnormalities noted.  Reviewed with Dr. Lauree Chandler.  He agreed it is likely from femoral nerve irritation.  Reassurance.  He can use Tylenol as needed.  Disposition:  F/u as planned with TAVR clinic.  Signed, Versie Starks, MHS 12/03/2013 11:39 AM    [facility] [place], [place],   [address] Phone: [phone]; Fax: [phone]

## 2013-12-03 NOTE — Patient Instructions (Addendum)
Your physician recommends that you continue on your current medications as directed. Please refer to the Current Medication list given to you today.  Your physician recommends that you go to the lab today for a CBC

## 2013-12-04 ENCOUNTER — Telehealth: Payer: Self-pay | Admitting: *Deleted

## 2013-12-04 NOTE — Telephone Encounter (Signed)
pt notified about lab results and asked for me to call his daughter [name] and let he know as well. [name] has been notified as well of lab results with verbal understanding

## 2013-12-05 DIAGNOSIS — G20 Parkinson's disease: Secondary | ICD-10-CM | POA: Diagnosis not present

## 2013-12-05 DIAGNOSIS — Z48812 Encounter for surgical aftercare following surgery on the circulatory system: Secondary | ICD-10-CM | POA: Diagnosis not present

## 2013-12-08 DIAGNOSIS — G20 Parkinson's disease: Secondary | ICD-10-CM | POA: Diagnosis not present

## 2013-12-08 DIAGNOSIS — Z48812 Encounter for surgical aftercare following surgery on the circulatory system: Secondary | ICD-10-CM | POA: Diagnosis not present

## 2013-12-10 DIAGNOSIS — Z48812 Encounter for surgical aftercare following surgery on the circulatory system: Secondary | ICD-10-CM | POA: Diagnosis not present

## 2013-12-10 DIAGNOSIS — G20 Parkinson's disease: Secondary | ICD-10-CM | POA: Diagnosis not present

## 2013-12-12 ENCOUNTER — Ambulatory Visit (HOSPITAL_COMMUNITY)
Admission: RE | Admit: 2013-12-12 | Discharge: 2013-12-12 | Disposition: A | Discharge status: Home / Self Care | Payer: Medicare Other | Source: Ambulatory Visit | Attending: Internal Medicine | Admitting: Internal Medicine

## 2013-12-12 DIAGNOSIS — I369 Nonrheumatic tricuspid valve disorder, unspecified: Secondary | ICD-10-CM | POA: Diagnosis not present

## 2013-12-12 DIAGNOSIS — I359 Nonrheumatic aortic valve disorder, unspecified: Secondary | ICD-10-CM | POA: Diagnosis not present

## 2013-12-12 NOTE — Progress Notes (Signed)
  Echocardiogram 2D Echocardiogram has been performed.  Donata Clay 12/12/2013, 2:01 PM

## 2013-12-15 ENCOUNTER — Ambulatory Visit (INDEPENDENT_AMBULATORY_CARE_PROVIDER_SITE_OTHER): Payer: Medicare Other | Admitting: Thoracic Surgery (Cardiothoracic Vascular Surgery)

## 2013-12-15 ENCOUNTER — Encounter: Payer: Self-pay | Admitting: Thoracic Surgery (Cardiothoracic Vascular Surgery)

## 2013-12-15 VITALS — BP 107/68 | HR 61 | Ht 65.0 in | Wt 145.0 lb

## 2013-12-15 DIAGNOSIS — I359 Nonrheumatic aortic valve disorder, unspecified: Secondary | ICD-10-CM

## 2013-12-15 DIAGNOSIS — I350 Nonrheumatic aortic (valve) stenosis: Secondary | ICD-10-CM

## 2013-12-15 DIAGNOSIS — Z952 Presence of prosthetic heart valve: Secondary | ICD-10-CM

## 2013-12-15 DIAGNOSIS — Z954 Presence of other heart-valve replacement: Secondary | ICD-10-CM | POA: Diagnosis not present

## 2013-12-15 NOTE — Patient Instructions (Signed)
Continue Plavix for 6 months from the time of surgery  Schedule routine follow up appointment with Dr Marlou Porch within 3-4 months.   Endocarditis is a potentially serious infection of heart valves or inside lining of the heart.  It occurs more commonly in patients with diseased heart valves (such as patient's with aortic or mitral valve disease) and in patients who have undergone heart valve repair or replacement.  Certain surgical and dental procedures may put you at risk, such as dental cleaning, other dental procedures, or any surgery involving the respiratory, urinary, gastrointestinal tract, gallbladder or prostate gland.   To minimize your chances for develooping endocarditis, maintain good oral health and seek prompt medical attention for any infections involving the mouth, teeth, gums, skin or urinary tract.  Always notify your doctor or dentist about your underlying heart valve condition before having any invasive procedures. You will need to take antibiotics before certain procedures.

## 2013-12-15 NOTE — Progress Notes (Signed)
[address]       [place] [address]             [phone]     CARDIOTHORACIC SURGERY OFFICE NOTE  Referring Provider is Candee Furbish, MD PCP is Horton Finer, MD   HPI:  Patient returns for routine follow up s/p transcatheter aortic valve replacement using and Edwards Sapien XT transcatheter heart valve placed via open right transfemoral approach on 11/18/2013.  Prior to surgery he was severely limited by Parkinson's disease. His surgical procedure and postoperative recovery was uncomplicated, although he was noted to have a moderate (2+) paravalvular leak at the time of his procedure and on repeat echocardiogram performed on the first postoperative day. He was discharged home with his daughter on the third postoperative day, and he has been seen in followup by Richardson Dopp at [facility] on 12/03/2013. At that time he was doing well although he was complaining of some burning pain along the anterior and medial aspect of his right knee. This pain was reproduced with palpation of the right groin, and therefore likely represents pain related to his femoral incision for access. The pain is mild and does not limited him, and over the past 2 weeks it has improved considerably.  The patient returns to the office today with his daughter. Both he and his daughter state that he is essentially back to his baseline in terms of mobility and exercise tolerance. His daughter states that this improvement in progress has been slow but overall he has had no significant problems.   Current Outpatient Prescriptions  Medication Sig Dispense Refill  . acetaminophen (TYLENOL) 500 MG tablet Take 1,000 mg by mouth 2 (two) times daily as needed (pain). For pain      . aspirin 81 MG tablet Take 1 tablet (81 mg total) by mouth daily.      . butalbital-acetaminophen-caffeine (ESGIC PLUS) 50-500-40 MG per tablet Take 1 tablet by mouth every 4 (four) hours as needed (headaches).         . carbidopa-levodopa (SINEMET IR) 25-100 MG per tablet Take 1.5 tablets by mouth See admin instructions. Take 1 and 1/2 tablets at 10am, 2pm and 6pm.      . cholecalciferol (VITAMIN D) 1000 UNITS tablet Take 1,000 Units by mouth at bedtime.       . clonazePAM (KLONOPIN) 1 MG tablet Take 1.5 mg by mouth at bedtime.       . clopidogrel (PLAVIX) 75 MG tablet Take 1 tablet (75 mg total) by mouth daily with breakfast.  30 tablet  6  . LORazepam (ATIVAN) 0.5 MG tablet Take 0.5 mg by mouth daily.       . metoprolol tartrate (LOPRESSOR) 25 MG tablet Take 0.5 tablets (12.5 mg total) by mouth 2 (two) times daily.  60 tablet  6  . omeprazole (PRILOSEC) 20 MG capsule Take 20 mg by mouth at bedtime.       . sertraline (ZOLOFT) 50 MG tablet Take 50 mg by mouth daily.       . traMADol (ULTRAM) 50 MG tablet Take 50 mg by mouth daily as needed (pain).       . VESICARE 5 MG tablet BLADDER CONTROL       No current facility-administered medications for this visit.      Physical Exam:   BP 107/68  Pulse 61  Ht 5\' 5"  (1.651 m)  Wt 145 lb (65.772 kg)  BMI 24.13 kg/m2  SpO2  93%  General:  Frail but well-appearing  Chest:   Clear to auscultation  CV:   Regular rate and rhythm with diastolic murmur  Incisions:  Well-healed  Abdomen:  Soft nontender  Extremities:  Warm and well-perfused  Diagnostic Tests:  Transthoracic Echocardiography  Patient: Jamarl, Pew MR #: [id number] Study Date: 12/12/2013 Gender: M Age: 38 Height: 165.1 cm Weight: 67.1 kg BSA: 1.76 m^2 Pt. Status: Room:  ATTENDING [place] [place] SONOGRAPHER Donata Clay PERFORMING [facility] Outpatient  cc:  ------------------------------------------------------------------- LV EF: 55% - 60%  ------------------------------------------------------------------- Indications: Aortic valve disorder  (424.1).  ------------------------------------------------------------------- History: PMH: 30 days post- TAVR.  ------------------------------------------------------------------- Study Conclusions  - Left ventricle: The cavity size was normal. There was mild focal basal hypertrophy of the septum. Systolic function was normal. The estimated ejection fraction was in the range of 55% to 60%. Wall motion was normal; there were no regional wall motion abnormalities. There was an increased relative contribution of atrial contraction to ventricular filling. Doppler parameters are consistent with abnormal left ventricular relaxation (grade 1 diastolic dysfunction). - Aortic valve: S/P TAVR with AVR well seated. There are bilateral mild to moderate perivalvular leaks. - Mitral valve: Calcified annulus. There was trivial regurgitation. - Left atrium: The atrium was severely dilated. - Right atrium: The atrium was moderately to severely dilated. - Pulmonary arteries: PA peak pressure: 37 mm Hg (S).  Transthoracic echocardiography. M-mode, complete 2D, spectral Doppler, and color Doppler. Birthdate: Patient birthdate: [birth year]. Age: Patient is 73 yr old. Sex: Gender: male. Height: Height: 165.1 cm. Height: 65 in. Weight: Weight: 67.1 kg. Weight: 147.7 lb. Body mass index: BMI: 24.6 kg/m^2. Body surface area: BSA: 1.76 m^2. Blood pressure: 145/76 Patient status: Outpatient. Study date: Study date: 12/12/2013. Study time: 01:15 PM. Location: Echo laboratory.  -------------------------------------------------------------------  ------------------------------------------------------------------- Left ventricle: The cavity size was normal. There was mild focal basal hypertrophy of the septum. Systolic function was normal. The estimated ejection fraction was in the range of 55% to 60%. Wall motion was normal; there were no regional wall motion abnormalities. There was an increased relative  contribution of atrial contraction to ventricular filling. Doppler parameters are consistent with abnormal left ventricular relaxation (grade 1 diastolic dysfunction).  ------------------------------------------------------------------- Aortic valve: S/P TAVR with AVR well seated. There are bilateral mild to moderate perivalvular leaks. Trileaflet; normal thickness leaflets. Mobility was not restricted. Doppler: Transvalvular velocity was within the normal range. There was no stenosis. There was no regurgitation. Peak gradient (S): 23 mm Hg.  ------------------------------------------------------------------- Aorta: Aortic root: The aortic root was normal in size.  ------------------------------------------------------------------- Mitral valve: Calcified annulus. Mobility was not restricted. Doppler: Transvalvular velocity was within the normal range. There was no evidence for stenosis. There was trivial regurgitation. Peak gradient (D): 2 mm Hg.  ------------------------------------------------------------------- Left atrium: The atrium was severely dilated.  ------------------------------------------------------------------- Right ventricle: The cavity size was normal. Wall thickness was normal. Systolic function was normal.  ------------------------------------------------------------------- Pulmonic valve: Structurally normal valve. Cusp separation was normal. Doppler: Transvalvular velocity was within the normal range. There was no evidence for stenosis. There was no regurgitation.  ------------------------------------------------------------------- Tricuspid valve: Structurally normal valve. Doppler: Transvalvular velocity was within the normal range. There was mild regurgitation.  ------------------------------------------------------------------- Pulmonary artery: The main pulmonary artery was normal-sized. Systolic pressure was within the normal  range.  ------------------------------------------------------------------- Right atrium: The atrium was moderately to severely dilated.  ------------------------------------------------------------------- Pericardium: There was no pericardial effusion.  ------------------------------------------------------------------- Systemic veins: Inferior vena cava:  The vessel was dilated. The respirophasic diameter changes were blunted (< 50%), consistent with elevated central venous pressure.  ------------------------------------------------------------------- Prepared and Electronically Authenticated by  Fransico Him, MD 2015-07-31T15:10:50  ------------------------------------------------------------------- Measurements  Left ventricle Value 11/19/2013 Reference LV ID, ED, PLAX chordal (L) 34.4 mm ---------- 43 - 52 LV ID, ES, PLAX chordal (N) 25.4 mm ---------- 23 - 38 LV fx shortening, PLAX (L) 26 % ---------- >=29 chordal LV PW thickness, ED 11.9 mm ---------- --------- IVS/LV PW ratio, ED (N) 1.09 ---------- <=1.3 LV e&', lateral 8.7 cm/s ---------- --------- LV E/e&', lateral 9.03 ---------- --------- LV e&', medial 4.88 cm/s ---------- --------- LV E/e&', medial 16.11 ---------- --------- LV e&', average 6.79 cm/s ---------- --------- LV E/e&', average 11.58 ---------- ---------  Ventricular septum Value 11/19/2013 Reference IVS thickness, ED 13 mm ---------- ---------  Aortic valve Value 11/19/2013 Reference Aortic valve peak 241 cm/s 250 --------- velocity, S Aortic peak gradient, S 23 mm Hg 25 --------- Aortic regurg pressure 682 ms ---------- --------- half-time  Aorta Value 11/19/2013 Reference Aortic root ID, ED 34 mm ---------- ---------  Left atrium Value 11/19/2013 Reference LA ID, A-P, ES 48 mm ---------- --------- LA ID/bsa, A-P (H) 2.72 cm/m^2 ---------- <=2.2  Mitral valve Value 11/19/2013 Reference Mitral E-wave peak 78.6 cm/s ----------  --------- velocity Mitral A-wave peak 95.6 cm/s ---------- --------- velocity Mitral deceleration time (H) 250 ms ---------- 150 - 230 Mitral peak gradient, D 2 mm Hg ---------- --------- Mitral E/A ratio, peak 0.8 ---------- ---------  Pulmonary arteries Value 11/19/2013 Reference PA pressure, S, DP (H) 37 mm Hg ---------- <=30  Tricuspid valve Value 11/19/2013 Reference Tricuspid regurg peak 233 cm/s ---------- --------- velocity Tricuspid peak RV-RA 22 mm Hg ---------- --------- gradient Tricuspid maximal regurg 233 cm/s ---------- --------- velocity, PISA  Systemic veins Value 11/19/2013 Reference Estimated CVP 15 mm Hg ---------- ---------  Right ventricle Value 11/19/2013 Reference RV pressure, S, DP (H) 37 mm Hg ---------- <=30 RV s&', lateral, S 11.6 cm/s ---------- ---------  Legend: (L) and (H) mark values outside specified reference range.  (N) marks values inside specified reference range.    Impression:  The patient is clinically doing very well approximately 30 days status post transcatheter aortic valve replacement via open right transfemoral approach.  He has had some mild pain along the medial aspect of his right lower thigh and right knee which likely is related to his femoral incision for access. This pain is described as mild and improving. The patient has a moderate (2+) paravalvular leak which appears stable on followup echocardiogram performed last week.    Plan:  The patient will continue to followup with Dr. Marlou Porch and Dr. Burt Knack.  He should remain on Plavix for a total of 6 months from the time of his procedure.  No other changes were made in his current medications at this time.  He he will undergo routine followup echocardiogram at 1 year.  The patient and his daughter have been reminded regarding the need for lifelong antibiotic prophylaxis for all dental cleaning and related procedures.  I spent in excess of 15 minutes during the conduct of  this office consultation and >50% of this time involved direct face-to-face encounter with the patient for counseling and/or coordination of their care.   Valentina Gu. Roxy Manns, MD 12/15/2013 12:23 PM

## 2013-12-31 DIAGNOSIS — G20 Parkinson's disease: Secondary | ICD-10-CM | POA: Diagnosis not present

## 2013-12-31 DIAGNOSIS — Z48812 Encounter for surgical aftercare following surgery on the circulatory system: Secondary | ICD-10-CM | POA: Diagnosis not present

## 2014-01-06 DIAGNOSIS — K4090 Unilateral inguinal hernia, without obstruction or gangrene, not specified as recurrent: Secondary | ICD-10-CM | POA: Diagnosis not present

## 2014-01-06 DIAGNOSIS — R350 Frequency of micturition: Secondary | ICD-10-CM | POA: Diagnosis not present

## 2014-01-06 DIAGNOSIS — Z23 Encounter for immunization: Secondary | ICD-10-CM | POA: Diagnosis not present

## 2014-01-06 DIAGNOSIS — G20 Parkinson's disease: Secondary | ICD-10-CM | POA: Diagnosis not present

## 2014-01-06 DIAGNOSIS — I359 Nonrheumatic aortic valve disorder, unspecified: Secondary | ICD-10-CM | POA: Diagnosis not present

## 2014-01-12 ENCOUNTER — Ambulatory Visit (INDEPENDENT_AMBULATORY_CARE_PROVIDER_SITE_OTHER): Payer: Medicare Other | Admitting: Surgery

## 2014-01-12 ENCOUNTER — Encounter (INDEPENDENT_AMBULATORY_CARE_PROVIDER_SITE_OTHER): Payer: Self-pay

## 2014-01-12 ENCOUNTER — Encounter (INDEPENDENT_AMBULATORY_CARE_PROVIDER_SITE_OTHER): Payer: Self-pay | Admitting: Surgery

## 2014-01-12 VITALS — BP 124/74 | HR 70 | Resp 18 | Ht 66.0 in | Wt 144.0 lb

## 2014-01-12 DIAGNOSIS — K4020 Bilateral inguinal hernia, without obstruction or gangrene, not specified as recurrent: Secondary | ICD-10-CM

## 2014-01-12 NOTE — Progress Notes (Signed)
Patient ID: [name]., male   DOB: Oct 27, 1940, 73 y.o.   MRN: [id number]  Chief Complaint  Patient presents with  . Other    eval bilateral inguinal hernia    HPI [name]. is a 73 y.o. male.   HPI This is a pleasant gentleman referred by Dr. Nehemiah Settle for evaluation of bilateral inguinal hernias. He has had a hernia for several years but they're now becoming increasingly symptomatic with groin pain greater on the left than right. He reports that the left inguinal hernia always remains stuck out.  He has no nausea, vomiting, or obstructive symptoms. He has had a recent TAVR and is recovering well from the procedure. He is currently on Plavix and will remain so for 6 months. Past Medical History  Diagnosis Date  . Kidney stones   . Parkinson disease   . GERD (gastroesophageal reflux disease)   . Aortic stenosis   . Anxiety   . Headache(784.0)   . TIA (transient ischemic attack) 02/08/2007  . Parkinson's disease (tremor, stiffness, slow motion, unstable posture) 12/11/2006  . PONV (postoperative nausea and vomiting)   . Heart murmur   . Hernia, inguinal, bilateral, recurrent   . Restless leg syndrome   . S/P TAVR (transcatheter aortic valve replacement) 11/18/2013    26 mm Edwards Sapien XT transcatheter heart valve placed via open right transfemoral approach    Past Surgical History  Procedure Laterality Date  . Cataract  2008    bilateral  . Cystoscopy/retrograde/ureteroscopy  05/29/2011    Procedure: CYSTOSCOPY/RETROGRADE/URETEROSCOPY;  Surgeon: Malka So, MD;  Location: [facility];  Service: Urology;  Laterality: Left;  no stent   . Colonoscopy    . Cardiac catheterization  10/09/13  . Transcatheter aortic valve replacement, transfemoral N/A 11/18/2013    Procedure: TRANSCATHETER AORTIC VALVE REPLACEMENT, TRANSFEMORAL;  Surgeon: Sherren Mocha, MD;  Location: [facility];  Service: Open Heart Surgery;  Laterality: N/A;  . Intraoperative transesophageal  echocardiogram N/A 11/18/2013    Procedure: INTRAOPERATIVE TRANSESOPHAGEAL ECHOCARDIOGRAM;  Surgeon: Sherren Mocha, MD;  Location: [facility];  Service: Open Heart Surgery;  Laterality: N/A;    Family History  Problem Relation Age of Onset  . Anemia Daughter   . Asthma Daughter   . Cancer Father   . Diabetes Daughter   . Hypertension Daughter   . Kidney disease Daughter   . Cancer Son     Social History History  Substance Use Topics  . Smoking status: Never Smoker   . Smokeless tobacco: Never Used  . Alcohol Use: No    Allergies  Allergen Reactions  . Vicodin [Hydrocodone-Acetaminophen] Other (See Comments)    "Mental reaction"    Current Outpatient Prescriptions  Medication Sig Dispense Refill  . acetaminophen (TYLENOL) 500 MG tablet Take 1,000 mg by mouth 2 (two) times daily as needed (pain). For pain      . aspirin 81 MG tablet Take 1 tablet (81 mg total) by mouth daily.      . carbidopa-levodopa (SINEMET IR) 25-100 MG per tablet Take 1.5 tablets by mouth See admin instructions. Take 1 and 1/2 tablets at 10am, 2pm and 6pm.      . cholecalciferol (VITAMIN D) 1000 UNITS tablet Take 1,000 Units by mouth at bedtime.       . clonazePAM (KLONOPIN) 1 MG tablet Take 1.5 mg by mouth at bedtime.       . clopidogrel (PLAVIX) 75 MG tablet Take 1 tablet (75 mg total) by  mouth daily with breakfast.  30 tablet  6  . LORazepam (ATIVAN) 0.5 MG tablet Take 0.5 mg by mouth daily.       . metoprolol tartrate (LOPRESSOR) 25 MG tablet Take 0.5 tablets (12.5 mg total) by mouth 2 (two) times daily.  60 tablet  6  . omeprazole (PRILOSEC) 20 MG capsule Take 20 mg by mouth at bedtime.       . sertraline (ZOLOFT) 50 MG tablet Take 50 mg by mouth daily.       . VESICARE 5 MG tablet BLADDER CONTROL      . butalbital-acetaminophen-caffeine (ESGIC PLUS) 50-500-40 MG per tablet Take 1 tablet by mouth every 4 (four) hours as needed (headaches).       . traMADol (ULTRAM) 50 MG tablet Take 50 mg by mouth daily  as needed (pain).        No current facility-administered medications for this visit.    Review of Systems Review of Systems  Constitutional: Negative for fever, chills and unexpected weight change.  HENT: Negative for congestion, hearing loss, sore throat, trouble swallowing and voice change.   Eyes: Negative for visual disturbance.  Respiratory: Negative for cough and wheezing.   Cardiovascular: Negative for chest pain, palpitations and leg swelling.  Gastrointestinal: Positive for abdominal pain. Negative for nausea, vomiting, diarrhea, constipation, blood in stool, abdominal distention, anal bleeding and rectal pain.  Genitourinary: Negative for hematuria and difficulty urinating.  Musculoskeletal: Negative for arthralgias.  Skin: Negative for rash and wound.  Neurological: Positive for tremors, speech difficulty and weakness. Negative for seizures, syncope and headaches.  Hematological: Negative for adenopathy. Does not bruise/bleed easily.  Psychiatric/Behavioral: Negative for confusion.    Blood pressure 124/74, pulse 70, resp. rate 18, height 5\' 6"  (1.676 m), weight 144 lb (65.318 kg).  Physical Exam Physical Exam  Constitutional: He is oriented to person, place, and time. He appears well-nourished. No distress.  Elderly gentleman in no acute distress  HENT:  Head: Normocephalic and atraumatic.  Right Ear: External ear normal.  Left Ear: External ear normal.  Nose: Nose normal.  Mouth/Throat: Oropharynx is clear and moist. No oropharyngeal exudate.  Eyes: Conjunctivae are normal. Pupils are equal, round, and reactive to light. Right eye exhibits no discharge. Left eye exhibits no discharge. No scleral icterus.  Neck: Normal range of motion. Neck supple. No tracheal deviation present.  Cardiovascular: Normal rate, regular rhythm, normal heart sounds and intact distal pulses.   No murmur heard. Pulmonary/Chest: Effort normal and breath sounds normal. No respiratory  distress. He has no wheezes. He has no rales.  Abdominal: Soft. Bowel sounds are normal. He exhibits no distension. There is no tenderness.  Bilateral, nontender inguinal hernias. The left inguinal hernia definitely contains colon and is difficult to reduce  Musculoskeletal: Normal range of motion. He exhibits no edema and no tenderness.  Lymphadenopathy:    He has no cervical adenopathy.  Neurological: He is alert and oriented to person, place, and time.  Skin: Skin is warm and dry. No rash noted. No erythema.  Psychiatric: His behavior is normal. Judgment normal.    Data Reviewed   Assessment    Bilateral inguinal hernias     Plan    As he is symptomatic and I believe the left anal hernia is partly incarcerated, repair with mesh was recommended. I discussed both the bilateral and open techniques. I recommend the open technique as I can limit his anesthesia as well as the fact that I could do the  surgery with him remaining on Plavix. If he desires a laparoscopic approach, he would need to wait at least 6 months should he come off the Plavix. He'll also need cardiac clearance. We will arrange for cardiac clearance and he will call me back as to whether or not he wants to proceed with surgery.        Amye Grego A 01/12/2014, 5:07 PM

## 2014-01-12 NOTE — Patient Instructions (Signed)
We will get cardiac clearance for surgery.  Please notify us whether you want to proceed with surgery or not.

## 2014-01-19 DIAGNOSIS — G20 Parkinson's disease: Secondary | ICD-10-CM | POA: Diagnosis not present

## 2014-01-19 DIAGNOSIS — Z48812 Encounter for surgical aftercare following surgery on the circulatory system: Secondary | ICD-10-CM | POA: Diagnosis not present

## 2014-01-21 ENCOUNTER — Telehealth: Payer: Self-pay | Admitting: Cardiology

## 2014-01-21 NOTE — Telephone Encounter (Signed)
Received request from Nurse fax box, documents faxed for surgical clearance. To: [facility] Fax number: [phone] Attention: 9.9.15/km

## 2014-01-26 ENCOUNTER — Other Ambulatory Visit (INDEPENDENT_AMBULATORY_CARE_PROVIDER_SITE_OTHER): Payer: Self-pay | Admitting: Surgery

## 2014-03-06 ENCOUNTER — Encounter (HOSPITAL_COMMUNITY): Payer: Self-pay | Admitting: Pharmacy Technician

## 2014-03-16 DIAGNOSIS — G20 Parkinson's disease: Secondary | ICD-10-CM | POA: Diagnosis not present

## 2014-03-20 ENCOUNTER — Ambulatory Visit (INDEPENDENT_AMBULATORY_CARE_PROVIDER_SITE_OTHER): Payer: Medicare Other | Admitting: Cardiology

## 2014-03-20 ENCOUNTER — Encounter: Payer: Self-pay | Admitting: Cardiology

## 2014-03-20 VITALS — BP 128/78 | HR 63 | Ht 65.5 in | Wt 144.0 lb

## 2014-03-20 DIAGNOSIS — Z954 Presence of other heart-valve replacement: Secondary | ICD-10-CM

## 2014-03-20 DIAGNOSIS — I359 Nonrheumatic aortic valve disorder, unspecified: Secondary | ICD-10-CM

## 2014-03-20 DIAGNOSIS — G20 Parkinson's disease: Secondary | ICD-10-CM | POA: Diagnosis not present

## 2014-03-20 DIAGNOSIS — Z952 Presence of prosthetic heart valve: Secondary | ICD-10-CM

## 2014-03-20 MED ORDER — METOPROLOL TARTRATE 25 MG PO TABS: 12.5000 mg | ORAL_TABLET | Freq: Two times a day (BID) | ORAL | Status: DC

## 2014-03-20 NOTE — Progress Notes (Signed)
[facility], [address], [place]  [address] Phone: [phone] Fax:  [phone]  Date:  03/20/2014   ID:  [name]., DOB [birth year], MRN [id number]  PCP:  Dorian Heckle, MD   History of Present Illness: [name]. is a 73 y.o. male with Parkinson's disease status post transcatheter aortic valve replacement #26 mm Edwards's AP and valve on 11/18/13 secondary to severe aortic stenosis.  He is doing very well postoperatively. Has mild to moderate perivalvular leak. Overall normal ejection fraction. Feeling well. States that he is "a little jittery at times ".  He still continues to have hernia and is scheduled for repair soon. This is fine. He may discontinue his Plavix one week prior, see below.   Wt Readings from Last 3 Encounters:  03/20/14 144 lb (65.318 kg)  01/12/14 144 lb (65.318 kg)  12/15/13 145 lb (65.772 kg)     Past Medical History  Diagnosis Date  . Kidney stones   . Parkinson disease   . GERD (gastroesophageal reflux disease)   . Aortic stenosis   . Anxiety   . Headache(784.0)   . TIA (transient ischemic attack) 02/08/2007  . Parkinson's disease (tremor, stiffness, slow motion, unstable posture) 12/11/2006  . PONV (postoperative nausea and vomiting)   . Heart murmur   . Hernia, inguinal, bilateral, recurrent   . Restless leg syndrome   . S/P TAVR (transcatheter aortic valve replacement) 11/18/2013    26 mm Edwards Sapien XT transcatheter heart valve placed via open right transfemoral approach    Past Surgical History  Procedure Laterality Date  . Cataract  2008    bilateral  . Cystoscopy/retrograde/ureteroscopy  05/29/2011    Procedure: CYSTOSCOPY/RETROGRADE/URETEROSCOPY;  Surgeon: Malka So, MD;  Location: [facility];  Service: Urology;  Laterality: Left;  no stent   . Colonoscopy    . Cardiac catheterization  10/09/13  . Transcatheter aortic valve replacement, transfemoral N/A 11/18/2013    Procedure: TRANSCATHETER  AORTIC VALVE REPLACEMENT, TRANSFEMORAL;  Surgeon: Sherren Mocha, MD;  Location: [facility];  Service: Open Heart Surgery;  Laterality: N/A;  . Intraoperative transesophageal echocardiogram N/A 11/18/2013    Procedure: INTRAOPERATIVE TRANSESOPHAGEAL ECHOCARDIOGRAM;  Surgeon: Sherren Mocha, MD;  Location: [facility] OR;  Service: Open Heart Surgery;  Laterality: N/A;    Current Outpatient Prescriptions  Medication Sig Dispense Refill  . acetaminophen (TYLENOL) 500 MG tablet Take 1,000 mg by mouth 2 (two) times daily as needed (pain). For pain    . aspirin EC 81 MG tablet Take by mouth.    . carbidopa-levodopa (SINEMET IR) 25-100 MG per tablet Take 1.5 tablets by mouth See admin instructions. Take 1 and 1/2 tablets at 10am, 2pm and 6pm.    . cholecalciferol (VITAMIN D) 1000 UNITS tablet Take 1,000 Units by mouth at bedtime.     . clonazePAM (KLONOPIN) 1 MG tablet Take 1.5 mg by mouth at bedtime.     . clopidogrel (PLAVIX) 75 MG tablet Take 1 tablet (75 mg total) by mouth daily with breakfast. 30 tablet 6  . donepezil (ARICEPT) 10 MG tablet Take by mouth.    Marland Kitchen LORazepam (ATIVAN) 0.5 MG tablet Take 0.5 mg by mouth daily.     . metoprolol tartrate (LOPRESSOR) 25 MG tablet Take 0.5 tablets (12.5 mg total) by mouth 2 (two) times daily. 60 tablet 6  . omeprazole (PRILOSEC) 20 MG capsule Take 20 mg by mouth at bedtime.     . sertraline (  ZOLOFT) 50 MG tablet Take 50 mg by mouth daily.     . traMADol (ULTRAM) 50 MG tablet Take 50 mg by mouth daily as needed (pain).      No current facility-administered medications for this visit.    Allergies:    Allergies  Allergen Reactions  . Vicodin [Hydrocodone-Acetaminophen] Other (See Comments)    "Mental reaction"    Social History:  The patient  reports that he has never smoked. He has never used smokeless tobacco. He reports that he does not drink alcohol or use illicit drugs.   Family History  Problem Relation Age of Onset  . Anemia Daughter   . Asthma Daughter    . Cancer Father   . Diabetes Daughter   . Hypertension Daughter   . Kidney disease Daughter   . Cancer Son     ROS:  Please see the history of present illness.   Denies any fevers, chills, orthopnea, PND   All other systems reviewed and negative.   PHYSICAL EXAM: VS:  BP 128/78 mmHg  Pulse 63  Ht 5' 5.5" (1.664 m)  Wt 144 lb (65.318 kg)  BMI 23.59 kg/m2 Well nourished, well developed, in no acute distress HEENT: normal, [place]/AT, EOMI Neck: no JVD, normal carotid upstroke, no bruit Cardiac:  normal S1, S2; RRR; no murmur Lungs:  clear to auscultation bilaterally, no wheezing, rhonchi or rales Abd: soft, nontender, no hepatomegaly, no bruits Ext: no edema, 2+ distal pulses Skin: warm and dry GU: deferred Neuro: no focal abnormalities noted, AAO x 3  EKG:  None today     ASSESSMENT AND PLAN:  1. History of aortic valve replacement via TAVR - doing very well, mild to moderate perivalvular leak. Explained to him. Asymptomatic. I'm comfortable with him proceeding with hernia repair. I had discussed this previously with Dr. Burt Knack and Roxy Manns. Since he is close to his 6 month postop, I'm also comfortable with him stopping his Plavix one week prior to surgery and continuing with discontinuation. He should continue with his aspirin 81 mg. We will continue with low-dose metoprolol. 2. Reminded him of dental prophylaxis. 3. Preoperative risk-may proceed with hernia repair. Low to moderate overall cardiac risk at this point. 4. We will send neurology at [facility] notes. Dr. Charise Killian. 5. We will see him back in 4 months.  Signed, Candee Furbish, MD [facility]  03/20/2014 10:55 AM

## 2014-03-20 NOTE — Patient Instructions (Signed)
The current medical regimen is effective;  continue present plan and medications. You may stop your Plavix 1 week before your hernia surgery.  You do not have to restart it after your surgery.   Follow up in 4 months with Dr Marlou Porch.

## 2014-03-23 ENCOUNTER — Encounter (HOSPITAL_COMMUNITY)
Admission: RE | Admit: 2014-03-23 | Discharge: 2014-03-23 | Disposition: A | Discharge status: Home / Self Care | Payer: Medicare Other | Source: Ambulatory Visit | Attending: Surgery | Admitting: Surgery

## 2014-03-23 ENCOUNTER — Encounter (HOSPITAL_COMMUNITY): Payer: Self-pay

## 2014-03-23 DIAGNOSIS — K4020 Bilateral inguinal hernia, without obstruction or gangrene, not specified as recurrent: Secondary | ICD-10-CM | POA: Diagnosis not present

## 2014-03-23 DIAGNOSIS — Z01812 Encounter for preprocedural laboratory examination: Secondary | ICD-10-CM | POA: Diagnosis not present

## 2014-03-23 HISTORY — DX: Major depressive disorder, single episode, unspecified: F32.9

## 2014-03-23 HISTORY — DX: Malignant (primary) neoplasm, unspecified: C80.1

## 2014-03-23 HISTORY — DX: Unspecified osteoarthritis, unspecified site: M19.90

## 2014-03-23 HISTORY — DX: Depression, unspecified: F32.A

## 2014-03-23 LAB — BASIC METABOLIC PANEL
Anion gap: 11 (ref 5–15)
BUN: 18 mg/dL (ref 6–23)
CALCIUM: 9.5 mg/dL (ref 8.4–10.5)
CO2: 28 meq/L (ref 19–32)
CREATININE: 0.8 mg/dL (ref 0.50–1.35)
Chloride: 102 mEq/L (ref 96–112)
GFR calc Af Amer: >90 mL/min (ref >90)
GFR calc non Af Amer: 87 mL/min — ABNORMAL LOW (ref >90)
Glucose, Bld: 112 mg/dL — ABNORMAL HIGH (ref 70–99)
Potassium: 4.4 mEq/L (ref 3.7–5.3)
Sodium: 141 mEq/L (ref 137–147)

## 2014-03-23 LAB — CBC
HEMATOCRIT: 40.9 % (ref 39.0–52.0)
HEMOGLOBIN: 13.5 g/dL (ref 13.0–17.0)
MCH: 28.8 pg (ref 26.0–34.0)
MCHC: 33 g/dL (ref 30.0–36.0)
MCV: 87.4 fL (ref 78.0–100.0)
Platelets: 113 10*3/uL — ABNORMAL LOW (ref 150–400)
RBC: 4.68 MIL/uL (ref 4.22–5.81)
RDW: 13.4 % (ref 11.5–15.5)
WBC: 8.5 10*3/uL (ref 4.0–10.5)

## 2014-03-23 NOTE — Pre-Procedure Instructions (Signed)
[name].  03/23/2014   Your procedure is scheduled on:  03/30/2014  Report to [facility] Admitting at 5:30 AM.  Call this number if you have problems the morning of surgery: [phone]   Remember:   Do not eat food or drink liquids after midnight. On Sunday night after midnight   Take these medicines the morning of surgery with A SIP OF WATER: Sinemet, Lorazepam, metoprolol,( lopressor), zoloft  & take Tramadol- in the a.m. Before surgery if needed    Do not wear jewelry  Do not wear lotions, powders, or perfumes. You may wear deodorant.   Men may shave face and neck.  Do not bring valuables to the hospital.  [facility] is not responsible                  for any belongings or valuables.               Contacts, dentures or bridgework may not be worn into surgery.  Leave suitcase in the car. After surgery it may be brought to your room.  For patients admitted to the hospital, discharge time is determined by your                treatment team.               Patients discharged the day of surgery will not be allowed to drive  home.  Name and phone number of your driver: /w FAMILY  Special Instructions: Special Instructions: [place] - Preparing for Surgery  Before surgery, you can play an important role.  Because skin is not sterile, your skin needs to be as free of germs as possible.  You can reduce the number of germs on you skin by washing with CHG (chlorahexidine gluconate) soap before surgery.  CHG is an antiseptic cleaner which kills germs and bonds with the skin to continue killing germs even after washing.  Please DO NOT use if you have an allergy to CHG or antibacterial soaps.  If your skin becomes reddened/irritated stop using the CHG and inform your nurse when you arrive at Short Stay.  Do not shave (including legs and underarms) for at least 48 hours prior to the first CHG shower.  You may shave your face.  Please follow these instructions  carefully:   1.  Shower with CHG Soap the night before surgery and the  morning of Surgery.  2.  If you choose to wash your hair, wash your hair first as usual with your  normal shampoo.  3.  After you shampoo, rinse your hair and body thoroughly to remove the  Shampoo.  4.  Use CHG as you would any other liquid soap.  You can apply chg directly to the skin and wash gently with scrungie or a clean washcloth.  5.  Apply the CHG Soap to your body ONLY FROM THE NECK DOWN.    Do not use on open wounds or open sores.  Avoid contact with your eyes, ears, mouth and genitals (private parts).  Wash genitals (private parts)   with your normal soap.  6.  Wash thoroughly, paying special attention to the area where your surgery will be performed.  7.  Thoroughly rinse your body with warm water from the neck down.  8.  DO NOT shower/wash with your normal soap after using and rinsing off   the CHG Soap.  9.  Pat yourself dry with a clean towel.  10.  Wear clean pajamas.            11.  Place clean sheets on your bed the night of your first shower and do not sleep with pets.  Day of Surgery  Do not apply any lotions/deodorants the morning of surgery.  Please wear clean clothes to the hospital/surgery center.   Please read over the following fact sheets that you were given: Pain Booklet, Coughing and Deep Breathing and Surgical Site Infection Prevention

## 2014-03-23 NOTE — Progress Notes (Signed)
Call to A. Zelenak,PA-C, reviewed pt. history & recent visit with Dr. Marlou Porch. She will further review this pt.'s  cHart

## 2014-03-23 NOTE — Progress Notes (Signed)
Anesthesia Chart Review:  Patient is a 73 year old male scheduled for open bilateral IHR on 03/30/14 by Dr. Coralie Keens.  History includes AS s/p TAVR 11/18/13 (with mild to moderate perivalvular leak), TIA '08, Parkinson's disease, GERD, nephrolithiasis, RLS, anxiety, non-smoker.  PCP is listed as Dr. Dorian Heckle. Neurologist is Dr. Erie Noe [facility]; see Care Everywhere) who is aware of upcoming surgery.   Cardiologist is Dr. Marlou Porch, last visit on 03/20/14 for follow-up and preoperative evaluation.  He felt patient could proceed with surgery with low to moderate risk.  He was given permission to hold Plavix one week prior to surgery.    Echo on 12/12/13:  - Left ventricle: The cavity size was normal. There was mild focal basal hypertrophy of the septum. Systolic function was normal. The estimated ejection fraction was in the range of 55% to 60%.Wall motion was normal; there were no regional wall motionabnormalities. There was an increased relative contribution ofatrial contraction to ventricular filling. Doppler parameters areconsistent with abnormal left ventricular relaxation (grade 1 diastolic dysfunction). - Aortic valve: S/P TAVR with AVR well seated. There are bilateralmild to moderate perivalvular leaks. - Mitral valve: Calcified annulus. There was trivial regurgitation. - Left atrium: The atrium was severely dilated. - Right atrium: The atrium was moderately to severely dilated. - Pulmonary arteries: PA peak pressure: 37 mm Hg (S).  Cardiac cath (pre-TAVR) 10/09/13: 1. Patent coronary arteries (left dominant) with mild nonobstructive disease of the LAD (40% LAD just beyond DIAG). 2. Severely calcified, restricted aortic valve (likely bicuspid) with at least moderate AI.  3. Essentially normal right heart hemodynamics except for mildly elevated PA pressure.   EKG 12/03/13: NSR.  PFTs 11/17/13: FVC 2.88 (82%), FEV1 2.30 (91%), DLCO 31.74 (123%).  Preoperative labs noted.   PLT count 113K (previously with thrombocytopenia when compared to prior labs in Epic dating back to 11/17/13).  If no acute changes then I anticipate that he can proceed as planned.  [facility]An[facility]hesiology Phone [phone] 03/23/2014 5:06 PM

## 2014-03-23 NOTE — Progress Notes (Signed)
Pt. & his daughter that is accompanying him to this appt. Understand the need to D/C plavix  As of today ;(continually) & also continue the asa.

## 2014-03-29 MED ORDER — CEFAZOLIN SODIUM-DEXTROSE 2-3 GM-% IV SOLR
2.0000 g | INTRAVENOUS | Status: AC
  Administered 2014-03-30: 2 g via INTRAVENOUS
  Filled 2014-03-29: qty 50

## 2014-03-29 NOTE — H&P (Signed)
Chief Complaint  Patient presents with  . Other    eval bilateral inguinal hernia    HPI [name]. is a 73 y.o. male.  HPI This is a pleasant gentleman referred by Dr. Nehemiah Settle for evaluation of bilateral inguinal hernias. He has had a hernia for several years but they're now becoming increasingly symptomatic with groin pain greater on the left than right. He reports that the left inguinal hernia always remains stuck out. He has no nausea, vomiting, or obstructive symptoms. He has had a recent TAVR and is recovering well from the procedure. He is currently on Plavix and will remain so for 6 months. Past Medical History  Diagnosis Date  . Kidney stones   . Parkinson disease   . GERD (gastroesophageal reflux disease)   . Aortic stenosis   . Anxiety   . Headache(784.0)   . TIA (transient ischemic attack) 02/08/2007  . Parkinson's disease (tremor, stiffness, slow motion, unstable posture) 12/11/2006  . PONV (postoperative nausea and vomiting)   . Heart murmur   . Hernia, inguinal, bilateral, recurrent   . Restless leg syndrome   . S/P TAVR (transcatheter aortic valve replacement) 11/18/2013    26 mm Edwards Sapien XT transcatheter heart valve placed via open right transfemoral approach    Past Surgical History  Procedure Laterality Date  . Cataract  2008    bilateral  . Cystoscopy/retrograde/ureteroscopy  05/29/2011    Procedure: CYSTOSCOPY/RETROGRADE/URETEROSCOPY; Surgeon: Malka So, MD; Location: [facility]; Service: Urology; Laterality: Left; no stent   . Colonoscopy    . Cardiac catheterization  10/09/13  . Transcatheter aortic valve replacement, transfemoral N/A 11/18/2013    Procedure: TRANSCATHETER AORTIC VALVE REPLACEMENT, TRANSFEMORAL; Surgeon: Sherren Mocha, MD; Location: [place]; Service: Open Heart Surgery; Laterality: N/A;  . Intraoperative transesophageal  echocardiogram N/A 11/18/2013    Procedure: INTRAOPERATIVE TRANSESOPHAGEAL ECHOCARDIOGRAM; Surgeon: Sherren Mocha, MD; Location: [facility] OR; Service: Open Heart Surgery; Laterality: N/A;    Family History  Problem Relation Age of Onset  . Anemia Daughter   . Asthma Daughter   . Cancer Father   . Diabetes Daughter   . Hypertension Daughter   . Kidney disease Daughter   . Cancer Son     Social History History  Substance Use Topics  . Smoking status: Never Smoker   . Smokeless tobacco: Never Used  . Alcohol Use: No    Allergies  Allergen Reactions  . Vicodin [Hydrocodone-Acetaminophen] Other (See Comments)    "Mental reaction"    Current Outpatient Prescriptions  Medication Sig Dispense Refill  . acetaminophen (TYLENOL) 500 MG tablet Take 1,000 mg by mouth 2 (two) times daily as needed (pain). For pain    . aspirin 81 MG tablet Take 1 tablet (81 mg total) by mouth daily.    . carbidopa-levodopa (SINEMET IR) 25-100 MG per tablet Take 1.5 tablets by mouth See admin instructions. Take 1 and 1/2 tablets at 10am, 2pm and 6pm.    . cholecalciferol (VITAMIN D) 1000 UNITS tablet Take 1,000 Units by mouth at bedtime.     . clonazePAM (KLONOPIN) 1 MG tablet Take 1.5 mg by mouth at bedtime.     . clopidogrel (PLAVIX) 75 MG tablet Take 1 tablet (75 mg total) by mouth daily with breakfast. 30 tablet 6  . LORazepam (ATIVAN) 0.5 MG tablet Take 0.5 mg by mouth daily.     . metoprolol tartrate (LOPRESSOR) 25 MG tablet Take 0.5 tablets (12.5 mg total) by mouth 2 (two) times  daily. 60 tablet 6  . omeprazole (PRILOSEC) 20 MG capsule Take 20 mg by mouth at bedtime.     . sertraline (ZOLOFT) 50 MG tablet Take 50 mg by mouth daily.     . VESICARE 5 MG tablet BLADDER CONTROL    . butalbital-acetaminophen-caffeine (ESGIC PLUS) 50-500-40 MG per tablet Take 1  tablet by mouth every 4 (four) hours as needed (headaches).     . traMADol (ULTRAM) 50 MG tablet Take 50 mg by mouth daily as needed (pain).      No current facility-administered medications for this visit.    Review of Systems Review of Systems  Constitutional: Negative for fever, chills and unexpected weight change.  HENT: Negative for congestion, hearing loss, sore throat, trouble swallowing and voice change.  Eyes: Negative for visual disturbance.  Respiratory: Negative for cough and wheezing.  Cardiovascular: Negative for chest pain, palpitations and leg swelling.  Gastrointestinal: Positive for abdominal pain. Negative for nausea, vomiting, diarrhea, constipation, blood in stool, abdominal distention, anal bleeding and rectal pain.  Genitourinary: Negative for hematuria and difficulty urinating.  Musculoskeletal: Negative for arthralgias.  Skin: Negative for rash and wound.  Neurological: Positive for tremors, speech difficulty and weakness. Negative for seizures, syncope and headaches.  Hematological: Negative for adenopathy. Does not bruise/bleed easily.  Psychiatric/Behavioral: Negative for confusion.    Blood pressure 124/74, pulse 70, resp. rate 18, height 5\' 6"  (1.676 m), weight 144 lb (65.318 kg).  Physical Exam Physical Exam  Constitutional: He is oriented to person, place, and time. He appears well-nourished. No distress.  Elderly gentleman in no acute distress  HENT:  Head: Normocephalic and atraumatic.  Right Ear: External ear normal.  Left Ear: External ear normal.  Nose: Nose normal.  Mouth/Throat: Oropharynx is clear and moist. No oropharyngeal exudate.  Eyes: Conjunctivae are normal. Pupils are equal, round, and reactive to light. Right eye exhibits no discharge. Left eye exhibits no discharge. No scleral icterus.  Neck: Normal range of motion. Neck supple. No tracheal deviation present.  Cardiovascular: Normal rate, regular rhythm, normal  heart sounds and intact distal pulses.  No murmur heard. Pulmonary/Chest: Effort normal and breath sounds normal. No respiratory distress. He has no wheezes. He has no rales.  Abdominal: Soft. Bowel sounds are normal. He exhibits no distension. There is no tenderness.  Bilateral, nontender inguinal hernias. The left inguinal hernia definitely contains colon and is difficult to reduce  Musculoskeletal: Normal range of motion. He exhibits no edema and no tenderness.  Lymphadenopathy:   He has no cervical adenopathy.  Neurological: He is alert and oriented to person, place, and time.  Skin: Skin is warm and dry. No rash noted. No erythema.  Psychiatric: His behavior is normal. Judgment normal.    Data Reviewed   Assessment    Bilateral inguinal hernias     Plan    As he is symptomatic and I believe the left anal hernia is partly incarcerated, repair with mesh was recommended. I discussed both the bilateral and open techniques. I recommend the open technique as I can limit his anesthesia as well as the fact that I could do the surgery with him remaining on Plavix. If he desires a laparoscopic approach, he would need to wait at least 6 months should he come off the Plavix. He'll also need cardiac clearance. We will arrange for cardiac clearance and he will call me back as to whether or not he wants to proceed with surgery.   Cardiac clearance obtained.  Patient  wishes to proceed with open bilateral repair with mesh       Louiza Moor A

## 2014-03-30 ENCOUNTER — Ambulatory Visit (HOSPITAL_BASED_OUTPATIENT_CLINIC_OR_DEPARTMENT_OTHER)
Admission: RE | Admit: 2014-03-30 | Discharge: 2014-03-30 | Disposition: A | Discharge status: Home / Self Care | Payer: Medicare Other | Source: Ambulatory Visit | Attending: Surgery | Admitting: Surgery

## 2014-03-30 ENCOUNTER — Ambulatory Visit (HOSPITAL_COMMUNITY): Payer: Medicare Other | Admitting: Anesthesiology

## 2014-03-30 ENCOUNTER — Ambulatory Visit (HOSPITAL_COMMUNITY): Payer: Medicare Other | Admitting: Vascular Surgery

## 2014-03-30 ENCOUNTER — Encounter (HOSPITAL_COMMUNITY)
Admission: RE | Disposition: A | Discharge status: Home / Self Care | Payer: Self-pay | Source: Ambulatory Visit | Attending: Surgery

## 2014-03-30 ENCOUNTER — Encounter (HOSPITAL_COMMUNITY): Payer: Self-pay | Admitting: *Deleted

## 2014-03-30 DIAGNOSIS — F419 Anxiety disorder, unspecified: Secondary | ICD-10-CM | POA: Insufficient documentation

## 2014-03-30 DIAGNOSIS — I350 Nonrheumatic aortic (valve) stenosis: Secondary | ICD-10-CM | POA: Diagnosis not present

## 2014-03-30 DIAGNOSIS — K4020 Bilateral inguinal hernia, without obstruction or gangrene, not specified as recurrent: Secondary | ICD-10-CM | POA: Diagnosis not present

## 2014-03-30 DIAGNOSIS — G20 Parkinson's disease: Secondary | ICD-10-CM | POA: Insufficient documentation

## 2014-03-30 DIAGNOSIS — Z8673 Personal history of transient ischemic attack (TIA), and cerebral infarction without residual deficits: Secondary | ICD-10-CM | POA: Insufficient documentation

## 2014-03-30 DIAGNOSIS — G2581 Restless legs syndrome: Secondary | ICD-10-CM | POA: Insufficient documentation

## 2014-03-30 DIAGNOSIS — K219 Gastro-esophageal reflux disease without esophagitis: Secondary | ICD-10-CM | POA: Insufficient documentation

## 2014-03-30 DIAGNOSIS — Z952 Presence of prosthetic heart valve: Secondary | ICD-10-CM | POA: Insufficient documentation

## 2014-03-30 DIAGNOSIS — K4000 Bilateral inguinal hernia, with obstruction, without gangrene, not specified as recurrent: Secondary | ICD-10-CM | POA: Diagnosis not present

## 2014-03-30 HISTORY — PX: INSERTION OF MESH: SHX5868

## 2014-03-30 HISTORY — PX: INGUINAL HERNIA REPAIR: SHX194

## 2014-03-30 SURGERY — REPAIR, HERNIA, INGUINAL, BILATERAL, ADULT
Anesthesia: General | Laterality: Bilateral

## 2014-03-30 MED ORDER — ARTIFICIAL TEARS OP OINT
TOPICAL_OINTMENT | OPHTHALMIC | Status: DC | PRN
  Administered 2014-03-30: 1 via OPHTHALMIC

## 2014-03-30 MED ORDER — BUPIVACAINE-EPINEPHRINE (PF) 0.5% -1:200000 IJ SOLN
INTRAMUSCULAR | Status: AC
Start: 2014-03-30 — End: 2014-03-30
  Filled 2014-03-30: qty 30

## 2014-03-30 MED ORDER — ACETAMINOPHEN 325 MG PO TABS
ORAL_TABLET | ORAL | Status: AC
  Filled 2014-03-30: qty 2

## 2014-03-30 MED ORDER — BUPIVACAINE-EPINEPHRINE 0.5% -1:200000 IJ SOLN
INTRAMUSCULAR | Status: DC | PRN
  Administered 2014-03-30: 30 mL

## 2014-03-30 MED ORDER — FENTANYL CITRATE 0.05 MG/ML IJ SOLN
INTRAMUSCULAR | Status: AC
  Filled 2014-03-30: qty 5

## 2014-03-30 MED ORDER — PROPOFOL 10 MG/ML IV BOLUS
INTRAVENOUS | Status: AC
  Filled 2014-03-30: qty 20

## 2014-03-30 MED ORDER — 0.9 % SODIUM CHLORIDE (POUR BTL) OPTIME
TOPICAL | Status: DC | PRN
  Administered 2014-03-30: 1000 mL

## 2014-03-30 MED ORDER — LIDOCAINE HCL (CARDIAC) 20 MG/ML IV SOLN
INTRAVENOUS | Status: DC | PRN
Start: 2014-03-30 — End: 2014-03-30
  Administered 2014-03-30: 100 mg via INTRAVENOUS

## 2014-03-30 MED ORDER — ROCURONIUM BROMIDE 50 MG/5ML IV SOLN
INTRAVENOUS | Status: AC
  Filled 2014-03-30: qty 1

## 2014-03-30 MED ORDER — GLYCOPYRROLATE 0.2 MG/ML IJ SOLN
INTRAMUSCULAR | Status: AC
  Filled 2014-03-30: qty 2

## 2014-03-30 MED ORDER — HYDROMORPHONE HCL 1 MG/ML IJ SOLN: 0.2500 mg | INTRAMUSCULAR | Status: DC | PRN

## 2014-03-30 MED ORDER — OXYCODONE HCL 5 MG PO TABS: 5.0000 mg | ORAL_TABLET | Freq: Once | ORAL | Status: DC | PRN

## 2014-03-30 MED ORDER — ONDANSETRON HCL 4 MG/2ML IJ SOLN
4.0000 mg | Freq: Once | INTRAMUSCULAR | Status: DC | PRN
Start: 2014-03-30 — End: 2014-03-30

## 2014-03-30 MED ORDER — OXYCODONE HCL 5 MG/5ML PO SOLN: 5.0000 mg | Freq: Once | ORAL | Status: DC | PRN

## 2014-03-30 MED ORDER — LACTATED RINGERS IV SOLN
INTRAVENOUS | Status: DC | PRN
  Administered 2014-03-30: 06:00:00 via INTRAVENOUS

## 2014-03-30 MED ORDER — PROPOFOL 10 MG/ML IV BOLUS
INTRAVENOUS | Status: DC | PRN
  Administered 2014-03-30: 120 mg via INTRAVENOUS

## 2014-03-30 MED ORDER — ACETAMINOPHEN 325 MG PO TABS
650.0000 mg | ORAL_TABLET | ORAL | Status: DC | PRN
  Administered 2014-03-30: 650 mg via ORAL

## 2014-03-30 MED ORDER — ONDANSETRON HCL 4 MG/2ML IJ SOLN
INTRAMUSCULAR | Status: AC
  Filled 2014-03-30: qty 2

## 2014-03-30 MED ORDER — EPHEDRINE SULFATE 50 MG/ML IJ SOLN
INTRAMUSCULAR | Status: DC | PRN
  Administered 2014-03-30: 10 mg via INTRAVENOUS

## 2014-03-30 MED ORDER — MIDAZOLAM HCL 2 MG/2ML IJ SOLN
INTRAMUSCULAR | Status: AC
Start: 2014-03-30 — End: 2014-03-30
  Filled 2014-03-30: qty 2

## 2014-03-30 MED ORDER — MEPERIDINE HCL 25 MG/ML IJ SOLN: 6.2500 mg | INTRAMUSCULAR | Status: DC | PRN

## 2014-03-30 MED ORDER — ARTIFICIAL TEARS OP OINT
TOPICAL_OINTMENT | OPHTHALMIC | Status: AC
  Filled 2014-03-30: qty 3.5

## 2014-03-30 MED ORDER — BUPIVACAINE-EPINEPHRINE (PF) 0.5% -1:200000 IJ SOLN
INTRAMUSCULAR | Status: AC
  Filled 2014-03-30: qty 30

## 2014-03-30 MED ORDER — SODIUM CHLORIDE 0.9 % IJ SOLN
INTRAMUSCULAR | Status: AC
  Filled 2014-03-30: qty 10

## 2014-03-30 MED ORDER — NEOSTIGMINE METHYLSULFATE 10 MG/10ML IV SOLN
INTRAVENOUS | Status: AC
  Filled 2014-03-30: qty 1

## 2014-03-30 MED ORDER — EPHEDRINE SULFATE 50 MG/ML IJ SOLN
INTRAMUSCULAR | Status: AC
  Filled 2014-03-30: qty 1

## 2014-03-30 MED ORDER — SUCCINYLCHOLINE CHLORIDE 20 MG/ML IJ SOLN
INTRAMUSCULAR | Status: AC
  Filled 2014-03-30: qty 1

## 2014-03-30 MED ORDER — TRAMADOL HCL 50 MG PO TABS
50.0000 mg | ORAL_TABLET | Freq: Four times a day (QID) | ORAL | Status: DC | PRN
Start: 2014-03-30 — End: 2014-07-27

## 2014-03-30 MED ORDER — FENTANYL CITRATE 0.05 MG/ML IJ SOLN
INTRAMUSCULAR | Status: DC | PRN
  Administered 2014-03-30: 100 ug via INTRAVENOUS

## 2014-03-30 MED ORDER — MORPHINE SULFATE 2 MG/ML IJ SOLN: 1.0000 mg | INTRAMUSCULAR | Status: DC | PRN

## 2014-03-30 MED ORDER — ONDANSETRON HCL 4 MG/2ML IJ SOLN
INTRAMUSCULAR | Status: DC | PRN
  Administered 2014-03-30: 4 mg via INTRAVENOUS

## 2014-03-30 MED ORDER — ACETAMINOPHEN 650 MG RE SUPP
650.0000 mg | RECTAL | Status: DC | PRN
Start: 2014-03-30 — End: 2014-03-30

## 2014-03-30 MED ORDER — OXYCODONE HCL 5 MG PO TABS: 5.0000 mg | ORAL_TABLET | ORAL | Status: DC | PRN

## 2014-03-30 MED ORDER — PHENYLEPHRINE 40 MCG/ML (10ML) SYRINGE FOR IV PUSH (FOR BLOOD PRESSURE SUPPORT)
PREFILLED_SYRINGE | INTRAVENOUS | Status: AC
  Filled 2014-03-30: qty 10

## 2014-03-30 MED ORDER — BUPIVACAINE-EPINEPHRINE (PF) 0.25% -1:200000 IJ SOLN
INTRAMUSCULAR | Status: AC
  Filled 2014-03-30: qty 30

## 2014-03-30 SURGICAL SUPPLY — 50 items
APL SKNCLS STERI-STRIP NONHPOA (GAUZE/BANDAGES/DRESSINGS) ×1
BENZOIN TINCTURE PRP APPL 2/3 (GAUZE/BANDAGES/DRESSINGS) ×3 IMPLANT
BLADE SURG ROTATE 9660 (MISCELLANEOUS) ×2 IMPLANT
CHLORAPREP W/TINT 26ML (MISCELLANEOUS) ×3 IMPLANT
CLOSURE WOUND 1/2 X4 (GAUZE/BANDAGES/DRESSINGS) ×1
COVER SURGICAL LIGHT HANDLE (MISCELLANEOUS) ×3 IMPLANT
DECANTER SPIKE VIAL GLASS SM (MISCELLANEOUS) ×2 IMPLANT
DRAIN PENROSE 1/2X12 LTX STRL (WOUND CARE) IMPLANT
DRAPE LAPAROTOMY TRNSV 102X78 (DRAPE) ×3 IMPLANT
DRAPE UTILITY 15X26 (DRAPE) ×2 IMPLANT
DRAPE UTILITY 15X26 W/TAPE STR (DRAPE) ×6 IMPLANT
DRESSING TELFA 8X3 (GAUZE/BANDAGES/DRESSINGS) ×3 IMPLANT
DRSG TEGADERM 4X4.75 (GAUZE/BANDAGES/DRESSINGS) ×5 IMPLANT
ELECT CAUTERY BLADE 6.4 (BLADE) ×3 IMPLANT
ELECT REM PT RETURN 9FT ADLT (ELECTROSURGICAL) ×3
ELECTRODE REM PT RTRN 9FT ADLT (ELECTROSURGICAL) ×1 IMPLANT
GLOVE BIO SURGEON STRL SZ 6.5 (GLOVE) ×1 IMPLANT
GLOVE BIO SURGEON STRL SZ7.5 (GLOVE) ×2 IMPLANT
GLOVE BIO SURGEONS STRL SZ 6.5 (GLOVE) ×1
GLOVE BIOGEL PI IND STRL 7.0 (GLOVE) IMPLANT
GLOVE BIOGEL PI INDICATOR 7.0 (GLOVE) ×4
GLOVE SURG SIGNA 7.5 PF LTX (GLOVE) ×3 IMPLANT
GLOVE SURG SS PI 7.0 STRL IVOR (GLOVE) ×2 IMPLANT
GOWN STRL REUS W/ TWL LRG LVL3 (GOWN DISPOSABLE) ×1 IMPLANT
GOWN STRL REUS W/ TWL XL LVL3 (GOWN DISPOSABLE) ×1 IMPLANT
GOWN STRL REUS W/TWL LRG LVL3 (GOWN DISPOSABLE) ×3
GOWN STRL REUS W/TWL XL LVL3 (GOWN DISPOSABLE) ×3
KIT BASIN OR (CUSTOM PROCEDURE TRAY) ×3 IMPLANT
KIT ROOM TURNOVER OR (KITS) ×3 IMPLANT
MESH PARIETEX PROGRIP LEFT (Mesh General) ×2 IMPLANT
MESH PARIETEX PROGRIP RIGHT (Mesh General) ×2 IMPLANT
NDL HYPO 25GX1X1/2 BEV (NEEDLE) ×1 IMPLANT
NEEDLE HYPO 25GX1X1/2 BEV (NEEDLE) ×3 IMPLANT
NS IRRIG 1000ML POUR BTL (IV SOLUTION) ×3 IMPLANT
PACK SURGICAL SETUP 50X90 (CUSTOM PROCEDURE TRAY) ×3 IMPLANT
PAD ARMBOARD 7.5X6 YLW CONV (MISCELLANEOUS) ×6 IMPLANT
PENCIL BUTTON HOLSTER BLD 10FT (ELECTRODE) ×3 IMPLANT
SPECIMEN JAR SMALL (MISCELLANEOUS) IMPLANT
SPONGE LAP 18X18 X RAY DECT (DISPOSABLE) ×3 IMPLANT
STRIP CLOSURE SKIN 1/2X4 (GAUZE/BANDAGES/DRESSINGS) ×2 IMPLANT
SUT MON AB 4-0 PC3 18 (SUTURE) ×5 IMPLANT
SUT SILK 2 0 SH (SUTURE) IMPLANT
SUT VIC AB 2-0 CT1 27 (SUTURE) ×15
SUT VIC AB 2-0 CT1 TAPERPNT 27 (SUTURE) ×2 IMPLANT
SUT VIC AB 3-0 CT1 27 (SUTURE) ×6
SUT VIC AB 3-0 CT1 TAPERPNT 27 (SUTURE) ×1 IMPLANT
SYR CONTROL 10ML LL (SYRINGE) ×3 IMPLANT
TOWEL OR 17X24 6PK STRL BLUE (TOWEL DISPOSABLE) ×3 IMPLANT
TOWEL OR 17X26 10 PK STRL BLUE (TOWEL DISPOSABLE) ×3 IMPLANT
WATER STERILE IRR 1000ML POUR (IV SOLUTION) IMPLANT

## 2014-03-30 NOTE — Progress Notes (Signed)
Called Dr. Conrad [place] for sign out

## 2014-03-30 NOTE — Transfer of Care (Signed)
Immediate Anesthesia Transfer of Care Note  Patient: [name].  Procedure(s) Performed: Procedure(s): OPEN BILATERAL INGUINAL HERNIA REPAIR WITH MESH (Bilateral) INSERTION OF MESH (Bilateral)  Patient Location: PACU  Anesthesia Type:General  Level of Consciousness: awake, alert  and oriented  Airway & Oxygen Therapy: Patient Spontanous Breathing and Patient connected to face mask oxygen  Post-op Assessment: Report given to PACU RN and Post -op Vital signs reviewed and stable  Post vital signs: Reviewed and stable  Complications: No apparent anesthesia complications

## 2014-03-30 NOTE — Op Note (Signed)
NAME:  [name], [name] NO.:  [id number]  MEDICAL RECORD NO.:  [id number]  LOCATION:  [facility]                         FACILITY:  [facility]  PHYSICIAN:  Coralie Keens, M.D. DATE OF BIRTH:  09/19/1940  DATE OF PROCEDURE:  03/30/2014 DATE OF DISCHARGE:                              OPERATIVE REPORT   PREOPERATIVE DIAGNOSIS:  Bilateral inguinal hernias.  POSTOPERATIVE DIAGNOSIS:  Bilateral inguinal hernias.  PROCEDURE:  Bilateral inguinal hernia repair with mesh.  SURGEON:  Coralie Keens, M.D.  ANESTHESIA:  General and 0.5% Marcaine.  ESTIMATED BLOOD LOSS:  Minimal.  FINDINGS:  The patient was found to have a large indirect left inguinal hernia containing colon going down to the scrotum.  He had a large right direct inguinal hernia.  Both were repaired with mesh.  PROCEDURE IN DETAIL:  The patient was brought to the operating room, identified as [name].  He was placed supine on the operating room table and general anesthesia was induced.  His abdomen and groins were then prepped and draped in usual sterile fashion.  I performed a left ilioinguinal nerve block with Marcaine and anesthetized the skin with Marcaine as well.  I made a longitudinal incision with a scalpel took this down to the subcutaneous tissue with electrocautery.  I then divided through Scarpa's fashion and identified the external oblique fascia.  I opened the external oblique fashion toward the internal and external rings.  The testicular cord and structures were then controlled with Penrose drain.  I opened up the chronic thick hernia sac and pulled a large amount of sigmoid colon that had looped down into the scrotum out of the sac.  I then separated the attachments from the colon and into the hernia sac with the electrocautery.  I was then able to reduce the colon back into the abdominal cavity.  I then excised the redundant sac with the electrocautery.  I tied off the base with a 2-0  figure-of- eight 0 Vicryl suture.  Next, I brought a piece of Parietex ProGrip mesh onto the field.  I placed against the pubic tubercle and then brought around the cord structures and laid onto the inguinal floor.  I then sewed it to the pubic tubercle with a 2-0 Vicryl suture.  I then secured it laterally with a suture as well over the large internal ring.  Good coverage of the inguinal floor appeared to be achieved.  I then closed the external oblique fascia over top of this with running 2-0 Vicryl suture.  I closed Scarpa fascia with interrupted 3-0 Vicryl sutures and closed the skin with a running 4-0 Monocryl.  Next, I anesthetized the right inguinal area by first performing a right ilioinguinal nerve block and then injected the skin with Marcaine as well.  I made a longitudinal incision with a scalpel.  I then took this down through Scarpa's fascia with the electrocautery.  The external oblique fascia was then identified and entered toward the internal and external rings.  I then again controlled the testicular cord structures with a Penrose drain. The patient had a large direct hernia defect.  I reduced the sac and then imbricated the floor over the top of  the sac with interrupted 2-0 Vicryl sutures.  Most of the floor of the inguinal canal was gone.  I evaluated the cord structures and found no evidence of indirect inguinal hernia.  I next brought another piece of Parietex Prolene mesh onto the field.  I placed against the pubic tubercle, sewed it in place with 2-0 Vicryl suture then brought around the cord structures recreating the inguinal floor.  Again, good coverage of the inguinal floor and rings appeared to be achieved.  I then closed the external oblique fascia over the top of this with running 2-0 Vicryl suture.  I closed Scarpa fascia with interrupted 3-0 Vicryl sutures and closed the skin with running 4-0 Monocryl.  Steri-Strips, gauze, and Tegaderm were placed at both  sites. The patient tolerated the procedure well.  All the counts were correct at the end of the procedure.  The patient was then extubated in operating room and taken in stable condition to the recovery room.     Coralie Keens, M.D.     DB/MEDQ  D:  03/30/2014  T:  03/30/2014  Job:  [id number]

## 2014-03-30 NOTE — Anesthesia Postprocedure Evaluation (Signed)
Anesthesia Post Note  Patient: [name].  Procedure(s) Performed: Procedure(s) (LRB): OPEN BILATERAL INGUINAL HERNIA REPAIR WITH MESH (Bilateral) INSERTION OF MESH (Bilateral)  Anesthesia type: general  Patient location: PACU  Post pain: Pain level controlled  Post assessment: Patient's Cardiovascular Status Stable  Last Vitals:  Filed Vitals:   03/30/14 1103  BP: 126/50  Pulse: 66  Temp:   Resp: 16    Post vital signs: Reviewed and stable  Level of consciousness: sedated  Complications: No apparent anesthesia complications

## 2014-03-30 NOTE — Progress Notes (Signed)
Dr.Ossey at bedside. Pt doing well. Orders received to have family give pt his home parikinson's meds.

## 2014-03-30 NOTE — Interval H&P Note (Signed)
History and Physical Interval Note: no change in H and P  03/30/2014 6:32 AM  [name].  has presented today for surgery, with the diagnosis of Bilateral Inguinal Hernia  The various methods of treatment have been discussed with the patient and family. After consideration of risks, benefits and other options for treatment, the patient has consented to  Procedure(s): OPEN BILATERAL INGUINAL HERNIA REPAIR WITH MESH (Bilateral) INSERTION OF MESH (Bilateral) as a surgical intervention .  The patient's history has been reviewed, patient examined, no change in status, stable for surgery.  I have reviewed the patient's chart and labs.  Questions were answered to the patient's satisfaction.     [name] A

## 2014-03-30 NOTE — Op Note (Signed)
OPEN BILATERAL INGUINAL HERNIA REPAIR WITH MESH, INSERTION OF MESH  Procedure Note  Lamine Laton. 03/30/2014   Pre-op Diagnosis: Bilateral Inguinal Hernia     Post-op Diagnosis: same  Procedure(s): OPEN BILATERAL INGUINAL HERNIA REPAIR WITH MESH INSERTION OF MESH  Surgeon(s): Coralie Keens, MD  Anesthesia: General  Staff:  Circulator: Denna Haggard, RN Scrub Person: Montel Culver, RN; Jaci Standard, RN  Estimated Blood Loss: Minimal                         Elishua Radford A   Date: 03/30/2014  Time: 8:49 AM

## 2014-03-30 NOTE — Discharge Instructions (Signed)
[facility] _____[facility]  UMBILICAL OR INGUINAL HERNIA REPAIR: POST OP INSTRUCTIONS  Always review your discharge instruction sheet given to you by the facility where your surgery was performed. IF YOU HAVE DISABILITY OR FAMILY LEAVE FORMS, YOU MUST BRING THEM TO THE OFFICE FOR PROCESSING.   DO NOT GIVE THEM TO YOUR DOCTOR.  1. A  prescription for pain medication may be given to you upon discharge.  Take your pain medication as prescribed, if needed.  If narcotic pain medicine is not needed, then you may take acetaminophen (Tylenol) or ibuprofen (Advil) as needed. 2. Take your usually prescribed medications unless otherwise directed. 3. If you need a refill on your pain medication, please contact your pharmacy.  They will contact our office to request authorization. Prescriptions will not be filled after 5 pm or on week-ends. 4. You should follow a light diet the first 24 hours after arrival home, such as soup and crackers, etc.  Be sure to include lots of fluids daily.  Resume your normal diet the day after surgery. 5. Most patients will experience some swelling and bruising around the umbilicus or in the groin and scrotum.  Ice packs and reclining will help.  Swelling and bruising can take several days to resolve.  6. It is common to experience some constipation if taking pain medication after surgery.  Increasing fluid intake and taking a stool softener (such as Colace) will usually help or prevent this problem from occurring.  A mild laxative (Milk of Magnesia or Miralax) should be taken according to package directions if there are no bowel movements after 48 hours. 7. Unless discharge instructions indicate otherwise, you may remove your bandages 24-48 hours after surgery, and you may shower at that time.  You may have steri-strips (small skin tapes) in place directly over the incision.  These strips should be left on the skin for 7-10 days.  If your surgeon used skin glue on the  incision, you may shower in 24 hours.  The glue will flake off over the next 2-3 weeks.  Any sutures or staples will be removed at the office during your follow-up visit. 8. ACTIVITIES:  You may resume regular (light) daily activities beginning the next day--such as daily self-care, walking, climbing stairs--gradually increasing activities as tolerated.  You may have sexual intercourse when it is comfortable.  Refrain from any heavy lifting or straining until approved by your doctor. a. You may drive when you are no longer taking prescription pain medication, you can comfortably wear a seatbelt, and you can safely maneuver your car and apply brakes. b. RETURN TO WORK:  __________________________________________________________ 9. You should see your doctor in the office for a follow-up appointment approximately 2-3 weeks after your surgery.  Make sure that you call for this appointment within a day or two after you arrive home to insure a convenient appointment time. 10. OTHER INSTRUCTIONS:  NO LIFTING MORE THAN 15 POUNDS FOR 4 WEEKS. 11. ICE PACK ALSO FOR  PAIN  OK TO SHOWER STARTING TOMORROW_________________________  WHEN TO CALL YOUR DOCTOR: 1. Fever over 101.0 2. Inability to urinate 3. Nausea and/or vomiting 4. Extreme swelling or bruising 5. Continued bleeding from incision. 6. Increased pain, redness, or drainage from the incision  The clinic staff is available to answer your questions during regular business hours.  Please dont hesitate to call and ask to speak to one of the nurses for clinical concerns.  If you have a medical emergency, go to the nearest  emergency room or call 911.  A surgeon from [facility] is always on call at the hospital   [address], [place], [place], [place]  [address] ?  [address], [place], [place]   [address] [phone] ? [phone] ? FAX [phone] Web site: [email or web address]

## 2014-03-30 NOTE — Anesthesia Preprocedure Evaluation (Addendum)
Anesthesia Evaluation  Patient identified by MRN, date of birth, ID band Patient awake    Reviewed: Allergy & Precautions, H&P , NPO status , Patient's Chart, lab work & pertinent test results, reviewed documented beta blocker date and time   History of Anesthesia Complications (+) PONV and history of anesthetic complications  Airway Mallampati: II  TM Distance: >3 FB Neck ROM: Full    Dental   Pulmonary          Cardiovascular + Valvular Problems/Murmurs AS     Neuro/Psych  Headaches, PSYCHIATRIC DISORDERS Anxiety Depression TIA   GI/Hepatic GERD-  Medicated and Controlled,  Endo/Other    Renal/GU CRFRenal disease     Musculoskeletal  (+) Arthritis -,   Abdominal   Peds  Hematology   Anesthesia Other Findings + CAD (mild, non-obstructive ASCADz by cath 5/15) Rhythm:Regular Rate:Normal 5/15 ECHO: EF 65-70% with grade 1 diastolic dysfunction, severe AS with AVA 0.6 cm2, Mean gradient: 47mm Hg (S). Peak gradient: 14mm Hg (S). S/P TAVR TF  Reproductive/Obstetrics                            Anesthesia Physical Anesthesia Plan  ASA: III  Anesthesia Plan: General   Post-op Pain Management:    Induction: Intravenous  Airway Management Planned: LMA  Additional Equipment:   Intra-op Plan:   Post-operative Plan: Extubation in OR  Informed Consent: I have reviewed the patients History and Physical, chart, labs and discussed the procedure including the risks, benefits and alternatives for the proposed anesthesia with the patient or authorized representative who has indicated his/her understanding and acceptance.   Dental advisory given  Plan Discussed with: CRNA and Surgeon  Anesthesia Plan Comments:        Anesthesia Quick Evaluation

## 2014-03-31 ENCOUNTER — Encounter (HOSPITAL_COMMUNITY): Payer: Self-pay | Admitting: Surgery

## 2014-04-23 ENCOUNTER — Encounter (HOSPITAL_COMMUNITY): Payer: Self-pay | Admitting: Cardiovascular Disease

## 2014-05-19 DIAGNOSIS — Z1389 Encounter for screening for other disorder: Secondary | ICD-10-CM | POA: Diagnosis not present

## 2014-05-19 DIAGNOSIS — R351 Nocturia: Secondary | ICD-10-CM | POA: Diagnosis not present

## 2014-05-19 DIAGNOSIS — R634 Abnormal weight loss: Secondary | ICD-10-CM | POA: Diagnosis not present

## 2014-05-19 DIAGNOSIS — I359 Nonrheumatic aortic valve disorder, unspecified: Secondary | ICD-10-CM | POA: Diagnosis not present

## 2014-05-19 DIAGNOSIS — K4090 Unilateral inguinal hernia, without obstruction or gangrene, not specified as recurrent: Secondary | ICD-10-CM | POA: Diagnosis not present

## 2014-05-19 DIAGNOSIS — G20 Parkinson's disease: Secondary | ICD-10-CM | POA: Diagnosis not present

## 2014-06-08 ENCOUNTER — Other Ambulatory Visit: Payer: Self-pay | Admitting: Physician Assistant

## 2014-07-14 DIAGNOSIS — L821 Other seborrheic keratosis: Secondary | ICD-10-CM | POA: Diagnosis not present

## 2014-07-14 DIAGNOSIS — L814 Other melanin hyperpigmentation: Secondary | ICD-10-CM | POA: Diagnosis not present

## 2014-07-14 DIAGNOSIS — D485 Neoplasm of uncertain behavior of skin: Secondary | ICD-10-CM | POA: Diagnosis not present

## 2014-07-14 DIAGNOSIS — C4441 Basal cell carcinoma of skin of scalp and neck: Secondary | ICD-10-CM | POA: Diagnosis not present

## 2014-07-14 DIAGNOSIS — L570 Actinic keratosis: Secondary | ICD-10-CM | POA: Diagnosis not present

## 2014-07-14 DIAGNOSIS — D225 Melanocytic nevi of trunk: Secondary | ICD-10-CM | POA: Diagnosis not present

## 2014-07-20 ENCOUNTER — Ambulatory Visit: Payer: Medicare Other | Admitting: Cardiology

## 2014-07-27 ENCOUNTER — Encounter: Payer: Self-pay | Admitting: Cardiology

## 2014-07-27 ENCOUNTER — Ambulatory Visit (INDEPENDENT_AMBULATORY_CARE_PROVIDER_SITE_OTHER): Payer: Medicare Other | Admitting: Cardiology

## 2014-07-27 VITALS — BP 110/58 | HR 54 | Ht 65.5 in | Wt 140.0 lb

## 2014-07-27 DIAGNOSIS — G20 Parkinson's disease: Secondary | ICD-10-CM

## 2014-07-27 DIAGNOSIS — Z952 Presence of prosthetic heart valve: Secondary | ICD-10-CM

## 2014-07-27 DIAGNOSIS — Z954 Presence of other heart-valve replacement: Secondary | ICD-10-CM

## 2014-07-27 NOTE — Patient Instructions (Signed)
The current medical regimen is effective;  continue present plan and medications.  Follow up in 6 months with Dr. Skains.  You will receive a letter in the mail 2 months before you are due.  Please call us when you receive this letter to schedule your follow up appointment.  Thank you for choosing  [facility]!!     

## 2014-07-27 NOTE — Progress Notes (Signed)
[facility]. [address] [place], [place]  [address] Phone: [phone] Fax:  [phone]  Date:  07/27/2014   ID:  [name], DOB [birth year], MRN [id number]  PCP:  Dorian Heckle, MD   History of Present Illness: [name]. is a 74 y.o. male with Parkinson's disease status post transcatheter aortic valve replacement #26 mm Edwards's AP and valve on 11/18/13 secondary to severe aortic stenosis.  He is doing very well. Has mild to moderate perivalvular leak. Overall normal ejection fraction. Feeling well.   He does state that he feels some mild tingling along his inner thigh in the morning hours on right leg. He has noted this since TAVR.  Also asked about upper chest wall discomfort at times during stress. Catheterization showed essentially normal coronary arteries.  Wt Readings from Last 3 Encounters:  07/27/14 140 lb (63.504 kg)  03/30/14 145 lb (65.772 kg)  03/20/14 144 lb (65.318 kg)     Past Medical History  Diagnosis Date  . Kidney stones   . Parkinson disease   . GERD (gastroesophageal reflux disease)   . Aortic stenosis   . Anxiety   . Headache(784.0)   . TIA (transient ischemic attack) 02/08/2007  . Parkinson's disease (tremor, stiffness, slow motion, unstable posture) 12/11/2006  . PONV (postoperative nausea and vomiting)   . Heart murmur   . Hernia, inguinal, bilateral, recurrent   . Restless leg syndrome   . S/P TAVR (transcatheter aortic valve replacement) 11/18/2013    26 mm Edwards Sapien XT transcatheter heart valve placed via open right transfemoral approach  . Depression   . Cancer     basal cell- facial?  . Arthritis     left hip    Past Surgical History  Procedure Laterality Date  . Cataract  2008    bilateral  . Cystoscopy/retrograde/ureteroscopy  05/29/2011    Procedure: CYSTOSCOPY/RETROGRADE/URETEROSCOPY;  Surgeon: Malka So, MD;  Location: [facility];  Service: Urology;  Laterality: Left;  no stent   . Colonoscopy     . Transcatheter aortic valve replacement, transfemoral N/A 11/18/2013    Procedure: TRANSCATHETER AORTIC VALVE REPLACEMENT, TRANSFEMORAL;  Surgeon: Sherren Mocha, MD;  Location: [facility];  Service: Open Heart Surgery;  Laterality: N/A;  . Intraoperative transesophageal echocardiogram N/A 11/18/2013    Procedure: INTRAOPERATIVE TRANSESOPHAGEAL ECHOCARDIOGRAM;  Surgeon: Sherren Mocha, MD;  Location[facility] OR;  Service: Open Heart Surgery;  Laterality: N/A;  . Eye surgery Bilateral     /w IOL  . Cardiac catheterization  10/09/13  . Inguinal hernia repair Bilateral 03/30/2014    Procedure: OPEN BILATERAL INGUINAL HERNIA REPAIR WITH MESH;  Surgeon: Coralie Keens, MD;  Location: [facility];  Service: General;  Laterality: Bilateral;  . Insertion of mesh Bilateral 03/30/2014    Procedure: INSERTION OF MESH;  Surgeon: Coralie Keens, MD;  Location: [facility];  Service: General;  Laterality: Bilateral;  . Left and right heart catheterization with coronary angiogram N/A 10/09/2013    Procedure: LEFT AND RIGHT HEART CATHETERIZATION WITH CORONARY ANGIOGRAM;  Surgeon: Blane Ohara, MD;  Location: [facility] CATH LAB;  Service: Cardiovascular;  Laterality: N/A;    Current Outpatient Prescriptions  Medication Sig Dispense Refill  . acetaminophen (TYLENOL) 500 MG tablet Take 1,000 mg by mouth 2 (two) times daily as needed (pain). For pain    . aspirin EC 81 MG tablet Take by mouth.    . carbidopa-levodopa (SINEMET IR) 25-100 MG per tablet Take 1.5  tablets by mouth See admin instructions. Take 1 and 1/2 tablets at 10am, 2pm and 6pm.    . cholecalciferol (VITAMIN D) 1000 UNITS tablet Take 1,000 Units by mouth at bedtime.     . clonazePAM (KLONOPIN) 1 MG tablet Take 1.5 mg by mouth at bedtime.     . donepezil (ARICEPT) 10 MG tablet Take 5 mg by mouth at bedtime. Increase dose to 10 mg. Daily starting 03/30/2014    . LORazepam (ATIVAN) 0.5 MG tablet Take 0.5 mg by mouth daily.     . metoprolol tartrate (LOPRESSOR) 25 MG  tablet Take 0.5 tablets (12.5 mg total) by mouth 2 (two) times daily. 90 tablet 3  . omeprazole (PRILOSEC) 20 MG capsule Take 20 mg by mouth at bedtime.     . sertraline (ZOLOFT) 50 MG tablet Take 50 mg by mouth daily before breakfast.     . traMADol (ULTRAM) 50 MG tablet Take 50 mg by mouth daily as needed (pain).      No current facility-administered medications for this visit.    Allergies:    Allergies  Allergen Reactions  . Penicillins     Unsure of reaction  . Vicodin [Hydrocodone-Acetaminophen] Other (See Comments)    "Mental reaction"    Social History:  The patient  reports that he has never smoked. He has never used smokeless tobacco. He reports that he does not drink alcohol or use illicit drugs.   Family History  Problem Relation Age of Onset  . Anemia Daughter   . Asthma Daughter   . Cancer Father   . Diabetes Daughter   . Hypertension Daughter   . Kidney disease Daughter   . Cancer Son     ROS:  Please see the history of present illness.   Denies any fevers, chills, orthopnea, PND   question of fatigue, chest pressure. All other systems reviewed and negative.   PHYSICAL EXAM: VS:  BP 110/58 mmHg  Pulse 54  Ht 5' 5.5" (1.664 m)  Wt 140 lb (63.504 kg)  BMI 22.93 kg/m2  SpO2 98% Well nourished, well developed, in no acute distress HEENT: normal, [place]/AT, EOMI Neck: no JVD, normal carotid upstroke, no bruit Cardiac:  normal S1, S2; RRR; no murmur Lungs:  clear to auscultation bilaterally, no wheezing, rhonchi or rales Abd: soft, nontender, no hepatomegaly, no bruits Ext: no edema, 2+ distal pulses Skin: warm and dry GU: deferred Neuro: no focal abnormalities noted, AAO x 3  EKG:  None today     ASSESSMENT AND PLAN:  1. History of aortic valve replacement via TAVR - doing very well, mild to moderate perivalvular leak. Echocardiogram reviewed. Explained to him. Asymptomatic. He should continue with his aspirin 81 mg. We will continue with low-dose  metoprolol. He is doing very well. Occasional upper chest sensation likely stress related. Reviewed cardiac catheterization which showed normal/no flow-limiting coronary artery disease 2. Parkinson's disease-neurology. He does note right inner thigh tingling usually in the morning hours. He was wondering if this was result of theTAVR. Discussed that this may be possibility from scar tissue in the general region. He has excellent blood flow. Hernia repair also occurred. He thought that the tingling occurred prior to the hernia however.  3. Reminded him of dental prophylaxis. 4. We will send neurology at [facility] notes. Dr. Charise Killian. 5. We will see him back in 6 months.  Signed, Candee Furbish, MD [facility]  07/27/2014 2:04 PM

## 2014-09-21 DIAGNOSIS — R634 Abnormal weight loss: Secondary | ICD-10-CM | POA: Diagnosis not present

## 2014-09-21 DIAGNOSIS — G20 Parkinson's disease: Secondary | ICD-10-CM | POA: Diagnosis not present

## 2014-09-21 DIAGNOSIS — H6121 Impacted cerumen, right ear: Secondary | ICD-10-CM | POA: Diagnosis not present

## 2014-10-14 DIAGNOSIS — Z85828 Personal history of other malignant neoplasm of skin: Secondary | ICD-10-CM | POA: Diagnosis not present

## 2014-10-14 DIAGNOSIS — L820 Inflamed seborrheic keratosis: Secondary | ICD-10-CM | POA: Diagnosis not present

## 2014-10-14 DIAGNOSIS — Z08 Encounter for follow-up examination after completed treatment for malignant neoplasm: Secondary | ICD-10-CM | POA: Diagnosis not present

## 2014-12-10 ENCOUNTER — Ambulatory Visit (HOSPITAL_COMMUNITY): Payer: Medicare Other | Attending: Cardiology

## 2014-12-10 ENCOUNTER — Other Ambulatory Visit: Payer: Self-pay

## 2014-12-10 ENCOUNTER — Other Ambulatory Visit: Payer: Self-pay | Admitting: Cardiovascular Disease

## 2014-12-10 ENCOUNTER — Other Ambulatory Visit (HOSPITAL_COMMUNITY): Payer: Self-pay | Admitting: Cardiovascular Disease

## 2014-12-10 ENCOUNTER — Encounter: Payer: Self-pay | Admitting: Cardiovascular Disease

## 2014-12-10 ENCOUNTER — Ambulatory Visit (INDEPENDENT_AMBULATORY_CARE_PROVIDER_SITE_OTHER): Payer: Medicare Other | Admitting: Cardiovascular Disease

## 2014-12-10 VITALS — Ht 65.5 in | Wt 137.0 lb

## 2014-12-10 DIAGNOSIS — Z954 Presence of other heart-valve replacement: Secondary | ICD-10-CM

## 2014-12-10 DIAGNOSIS — I359 Nonrheumatic aortic valve disorder, unspecified: Secondary | ICD-10-CM

## 2014-12-10 DIAGNOSIS — Z952 Presence of prosthetic heart valve: Secondary | ICD-10-CM | POA: Diagnosis not present

## 2014-12-10 DIAGNOSIS — I351 Nonrheumatic aortic (valve) insufficiency: Secondary | ICD-10-CM | POA: Diagnosis not present

## 2014-12-10 DIAGNOSIS — Z953 Presence of xenogenic heart valve: Secondary | ICD-10-CM

## 2014-12-10 DIAGNOSIS — I253 Aneurysm of heart: Secondary | ICD-10-CM | POA: Insufficient documentation

## 2014-12-10 DIAGNOSIS — I059 Rheumatic mitral valve disease, unspecified: Secondary | ICD-10-CM | POA: Diagnosis not present

## 2014-12-10 NOTE — Patient Instructions (Signed)
Medication Instructions:  Your physician recommends that you continue on your current medications as directed. Please refer to the Current Medication list given to you today.  Labwork: No new orders.   Testing/Procedures: No new orders.   Follow-Up: Your physician recommends that you continue Cardiology follow-up appointment with Dr Marlou Porch.   Any Other Special Instructions Will Be Listed Below (If Applicable).

## 2014-12-10 NOTE — Progress Notes (Signed)
Cardiology Office Note Date:  12/10/2014   ID:  [name]., DOB 11/06/1940, MRN [id number]  PCP:  Dorian Heckle, MD  Cardiologist:  Sherren Mocha, MD    Chief Complaint  Patient presents with  . Fatigue    History of Present Illness: [name]. is a 74 y.o. male who presents for one year follow-up after TAVR. He was treated with a Sapien XT valve using an open transfemoral approach in July 2015. Since that time he is doing well without symptoms of chest pain, shortness of breath, or lightheadedness. He is limited by Parkinson's disease. No other complaints. He stopped plavix 6 months after TAVR as recommended by current guidelines.   Past Medical History  Diagnosis Date  . Kidney stones   . Parkinson disease   . GERD (gastroesophageal reflux disease)   . Aortic stenosis   . Anxiety   . Headache(784.0)   . TIA (transient ischemic attack) 02/08/2007  . Parkinson's disease (tremor, stiffness, slow motion, unstable posture) 12/11/2006  . PONV (postoperative nausea and vomiting)   . Heart murmur   . Hernia, inguinal, bilateral, recurrent   . Restless leg syndrome   . S/P TAVR (transcatheter aortic valve replacement) 11/18/2013    26 mm Edwards Sapien XT transcatheter heart valve placed via open right transfemoral approach  . Depression   . Cancer     basal cell- facial?  . Arthritis     left hip    Past Surgical History  Procedure Laterality Date  . Cataract  2008    bilateral  . Cystoscopy/retrograde/ureteroscopy  05/29/2011    Procedure: CYSTOSCOPY/RETROGRADE/URETEROSCOPY;  Surgeon: Malka So, MD;  Location: [facility];  Service: Urology;  Laterality: Left;  no stent   . Colonoscopy    . Transcatheter aortic valve replacement, transfemoral N/A 11/18/2013    Procedure: TRANSCATHETER AORTIC VALVE REPLACEMENT, TRANSFEMORAL;  Surgeon: Sherren Mocha, MD;  Location: [facility];  Service: Open Heart Surgery;  Laterality: N/A;  . Intraoperative transesophageal  echocardiogram N/A 11/18/2013    Procedure: INTRAOPERATIVE TRANSESOPHAGEAL ECHOCARDIOGRAM;  Surgeon: Sherren Mocha, MD;  Location[facility] OR;  Service: Open Heart Surgery;  Laterality: N/A;  . Eye surgery Bilateral     /w IOL  . Cardiac catheterization  10/09/13  . Inguinal hernia repair Bilateral 03/30/2014    Procedure: OPEN BILATERAL INGUINAL HERNIA REPAIR WITH MESH;  Surgeon: Coralie Keens, MD;  Location: [facility];  Service: General;  Laterality: Bilateral;  . Insertion of mesh Bilateral 03/30/2014    Procedure: INSERTION OF MESH;  Surgeon: Coralie Keens, MD;  Location: [facility];  Service: General;  Laterality: Bilateral;  . Left and right heart catheterization with coronary angiogram N/A 10/09/2013    Procedure: LEFT AND RIGHT HEART CATHETERIZATION WITH CORONARY ANGIOGRAM;  Surgeon: Blane Ohara, MD;  Location: [facility] CATH LAB;  Service: Cardiovascular;  Laterality: N/A;    Current Outpatient Prescriptions  Medication Sig Dispense Refill  . acetaminophen (TYLENOL) 500 MG tablet Take 1,000 mg by mouth 2 (two) times daily as needed (pain). For pain    . aspirin EC 81 MG tablet Take by mouth.    . carbidopa-levodopa (SINEMET IR) 25-100 MG per tablet Take 1.5 tablets by mouth See admin instructions. Take 1 and 1/2 tablets at 10am, 2pm and 6pm.    . cholecalciferol (VITAMIN D) 1000 UNITS tablet Take 1,000 Units by mouth at bedtime.     . clonazePAM (KLONOPIN) 1 MG tablet Take 1.5 mg by mouth at bedtime.     Marland Kitchen  donepezil (ARICEPT) 10 MG tablet Take 5 mg by mouth at bedtime. Increase dose to 10 mg. Daily starting 03/30/2014    . LORazepam (ATIVAN) 0.5 MG tablet Take 0.5 mg by mouth daily.     . metoprolol tartrate (LOPRESSOR) 25 MG tablet Take 0.5 tablets (12.5 mg total) by mouth 2 (two) times daily. 90 tablet 3  . omeprazole (PRILOSEC) 20 MG capsule Take 20 mg by mouth at bedtime.     . sertraline (ZOLOFT) 50 MG tablet Take 50 mg by mouth daily before breakfast.      No current facility-administered  medications for this visit.    Allergies:   Penicillins and Vicodin   Social History:  The patient  reports that he has never smoked. He has never used smokeless tobacco. He reports that he does not drink alcohol or use illicit drugs.   Family History:  The patient's family history includes Anemia in his daughter; Asthma in his daughter; Cancer in his father and son; Diabetes in his daughter; Hypertension in his daughter; Kidney disease in his daughter. There is no history of Heart attack or Stroke.    ROS:  Please see the history of present illness.  Otherwise, review of systems is positive for leg weakness, hoarse voice.  All other systems are reviewed and negative.    PHYSICAL EXAM: VS:  Ht 5' 5.5" (1.664 m)  Wt 137 lb (62.143 kg)  BMI 22.44 kg/m2 , BMI Body mass index is 22.44 kg/(m^2). GEN: Elderly pleasant male, in no acute distress HEENT: normal Neck: no JVD, no masses. No carotid bruits Cardiac: RRR with soft systolic murmur at the RUSB< no diastolic murmur             Respiratory:  clear to auscultation bilaterally, normal work of breathing GI: soft, nontender, nondistended, + BS MS: no deformity or atrophy Ext: no pretibial edema, pedal pulses 2+= bilaterally Skin: warm and dry, no rash Neuro:  Strength and sensation are intact Psych: euthymic mood, full affect  EKG:  EKG is ordered today. The ekg ordered today shows Sinus brady 58 bpm, otherwise WNL  Recent Labs: 03/23/2014: BUN 18; Creatinine, Ser 0.80; Hemoglobin 13.5; Platelets 113*; Potassium 4.4; Sodium 141   Lipid Panel     Component Value Date/Time   CHOL  02/09/2007 0409    130        ATP III CLASSIFICATION:  <200     mg/dL   Desirable  200-239  mg/dL   Borderline High  >=240    mg/dL   High   TRIG 66 02/09/2007 0409   HDL 32* 02/09/2007 0409   CHOLHDL 4.1 02/09/2007 0409   VLDL 13 02/09/2007 0409   LDLCALC  02/09/2007 0409    85        Total Cholesterol/HDL:CHD Risk Coronary Heart Disease Risk  Table                     Men   Women  1/2 Average Risk   3.4   3.3      Wt Readings from Last 3 Encounters:  12/10/14 137 lb (62.143 kg)  07/27/14 140 lb (63.504 kg)  03/30/14 145 lb (65.772 kg)     Cardiac Studies Reviewed: 2D Echo 12/10/2014: Study Conclusions  - Left ventricle: The cavity size was normal. Wall thickness was normal. Systolic function was normal. The estimated ejection fraction was in the range of 60% to 65%. Wall motion was normal; there were no regional wall  motion abnormalities. Doppler parameters are consistent with abnormal left ventricular relaxation (grade 1 diastolic dysfunction). - Aortic valve: A bioprosthesis was present. There was mild regurgitation. - Mitral valve: Calcified annulus. - Left atrium: The atrium was moderately dilated. - Right atrium: The atrium was mildly dilated. - Atrial septum: There was an atrial septal aneurysm. - Pulmonary arteries: Systolic pressure was mildly increased.  Impressions:  - Normal LV function with grade 1 diastolic dysfunction; moderate LAE; mild RAE; s/p TAVR with what appears to be 2 sites of mild periprosthetic AI; normal mean gradient of 14 mmHg; mild TR with mildly elevated pulmonary pressures. Compared to 12/12/13, AI appears to be unchanged.  ASSESSMENT AND PLAN: Aortic Valve Stenosis s/p TAVR: echo personally reviewed and shows normal valve function, mild paravalvular AI, and normal LV function. Pt with NYHA Class I symptoms, primarily secondary to functional limitation from Parkinson's Disease. He will continue to follow with Dr Marlou Porch. He understands the need for SBE prophylaxis which was reviewed with the patient and his daughter today. I will see him back as needed.    Current medicines are reviewed with the patient today.  The patient does not have concerns regarding medicines.  Labs/ tests ordered today include:  No orders of the defined types were placed in this  encounter.    Disposition:   FU as needed  Signed, Sherren Mocha, MD  12/10/2014 5:03 PM    [facility] [address], [place], [place]  [address] Phone: [phone]; Fax: [phone]

## 2015-01-25 DIAGNOSIS — H524 Presbyopia: Secondary | ICD-10-CM | POA: Diagnosis not present

## 2015-01-25 DIAGNOSIS — H35363 Drusen (degenerative) of macula, bilateral: Secondary | ICD-10-CM | POA: Diagnosis not present

## 2015-01-25 DIAGNOSIS — H26493 Other secondary cataract, bilateral: Secondary | ICD-10-CM | POA: Diagnosis not present

## 2015-01-25 DIAGNOSIS — Z961 Presence of intraocular lens: Secondary | ICD-10-CM | POA: Diagnosis not present

## 2015-01-25 DIAGNOSIS — H04203 Unspecified epiphora, bilateral lacrimal glands: Secondary | ICD-10-CM | POA: Diagnosis not present

## 2015-02-23 ENCOUNTER — Other Ambulatory Visit: Payer: Self-pay | Admitting: Cardiology

## 2015-03-03 DIAGNOSIS — G20 Parkinson's disease: Secondary | ICD-10-CM | POA: Diagnosis not present

## 2015-04-22 DIAGNOSIS — Z23 Encounter for immunization: Secondary | ICD-10-CM | POA: Diagnosis not present

## 2015-05-24 ENCOUNTER — Other Ambulatory Visit: Payer: Self-pay | Admitting: Cardiology

## 2015-07-01 ENCOUNTER — Ambulatory Visit (INDEPENDENT_AMBULATORY_CARE_PROVIDER_SITE_OTHER): Payer: Medicare Other | Admitting: Cardiology

## 2015-07-01 ENCOUNTER — Encounter: Payer: Self-pay | Admitting: Cardiology

## 2015-07-01 VITALS — BP 110/62 | HR 54 | Ht 65.5 in | Wt 135.0 lb

## 2015-07-01 DIAGNOSIS — G20 Parkinson's disease: Secondary | ICD-10-CM | POA: Diagnosis not present

## 2015-07-01 DIAGNOSIS — I350 Nonrheumatic aortic (valve) stenosis: Secondary | ICD-10-CM | POA: Diagnosis not present

## 2015-07-01 DIAGNOSIS — Z954 Presence of other heart-valve replacement: Secondary | ICD-10-CM

## 2015-07-01 DIAGNOSIS — Z952 Presence of prosthetic heart valve: Secondary | ICD-10-CM

## 2015-07-01 DIAGNOSIS — I359 Nonrheumatic aortic valve disorder, unspecified: Secondary | ICD-10-CM

## 2015-07-01 NOTE — Patient Instructions (Signed)

## 2015-07-01 NOTE — Progress Notes (Signed)
[facility] [address] [place], [place]  [address] Phone: [phone] Fax:  [phone]  Date:  07/01/2015   ID:  [name]., DOB Oct 23, 1940, MRN [id number]  PCP:  Dorian Heckle, MD   History of Present Illness: [name]. is a 75 y.o. male with Parkinson's disease status post transcatheter aortic valve replacement #26 mm Edwards's AP and valve on 11/18/13 secondary to severe aortic stenosis.  He is doing very well. Has mild to moderate perivalvular leak. Overall normal ejection fraction. Feeling well. Overall he is doing well without any syncope, no chest pain, no shortness of breath.   Catheterization showed essentially normal coronary arteries.  His Parkinson's has progressed. More difficult to vocalize.  Wt Readings from Last 3 Encounters:  07/01/15 135 lb (61.236 kg)  12/10/14 137 lb (62.143 kg)  07/27/14 140 lb (63.504 kg)     Past Medical History  Diagnosis Date  . Kidney stones   . Parkinson disease ([place])   . GERD (gastroesophageal reflux disease)   . Aortic stenosis   . Anxiety   . Headache(784.0)   . TIA (transient ischemic attack) 02/08/2007  . Parkinson's disease (tremor, stiffness, slow motion, unstable posture) ([place]) 12/11/2006  . PONV (postoperative nausea and vomiting)   . Heart murmur   . Hernia, inguinal, bilateral, recurrent   . Restless leg syndrome   . S/P TAVR (transcatheter aortic valve replacement) 11/18/2013    26 mm Edwards Sapien XT transcatheter heart valve placed via open right transfemoral approach  . Depression   . Cancer ([place])     basal cell- facial?  . Arthritis     left hip    Past Surgical History  Procedure Laterality Date  . Cataract  2008    bilateral  . Cystoscopy/retrograde/ureteroscopy  05/29/2011    Procedure: CYSTOSCOPY/RETROGRADE/URETEROSCOPY;  Surgeon: Malka So, MD;  Location: [facility];  Service: Urology;  Laterality: Left;  no stent   . Colonoscopy    . Transcatheter aortic valve  replacement, transfemoral N/A 11/18/2013    Procedure: TRANSCATHETER AORTIC VALVE REPLACEMENT, TRANSFEMORAL;  Surgeon: Sherren Mocha, MD;  Location: [facility];  Service: Open Heart Surgery;  Laterality: N/A;  . Intraoperative transesophageal echocardiogram N/A 11/18/2013    Procedure: INTRAOPERATIVE TRANSESOPHAGEAL ECHOCARDIOGRAM;  Surgeon: Sherren Mocha, MD;  Location: [facility] OR;  Service: Open Heart Surgery;  Laterality: N/A;  . Eye surgery Bilateral     /w IOL  . Cardiac catheterization  10/09/13  . Inguinal hernia repair Bilateral 03/30/2014    Procedure: OPEN BILATERAL INGUINAL HERNIA REPAIR WITH MESH;  Surgeon: Coralie Keens, MD;  Location: [facility];  Service: General;  Laterality: Bilateral;  . Insertion of mesh Bilateral 03/30/2014    Procedure: INSERTION OF MESH;  Surgeon: Coralie Keens, MD;  Location: [facility];  Service: General;  Laterality: Bilateral;  . Left and right heart catheterization with coronary angiogram N/A 10/09/2013    Procedure: LEFT AND RIGHT HEART CATHETERIZATION WITH CORONARY ANGIOGRAM;  Surgeon: Blane Ohara, MD;  Location: [facility] CATH LAB;  Service: Cardiovascular;  Laterality: N/A;    Current Outpatient Prescriptions  Medication Sig Dispense Refill  . acetaminophen (TYLENOL) 500 MG tablet Take 1,000 mg by mouth 2 (two) times daily as needed (pain). For pain    . aspirin EC 81 MG tablet Take by mouth.    . carbidopa-levodopa (SINEMET IR) 25-100 MG per tablet Take 1.5 tablets by mouth See admin instructions. Take 1 and 1/2 tablets  at 10am, 2pm and 6pm.    . cholecalciferol (VITAMIN D) 1000 UNITS tablet Take 1,000 Units by mouth at bedtime.     . clonazePAM (KLONOPIN) 1 MG tablet Take 1.5 mg by mouth at bedtime.     Marland Kitchen LORazepam (ATIVAN) 0.5 MG tablet Take 0.5 mg by mouth daily.     . metoprolol tartrate (LOPRESSOR) 25 MG tablet TAKE 1/2 TABLET BY MOUTH 2 TIMES DAILY 90 tablet 0  . omeprazole (PRILOSEC) 20 MG capsule Take 20 mg by mouth at bedtime.     . donepezil (ARICEPT)  10 MG tablet Take 5 mg by mouth at bedtime. Increase dose to 10 mg. Daily starting 03/30/2014     No current facility-administered medications for this visit.    Allergies:    Allergies  Allergen Reactions  . Penicillins     Unsure of reaction  . Vicodin [Hydrocodone-Acetaminophen] Other (See Comments)    "Mental reaction"    Social History:  The patient  reports that he has never smoked. He has never used smokeless tobacco. He reports that he does not drink alcohol or use illicit drugs.   Family History  Problem Relation Age of Onset  . Anemia Daughter   . Asthma Daughter   . Cancer Father   . Diabetes Daughter   . Hypertension Daughter   . Kidney disease Daughter   . Cancer Son   . Heart attack Neg Hx   . Stroke Neg Hx     ROS:  Please see the history of present illness.   Denies any fevers, chills, orthopnea, PND   question of fatigue, chest pressure. All other systems reviewed and negative.   PHYSICAL EXAM: VS:  BP 110/62 mmHg  Pulse 54  Ht 5' 5.5" (1.664 m)  Wt 135 lb (61.236 kg)  BMI 22.12 kg/m2 Well nourished, well developed, in no acute distress HEENT: normal, [place]/AT, EOMI Neck: no JVD, normal carotid upstroke, no bruit Cardiac:  normal S1, S2; RRR; soft systolic murmur Lungs:  clear to auscultation bilaterally, no wheezing, rhonchi or rales Abd: soft, nontender, no hepatomegaly, no bruits Ext: no edema, 2+ distal pulses Skin: warm and dry GU: deferred Neuro: Parkinsonian signs, AAO x 3  EKG:  None today     ASSESSMENT AND PLAN:  1. History of aortic valve replacement via TAVR - doing very well, mild to moderate perivalvular leak. Echocardiogram reviewed. Explained to him. Asymptomatic. He should continue with his aspirin 81 mg. We will continue with low-dose metoprolol. He is doing very well. Reviewed cardiac catheterization which showed normal/no flow-limiting coronary artery disease 2. Parkinson's disease-neurology.   3. Reminded him of dental  prophylaxis. 4. We will send neurology at [facility] notes. Dr. Charise Killian. 5. We will see him back in 12 months.  Signed, Candee Furbish, MD [facility]  07/01/2015 2:49 PM

## 2015-07-07 DIAGNOSIS — D696 Thrombocytopenia, unspecified: Secondary | ICD-10-CM | POA: Diagnosis not present

## 2015-07-07 DIAGNOSIS — G2581 Restless legs syndrome: Secondary | ICD-10-CM | POA: Diagnosis not present

## 2015-07-07 DIAGNOSIS — R197 Diarrhea, unspecified: Secondary | ICD-10-CM | POA: Diagnosis not present

## 2015-07-07 DIAGNOSIS — G20 Parkinson's disease: Secondary | ICD-10-CM | POA: Diagnosis not present

## 2015-07-07 DIAGNOSIS — Z952 Presence of prosthetic heart valve: Secondary | ICD-10-CM | POA: Diagnosis not present

## 2015-07-07 DIAGNOSIS — F418 Other specified anxiety disorders: Secondary | ICD-10-CM | POA: Diagnosis not present

## 2015-08-31 DIAGNOSIS — D235 Other benign neoplasm of skin of trunk: Secondary | ICD-10-CM | POA: Diagnosis not present

## 2015-08-31 DIAGNOSIS — D1801 Hemangioma of skin and subcutaneous tissue: Secondary | ICD-10-CM | POA: Diagnosis not present

## 2015-08-31 DIAGNOSIS — L821 Other seborrheic keratosis: Secondary | ICD-10-CM | POA: Diagnosis not present

## 2015-08-31 DIAGNOSIS — L570 Actinic keratosis: Secondary | ICD-10-CM | POA: Diagnosis not present

## 2015-09-07 ENCOUNTER — Other Ambulatory Visit: Payer: Self-pay | Admitting: Cardiology

## 2015-11-15 ENCOUNTER — Encounter: Payer: Self-pay | Admitting: Neurology

## 2015-12-16 ENCOUNTER — Ambulatory Visit (INDEPENDENT_AMBULATORY_CARE_PROVIDER_SITE_OTHER): Payer: Medicare Other | Admitting: Neurology

## 2015-12-16 ENCOUNTER — Encounter: Payer: Self-pay | Admitting: Neurology

## 2015-12-16 VITALS — BP 130/80 | HR 56 | Ht 65.0 in | Wt 129.0 lb

## 2015-12-16 DIAGNOSIS — G20 Parkinson's disease: Secondary | ICD-10-CM

## 2015-12-16 DIAGNOSIS — F0280 Dementia in other diseases classified elsewhere without behavioral disturbance: Secondary | ICD-10-CM

## 2015-12-16 MED ORDER — CARBIDOPA-LEVODOPA 25-100 MG PO TABS: 2.0000 | ORAL_TABLET | Freq: Three times a day (TID) | ORAL | 5 refills | Status: DC

## 2015-12-16 MED ORDER — ATROPINE SULFATE 1 % OP SOLN: OPHTHALMIC | 3 refills | Status: DC

## 2015-12-16 NOTE — Progress Notes (Signed)
[name]. was seen today in the movement disorders clinic for neurologic consultation at the request of Dr. Maxine Glenn.  His PCP is Dorian Heckle, MD.  The consultation is for the evaluation of PD. This patient is accompanied in the office by his sibling and daughter who supplements the history.  I have reviewed numerous records made available to me from Dr. Maxine Glenn through care everywhere.  The patient was diagnosed with Parkinson's disease in approximately 2008, with symptoms dating back to 2006 or 2007.  He recalls that his first symptoms were gait changes and an overall sense that he was slowing down and his voice was becoming softer.  The first medication that he was placed on was Requip and that was started by Dr. Brett Fairy.  He believes that medication was helpful when he was started on it.  He transferred care to Dr. Maxine Glenn in April, 2012.  He was started on levodopa in 2013.  Requip was d/c in august 2014 due to paranoia.  His wife was in the hospital at the time and it seemed to exacerbate the confusion.  He began to develop some memory changes in November, 2015 and was started on donepezil.    He was last seen by Dr. Maxine Glenn in October, 2016, at which point his carbidopa/levodopa 25/100 was increased to 2 tablets 3 times per day.  He takes it at 10am/2pm/6pm but he gets up at 6-7am.  He does this because when his wife was living she was a night owl and he would wait until she would wake up to take med.  He takes med 30 min before the meals.    Specific Symptoms:  Tremor: No. (rarely if he holds a glass in the hand) Family hx of similar:  No. Voice: yes (last voice therapy was in 2015) Sleep: sleeps  Vivid Dreams:  No.  Acting out dreams:  No. (used to but no longer now that on klonopin) Wet Pillows: Yes.   Postural symptoms:  Yes.    Falls?  No. Bradykinesia symptoms: slow movements and difficulty getting out of a chair (last PT in 2015) Loss of smell:  No. Loss of taste:   No. Urinary Incontinence:  Yes.  , nighttime and nocturia x 2-3 Difficulty Swallowing:  No. Handwriting, micrographia: Yes.   Trouble with ADL's:  No.  Trouble buttoning clothing: Yes.   (uses buttoner) Depression:  No. Memory changes:  Yes.  (lives alone, son does cooking and pt heats up; son does finances x 3 years; pt doesn't drive x 4 years) Hallucinations:  No. (seemed to have happen initially with requip)  visual distortions: Yes.   N/V:  No. Lightheaded:  Yes.   but rarely  Syncope: No. Diplopia:  No. Dyskinesia:  No.  Neuroimaging has previously been performed.  It is not available for my review today.  PREVIOUS MEDICATIONS: Sinemet and Requip  ALLERGIES:   Allergies  Allergen Reactions  . Penicillins     Unsure of reaction  . Vicodin [Hydrocodone-Acetaminophen] Other (See Comments)    "Mental reaction"    CURRENT MEDICATIONS:  Outpatient Encounter Prescriptions as of 12/16/2015  Medication Sig  . acetaminophen (TYLENOL) 500 MG tablet Take 1,000 mg by mouth 2 (two) times daily as needed (pain). For pain  . aspirin EC 81 MG tablet Take by mouth.  . carbidopa-levodopa (SINEMET IR) 25-100 MG per tablet Take 1.5 tablets by mouth See admin instructions. Take 1 and 1/2 tablets at 10am, 2pm and 6pm.  .  cholecalciferol (VITAMIN D) 1000 UNITS tablet Take 1,000 Units by mouth at bedtime.   . clonazePAM (KLONOPIN) 1 MG tablet Take 1.5 mg by mouth at bedtime.   . donepezil (ARICEPT) 10 MG tablet Take 5 mg by mouth at bedtime. Increase dose to 10 mg. Daily starting 03/30/2014  . LORazepam (ATIVAN) 0.5 MG tablet Take 0.5 mg by mouth daily.   . metoprolol tartrate (LOPRESSOR) 25 MG tablet TAKE 1/2 TABLET BY MOUTH 2 TIMES DAILY  . omeprazole (PRILOSEC) 20 MG capsule Take 20 mg by mouth at bedtime.    No facility-administered encounter medications on file as of 12/16/2015.     PAST MEDICAL HISTORY:   Past Medical History:  Diagnosis Date  . Anxiety   . Aortic stenosis   .  Arthritis    left hip  . Cancer ([place])    basal cell- facial?  . Depression   . GERD (gastroesophageal reflux disease)   . Headache(784.0)   . Heart murmur   . Hernia, inguinal, bilateral, recurrent   . Kidney stones   . Parkinson disease ([place])   . Parkinson's disease (tremor, stiffness, slow motion, unstable posture) ([place]) 12/11/2006  . PONV (postoperative nausea and vomiting)   . Restless leg syndrome   . S/P TAVR (transcatheter aortic valve replacement) 11/18/2013   26 mm Edwards Sapien XT transcatheter heart valve placed via open right transfemoral approach  . TIA (transient ischemic attack) 02/08/2007    PAST SURGICAL HISTORY:   Past Surgical History:  Procedure Laterality Date  . CARDIAC CATHETERIZATION  10/09/13  . cataract  2008   bilateral  . COLONOSCOPY    . CYSTOSCOPY/RETROGRADE/URETEROSCOPY  05/29/2011   Procedure: CYSTOSCOPY/RETROGRADE/URETEROSCOPY;  Surgeon: Malka So, MD;  [facility]: [facility];  Service: Urology;  Laterality: Left;  no stent   . EYE SURGERY Bilateral    /w IOL  . INGUINAL HERNIA REPAIR Bilateral 03/30/2014   Procedure: OPEN BILATERAL INGUINAL HERNIA REPAIR WITH MESH;  Surgeon: Coralie Keens, MD;  Location: [facility];  Service: General;  Laterality: Bilateral;  . INSERTION OF MESH Bilateral 03/30/2014   Procedure: INSERTION OF MESH;  Surgeon: Coralie Keens, MD;  Location: [facility];  Service: General;  Laterality: Bilateral;  . INTRAOPERATIVE TRANSESOPHAGEAL ECHOCARDIOGRAM N/A 11/18/2013   Procedure: INTRAOPERATIVE TRANSESOPHAGEAL ECHOCARDIOGRAM;  Surgeon: Sherren Mocha, MD;  Location: [facility] OR;  Service: Open Heart Surgery;  Laterality: N/A;  . LEFT AND RIGHT HEART CATHETERIZATION WITH CORONARY ANGIOGRAM N/A 10/09/2013   Procedure: LEFT AND RIGHT HEART CATHETERIZATION WITH CORONARY ANGIOGRAM;  Surgeon: Blane Ohara, MD;  Location: [facility] CATH LAB;  Service: Cardiovascular;  Laterality: N/A;  . TRANSCATHETER AORTIC VALVE REPLACEMENT, TRANSFEMORAL N/A 11/18/2013    Procedure: TRANSCATHETER AORTIC VALVE REPLACEMENT, TRANSFEMORAL;  Surgeon: Sherren Mocha, MD;  Location: [facility];  Service: Open Heart Surgery;  Laterality: N/A;    SOCIAL HISTORY:   Social History   Social History  . Marital status: Widowed    Spouse name: N/A  . Number of children: N/A  . Years of education: N/A   Occupational History  . Not on file.   Social History Main Topics  . Smoking status: Never Smoker  . Smokeless tobacco: Never Used  . Alcohol use No  . Drug use: No  . Sexual activity: Not on file   Other Topics Concern  . Not on file   Social History Narrative  . No narrative on file    FAMILY HISTORY:   Family Status  Relation Status  . Mother  Deceased at age 24  . Father Deceased at age 54    ROS:  A complete 10 system review of systems was obtained and was unremarkable apart from what is mentioned above.  PHYSICAL EXAMINATION:    VITALS:   Vitals:   12/16/15 1356  BP: 130/80  Pulse: (!) 56  Weight: 129 lb (58.5 kg)  Height: 5\' 5"  (1.651 m)    GEN:  The patient appears stated age and is in NAD. HEENT:  Normocephalic, atraumatic.  The mucous membranes are moist. The superficial temporal arteries are without ropiness or tenderness. CV:  RRR Lungs:  CTAB Neck/HEME:  There are no carotid bruits bilaterally.  Neurological examination:  Orientation:  Montreal Cognitive Assessment  12/16/2015  Visuospatial/ Executive (0/5) 1  Naming (0/3) 3  Attention: Read list of digits (0/2) 2  Attention: Read list of letters (0/1) 1  Attention: Serial 7 subtraction starting at 100 (0/3) 2  Language: Repeat phrase (0/2) 2  Language : Fluency (0/1) 0  Abstraction (0/2) 2  Delayed Recall (0/5) 3  Orientation (0/6) 5  Total 21  Adjusted Score (based on education) 21    Cranial nerves: There is good facial symmetry. Pupils are equal round and reactive to light bilaterally. Fundoscopic exam reveals clear margins bilaterally. Extraocular muscles are intact.   There are square wave jerks.   The visual fields are full to confrontational testing. The speech is fluent and clear. Soft palate rises symmetrically and there is no tongue deviation. Hearing is intact to conversational tone. Sensation: Sensation is intact to light and pinprick throughout (facial, trunk, extremities). Vibration is intact at the bilateral big toe. There is no extinction with double simultaneous stimulation. There is no sensory dermatomal level identified. Motor: Strength is 5/5 in the bilateral upper and lower extremities.   Shoulder shrug is equal and symmetric.  There is no pronator drift. Deep tendon reflexes: Deep tendon reflexes are 3+/4 at the bilateral biceps, triceps, brachioradialis, patella and achilles. Plantar responses are downgoing bilaterally.  Movement examination: Tone: There is mild increased tone in the LUE (he was past due for med and took it while I was in room) Abnormal movements: A tremor can be felt more than seen in the UE, L more than right Coordination:  There is decremation with RAM's, with any form of RAMS, including alternating supination and pronation of the forearm, hand opening and closing, finger taps, heel taps and toe taps, L much more than right Gait and Station: The patient has no difficulty arising out of a deep-seated chair without the use of the hands. The patient's stride length is decreased with stooped posture and decreased arm swing on the L.    ASSESSMENT/PLAN:  1.  Idiopathic Parkinson's disease.  The patient has tremor, bradykinesia, rigidity and postural instability.  Dx was in 2008 by Dr. Love/Dohmeier but followed by Dr. Maxine Glenn for past several years.  -We discussed the diagnosis as well as pathophysiology of the disease.  We discussed treatment options as well as prognostic indicators.  Patient education was provided.  -Greater than 50% of the 70 minute visit was spent in counseling answering questions and talking about what to expect  now as well as in the future.  We talked about medication options as well as potential future surgical options.  We talked about safety in the home.  -continue carbidopa/levodopa 25/100, 2 po tid but move timing to 8am/12pm/4pm.  Let me know if have any issues with cramping in the evening or  trouble getting up in the middle of the night.  If so, we will add extended release levodopa.  -refer for home PT/ST.  He is homebound.  -We discussed community resources in the area including patient support groups and community exercise programs for PD and pt education was provided to the patient.    -[facility] forms filled out  2.  Vasomotor rhinorrhea  -discussed atropine drops and the r/b/se and would like to try.  Do not overuse more than tid.    3.  Parkinsons dementia  -safety in home discussed  -on donepezil, 10 mg daily.  Will continue  4.  Follow up is anticipated in the next few months, sooner should new neurologic issues arise.

## 2015-12-16 NOTE — Patient Instructions (Addendum)
1. Atropine directions: Place 1-2 drops on a q-tip and place in nose no more than every 6 hours.  2. Move Levodopa to 2 tablets at 8 am, 12 pm and 4 pm

## 2015-12-17 NOTE — Addendum Note (Signed)
Addended byAnnamaria Helling on: 12/17/2015 08:22 AM   Modules accepted: Orders

## 2015-12-23 DIAGNOSIS — I350 Nonrheumatic aortic (valve) stenosis: Secondary | ICD-10-CM | POA: Diagnosis not present

## 2015-12-23 DIAGNOSIS — F329 Major depressive disorder, single episode, unspecified: Secondary | ICD-10-CM | POA: Diagnosis not present

## 2015-12-23 DIAGNOSIS — G2581 Restless legs syndrome: Secondary | ICD-10-CM | POA: Diagnosis not present

## 2015-12-23 DIAGNOSIS — Z8673 Personal history of transient ischemic attack (TIA), and cerebral infarction without residual deficits: Secondary | ICD-10-CM | POA: Diagnosis not present

## 2015-12-23 DIAGNOSIS — K219 Gastro-esophageal reflux disease without esophagitis: Secondary | ICD-10-CM | POA: Diagnosis not present

## 2015-12-23 DIAGNOSIS — Z952 Presence of prosthetic heart valve: Secondary | ICD-10-CM | POA: Diagnosis not present

## 2015-12-23 DIAGNOSIS — M1612 Unilateral primary osteoarthritis, left hip: Secondary | ICD-10-CM | POA: Diagnosis not present

## 2015-12-23 DIAGNOSIS — Z7982 Long term (current) use of aspirin: Secondary | ICD-10-CM | POA: Diagnosis not present

## 2015-12-23 DIAGNOSIS — F419 Anxiety disorder, unspecified: Secondary | ICD-10-CM | POA: Diagnosis not present

## 2015-12-23 DIAGNOSIS — G20 Parkinson's disease: Secondary | ICD-10-CM | POA: Diagnosis not present

## 2015-12-23 DIAGNOSIS — R471 Dysarthria and anarthria: Secondary | ICD-10-CM | POA: Diagnosis not present

## 2015-12-24 ENCOUNTER — Telehealth: Payer: Self-pay | Admitting: Neurology

## 2015-12-24 DIAGNOSIS — F419 Anxiety disorder, unspecified: Secondary | ICD-10-CM | POA: Diagnosis not present

## 2015-12-24 DIAGNOSIS — F329 Major depressive disorder, single episode, unspecified: Secondary | ICD-10-CM | POA: Diagnosis not present

## 2015-12-24 DIAGNOSIS — R471 Dysarthria and anarthria: Secondary | ICD-10-CM | POA: Diagnosis not present

## 2015-12-24 DIAGNOSIS — G2581 Restless legs syndrome: Secondary | ICD-10-CM | POA: Diagnosis not present

## 2015-12-24 DIAGNOSIS — M1612 Unilateral primary osteoarthritis, left hip: Secondary | ICD-10-CM | POA: Diagnosis not present

## 2015-12-24 DIAGNOSIS — G20 Parkinson's disease: Secondary | ICD-10-CM | POA: Diagnosis not present

## 2015-12-24 NOTE — Telephone Encounter (Signed)
ST from [facility] called to get verbal order for 2 x weekly for 3 weeks and 1 x weekly for one more week. Order given.

## 2015-12-27 ENCOUNTER — Telehealth: Payer: Self-pay | Admitting: Neurology

## 2015-12-27 DIAGNOSIS — F419 Anxiety disorder, unspecified: Secondary | ICD-10-CM | POA: Diagnosis not present

## 2015-12-27 DIAGNOSIS — F329 Major depressive disorder, single episode, unspecified: Secondary | ICD-10-CM | POA: Diagnosis not present

## 2015-12-27 DIAGNOSIS — G2581 Restless legs syndrome: Secondary | ICD-10-CM | POA: Diagnosis not present

## 2015-12-27 DIAGNOSIS — R471 Dysarthria and anarthria: Secondary | ICD-10-CM | POA: Diagnosis not present

## 2015-12-27 DIAGNOSIS — G20 Parkinson's disease: Secondary | ICD-10-CM | POA: Diagnosis not present

## 2015-12-27 DIAGNOSIS — M1612 Unilateral primary osteoarthritis, left hip: Secondary | ICD-10-CM | POA: Diagnosis not present

## 2015-12-27 NOTE — Telephone Encounter (Signed)
Spoke with Amy physical therapist at [facility]. Gave verbal orders for her to see patient 1 x week for 3 weeks, 2 x week for 2 weeks, then 1 x week for 1 week.

## 2015-12-28 DIAGNOSIS — F419 Anxiety disorder, unspecified: Secondary | ICD-10-CM | POA: Diagnosis not present

## 2015-12-28 DIAGNOSIS — F329 Major depressive disorder, single episode, unspecified: Secondary | ICD-10-CM | POA: Diagnosis not present

## 2015-12-28 DIAGNOSIS — G2581 Restless legs syndrome: Secondary | ICD-10-CM | POA: Diagnosis not present

## 2015-12-28 DIAGNOSIS — R471 Dysarthria and anarthria: Secondary | ICD-10-CM | POA: Diagnosis not present

## 2015-12-28 DIAGNOSIS — M1612 Unilateral primary osteoarthritis, left hip: Secondary | ICD-10-CM | POA: Diagnosis not present

## 2015-12-28 DIAGNOSIS — G20 Parkinson's disease: Secondary | ICD-10-CM | POA: Diagnosis not present

## 2015-12-31 ENCOUNTER — Telehealth: Payer: Self-pay | Admitting: Neurology

## 2015-12-31 NOTE — Telephone Encounter (Signed)
-----   Message from Elenora Fender sent at 12/31/2015  2:15 PM EDT ----- Gwyneth Revels 04/10/2041. Carrolyn Meiers (PT Home Health) she needs verbal orders for him. Her # is [phone]. Thank you

## 2015-12-31 NOTE — Telephone Encounter (Signed)
Called Lisa back and [facility] verbal order to continue PT.

## 2016-01-04 DIAGNOSIS — R471 Dysarthria and anarthria: Secondary | ICD-10-CM | POA: Diagnosis not present

## 2016-01-04 DIAGNOSIS — M1612 Unilateral primary osteoarthritis, left hip: Secondary | ICD-10-CM | POA: Diagnosis not present

## 2016-01-04 DIAGNOSIS — G20 Parkinson's disease: Secondary | ICD-10-CM | POA: Diagnosis not present

## 2016-01-04 DIAGNOSIS — F419 Anxiety disorder, unspecified: Secondary | ICD-10-CM | POA: Diagnosis not present

## 2016-01-04 DIAGNOSIS — G2581 Restless legs syndrome: Secondary | ICD-10-CM | POA: Diagnosis not present

## 2016-01-04 DIAGNOSIS — F329 Major depressive disorder, single episode, unspecified: Secondary | ICD-10-CM | POA: Diagnosis not present

## 2016-01-06 DIAGNOSIS — F329 Major depressive disorder, single episode, unspecified: Secondary | ICD-10-CM | POA: Diagnosis not present

## 2016-01-06 DIAGNOSIS — F419 Anxiety disorder, unspecified: Secondary | ICD-10-CM | POA: Diagnosis not present

## 2016-01-06 DIAGNOSIS — G2581 Restless legs syndrome: Secondary | ICD-10-CM | POA: Diagnosis not present

## 2016-01-06 DIAGNOSIS — G20 Parkinson's disease: Secondary | ICD-10-CM | POA: Diagnosis not present

## 2016-01-06 DIAGNOSIS — R471 Dysarthria and anarthria: Secondary | ICD-10-CM | POA: Diagnosis not present

## 2016-01-06 DIAGNOSIS — M1612 Unilateral primary osteoarthritis, left hip: Secondary | ICD-10-CM | POA: Diagnosis not present

## 2016-01-07 ENCOUNTER — Telehealth: Payer: Self-pay | Admitting: Cardiology

## 2016-01-07 DIAGNOSIS — F419 Anxiety disorder, unspecified: Secondary | ICD-10-CM | POA: Diagnosis not present

## 2016-01-07 DIAGNOSIS — G20 Parkinson's disease: Secondary | ICD-10-CM | POA: Diagnosis not present

## 2016-01-07 DIAGNOSIS — R471 Dysarthria and anarthria: Secondary | ICD-10-CM | POA: Diagnosis not present

## 2016-01-07 DIAGNOSIS — G2581 Restless legs syndrome: Secondary | ICD-10-CM | POA: Diagnosis not present

## 2016-01-07 DIAGNOSIS — M1612 Unilateral primary osteoarthritis, left hip: Secondary | ICD-10-CM | POA: Diagnosis not present

## 2016-01-07 DIAGNOSIS — F329 Major depressive disorder, single episode, unspecified: Secondary | ICD-10-CM | POA: Diagnosis not present

## 2016-01-07 NOTE — Telephone Encounter (Signed)
Please stop metoprolol. Thanks Candee Furbish, MD

## 2016-01-07 NOTE — Telephone Encounter (Signed)
PT'S  DAUGHTER  AWARE TO STOP  METOPROLOL./CY

## 2016-01-07 NOTE — Telephone Encounter (Signed)
New Message:    Blood pressure have been running low lately. She wants to know if he needs to stop taking the Lopressor or decrease it?

## 2016-01-07 NOTE — Telephone Encounter (Signed)
SPOKE  WITH PT'S  DAUGHTER.    PT  HAS   RECENTLY   STARTED  PT   FOR  PARKINSON'S  AND OVER THE  LAST  COUPLE  OF  DAYS  B/P  HAS  BEEN CHECKED  AND  RUNNING LOW   NOT  SURE  HOW  LONG  HAS  BEEN HAPPENING . PT IS  A SYMPTOMATIC  READINGS FROM  YESTERDAY AND  TODAY   HAVE BEEN RANGING  80-100/47-70 WITH  HEART  RATE   RUNNING IN MID  50'S  TAKING  LOPRESSOR  25 MG  1/2  TAB  BID   AND IS ON  SINEMET  FOR  PARKISON'S  WILL SEND  TO  DR Marlou Porch  FOR REVIEW .Adonis Housekeeper

## 2016-01-10 ENCOUNTER — Telehealth: Payer: Self-pay | Admitting: Neurology

## 2016-01-10 NOTE — Telephone Encounter (Signed)
Late entry.  Call via answering service on 01/06/16 at 5:25pm.  Stated that OT took BP and lowest reading was 96/57 and retook and was 100/63.  Pt asymptommatic.  Wanted to know whether to take toprol.  I expressed that I don't prescribe his metoprol and since I didn't have chart open, wasn't sure what it was being prescribed for but thought that it may not be for BP control but for something else and answering service nurse expressed it was for "his heart but not BP."  Said that likely could take night dose since only on 12.5 mg but prefer that they ask prescribing physician since I don't give him this.  Also expressed that PD causes lowering of BP and this is likely what they are seeing.  Since that time, Dr. Marlou Porch has d/c metoprolol.

## 2016-01-11 ENCOUNTER — Encounter (INDEPENDENT_AMBULATORY_CARE_PROVIDER_SITE_OTHER): Payer: Self-pay

## 2016-01-11 ENCOUNTER — Encounter: Payer: Self-pay | Admitting: Cardiology

## 2016-01-11 ENCOUNTER — Ambulatory Visit (INDEPENDENT_AMBULATORY_CARE_PROVIDER_SITE_OTHER): Payer: Medicare Other | Admitting: Cardiology

## 2016-01-11 VITALS — BP 108/86 | HR 56 | Ht 65.0 in | Wt 128.8 lb

## 2016-01-11 DIAGNOSIS — I359 Nonrheumatic aortic valve disorder, unspecified: Secondary | ICD-10-CM | POA: Diagnosis not present

## 2016-01-11 DIAGNOSIS — F419 Anxiety disorder, unspecified: Secondary | ICD-10-CM | POA: Diagnosis not present

## 2016-01-11 DIAGNOSIS — Z952 Presence of prosthetic heart valve: Secondary | ICD-10-CM

## 2016-01-11 DIAGNOSIS — Z954 Presence of other heart-valve replacement: Secondary | ICD-10-CM

## 2016-01-11 DIAGNOSIS — G20 Parkinson's disease: Secondary | ICD-10-CM

## 2016-01-11 DIAGNOSIS — G2581 Restless legs syndrome: Secondary | ICD-10-CM | POA: Diagnosis not present

## 2016-01-11 DIAGNOSIS — M1612 Unilateral primary osteoarthritis, left hip: Secondary | ICD-10-CM | POA: Diagnosis not present

## 2016-01-11 DIAGNOSIS — R471 Dysarthria and anarthria: Secondary | ICD-10-CM | POA: Diagnosis not present

## 2016-01-11 DIAGNOSIS — F329 Major depressive disorder, single episode, unspecified: Secondary | ICD-10-CM | POA: Diagnosis not present

## 2016-01-11 NOTE — Patient Instructions (Signed)

## 2016-01-11 NOTE — Progress Notes (Signed)
[facility]. [address] [place], [place]  [address] Phone: [phone] Fax:  [phone]  Date:  01/11/2016   ID:  [name]., DOB 07/25/1940, MRN [id number]  PCP:  Leeroy Cha, MD   History of Present Illness: [name]. is a 75 y.o. male with Parkinson's disease status post transcatheter aortic valve replacement #26 mm Edwards's AP and valve on 11/18/13 secondary to severe aortic stenosis.  He is doing very well. Has mild to moderate perivalvular leak. Overall normal ejection fraction. Feeling well. Overall he is doing well without any syncope, no chest pain, no shortness of breath.   Catheterization showed essentially normal coronary arteries.  His Parkinson's has progressed. More difficult to vocalize.  His blood pressure has been low recently. At home sometimes in the 0000000 systolic. He has not had any syncope. Daughter called. Stopped metoprolol. Agree. Needs to try to consume more liquids, salt. Occasionally may have some chest heaviness at times, may be GERD associated. Heart valve sounds excellent.  Wt Readings from Last 3 Encounters:  01/11/16 128 lb 12.8 oz (58.4 kg)  12/16/15 129 lb (58.5 kg)  07/01/15 135 lb (61.2 kg)     Past Medical History:  Diagnosis Date  . Anxiety   . Aortic stenosis   . Arthritis    left hip  . Cancer ([place])    basal cell- facial?  . Depression   . GERD (gastroesophageal reflux disease)   . Headache(784.0)   . Heart murmur   . Hernia, inguinal, bilateral, recurrent   . Kidney stones   . Parkinson disease ([place])   . Parkinson's disease (tremor, stiffness, slow motion, unstable posture) ([place]) 12/11/2006  . PONV (postoperative nausea and vomiting)   . Restless leg syndrome   . S/P TAVR (transcatheter aortic valve replacement) 11/18/2013   26 mm Edwards Sapien XT transcatheter heart valve placed via open right transfemoral approach  . TIA (transient ischemic attack) 02/08/2007    Past Surgical History:   Procedure Laterality Date  . CARDIAC CATHETERIZATION  10/09/13  . cataract  2008   bilateral  . COLONOSCOPY    . CYSTOSCOPY/RETROGRADE/URETEROSCOPY  05/29/2011   Procedure: CYSTOSCOPY/RETROGRADE/URETEROSCOPY;  Surgeon: Malka So, MD;  Location: [facility];  Service: Urology;  Laterality: Left;  no stent   . EYE SURGERY Bilateral    /w IOL  . INGUINAL HERNIA REPAIR Bilateral 03/30/2014   Procedure: OPEN BILATERAL INGUINAL HERNIA REPAIR WITH MESH;  Surgeon: Coralie Keens, MD;  Location: [facility];  Service: General;  Laterality: Bilateral;  . INSERTION OF MESH Bilateral 03/30/2014   Procedure: INSERTION OF MESH;  Surgeon: Coralie Keens, MD;  Location: [facility];  Service: General;  Laterality: Bilateral;  . INTRAOPERATIVE TRANSESOPHAGEAL ECHOCARDIOGRAM N/A 11/18/2013   Procedure: INTRAOPERATIVE TRANSESOPHAGEAL ECHOCARDIOGRAM;  Surgeon: Sherren Mocha, MD;  Location[facility];  Service: Open Heart Surgery;  Laterality: N/A;  . LEFT AND RIGHT HEART CATHETERIZATION WITH CORONARY ANGIOGRAM N/A 10/09/2013   Procedure: LEFT AND RIGHT HEART CATHETERIZATION WITH CORONARY ANGIOGRAM;  Surgeon: Blane Ohara, MD;  Location: [facility] CATH LAB;  Service: Cardiovascular;  Laterality: N/A;  . TRANSCATHETER AORTIC VALVE REPLACEMENT, TRANSFEMORAL N/A 11/18/2013   Procedure: TRANSCATHETER AORTIC VALVE REPLACEMENT, TRANSFEMORAL;  Surgeon: Sherren Mocha, MD;  Location: [facility];  Service: Open Heart Surgery;  Laterality: N/A;    Current Outpatient Prescriptions  Medication Sig Dispense Refill  . acetaminophen (TYLENOL) 500 MG tablet Take 1,000 mg by mouth 2 (two) times daily  as needed (pain). For pain    . aspirin EC 81 MG tablet Take by mouth.    Marland Kitchen atropine 1 % ophthalmic solution Use as directed 2 mL 3  . carbidopa-levodopa (SINEMET IR) 25-100 MG tablet Take 2 tablets by mouth 3 (three) times daily. 180 tablet 5  . cholecalciferol (VITAMIN D) 1000 UNITS tablet Take 1,000 Units by mouth at bedtime.     . clonazePAM  (KLONOPIN) 1 MG tablet Take 1.5 mg by mouth at bedtime.     . donepezil (ARICEPT) 10 MG tablet Take 10 mg by mouth at bedtime.    Marland Kitchen LORazepam (ATIVAN) 0.5 MG tablet Take 0.5 mg by mouth as directed.     Marland Kitchen omeprazole (PRILOSEC) 20 MG capsule Take 20 mg by mouth at bedtime.     . sertraline (ZOLOFT) 50 MG tablet Take by mouth.     No current facility-administered medications for this visit.     Allergies:    Allergies  Allergen Reactions  . Penicillins     Unsure of reaction  . Vicodin [Hydrocodone-Acetaminophen] Other (See Comments)    "Mental reaction"    Social History:  The patient  reports that he has never smoked. He has never used smokeless tobacco. He reports that he does not drink alcohol or use drugs.   Family History  Problem Relation Age of Onset  . Arthritis/Rheumatoid Mother   . Prostate cancer Father   . Anemia Daughter   . Asthma Daughter   . Diabetes Daughter   . Hypertension Daughter   . Kidney disease Daughter   . Cancer Son   . Breast cancer Sister   . Heart attack Neg Hx   . Stroke Neg Hx     ROS:  Please see the history of present illness.   Denies any fevers, chills, orthopnea, PND   question of fatigue, chest pressure. All other systems reviewed and negative.   PHYSICAL EXAM: VS:  BP 108/86   Pulse (!) 56   Ht 5\' 5"  (1.651 m)   Wt 128 lb 12.8 oz (58.4 kg)   BMI 21.43 kg/m  Well nourished, well developed, in no acute distress HEENT: normal, [place]/AT, EOMI Neck: no JVD, normal carotid upstroke, no bruit Cardiac:  normal S1, S2; RRR; soft systolic murmur Lungs:  clear to auscultation bilaterally, no wheezing, rhonchi or rales Abd: soft, nontender, no hepatomegaly, no bruits Ext: no edema, 2+ distal pulses Skin: warm and dry GU: deferred Neuro: Parkinsonian signs, AAO x 3  EKG: EKG today 01/11/16-sinus bradycardia rate 56 with no other abnormalities.     ASSESSMENT AND PLAN:  1. History of aortic valve replacement via TAVR - doing very well,  mild to moderate perivalvular leak. Echocardiogram reviewed. Explained to him. Asymptomatic. He should continue with his aspirin 81 mg. We will continue with low-dose metoprolol. He is doing very well. Reviewed cardiac catheterization which showed normal/no flow-limiting coronary artery disease 2. Parkinson's disease-neurology.  3. Hypotension-daughter called, stopped metoprolol dose. I agree. Salt liberalization, fluids. His autonomic dysfunction with Parkinson's will make him prone for hypotension. Be careful when getting up. Mild bradycardia noted on ECG should not be an issue. Encouraged protein.  4. Reminded him of dental prophylaxis. 5. We will send neurology at [facility] notes. Dr. Charise Killian. 6. We will see him back in 12 months.  Signed, Candee Furbish, MD [facility]  01/11/2016 2:16 PM

## 2016-01-12 DIAGNOSIS — F419 Anxiety disorder, unspecified: Secondary | ICD-10-CM | POA: Diagnosis not present

## 2016-01-12 DIAGNOSIS — F329 Major depressive disorder, single episode, unspecified: Secondary | ICD-10-CM | POA: Diagnosis not present

## 2016-01-12 DIAGNOSIS — G2581 Restless legs syndrome: Secondary | ICD-10-CM | POA: Diagnosis not present

## 2016-01-12 DIAGNOSIS — G20 Parkinson's disease: Secondary | ICD-10-CM | POA: Diagnosis not present

## 2016-01-12 DIAGNOSIS — M1612 Unilateral primary osteoarthritis, left hip: Secondary | ICD-10-CM | POA: Diagnosis not present

## 2016-01-12 DIAGNOSIS — R471 Dysarthria and anarthria: Secondary | ICD-10-CM | POA: Diagnosis not present

## 2016-01-13 DIAGNOSIS — F419 Anxiety disorder, unspecified: Secondary | ICD-10-CM | POA: Diagnosis not present

## 2016-01-13 DIAGNOSIS — G2581 Restless legs syndrome: Secondary | ICD-10-CM | POA: Diagnosis not present

## 2016-01-13 DIAGNOSIS — R471 Dysarthria and anarthria: Secondary | ICD-10-CM | POA: Diagnosis not present

## 2016-01-13 DIAGNOSIS — M1612 Unilateral primary osteoarthritis, left hip: Secondary | ICD-10-CM | POA: Diagnosis not present

## 2016-01-13 DIAGNOSIS — F329 Major depressive disorder, single episode, unspecified: Secondary | ICD-10-CM | POA: Diagnosis not present

## 2016-01-13 DIAGNOSIS — G20 Parkinson's disease: Secondary | ICD-10-CM | POA: Diagnosis not present

## 2016-01-14 DIAGNOSIS — F418 Other specified anxiety disorders: Secondary | ICD-10-CM | POA: Diagnosis not present

## 2016-01-14 DIAGNOSIS — F329 Major depressive disorder, single episode, unspecified: Secondary | ICD-10-CM | POA: Diagnosis not present

## 2016-01-14 DIAGNOSIS — R42 Dizziness and giddiness: Secondary | ICD-10-CM | POA: Diagnosis not present

## 2016-01-14 DIAGNOSIS — G20 Parkinson's disease: Secondary | ICD-10-CM | POA: Diagnosis not present

## 2016-01-14 DIAGNOSIS — Z23 Encounter for immunization: Secondary | ICD-10-CM | POA: Diagnosis not present

## 2016-01-14 DIAGNOSIS — F419 Anxiety disorder, unspecified: Secondary | ICD-10-CM | POA: Diagnosis not present

## 2016-01-14 DIAGNOSIS — M1612 Unilateral primary osteoarthritis, left hip: Secondary | ICD-10-CM | POA: Diagnosis not present

## 2016-01-14 DIAGNOSIS — D696 Thrombocytopenia, unspecified: Secondary | ICD-10-CM | POA: Diagnosis not present

## 2016-01-14 DIAGNOSIS — G2581 Restless legs syndrome: Secondary | ICD-10-CM | POA: Diagnosis not present

## 2016-01-14 DIAGNOSIS — Z952 Presence of prosthetic heart valve: Secondary | ICD-10-CM | POA: Diagnosis not present

## 2016-01-14 DIAGNOSIS — R471 Dysarthria and anarthria: Secondary | ICD-10-CM | POA: Diagnosis not present

## 2016-01-18 DIAGNOSIS — R471 Dysarthria and anarthria: Secondary | ICD-10-CM | POA: Diagnosis not present

## 2016-01-18 DIAGNOSIS — M1612 Unilateral primary osteoarthritis, left hip: Secondary | ICD-10-CM | POA: Diagnosis not present

## 2016-01-18 DIAGNOSIS — F419 Anxiety disorder, unspecified: Secondary | ICD-10-CM | POA: Diagnosis not present

## 2016-01-18 DIAGNOSIS — F329 Major depressive disorder, single episode, unspecified: Secondary | ICD-10-CM | POA: Diagnosis not present

## 2016-01-18 DIAGNOSIS — G2581 Restless legs syndrome: Secondary | ICD-10-CM | POA: Diagnosis not present

## 2016-01-18 DIAGNOSIS — G20 Parkinson's disease: Secondary | ICD-10-CM | POA: Diagnosis not present

## 2016-01-20 DIAGNOSIS — G2581 Restless legs syndrome: Secondary | ICD-10-CM | POA: Diagnosis not present

## 2016-01-20 DIAGNOSIS — F419 Anxiety disorder, unspecified: Secondary | ICD-10-CM | POA: Diagnosis not present

## 2016-01-20 DIAGNOSIS — G20 Parkinson's disease: Secondary | ICD-10-CM | POA: Diagnosis not present

## 2016-01-20 DIAGNOSIS — M1612 Unilateral primary osteoarthritis, left hip: Secondary | ICD-10-CM | POA: Diagnosis not present

## 2016-01-20 DIAGNOSIS — R471 Dysarthria and anarthria: Secondary | ICD-10-CM | POA: Diagnosis not present

## 2016-01-20 DIAGNOSIS — F329 Major depressive disorder, single episode, unspecified: Secondary | ICD-10-CM | POA: Diagnosis not present

## 2016-01-22 DIAGNOSIS — G2581 Restless legs syndrome: Secondary | ICD-10-CM | POA: Diagnosis not present

## 2016-01-22 DIAGNOSIS — F419 Anxiety disorder, unspecified: Secondary | ICD-10-CM | POA: Diagnosis not present

## 2016-01-22 DIAGNOSIS — F329 Major depressive disorder, single episode, unspecified: Secondary | ICD-10-CM | POA: Diagnosis not present

## 2016-01-22 DIAGNOSIS — M1612 Unilateral primary osteoarthritis, left hip: Secondary | ICD-10-CM | POA: Diagnosis not present

## 2016-01-22 DIAGNOSIS — R471 Dysarthria and anarthria: Secondary | ICD-10-CM | POA: Diagnosis not present

## 2016-01-22 DIAGNOSIS — G20 Parkinson's disease: Secondary | ICD-10-CM | POA: Diagnosis not present

## 2016-01-25 DIAGNOSIS — M1612 Unilateral primary osteoarthritis, left hip: Secondary | ICD-10-CM | POA: Diagnosis not present

## 2016-01-25 DIAGNOSIS — F419 Anxiety disorder, unspecified: Secondary | ICD-10-CM | POA: Diagnosis not present

## 2016-01-25 DIAGNOSIS — G20 Parkinson's disease: Secondary | ICD-10-CM | POA: Diagnosis not present

## 2016-01-25 DIAGNOSIS — F329 Major depressive disorder, single episode, unspecified: Secondary | ICD-10-CM | POA: Diagnosis not present

## 2016-01-25 DIAGNOSIS — R471 Dysarthria and anarthria: Secondary | ICD-10-CM | POA: Diagnosis not present

## 2016-01-25 DIAGNOSIS — G2581 Restless legs syndrome: Secondary | ICD-10-CM | POA: Diagnosis not present

## 2016-01-31 ENCOUNTER — Telehealth: Payer: Self-pay | Admitting: Neurology

## 2016-01-31 MED ORDER — DONEPEZIL HCL 10 MG PO TABS: 10.0000 mg | ORAL_TABLET | Freq: Every day | ORAL | 1 refills | Status: DC

## 2016-01-31 NOTE — Telephone Encounter (Signed)
[name] 02-12-2041. His daughter [name] called regarding his Parkinson's medication Donepezil. He was seeing a Garment/textile technologist at [facility] who prescribed it. He was seen by Dr. Carles Collet in August. He has ran out of this medication and the Neurologist that prescribed it is no longer there. She was wondering could Dr. Carles Collet refill the Donepezil. [facility] Her # is [phone]. Thank you

## 2016-01-31 NOTE — Telephone Encounter (Signed)
RX sent to the pharmacy. Patient's daughter aware.

## 2016-03-18 NOTE — Progress Notes (Signed)
[name]. was seen today in the movement disorders clinic for neurologic consultation at the request of Dr. Maxine Glenn.  His PCP is Leeroy Cha, MD.  The consultation is for the evaluation of PD. This patient is accompanied in the office by his sibling and daughter who supplements the history.  I have reviewed numerous records made available to me from Dr. Maxine Glenn through care everywhere.  The patient was diagnosed with Parkinson's disease in approximately 2008, with symptoms dating back to 2006 or 2007.  He recalls that his first symptoms were gait changes and an overall sense that he was slowing down and his voice was becoming softer.  The first medication that he was placed on was Requip and that was started by Dr. Brett Fairy.  He believes that medication was helpful when he was started on it.  He transferred care to Dr. Maxine Glenn in April, 2012.  He was started on levodopa in 2013.  Requip was d/c in august 2014 due to paranoia.  His wife was in the hospital at the time and it seemed to exacerbate the confusion.  He began to develop some memory changes in November, 2015 and was started on donepezil.    He was last seen by Dr. Maxine Glenn in October, 2016, at which point his carbidopa/levodopa 25/100 was increased to 2 tablets 3 times per day.  He takes it at 10am/2pm/6pm but he gets up at 6-7am.  He does this because when his wife was living she was a night owl and he would wait until she would wake up to take med.  He takes med 30 min before the meals.    03/21/16 update: Pt f/u today, accompanied by his daughter who supplements the hx.  On carbidopa/levodopa 25/100, 2 po tid but asked him to move timing of medication to 8am/12am/4pm.  Wrote RX for home PT.  States that it seemed to help some.  Family states that his BP was being monitored and it was low and so he was sent to the cardiologist and his BP medication was d/c.  No falls since our last visit.  No hallucinations.  No lightheadedness or  near syncope.  Gave him atropine gtts last visit as well for nose running and states that he isn't sure it helped.  He wasn't that consistent with it either.  Only exercise he gets is walking in the house.  Still on aricept.  Doing well with swallowing.    PREVIOUS MEDICATIONS: Sinemet and Requip  ALLERGIES:   Allergies  Allergen Reactions  . Penicillins     Unsure of reaction  . Vicodin [Hydrocodone-Acetaminophen] Other (See Comments)    "Mental reaction"    CURRENT MEDICATIONS:  Outpatient Encounter Prescriptions as of 03/21/2016  Medication Sig  . acetaminophen (TYLENOL) 500 MG tablet Take 1,000 mg by mouth 2 (two) times daily as needed (pain). For pain  . aspirin EC 81 MG tablet Take by mouth.  Marland Kitchen atropine 1 % ophthalmic solution Use as directed  . carbidopa-levodopa (SINEMET IR) 25-100 MG tablet Take 2 tablets by mouth 3 (three) times daily.  . cholecalciferol (VITAMIN D) 1000 UNITS tablet Take 1,000 Units by mouth at bedtime.   . clonazePAM (KLONOPIN) 1 MG tablet Take 1 mg by mouth at bedtime.   . donepezil (ARICEPT) 10 MG tablet Take 1 tablet (10 mg total) by mouth at bedtime.  Marland Kitchen LORazepam (ATIVAN) 0.5 MG tablet Take 0.5 mg by mouth as directed.   Marland Kitchen omeprazole (PRILOSEC) 20  MG capsule Take 20 mg by mouth at bedtime.   . sertraline (ZOLOFT) 50 MG tablet Take by mouth.   No facility-administered encounter medications on file as of 03/21/2016.     PAST MEDICAL HISTORY:   Past Medical History:  Diagnosis Date  . Anxiety   . Aortic stenosis   . Arthritis    left hip  . Cancer ([place])    basal cell- facial?  . Depression   . GERD (gastroesophageal reflux disease)   . Headache(784.0)   . Heart murmur   . Hernia, inguinal, bilateral, recurrent   . Kidney stones   . Parkinson disease ([place])   . Parkinson's disease (tremor, stiffness, slow motion, unstable posture) ([place]) 12/11/2006  . PONV (postoperative nausea and vomiting)   . Restless leg syndrome   . S/P TAVR (transcatheter  aortic valve replacement) 11/18/2013   26 mm Edwards Sapien XT transcatheter heart valve placed via open right transfemoral approach  . TIA (transient ischemic attack) 02/08/2007    PAST SURGICAL HISTORY:   Past Surgical History:  Procedure Laterality Date  . CARDIAC CATHETERIZATION  10/09/13  . cataract  2008   bilateral  . COLONOSCOPY    . CYSTOSCOPY/RETROGRADE/URETEROSCOPY  05/29/2011   Procedure: CYSTOSCOPY/RETROGRADE/URETEROSCOPY;  Surgeon: Malka So, MD;  [facility]: [facility];  Service: Urology;  Laterality: Left;  no stent   . EYE SURGERY Bilateral    /w IOL  . INGUINAL HERNIA REPAIR Bilateral 03/30/2014   Procedure: OPEN BILATERAL INGUINAL HERNIA REPAIR WITH MESH;  Surgeon: Coralie Keens, MD;  Location: [facility];  Service: General;  Laterality: Bilateral;  . INSERTION OF MESH Bilateral 03/30/2014   Procedure: INSERTION OF MESH;  Surgeon: Coralie Keens, MD;  Location: [facility];  Service: General;  Laterality: Bilateral;  . INTRAOPERATIVE TRANSESOPHAGEAL ECHOCARDIOGRAM N/A 11/18/2013   Procedure: INTRAOPERATIVE TRANSESOPHAGEAL ECHOCARDIOGRAM;  Surgeon: Sherren Mocha, MD;  Location: [facility] OR;  Service: Open Heart Surgery;  Laterality: N/A;  . LEFT AND RIGHT HEART CATHETERIZATION WITH CORONARY ANGIOGRAM N/A 10/09/2013   Procedure: LEFT AND RIGHT HEART CATHETERIZATION WITH CORONARY ANGIOGRAM;  Surgeon: Blane Ohara, MD;  Location: [facility] CATH LAB;  Service: Cardiovascular;  Laterality: N/A;  . TRANSCATHETER AORTIC VALVE REPLACEMENT, TRANSFEMORAL N/A 11/18/2013   Procedure: TRANSCATHETER AORTIC VALVE REPLACEMENT, TRANSFEMORAL;  Surgeon: Sherren Mocha, MD;  Location: [facility];  Service: Open Heart Surgery;  Laterality: N/A;    SOCIAL HISTORY:   Social History   Social History  . Marital status: Widowed    Spouse name: N/A  . Number of children: N/A  . Years of education: N/A   Occupational History  . Not on file.   Social History Main Topics  . Smoking status: Never Smoker  . Smokeless  tobacco: Never Used  . Alcohol use No  . Drug use: No  . Sexual activity: Not on file   Other Topics Concern  . Not on file   Social History Narrative  . No narrative on file    FAMILY HISTORY:   Family Status  Relation Status  . Mother Deceased at age 52  . Father Deceased at age 34  . Daughter   . Daughter   . Daughter   . Daughter   . Daughter   . Son   . Sister Alive  . Sister Alive  . Neg Hx     ROS:  A complete 10 system review of systems was obtained and was unremarkable apart from what is mentioned above.  PHYSICAL EXAMINATION:  VITALS:   Vitals:   03/21/16 1505  BP: 114/70  Pulse: (!) 56  Weight: 129 lb (58.5 kg)  Height: 5' 5.5" (1.664 m)    GEN:  The patient appears stated age and is in NAD. HEENT:  Normocephalic, atraumatic.  The mucous membranes are moist. The superficial temporal arteries are without ropiness or tenderness. CV:  Bradycardic.  Regular rhythm.   Lungs:  CTAB Neck/HEME:  There are no carotid bruits bilaterally.  Neurological examination:  Orientation:  Montreal Cognitive Assessment  12/16/2015  Visuospatial/ Executive (0/5) 1  Naming (0/3) 3  Attention: Read list of digits (0/2) 2  Attention: Read list of letters (0/1) 1  Attention: Serial 7 subtraction starting at 100 (0/3) 2  Language: Repeat phrase (0/2) 2  Language : Fluency (0/1) 0  Abstraction (0/2) 2  Delayed Recall (0/5) 3  Orientation (0/6) 5  Total 21  Adjusted Score (based on education) 21    Cranial nerves: There is good facial symmetry.   Soft palate rises symmetrically and there is no tongue deviation. Hearing is intact to conversational tone. Sensation: Sensation is intact to light touch throughout Motor: Strength is 5/5 in the bilateral upper and lower extremities.   Shoulder shrug is equal and symmetric.  There is no pronator drift.  Movement examination: Tone: There is normal tone throughout Abnormal movements: no tremor noted Coordination:  There is  decremation with RAM's, with any form of RAMS, including alternating supination and pronation of the forearm, hand opening and closing, finger taps, heel taps and toe taps, L much more than right Gait and Station: The patient has no difficulty arising out of a deep-seated chair without the use of the hands. The patient's stride length is decreased with stooped posture and decreased arm swing on the L.    ASSESSMENT/PLAN:  1.  Idiopathic Parkinson's disease.  The patient has tremor, bradykinesia, rigidity and postural instability.  Dx was in 2008 by Dr. Love/Dohmeier but followed by Dr. Maxine Glenn for past several years.  -We discussed the diagnosis as well as pathophysiology of the disease.  We discussed treatment options as well as prognostic indicators.  Patient education was provided.  -continue carbidopa/levodopa 25/100, 2 po tid at 8am/12pm/4pm.    -living alone and need to cautiously watch.  Family calls/stops by multiple times a day.    -handicap placard filled out for daughter/family.  2.  Vasomotor rhinorrhea  -discussed atropine drops and needs to be more consistent with them but not overuse them.    3.  Parkinsons dementia  -safety in home discussed  -on donepezil, 10 mg daily.  Will continue  4.  Follow up is anticipated in the next few months, sooner should new neurologic issues arise.   Much greater than 50% of this visit was spent in counseling and coordinating care.  Total face to face time:  30 min

## 2016-03-21 ENCOUNTER — Encounter: Payer: Self-pay | Admitting: Neurology

## 2016-03-21 ENCOUNTER — Ambulatory Visit (INDEPENDENT_AMBULATORY_CARE_PROVIDER_SITE_OTHER): Payer: Medicare Other | Admitting: Neurology

## 2016-03-21 VITALS — BP 114/70 | HR 56 | Ht 65.5 in | Wt 129.0 lb

## 2016-03-21 DIAGNOSIS — F0280 Dementia in other diseases classified elsewhere without behavioral disturbance: Secondary | ICD-10-CM | POA: Diagnosis not present

## 2016-03-21 DIAGNOSIS — G20 Parkinson's disease: Secondary | ICD-10-CM | POA: Diagnosis not present

## 2016-03-21 MED ORDER — CARBIDOPA-LEVODOPA 25-100 MG PO TABS: 2.0000 | ORAL_TABLET | Freq: Three times a day (TID) | ORAL | 5 refills | Status: DC

## 2016-04-13 DIAGNOSIS — H35363 Drusen (degenerative) of macula, bilateral: Secondary | ICD-10-CM | POA: Diagnosis not present

## 2016-04-13 DIAGNOSIS — H35033 Hypertensive retinopathy, bilateral: Secondary | ICD-10-CM | POA: Diagnosis not present

## 2016-04-13 DIAGNOSIS — H40023 Open angle with borderline findings, high risk, bilateral: Secondary | ICD-10-CM | POA: Diagnosis not present

## 2016-04-13 DIAGNOSIS — Z961 Presence of intraocular lens: Secondary | ICD-10-CM | POA: Diagnosis not present

## 2016-06-07 IMAGING — CT CT HEART MORP W/ CTA COR W/ SCORE W/ CA W/CM &/OR W/O CM
1 of 3 series · 1 of 20 positions shown, 2 images · non-contrast
Comparison: None.

CLINICAL DATA: Aortic stenosis

EXAM:
Cardiac TAVR CT
TECHNIQUE: The patient was scanned on a Philips 256 scanner. A 120 kV
retrospective scan was triggered in the descending thoracic aorta at
111 HU's. Gantry rotation speed was 270 msecs and collimation was .9
mm. No beta blockade or nitro were given. The 3D data set was
reconstructed in 5% intervals of the R-R cycle. Systolic and
diastolic phases were analyzed on a dedicated work station using
MPR, MIP and VRT modes. The patient received 80 cc of contrast.

[Series 200: locator · axial · 0.35mm/px · z∈[-172,-172]mm · 1 of 1 slices shown, 2 images]
[im 1/1  vessel]
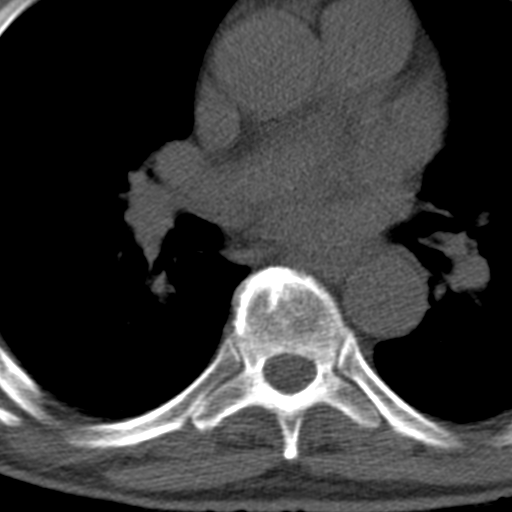
[im 1/1  lung]
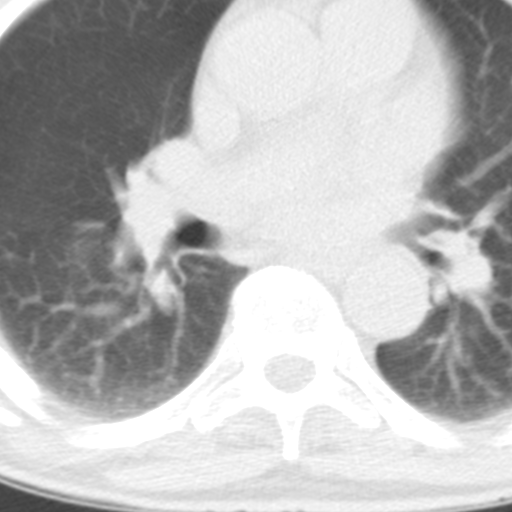

[1 of 20 positions shown; findings below may reference images not displayed]

FINDINGS: Aortic Valve: Severely calcified tricuspid aortic valve with
restricted leaflet opening

Aorta: Minimal calcification in the visualized position of the
ascending and descending thoracic aorta. Moderate calcifications in
the aortic arch that is not completely visualized.

Sinotubular Junction:  27 x 29 mm

Ascending Thoracic Aorta:  36 x 36 mm

Descending Thoracic Aorta:  25 x 26 mm

Sinus of Valsalva Measurements:

Non-coronary:  31 mm

Right -coronary:  30 mm

Left -coronary:  33 mm

Coronary Artery Height above Annulus:

Left Main:  13 mm

Right Coronary:  13 mm

Virtual Basal Annulus Measurements:

Maximum/Minimum Diameter:  28 x 24 mm

Perimeter:  94 mm

Area:  526 mm2

Coronary Arteries: Originating in a regular position. Occluded RCA
at the origin. The study quality insufficient to evaluate for
coronary plague.

Optimum Fluoroscopic Angle for Delivery:  RAO 0 PROSPR 0

Annulus to aortic angle distance 71 mm (for transaortic TAVR
delivery).
IMPRESSION: 1. Severely calcified aortic valve with measurements suitable for 26
mm Jehu Sapien valve.

2.  Sufficient annulus to coronary distance.

3. Optimum Fluoroscopic Angle for Delivery: RAO 0 PROSPR 0

Sandiren Dercy

EXAM:
OVER-READ INTERPRETATION  CT CHEST

The following report is an over-read performed by radiologist Dr.
Saidj Perlo [[email or web address]] on 10/20/2013. This
over-read does not include interpretation of cardiac or coronary
anatomy or pathology. The coronary CTA interpretation by the
cardiologist is attached.
FINDINGS: Mild linear scarring in the right middle lobe and lingula. Minimal
dependent atelectasis in the right lower lobe. No suspicious
pulmonary nodules.

No thoracic lymphadenopathy.

Visualized upper abdomen is unremarkable.

Degenerative changes of the visualized thoracic spine.
IMPRESSION: No significant extracardiac findings.

## 2016-06-20 DIAGNOSIS — H6123 Impacted cerumen, bilateral: Secondary | ICD-10-CM | POA: Diagnosis not present

## 2016-06-20 DIAGNOSIS — H9193 Unspecified hearing loss, bilateral: Secondary | ICD-10-CM | POA: Diagnosis not present

## 2016-07-31 DIAGNOSIS — R350 Frequency of micturition: Secondary | ICD-10-CM | POA: Diagnosis not present

## 2016-07-31 DIAGNOSIS — F418 Other specified anxiety disorders: Secondary | ICD-10-CM | POA: Diagnosis not present

## 2016-07-31 DIAGNOSIS — Z0000 Encounter for general adult medical examination without abnormal findings: Secondary | ICD-10-CM | POA: Diagnosis not present

## 2016-07-31 DIAGNOSIS — G2581 Restless legs syndrome: Secondary | ICD-10-CM | POA: Diagnosis not present

## 2016-07-31 DIAGNOSIS — Z952 Presence of prosthetic heart valve: Secondary | ICD-10-CM | POA: Diagnosis not present

## 2016-07-31 DIAGNOSIS — Z1389 Encounter for screening for other disorder: Secondary | ICD-10-CM | POA: Diagnosis not present

## 2016-07-31 DIAGNOSIS — G20 Parkinson's disease: Secondary | ICD-10-CM | POA: Diagnosis not present

## 2016-08-16 NOTE — Progress Notes (Signed)
[name]. was seen today in the movement disorders clinic for neurologic consultation at the request of Dr. Maxine Glenn.  His PCP is Leeroy Cha, MD.  The consultation is for the evaluation of PD. This patient is accompanied in the office by his sibling and daughter who supplements the history.  I have reviewed numerous records made available to me from Dr. Maxine Glenn through care everywhere.  The patient was diagnosed with Parkinson's disease in approximately 2008, with symptoms dating back to 2006 or 2007.  He recalls that his first symptoms were gait changes and an overall sense that he was slowing down and his voice was becoming softer.  The first medication that he was placed on was Requip and that was started by Dr. Brett Fairy.  He believes that medication was helpful when he was started on it.  He transferred care to Dr. Maxine Glenn in April, 2012.  He was started on levodopa in 2013.  Requip was d/c in august 2014 due to paranoia.  His wife was in the hospital at the time and it seemed to exacerbate the confusion.  He began to develop some memory changes in November, 2015 and was started on donepezil.    He was last seen by Dr. Maxine Glenn in October, 2016, at which point his carbidopa/levodopa 25/100 was increased to 2 tablets 3 times per day.  He takes it at 10am/2pm/6pm but he gets up at 6-7am.  He does this because when his wife was living she was a night owl and he would wait until she would wake up to take med.  He takes med 30 min before the meals.    03/21/16 update: Pt f/u today, accompanied by his daughter who supplements the hx.  On carbidopa/levodopa 25/100, 2 po tid but asked him to move timing of medication to 8am/12am/4pm.  Wrote RX for home PT.  States that it seemed to help some.  Family states that his BP was being monitored and it was low and so he was sent to the cardiologist and his BP medication was d/c.  No falls since our last visit.  No hallucinations.  No lightheadedness or  near syncope.  Gave him atropine gtts last visit as well for nose running and states that he isn't sure it helped.  He wasn't that consistent with it either.  Only exercise he gets is walking in the house.  Still on aricept.  Doing well with swallowing.    08/16/16 update:  Pt f/u today, accompanied by his daughter who supplements the history.  On carbidopa/levodopa 25/100, 2 tablets three times a day.  He fell one time.  He went to sit down on the rocking chair and it rocked and he fell.  He was able to get up without trouble.    No lightheadedness or near syncope.  Is on donepezil for memory change.  He is doing tango for PD.  He has only had 2 lessons.  PREVIOUS MEDICATIONS: Sinemet and Requip  ALLERGIES:   Allergies  Allergen Reactions  . Penicillins     Unsure of reaction  . Vicodin [Hydrocodone-Acetaminophen] Other (See Comments)    "Mental reaction"    CURRENT MEDICATIONS:  Outpatient Encounter Prescriptions as of 08/21/2016  Medication Sig  . acetaminophen (TYLENOL) 500 MG tablet Take 1,000 mg by mouth 2 (two) times daily as needed (pain). For pain  . aspirin EC 81 MG tablet Take by mouth.  . carbidopa-levodopa (SINEMET IR) 25-100 MG tablet Take 2 tablets  by mouth 3 (three) times daily.  . cholecalciferol (VITAMIN D) 1000 UNITS tablet Take 1,000 Units by mouth at bedtime.   . clonazePAM (KLONOPIN) 1 MG tablet Take 1 mg by mouth at bedtime.   . donepezil (ARICEPT) 10 MG tablet Take 1 tablet (10 mg total) by mouth at bedtime.  Marland Kitchen omeprazole (PRILOSEC) 20 MG capsule Take 20 mg by mouth at bedtime.   . sertraline (ZOLOFT) 50 MG tablet Take by mouth.  . [DISCONTINUED] atropine 1 % ophthalmic solution Use as directed  . [DISCONTINUED] LORazepam (ATIVAN) 0.5 MG tablet Take 0.5 mg by mouth as directed.    No facility-administered encounter medications on file as of 08/21/2016.     PAST MEDICAL HISTORY:   Past Medical History:  Diagnosis Date  . Anxiety   . Aortic stenosis   . Arthritis     left hip  . Cancer ([place])    basal cell- facial?  . Depression   . GERD (gastroesophageal reflux disease)   . Headache(784.0)   . Heart murmur   . Hernia, inguinal, bilateral, recurrent   . Kidney stones   . Parkinson disease ([place])   . Parkinson's disease (tremor, stiffness, slow motion, unstable posture) ([place]) 12/11/2006  . PONV (postoperative nausea and vomiting)   . Restless leg syndrome   . S/P TAVR (transcatheter aortic valve replacement) 11/18/2013   26 mm Edwards Sapien XT transcatheter heart valve placed via open right transfemoral approach  . TIA (transient ischemic attack) 02/08/2007    PAST SURGICAL HISTORY:   Past Surgical History:  Procedure Laterality Date  . CARDIAC CATHETERIZATION  10/09/13  . cataract  2008   bilateral  . COLONOSCOPY    . CYSTOSCOPY/RETROGRADE/URETEROSCOPY  05/29/2011   Procedure: CYSTOSCOPY/RETROGRADE/URETEROSCOPY;  Surgeon: Malka So, MD;  Location: [facility];  Service: Urology;  Laterality: Left;  no stent   . EYE SURGERY Bilateral    /w IOL  . INGUINAL HERNIA REPAIR Bilateral 03/30/2014   Procedure: OPEN BILATERAL INGUINAL HERNIA REPAIR WITH MESH;  Surgeon: Coralie Keens, MD;  Location: [place];  Service: General;  Laterality: Bilateral;  . INSERTION OF MESH Bilateral 03/30/2014   Procedure: INSERTION OF MESH;  Surgeon: Coralie Keens, MD;  Location: [place];  Service: General;  Laterality: Bilateral;  . INTRAOPERATIVE TRANSESOPHAGEAL ECHOCARDIOGRAM N/A 11/18/2013   Procedure: INTRAOPERATIVE TRANSESOPHAGEAL ECHOCARDIOGRAM;  Surgeon: Sherren Mocha, MD;  Location: [facility] OR;  Service: Open Heart Surgery;  Laterality: N/A;  . LEFT AND RIGHT HEART CATHETERIZATION WITH CORONARY ANGIOGRAM N/A 10/09/2013   Procedure: LEFT AND RIGHT HEART CATHETERIZATION WITH CORONARY ANGIOGRAM;  Surgeon: Blane Ohara, MD;  Location[facility] CATH LAB;  Service: Cardiovascular;  Laterality: N/A;  . TRANSCATHETER AORTIC VALVE REPLACEMENT, TRANSFEMORAL N/A 11/18/2013   Procedure:  TRANSCATHETER AORTIC VALVE REPLACEMENT, TRANSFEMORAL;  Surgeon: Sherren Mocha, MD;  Location: [place];  Service: Open Heart Surgery;  Laterality: N/A;    SOCIAL HISTORY:   Social History   Social History  . Marital status: Widowed    Spouse name: N/A  . Number of children: N/A  . Years of education: N/A   Occupational History  . Not on file.   Social History Main Topics  . Smoking status: Never Smoker  . Smokeless tobacco: Never Used  . Alcohol use No  . Drug use: No  . Sexual activity: Not on file   Other Topics Concern  . Not on file   Social History Narrative  . No narrative on file    FAMILY HISTORY:  Family Status  Relation Status  . Mother Deceased at age 15  . Father Deceased at age 46  . Daughter   . Daughter   . Daughter   . Daughter   . Daughter   . Son   . Sister Alive  . Sister Alive  . Neg Hx     ROS:  A complete 10 system review of systems was obtained and was unremarkable apart from what is mentioned above.  PHYSICAL EXAMINATION:    VITALS:   Vitals:   08/21/16 1407  BP: 108/60  Pulse: 62  SpO2: 98%  Weight: 135 lb (61.2 kg)  Height: 5' 5.5" (1.664 m)    GEN:  The patient appears stated age and is in NAD. HEENT:  Normocephalic, atraumatic.  The mucous membranes are moist. The superficial temporal arteries are without ropiness or tenderness. CV:  Bradycardic.  Regular rhythm.   Lungs:  CTAB Neck/HEME:  There are no carotid bruits bilaterally.  Neurological examination:  Orientation:  Montreal Cognitive Assessment  12/16/2015  Visuospatial/ Executive (0/5) 1  Naming (0/3) 3  Attention: Read list of digits (0/2) 2  Attention: Read list of letters (0/1) 1  Attention: Serial 7 subtraction starting at 100 (0/3) 2  Language: Repeat phrase (0/2) 2  Language : Fluency (0/1) 0  Abstraction (0/2) 2  Delayed Recall (0/5) 3  Orientation (0/6) 5  Total 21  Adjusted Score (based on education) 21    Cranial nerves: There is good facial  symmetry.   Soft palate rises symmetrically and there is no tongue deviation. Hearing is intact to conversational tone. Sensation: Sensation is intact to light touch throughout Motor: Strength is 5/5 in the bilateral upper and lower extremities.   Shoulder shrug is equal and symmetric.  There is no pronator drift.  Movement examination: Tone: There is normal tone throughout Abnormal movements: no tremor noted Coordination:  There is decremation with RAM's, with any form of RAMS, including alternating supination and pronation of the forearm, hand opening and closing, finger taps, heel taps and toe taps, L much more than right Gait and Station: The patient has no difficulty arising out of a deep-seated chair without the use of the hands. The patient's stride length is decreased with stooped posture and decreased arm swing on the L.    ASSESSMENT/PLAN:  1.  Idiopathic Parkinson's disease.  The patient has tremor, bradykinesia, rigidity and postural instability.  Dx was in 2008 by Dr. Love/Dohmeier but followed by Dr. Maxine Glenn for past several years.  -We discussed the diagnosis as well as pathophysiology of the disease.  We discussed treatment options as well as prognostic indicators.  Patient education was provided.  -continue carbidopa/levodopa 25/100, 2 po tid at 8am/12pm/4pm.    -living alone and need to cautiously watch.  Family calls/stops by multiple times a day.    -doing tango at [facility]  -info given on rock steady boxing at [facility] [employer].  To get cardiology clearance if wants to proceed.  Talked about getting back on stationary bike as well.  2.  Vasomotor rhinorrhea  -discussed atropine drops and needs to be more consistent with them but not overuse them.    3.  Parkinsons dementia  -safety in home discussed  -on donepezil, 10 mg daily.  Will continue  4.  Follow up is anticipated in the next few months, sooner should new neurologic issues arise.   Much greater than 50% of this visit  was spent in counseling and coordinating care.  Total face to face time:  25 min

## 2016-08-21 ENCOUNTER — Encounter: Payer: Self-pay | Admitting: Neurology

## 2016-08-21 ENCOUNTER — Ambulatory Visit (INDEPENDENT_AMBULATORY_CARE_PROVIDER_SITE_OTHER): Payer: Medicare Other | Admitting: Neurology

## 2016-08-21 VITALS — BP 108/60 | HR 62 | Ht 65.5 in | Wt 135.0 lb

## 2016-08-21 DIAGNOSIS — G20A1 Parkinson's disease without dyskinesia, without mention of fluctuations: Secondary | ICD-10-CM

## 2016-08-21 DIAGNOSIS — G20 Parkinson's disease: Secondary | ICD-10-CM

## 2016-08-21 DIAGNOSIS — F0280 Dementia in other diseases classified elsewhere without behavioral disturbance: Secondary | ICD-10-CM | POA: Diagnosis not present

## 2016-08-21 NOTE — Progress Notes (Signed)
Clinical Social Work Note  CSW received request to meet with pt and pt daughter during today's follow up visit. CSW met with pt and pt daughter in exam room. CSW introduced self and explained role. Pt requesting resources surrounding [employer] at [facility]. Pt reports that he attends Parkinson support group at [facility] and [place] for PD classes that recently started at [facility]. CSW discussed [employer] and provided information on dates and times of classes and contact person. Pt discussed that he enjoys Tango classes and interested in [employer]. Pt daughter and pt sister a supportive in pt care and encourage continued participation in activities. CSW provided CSW contact information for any further social work needs or further questions surrounding [employer].   Alison Murray, MSW, LCSW Clinical Social Worker Movement [facility] [phone]

## 2016-10-11 DIAGNOSIS — H04213 Epiphora due to excess lacrimation, bilateral lacrimal glands: Secondary | ICD-10-CM | POA: Diagnosis not present

## 2016-10-11 DIAGNOSIS — H04123 Dry eye syndrome of bilateral lacrimal glands: Secondary | ICD-10-CM | POA: Diagnosis not present

## 2016-10-11 DIAGNOSIS — H40023 Open angle with borderline findings, high risk, bilateral: Secondary | ICD-10-CM | POA: Diagnosis not present

## 2016-10-11 DIAGNOSIS — H01003 Unspecified blepharitis right eye, unspecified eyelid: Secondary | ICD-10-CM | POA: Diagnosis not present

## 2016-10-30 DIAGNOSIS — D3610 Benign neoplasm of peripheral nerves and autonomic nervous system, unspecified: Secondary | ICD-10-CM | POA: Diagnosis not present

## 2016-10-30 DIAGNOSIS — D225 Melanocytic nevi of trunk: Secondary | ICD-10-CM | POA: Diagnosis not present

## 2016-10-30 DIAGNOSIS — L570 Actinic keratosis: Secondary | ICD-10-CM | POA: Diagnosis not present

## 2016-10-30 DIAGNOSIS — Z85828 Personal history of other malignant neoplasm of skin: Secondary | ICD-10-CM | POA: Diagnosis not present

## 2016-10-30 DIAGNOSIS — L720 Epidermal cyst: Secondary | ICD-10-CM | POA: Diagnosis not present

## 2016-12-21 ENCOUNTER — Other Ambulatory Visit: Payer: Self-pay | Admitting: Neurology

## 2016-12-22 ENCOUNTER — Other Ambulatory Visit: Payer: Self-pay | Admitting: *Deleted

## 2016-12-22 MED ORDER — CARBIDOPA-LEVODOPA 25-100 MG PO TABS: 2.0000 | ORAL_TABLET | Freq: Three times a day (TID) | ORAL | 5 refills | Status: DC

## 2016-12-25 NOTE — Progress Notes (Signed)
[name]. was seen today in the movement disorders clinic for neurologic consultation at the request of Dr. Maxine Holmes.  His PCP is Bryce Cha, MD.  The consultation is for the evaluation of PD. This patient is accompanied in the office by his sibling and daughter who supplements the history.  I have reviewed numerous records made available to me from Dr. Maxine Holmes through care everywhere.  The patient was diagnosed with Parkinson's disease in approximately 2008, with symptoms dating back to 2006 or 2007.  He recalls that his first symptoms were gait changes and an overall sense that he was slowing down and his voice was becoming softer.  The first medication that he was placed on was Requip and that was started by Dr. Brett Holmes.  He believes that medication was helpful when he was started on it.  He transferred care to Dr. Maxine Holmes in April, 2012.  He was started on levodopa in 2013.  Requip was d/c in august 2014 due to paranoia.  His wife was in the hospital at the time and it seemed to exacerbate the confusion.  He began to develop some memory changes in November, 2015 and was started on donepezil.    He was last seen by Dr. Maxine Holmes in October, 2016, at which point his carbidopa/levodopa 25/100 was increased to 2 tablets 3 times per day.  He takes it at 10am/2pm/6pm but he gets up at 6-7am.  He does this because when his wife was living she was a night owl and he would wait until she would wake up to take med.  He takes med 30 min before the meals.    03/21/16 update: Pt f/u today, accompanied by his daughter who supplements the hx.  On carbidopa/levodopa 25/100, 2 po tid but asked him to move timing of medication to 8am/12am/4pm.  Wrote RX for home PT.  States that it seemed to help some.  Family states that his BP was being monitored and it was low and Holmes he was sent to the cardiologist and his BP medication was d/c.  No falls since our last visit.  No hallucinations.  No lightheadedness or  near syncope.  Gave him atropine gtts last visit as well for nose running and states that he isn't sure it helped.  He wasn't that consistent with it either.  Only exercise he gets is walking in the house.  Still on aricept.  Doing well with swallowing.    08/16/16 update:  Pt f/u today, accompanied by his daughter who supplements the history.  On carbidopa/levodopa 25/100, 2 tablets three times a day.  He fell one time.  He went to sit down on the rocking chair and it rocked and he fell.  He was able to get up without trouble.    No lightheadedness or near syncope.  Is on donepezil for memory change.  He is doing tango for PD.  He has only had 2 lessons.  12/26/16 update:  Patient seen in follow-up today for his Parkinson's disease, accompanied by his daughter who supplements the history.  The patient remains on carbidopa/levodopa 25/100, 2 tablets 3 times per day.  He has been doing about the same since our last visit.  Pt denies falls.  Pt denies lightheadedness, near syncope.  No hallucinations.  Mood has been good.  He remains on donepezil for memory change. 77 y/o grandson living with him temporarily.   In regards to vasomotor rhinorrhea and the use of atropine drops, he  states that the medication wasn't helping and he d/c that.  His biggest c/o is paresthesias in the lower legs.  It seemed to start after TAVR procedure in 2015 but is very bothersome for him.  PREVIOUS MEDICATIONS: Sinemet and Requip  ALLERGIES:   Allergies  Allergen Reactions  . Penicillins     Unsure of reaction  . Vicodin [Hydrocodone-Acetaminophen] Other (See Comments)    "Mental reaction"    CURRENT MEDICATIONS:  Outpatient Encounter Prescriptions as of 12/26/2016  Medication Sig  . acetaminophen (TYLENOL) 500 MG tablet Take 1,000 mg by mouth 2 (two) times daily as needed (pain). For pain  . aspirin EC 81 MG tablet Take by mouth.  . carbidopa-levodopa (SINEMET IR) 25-100 MG tablet TAKE 2 TABLETS BY MOUTH THREE TIMES A  DAY  . cholecalciferol (VITAMIN D) 1000 UNITS tablet Take 1,000 Units by mouth at bedtime.   . clonazePAM (KLONOPIN) 1 MG tablet Take 1 mg by mouth at bedtime.   . donepezil (ARICEPT) 10 MG tablet Take 1 tablet (10 mg total) by mouth at bedtime.  . IBUPROFEN PO Take by mouth as needed.  Marland Kitchen omeprazole (PRILOSEC) 20 MG capsule Take 20 mg by mouth at bedtime.   . sertraline (ZOLOFT) 50 MG tablet Take by mouth.  . gabapentin (NEURONTIN) 100 MG capsule Take 1 capsule (100 mg total) by mouth 2 (two) times daily.  . [DISCONTINUED] carbidopa-levodopa (SINEMET IR) 25-100 MG tablet Take 2 tablets by mouth 3 (three) times daily.   No facility-administered encounter medications on file as of 12/26/2016.     PAST MEDICAL HISTORY:   Past Medical History:  Diagnosis Date  . Anxiety   . Aortic stenosis   . Arthritis    left hip  . Cancer ([place])    basal cell- facial?  . Depression   . GERD (gastroesophageal reflux disease)   . Headache(784.0)   . Heart murmur   . Hernia, inguinal, bilateral, recurrent   . Kidney stones   . Parkinson disease ([place])   . Parkinson's disease (tremor, stiffness, slow motion, unstable posture) ([place]) 12/11/2006  . PONV (postoperative nausea and vomiting)   . Restless leg syndrome   . S/P TAVR (transcatheter aortic valve replacement) 11/18/2013   26 mm Edwards Sapien XT transcatheter heart valve placed via open right transfemoral approach  . TIA (transient ischemic attack) 02/08/2007    PAST SURGICAL HISTORY:   Past Surgical History:  Procedure Laterality Date  . CARDIAC CATHETERIZATION  10/09/13  . cataract  2008   bilateral  . COLONOSCOPY    . CYSTOSCOPY/RETROGRADE/URETEROSCOPY  05/29/2011   Procedure: CYSTOSCOPY/RETROGRADE/URETEROSCOPY;  Surgeon: Bryce So, MD;  [facility]: [facility];  Service: Urology;  Laterality: Left;  no stent   . EYE SURGERY Bilateral    /w IOL  . INGUINAL HERNIA REPAIR Bilateral 03/30/2014   Procedure: OPEN BILATERAL INGUINAL HERNIA REPAIR  WITH MESH;  Surgeon: Bryce Keens, MD;  Location: [place];  Service: General;  Laterality: Bilateral;  . INSERTION OF MESH Bilateral 03/30/2014   Procedure: INSERTION OF MESH;  Surgeon: Bryce Keens, MD;  Location: [place];  Service: General;  Laterality: Bilateral;  . INTRAOPERATIVE TRANSESOPHAGEAL ECHOCARDIOGRAM N/A 11/18/2013   Procedure: INTRAOPERATIVE TRANSESOPHAGEAL ECHOCARDIOGRAM;  Surgeon: Sherren Mocha, MD;  [facility];  Service: Open Heart Surgery;  Laterality: N/A;  . LEFT AND RIGHT HEART CATHETERIZATION WITH CORONARY ANGIOGRAM N/A 10/09/2013   Procedure: LEFT AND RIGHT HEART CATHETERIZATION WITH CORONARY ANGIOGRAM;  Surgeon: Blane Ohara, MD;  Location: [facility]  CATH LAB;  Service: Cardiovascular;  Laterality: N/A;  . TRANSCATHETER AORTIC VALVE REPLACEMENT, TRANSFEMORAL N/A 11/18/2013   Procedure: TRANSCATHETER AORTIC VALVE REPLACEMENT, TRANSFEMORAL;  Surgeon: Sherren Mocha, MD;  Location: [place];  Service: Open Heart Surgery;  Laterality: N/A;    SOCIAL HISTORY:   Social History   Social History  . Marital status: Widowed    Spouse name: N/A  . Number of children: N/A  . Years of education: N/A   Occupational History  . Not on file.   Social History Main Topics  . Smoking status: Never Smoker  . Smokeless tobacco: Never Used  . Alcohol use No  . Drug use: No  . Sexual activity: Not on file   Other Topics Concern  . Not on file   Social History Narrative  . No narrative on file    FAMILY HISTORY:   Family Status  Relation Status  . Mother Deceased at age 62  . Father Deceased at age 20  . Daughter (Not Specified)  . Daughter (Not Specified)  . Daughter (Not Specified)  . Daughter (Not Specified)  . Daughter (Not Specified)  . Son (Not Specified)  . Sister Alive  . Sister Alive  . Neg Hx (Not Specified)    ROS:  A complete 10 system review of systems was obtained and was unremarkable apart from what is mentioned above.  PHYSICAL EXAMINATION:     VITALS:   Vitals:   12/26/16 1502  BP: 120/60  Pulse: 60  SpO2: 96%  Weight: 134 lb (60.8 kg)  Height: 5' 5.5" (1.664 m)    GEN:  The patient appears stated age and is in NAD. HEENT:  Normocephalic, atraumatic.  The mucous membranes are moist. The superficial temporal arteries are without ropiness or tenderness. CV:  Bradycardic.  Regular rhythm.   Lungs:  CTAB Neck/HEME:  There are no carotid bruits bilaterally.  Neurological examination:  Orientation:  Montreal Cognitive Assessment  12/26/2016 12/16/2015  Visuospatial/ Executive (0/5) 2 1  Naming (0/3) 3 3  Attention: Read list of digits (0/2) 1 2  Attention: Read list of letters (0/1) 1 1  Attention: Serial 7 subtraction starting at 100 (0/3) 1 2  Language: Repeat phrase (0/2) 2 2  Language : Fluency (0/1) 0 0  Abstraction (0/2) 2 2  Delayed Recall (0/5) 4 3  Orientation (0/6) 5 5  Total 21 21  Adjusted Score (based on education) 21 21    Cranial nerves: There is good facial symmetry.   Soft palate rises symmetrically and there is no tongue deviation. Hearing is intact to conversational tone. Sensation: Sensation is intact to light touch throughout.  Vibration is decreased distally. Motor: Strength is at least antigravity 4.  Movement examination: Tone: There is normal tone throughout Abnormal movements: no tremor noted Coordination:  There is some decremation, mostly with alternation of supination and pronation of the forearm. Gait and Station: The patient has no difficulty arising out of a deep-seated chair without the use of the hands. The patient's stride length is decreased with stooped posture and decreased arm swing on the L.  he ambulates with his cane.  ASSESSMENT/PLAN:  1.  Idiopathic Parkinson's disease.  The patient has tremor, bradykinesia, rigidity and postural instability.  Dx was in 2008 by Dr. Love/Dohmeier but followed by Dr. Maxine Holmes for past several years.  -We discussed the diagnosis as well as  pathophysiology of the disease.  We discussed treatment options as well as prognostic indicators.  Patient education was  provided.  -continue carbidopa/levodopa 25/100, 2 po tid at 8am/12pm/4pm.    -living alone and need to cautiously watch.  Family calls/stops by multiple times a day.    -doing tango at [facility]  2.  Vasomotor rhinorrhea  -He did not find atropine drops helpful and he has discontinued those.    3.  Parkinsons dementia  -safety in home discussed  -on donepezil, 10 mg daily.  Will continue  4.  Paresthesias, likely a component of neuropathy.  -We will cautiously start gabapentin.  We will have him take 100 mg at night for a week and then as long as he has no symptoms and will increase to 100 mg twice a day.  We will call him in about a month and if he is still having symptoms we can go up from there, as long as no side effects.  We discussed risks, benefits, and side effects.  5.  Follow up is anticipated in the next few months, sooner should new neurologic issues arise.   Much greater than 50% of this visit was spent in counseling and coordinating care.  Total face to face time:  30 min

## 2016-12-26 ENCOUNTER — Ambulatory Visit (INDEPENDENT_AMBULATORY_CARE_PROVIDER_SITE_OTHER): Payer: Medicare Other | Admitting: Neurology

## 2016-12-26 ENCOUNTER — Encounter: Payer: Self-pay | Admitting: Neurology

## 2016-12-26 VITALS — BP 120/60 | HR 60 | Ht 65.5 in | Wt 134.0 lb

## 2016-12-26 DIAGNOSIS — F0280 Dementia in other diseases classified elsewhere without behavioral disturbance: Secondary | ICD-10-CM | POA: Diagnosis not present

## 2016-12-26 DIAGNOSIS — G20A1 Parkinson's disease without dyskinesia, without mention of fluctuations: Secondary | ICD-10-CM

## 2016-12-26 DIAGNOSIS — G20 Parkinson's disease: Secondary | ICD-10-CM | POA: Diagnosis not present

## 2016-12-26 MED ORDER — GABAPENTIN 100 MG PO CAPS: 100.0000 mg | ORAL_CAPSULE | Freq: Two times a day (BID) | ORAL | 1 refills | Status: DC

## 2016-12-26 NOTE — Patient Instructions (Signed)
1.  Start gabapentin - 100 mg at night for a week and then increase to twice per day.  We will call you in a few weeks to see how you are doing and if you are still having symptoms in the legs, we will increase the dosage (if you don't have side effects)

## 2016-12-26 NOTE — Progress Notes (Signed)
[name]. was seen today in the movement disorders clinic for neurologic consultation at the request of Dr. Maxine Glenn.  His PCP is Leeroy Cha, MD.  The consultation is for the evaluation of PD. This patient is accompanied in the office by his sibling and daughter who supplements the history.  I have reviewed numerous records made available to me from Dr. Maxine Glenn through care everywhere.  The patient was diagnosed with Parkinson's disease in approximately 2008, with symptoms dating back to 2006 or 2007.  He recalls that his first symptoms were gait changes and an overall sense that he was slowing down and his voice was becoming softer.  The first medication that he was placed on was Requip and that was started by Dr. Brett Fairy.  He believes that medication was helpful when he was started on it.  He transferred care to Dr. Maxine Glenn in April, 2012.  He was started on levodopa in 2013.  Requip was d/c in august 2014 due to paranoia.  His wife was in the hospital at the time and it seemed to exacerbate the confusion.  He began to develop some memory changes in November, 2015 and was started on donepezil.    He was last seen by Dr. Maxine Glenn in October, 2016, at which point his carbidopa/levodopa 25/100 was increased to 2 tablets 3 times per day.  He takes it at 10am/2pm/6pm but he gets up at 6-7am.  He does this because when his wife was living she was a night owl and he would wait until she would wake up to take med.  He takes med 30 min before the meals.    03/21/16 update: Pt f/u today, accompanied by his daughter who supplements the hx.  On carbidopa/levodopa 25/100, 2 po tid but asked him to move timing of medication to 8am/12am/4pm.  Wrote RX for home PT.  States that it seemed to help some.  Family states that his BP was being monitored and it was low and so he was sent to the cardiologist and his BP medication was d/c.  No falls since our last visit.  No hallucinations.  No lightheadedness or  near syncope.  Gave him atropine gtts last visit as well for nose running and states that he isn't sure it helped.  He wasn't that consistent with it either.  Only exercise he gets is walking in the house.  Still on aricept.  Doing well with swallowing.    08/16/16 update:  Pt f/u today, accompanied by his daughter who supplements the history.  On carbidopa/levodopa 25/100, 2 tablets three times a day.  He fell one time.  He went to sit down on the rocking chair and it rocked and he fell.  He was able to get up without trouble.    No lightheadedness or near syncope.  Is on donepezil for memory change.  He is doing tango for PD.  He has only had 2 lessons.  12/26/16 update:  Patient seen in follow-up today for his Parkinson's disease, accompanied by his daughter who supplements the history.  The patient remains on carbidopa/levodopa 25/100, 2 tablets 3 times per day.  He has been doing about the same since our last visit.  Pt denies falls.  Pt denies lightheadedness, near syncope.  No hallucinations.  Mood has been good.  He remains on donepezil for memory change.  In regards to vasomotor rhinorrhea and the use of atropine drops, he states that he isn't using those much.  He didn't think that they were helping much.   He is having paresthesias in the bilateral LE, right more than left.  It seemed to start after the TAVR procedure in 2015.  PREVIOUS MEDICATIONS: Sinemet and Requip  ALLERGIES:   Allergies  Allergen Reactions  . Penicillins     Unsure of reaction  . Vicodin [Hydrocodone-Acetaminophen] Other (See Comments)    "Mental reaction"    CURRENT MEDICATIONS:  Outpatient Encounter Prescriptions as of 12/26/2016  Medication Sig  . acetaminophen (TYLENOL) 500 MG tablet Take 1,000 mg by mouth 2 (two) times daily as needed (pain). For pain  . aspirin EC 81 MG tablet Take by mouth.  . carbidopa-levodopa (SINEMET IR) 25-100 MG tablet TAKE 2 TABLETS BY MOUTH THREE TIMES A DAY  . cholecalciferol  (VITAMIN D) 1000 UNITS tablet Take 1,000 Units by mouth at bedtime.   . clonazePAM (KLONOPIN) 1 MG tablet Take 1 mg by mouth at bedtime.   . donepezil (ARICEPT) 10 MG tablet Take 1 tablet (10 mg total) by mouth at bedtime.  . IBUPROFEN PO Take by mouth as needed.  Marland Kitchen omeprazole (PRILOSEC) 20 MG capsule Take 20 mg by mouth at bedtime.   . sertraline (ZOLOFT) 50 MG tablet Take by mouth.  . gabapentin (NEURONTIN) 100 MG capsule Take 1 capsule (100 mg total) by mouth 2 (two) times daily.  . [DISCONTINUED] carbidopa-levodopa (SINEMET IR) 25-100 MG tablet Take 2 tablets by mouth 3 (three) times daily.   No facility-administered encounter medications on file as of 12/26/2016.     PAST MEDICAL HISTORY:   Past Medical History:  Diagnosis Date  . Anxiety   . Aortic stenosis   . Arthritis    left hip  . Cancer ([place])    basal cell- facial?  . Depression   . GERD (gastroesophageal reflux disease)   . Headache(784.0)   . Heart murmur   . Hernia, inguinal, bilateral, recurrent   . Kidney stones   . Parkinson disease ([place])   . Parkinson's disease (tremor, stiffness, slow motion, unstable posture) ([place]) 12/11/2006  . PONV (postoperative nausea and vomiting)   . Restless leg syndrome   . S/P TAVR (transcatheter aortic valve replacement) 11/18/2013   26 mm Edwards Sapien XT transcatheter heart valve placed via open right transfemoral approach  . TIA (transient ischemic attack) 02/08/2007    PAST SURGICAL HISTORY:   Past Surgical History:  Procedure Laterality Date  . CARDIAC CATHETERIZATION  10/09/13  . cataract  2008   bilateral  . COLONOSCOPY    . CYSTOSCOPY/RETROGRADE/URETEROSCOPY  05/29/2011   Procedure: CYSTOSCOPY/RETROGRADE/URETEROSCOPY;  Surgeon: Malka So, MD;  Location: [facility];  Service: Urology;  Laterality: Left;  no stent   . EYE SURGERY Bilateral    /w IOL  . INGUINAL HERNIA REPAIR Bilateral 03/30/2014   Procedure: OPEN BILATERAL INGUINAL HERNIA REPAIR WITH MESH;  Surgeon:  Coralie Keens, MD;  Location: [facility];  Service: General;  Laterality: Bilateral;  . INSERTION OF MESH Bilateral 03/30/2014   Procedure: INSERTION OF MESH;  Surgeon: Coralie Keens, MD;  Location: [facility];  Service: General;  Laterality: Bilateral;  . INTRAOPERATIVE TRANSESOPHAGEAL ECHOCARDIOGRAM N/A 11/18/2013   Procedure: INTRAOPERATIVE TRANSESOPHAGEAL ECHOCARDIOGRAM;  Surgeon: Sherren Mocha, MD;  Location: [facility] OR;  Service: Open Heart Surgery;  Laterality: N/A;  . LEFT AND RIGHT HEART CATHETERIZATION WITH CORONARY ANGIOGRAM N/A 10/09/2013   Procedure: LEFT AND RIGHT HEART CATHETERIZATION WITH CORONARY ANGIOGRAM;  Surgeon: Blane Ohara, MD;  Location: [facility] CATH LAB;  Service: Cardiovascular;  Laterality: N/A;  . TRANSCATHETER AORTIC VALVE REPLACEMENT, TRANSFEMORAL N/A 11/18/2013   Procedure: TRANSCATHETER AORTIC VALVE REPLACEMENT, TRANSFEMORAL;  Surgeon: Sherren Mocha, MD;  Location: [place];  Service: Open Heart Surgery;  Laterality: N/A;    SOCIAL HISTORY:   Social History   Social History  . Marital status: Widowed    Spouse name: N/A  . Number of children: N/A  . Years of education: N/A   Occupational History  . Not on file.   Social History Main Topics  . Smoking status: Never Smoker  . Smokeless tobacco: Never Used  . Alcohol use No  . Drug use: No  . Sexual activity: Not on file   Other Topics Concern  . Not on file   Social History Narrative  . No narrative on file    FAMILY HISTORY:   Family Status  Relation Status  . Mother Deceased at age 58  . Father Deceased at age 62  . Daughter (Not Specified)  . Daughter (Not Specified)  . Daughter (Not Specified)  . Daughter (Not Specified)  . Daughter (Not Specified)  . Son (Not Specified)  . Sister Alive  . Sister Alive  . Neg Hx (Not Specified)    ROS:  A complete 10 system review of systems was obtained and was unremarkable apart from what is mentioned above.  PHYSICAL EXAMINATION:    VITALS:   Vitals:    12/26/16 1502  BP: 120/60  Pulse: 60  SpO2: 96%  Weight: 134 lb (60.8 kg)  Height: 5' 5.5" (1.664 m)    GEN:  The patient appears stated age and is in NAD. HEENT:  Normocephalic, atraumatic.  The mucous membranes are moist. The superficial temporal arteries are without ropiness or tenderness. CV:  Bradycardic.  Regular rhythm.   Lungs:  CTAB Neck/HEME:  There are no carotid bruits bilaterally.  Neurological examination:  Orientation:  Montreal Cognitive Assessment  12/26/2016 12/16/2015  Visuospatial/ Executive (0/5) 2 1  Naming (0/3) 3 3  Attention: Read list of digits (0/2) 1 2  Attention: Read list of letters (0/1) 1 1  Attention: Serial 7 subtraction starting at 100 (0/3) 1 2  Language: Repeat phrase (0/2) 2 2  Language : Fluency (0/1) 0 0  Abstraction (0/2) 2 2  Delayed Recall (0/5) 4 3  Orientation (0/6) 5 5  Total 21 21  Adjusted Score (based on education) 21 21    Cranial nerves: There is good facial symmetry.   Soft palate rises symmetrically and there is no tongue deviation. Hearing is intact to conversational tone. Sensation: Sensation is intact to light touch throughout.  Vibration is decreased distally.   Motor: Strength is 5/5 in the bilateral upper and lower extremities.   Shoulder shrug is equal and symmetric.  There is no pronator drift.  Movement examination: Tone: There is normal tone throughout Abnormal movements: no tremor noted Coordination:  There is decremation with RAM's, mostly with alternation of supination of the forearm bilaterally. Gait and Station: The patient has no difficulty arising out of a deep-seated chair without the use of the hands. The patient's stride length is decreased with stooped posture and decreased arm swing on the L.  He is using his cane.  ASSESSMENT/PLAN:  1.  Idiopathic Parkinson's disease.  The patient has tremor, bradykinesia, rigidity and postural instability.  Dx was in 2008 by Dr. Love/Dohmeier but followed by Dr.  Maxine Glenn for past several years.  -We discussed the diagnosis as well as pathophysiology  of the disease.  We discussed treatment options as well as prognostic indicators.  Patient education was provided.  -continue carbidopa/levodopa 25/100, 2 po tid at 8am/12pm/4pm.    -living alone and need to cautiously watch.  Family calls/stops by multiple times a day and 58 y/o nephew currently living with him.  -doing tango at [employer]  2.  Vasomotor rhinorrhea  -didn't find the atropine gtts all that helpful and he d/c  3.  Parkinsons dementia  -safety in home discussed  -on donepezil, 10 mg daily.  Will continue  4.  Paresthesias  -likely due to peripheral neuropathy.  Will try low dose gabapentin 100 mg bid.  Risks, benefits, side effects and alternative therapies were discussed.  The opportunity to ask questions was given and they were answered to the best of my ability.  The patient expressed understanding and willingness to follow the outlined treatment protocols.  He will let me know if it doesn't help and as a long as no SE, we can slowly increase the dosage.   5. Much greater than 50% of this visit was spent in counseling and coordinating care.  Total face to face time:  30 min

## 2017-02-02 ENCOUNTER — Encounter: Payer: Self-pay | Admitting: Neurology

## 2017-03-06 DIAGNOSIS — Z23 Encounter for immunization: Secondary | ICD-10-CM | POA: Diagnosis not present

## 2017-04-17 DIAGNOSIS — H40023 Open angle with borderline findings, high risk, bilateral: Secondary | ICD-10-CM | POA: Diagnosis not present

## 2017-04-17 DIAGNOSIS — H04213 Epiphora due to excess lacrimation, bilateral lacrimal glands: Secondary | ICD-10-CM | POA: Diagnosis not present

## 2017-04-17 DIAGNOSIS — Z961 Presence of intraocular lens: Secondary | ICD-10-CM | POA: Diagnosis not present

## 2017-04-17 DIAGNOSIS — H04123 Dry eye syndrome of bilateral lacrimal glands: Secondary | ICD-10-CM | POA: Diagnosis not present

## 2017-04-20 ENCOUNTER — Other Ambulatory Visit: Payer: Self-pay | Admitting: Neurology

## 2017-05-02 NOTE — Progress Notes (Signed)
[name]. was seen today in the movement disorders clinic for neurologic consultation at the request of Dr. Maxine Glenn.  His PCP is Leeroy Cha, MD.  The consultation is for the evaluation of PD. This patient is accompanied in the office by his sibling and daughter who supplements the history.  I have reviewed numerous records made available to me from Dr. Maxine Glenn through care everywhere.  The patient was diagnosed with Parkinson's disease in approximately 2008, with symptoms dating back to 2006 or 2007.  He recalls that his first symptoms were gait changes and an overall sense that he was slowing down and his voice was becoming softer.  The first medication that he was placed on was Requip and that was started by Dr. Brett Fairy.  He believes that medication was helpful when he was started on it.  He transferred care to Dr. Maxine Glenn in April, 2012.  He was started on levodopa in 2013.  Requip was d/c in august 2014 due to paranoia.  His wife was in the hospital at the time and it seemed to exacerbate the confusion.  He began to develop some memory changes in November, 2015 and was started on donepezil.    He was last seen by Dr. Maxine Glenn in October, 2016, at which point his carbidopa/levodopa 25/100 was increased to 2 tablets 3 times per day.  He takes it at 10am/2pm/6pm but he gets up at 6-7am.  He does this because when his wife was living she was a night owl and he would wait until she would wake up to take med.  He takes med 30 min before the meals.    03/21/16 update: Pt f/u today, accompanied by his daughter who supplements the hx.  On carbidopa/levodopa 25/100, 2 po tid but asked him to move timing of medication to 8am/12am/4pm.  Wrote RX for home PT.  States that it seemed to help some.  Family states that his BP was being monitored and it was low and so he was sent to the cardiologist and his BP medication was d/c.  No falls since our last visit.  No hallucinations.  No lightheadedness or  near syncope.  Gave him atropine gtts last visit as well for nose running and states that he isn't sure it helped.  He wasn't that consistent with it either.  Only exercise he gets is walking in the house.  Still on aricept.  Doing well with swallowing.    08/16/16 update:  Pt f/u today, accompanied by his daughter who supplements the history.  On carbidopa/levodopa 25/100, 2 tablets three times a day.  He fell one time.  He went to sit down on the rocking chair and it rocked and he fell.  He was able to get up without trouble.    No lightheadedness or near syncope.  Is on donepezil for memory change.  He is doing tango for PD.  He has only had 2 lessons.  12/26/16 update:  Patient seen in follow-up today for his Parkinson's disease, accompanied by his daughter who supplements the history.  The patient remains on carbidopa/levodopa 25/100, 2 tablets 3 times per day.  He has been doing about the same since our last visit.  Pt denies falls.  Pt denies lightheadedness, near syncope.  No hallucinations.  Mood has been good.  He remains on donepezil for memory change.  In regards to vasomotor rhinorrhea and the use of atropine drops, he states that he isn't using those much.  He didn't think that they were helping much.   He is having paresthesias in the bilateral LE, right more than left.  It seemed to start after the TAVR procedure in 2015.  05/03/17 update: Patient seen today in follow-up for Parkinson's disease.  He is accompanied by his daughter who supplements the history.  The patient is on carbidopa/levodopa 25/100, 2 tablets 3 times per day.  He had one fall.  He caught his foot on the floor and fell.  He was able to get himself up and "I had no bruises."  Daughter asks about PT but patient doesn't see that much benefit.  No lightheadedness or near syncope.  No hallucinations.  Last visit, he was complaining about paresthesias in the legs.  We added very low-dose gabapentin, 100 mg twice per day.  His  daughter emailed me about a month after we started it and said that she thought perhaps it was affecting his cognition slightly.  We ended up discontinuing it.  Today, they state that it was very subtle but the benefit wasn't worth the risk.  Incontinence has become a big issue for him.  Daughter asks if he can travel alone on an airplane.  She does not think he is safe, but his sister wanted him to travel to [place].  PREVIOUS MEDICATIONS: Sinemet and Requip  ALLERGIES:   Allergies  Allergen Reactions  . Penicillins     Unsure of reaction  . Vicodin [Hydrocodone-Acetaminophen] Other (See Comments)    "Mental reaction"    CURRENT MEDICATIONS:  Outpatient Encounter Medications as of 05/03/2017  Medication Sig  . acetaminophen (TYLENOL) 500 MG tablet Take 1,000 mg by mouth 2 (two) times daily as needed (pain). For pain  . aspirin EC 81 MG tablet Take by mouth.  . carbidopa-levodopa (SINEMET IR) 25-100 MG tablet TAKE 2 TABLETS BY MOUTH THREE TIMES A DAY  . cholecalciferol (VITAMIN D) 1000 UNITS tablet Take 1,000 Units by mouth at bedtime.   . clonazePAM (KLONOPIN) 1 MG tablet Take 1 mg by mouth at bedtime.   . donepezil (ARICEPT) 10 MG tablet TAKE ONE TABLET BY MOUTH EVERY NIGHT AT BEDTIME  . IBUPROFEN PO Take by mouth as needed.  Marland Kitchen omeprazole (PRILOSEC) 20 MG capsule Take 20 mg by mouth at bedtime.   . sertraline (ZOLOFT) 50 MG tablet Take by mouth.  . [DISCONTINUED] gabapentin (NEURONTIN) 100 MG capsule Take 1 capsule (100 mg total) by mouth 2 (two) times daily.   No facility-administered encounter medications on file as of 05/03/2017.     PAST MEDICAL HISTORY:   Past Medical History:  Diagnosis Date  . Anxiety   . Aortic stenosis   . Arthritis    left hip  . Cancer ([place])    basal cell- facial?  . Depression   . GERD (gastroesophageal reflux disease)   . Headache(784.0)   . Heart murmur   . Hernia, inguinal, bilateral, recurrent   . Kidney stones   . Parkinson disease  ([place])   . Parkinson's disease (tremor, stiffness, slow motion, unstable posture) ([place]) 12/11/2006  . PONV (postoperative nausea and vomiting)   . Restless leg syndrome   . S/P TAVR (transcatheter aortic valve replacement) 11/18/2013   26 mm Edwards Sapien XT transcatheter heart valve placed via open right transfemoral approach  . TIA (transient ischemic attack) 02/08/2007    PAST SURGICAL HISTORY:   Past Surgical History:  Procedure Laterality Date  . CARDIAC CATHETERIZATION  10/09/13  . cataract  2008  bilateral  . COLONOSCOPY    . CYSTOSCOPY/RETROGRADE/URETEROSCOPY  05/29/2011   Procedure: CYSTOSCOPY/RETROGRADE/URETEROSCOPY;  Surgeon: Malka So, MD;  Location: [facility];  Service: Urology;  Laterality: Left;  no stent   . EYE SURGERY Bilateral    /w IOL  . INGUINAL HERNIA REPAIR Bilateral 03/30/2014   Procedure: OPEN BILATERAL INGUINAL HERNIA REPAIR WITH MESH;  Surgeon: Coralie Keens, MD;  Location: [place];  Service: General;  Laterality: Bilateral;  . INSERTION OF MESH Bilateral 03/30/2014   Procedure: INSERTION OF MESH;  Surgeon: Coralie Keens, MD;  Location: [place];  Service: General;  Laterality: Bilateral;  . INTRAOPERATIVE TRANSESOPHAGEAL ECHOCARDIOGRAM N/A 11/18/2013   Procedure: INTRAOPERATIVE TRANSESOPHAGEAL ECHOCARDIOGRAM;  Surgeon: Sherren Mocha, MD;  Location: [facility] OR;  Service: Open Heart Surgery;  Laterality: N/A;  . LEFT AND RIGHT HEART CATHETERIZATION WITH CORONARY ANGIOGRAM N/A 10/09/2013   Procedure: LEFT AND RIGHT HEART CATHETERIZATION WITH CORONARY ANGIOGRAM;  Surgeon: Blane Ohara, MD;  Location[facility] C[facility] LAB;  Service: Cardiovascular;  Laterality: N/A;  . TRANSCATHETER AORTIC VALVE REPLACEMENT, TRANSFEMORAL N/A 11/18/2013   Procedure: TRANSCATHETER AORTIC VALVE REPLACEMENT, TRANSFEMORAL;  Surgeon: Sherren Mocha, MD;  Location: [place];  Service: Open Heart Surgery;  Laterality: N/A;    SOCIAL HISTORY:   Social History   Socioeconomic History  . Marital status:  Widowed    Spouse name: Not on file  . Number of children: Not on file  . Years of education: Not on file  . Highest education level: Not on file  Social Needs  . Financial resource strain: Not on file  . Food insecurity - worry: Not on file  . Food insecurity - inability: Not on file  . Transportation needs - medical: Not on file  . Transportation needs - non-medical: Not on file  Occupational History  . Not on file  Tobacco Use  . Smoking status: Never Smoker  . Smokeless tobacco: Never Used  Substance and Sexual Activity  . Alcohol use: No  . Drug use: No  . Sexual activity: Not on file  Other Topics Concern  . Not on file  Social History Narrative  . Not on file    FAMILY HISTORY:   Family Status  Relation Name Status  . Mother  Deceased at age 31  . Father  Deceased at age 68  . Daughter  (Not Specified)  . Daughter  (Not Specified)  . Daughter  (Not Specified)  . Daughter  (Not Specified)  . Daughter  (Not Specified)  . Son  (Not Specified)  . Sister  Alive  . Sister  Alive  . Neg Hx  (Not Specified)    ROS:  A complete 10 system review of systems was obtained and was unremarkable apart from what is mentioned above.  PHYSICAL EXAMINATION:    VITALS:   Vitals:   05/03/17 1454  BP: 110/70  Pulse: (!) 56  SpO2: 92%  Weight: 132 lb (59.9 kg)  Height: _0  (1.651 m)    GEN:  The patient appears stated age and is in NAD. HEENT:  Normocephalic, atraumatic.  The mucous membranes are moist. The superficial temporal arteries are without ropiness or tenderness. CV:  Bradycardic.  Regular rhythm.   Lungs:  CTAB Neck/HEME:  There are no carotid bruits bilaterally.  Neurological examination:  Orientation:  Montreal Cognitive Assessment  12/26/2016 12/16/2015  Visuospatial/ Executive (0/5) 2 1  Naming (0/3) 3 3  Attention: Read list of digits (0/2) 1 2  Attention: Read list of  letters (0/1) 1 1  Attention: Serial 7 subtraction starting at 100 (0/3) 1 2    Language: Repeat phrase (0/2) 2 2  Language : Fluency (0/1) 0 0  Abstraction (0/2) 2 2  Delayed Recall (0/5) 4 3  Orientation (0/6) 5 5  Total 21 21  Adjusted Score (based on education) 21 21    Cranial nerves: There is good facial symmetry.   There is facial hypomimia.  Speech is fluent and clear, but it is very hypophonic.  Soft palate rises symmetrically and there is no tongue deviation. Hearing is intact to conversational tone. Sensation: Sensation is intact to light touch throughout.  Vibration is decreased distally.   Motor: Strength is at least antigravity x4.  Movement examination: Tone: There is normal tone throughout Abnormal movements: no tremor noted.  There is mild dyskinesia in the right lower extremity. Coordination:  There is decremation with RAM's, mostly with alternation of supination of the forearm bilaterally. Gait and Station: The patient has no difficulty arising out of a deep-seated chair without the use of the hands. The patient's stride length is decreased with stooped posture and decreased arm swing on the L.  He is using his cane.    Chemistry      Component Value Date/Time   NA 141 03/23/2014 1457   K 4.4 03/23/2014 1457   CL 102 03/23/2014 1457   CO2 28 03/23/2014 1457   BUN 18 03/23/2014 1457   CREATININE 0.80 03/23/2014 1457      Component Value Date/Time   CALCIUM 9.5 03/23/2014 1457   ALKPHOS 99 11/17/2013 1005   AST 20 11/17/2013 1005   ALT 14 11/17/2013 1005   BILITOT 0.4 11/17/2013 1005       ASSESSMENT/PLAN:  1.  Idiopathic Parkinson's disease.  The patient has tremor, bradykinesia, rigidity and postural instability.  Dx was in 2008 by Dr. Love/Dohmeier but followed by Dr. Maxine Glenn for past several years.  -We discussed the diagnosis as well as pathophysiology of the disease.  We discussed treatment options as well as prognostic indicators.  Patient education was provided.  -The patient will continue carbidopa/levodopa 25/100, 2 tablets  3 times per day.  -Readdressed living situation.  Family checks on him multiple times per day.  His 69 year old nephew lives with him, but that is more for convenience for the nephew than helpful for the patient.  -order home PT  -Labs today.  -Met with my social worker to offer services.  2.  Vasomotor rhinorrhea  -didn't find the atropine gtts all that helpful and he d/c  3.  Parkinsons dementia  -safety in home discussed  -on donepezil, 10 mg daily.  Will continue  -asked about prevagen.  I don't recommend this as no good scientific data on this.    4.  Paresthesias  -likely due to peripheral neuropathy.    -Tried very low-dose gabapentin, but seemed to affect cognition and we d/c it.  Family asks me if there is anything further that we can do for this.  I explained that I think the risks of further medication outweigh the benefits.  Patient and family were fine with this.   5. urinary incontinence  -will refer to [place] [facility]  -will do UA  -myretriq would be best option if meds are necessary  6.  I will plan on seeing the patient back in the next few months, sooner should new neurologic issues arise.  Much greater than 50% of this visit was spent in counseling  and coordinating care.  Total face to face time:  30 min

## 2017-05-03 ENCOUNTER — Encounter: Payer: Self-pay | Admitting: Neurology

## 2017-05-03 ENCOUNTER — Other Ambulatory Visit: Payer: Medicare Other

## 2017-05-03 ENCOUNTER — Ambulatory Visit (INDEPENDENT_AMBULATORY_CARE_PROVIDER_SITE_OTHER): Payer: Medicare Other | Admitting: Neurology

## 2017-05-03 ENCOUNTER — Encounter: Payer: Self-pay | Admitting: Psychology

## 2017-05-03 VITALS — BP 110/70 | HR 56 | Ht 65.0 in | Wt 132.0 lb

## 2017-05-03 DIAGNOSIS — R32 Unspecified urinary incontinence: Secondary | ICD-10-CM | POA: Diagnosis not present

## 2017-05-03 DIAGNOSIS — F0280 Dementia in other diseases classified elsewhere without behavioral disturbance: Secondary | ICD-10-CM

## 2017-05-03 DIAGNOSIS — G20 Parkinson's disease: Secondary | ICD-10-CM

## 2017-05-03 LAB — COMPREHENSIVE METABOLIC PANEL
AG RATIO: 2 (calc) (ref 1.0–2.5)
ALBUMIN MSPROF: 4.4 g/dL (ref 3.6–5.1)
ALKALINE PHOSPHATASE (APISO): 105 U/L (ref 40–115)
ALT: 4 U/L — ABNORMAL LOW (ref 9–46)
AST: 15 U/L (ref 10–35)
BILIRUBIN TOTAL: 0.6 mg/dL (ref 0.2–1.2)
BUN: 18 mg/dL (ref 7–25)
CALCIUM: 9.5 mg/dL (ref 8.6–10.3)
CHLORIDE: 105 mmol/L (ref 98–110)
CO2: 30 mmol/L (ref 20–32)
Creat: 1.13 mg/dL (ref 0.70–1.18)
Globulin: 2.2 g/dL (calc) (ref 1.9–3.7)
Glucose, Bld: 99 mg/dL (ref 65–99)
POTASSIUM: 5 mmol/L (ref 3.5–5.3)
SODIUM: 141 mmol/L (ref 135–146)
Total Protein: 6.6 g/dL (ref 6.1–8.1)

## 2017-05-03 LAB — CBC WITH DIFFERENTIAL/PLATELET
Basophils Absolute: 19 {cells}/uL (ref 0–200)
Basophils Relative: 0.4 %
Eosinophils Absolute: 118 {cells}/uL (ref 15–500)
Eosinophils Relative: 2.5 %
HCT: 39.2 % (ref 38.5–50.0)
Hemoglobin: 12.9 g/dL — ABNORMAL LOW (ref 13.2–17.1)
Lymphs Abs: 1067 {cells}/uL (ref 850–3900)
MCH: 28.5 pg (ref 27.0–33.0)
MCHC: 32.9 g/dL (ref 32.0–36.0)
MCV: 86.5 fL (ref 80.0–100.0)
MPV: 12.1 fL (ref 7.5–12.5)
Monocytes Relative: 7 %
Neutro Abs: 3168 {cells}/uL (ref 1500–7800)
Neutrophils Relative %: 67.4 %
Platelets: 113 Thousand/uL — ABNORMAL LOW (ref 140–400)
RBC: 4.53 Million/uL (ref 4.20–5.80)
RDW: 12.7 % (ref 11.0–15.0)
Total Lymphocyte: 22.7 %
WBC mixed population: 329 {cells}/uL (ref 200–950)
WBC: 4.7 Thousand/uL (ref 3.8–10.8)

## 2017-05-03 NOTE — Progress Notes (Signed)
Met with patient and his daughter whether in the clinic today.  We talked a little bit about the support group he attends at [employer] in [place].  He expresses some interest in the rock steady boxing at [facility] so I provided him some information.   In addition, we talked about transportation being a need.  I gave him resources and information on the Senior [employer] program as it serves [place] not just [place].  In addition, I provided them with another resource for transportation and [facility].  The patient's daughter expressed that most sometimes they are able to work out transportation but that is the most difficult thing to coordinate at times.

## 2017-05-03 NOTE — Patient Instructions (Addendum)
1. Your provider has requested that you have labwork completed today. Please go to [facility] ([id number]) on the second floor of this building before leaving the office today. You do not need to check in. If you are not called within 15 minutes please check with the front desk.   2. Referral sent to [facility] for physical therapy.  They will call you to schedule.   3. Referral sent to [facility]. They are located at [address], [place] [address]. We have you schedule for 05/17/2017 at 3:30 pm with Dr. Pilar Jarvis. If this is not a good date/time please call [phone] to reschedule.

## 2017-05-04 ENCOUNTER — Telehealth: Payer: Self-pay | Admitting: Neurology

## 2017-05-04 LAB — URINALYSIS
Bilirubin Urine: NEGATIVE
Glucose, UA: NEGATIVE
Hgb urine dipstick: NEGATIVE
Leukocytes, UA: NEGATIVE
Nitrite: NEGATIVE
Protein, ur: NEGATIVE
Specific Gravity, Urine: 1.036 — ABNORMAL HIGH (ref 1.001–1.03)
pH: <=5 (ref 5.0–8.0)

## 2017-05-04 LAB — URINE CULTURE
MICRO NUMBER:: 81433911
Result:: NO GROWTH
SPECIMEN QUALITY:: ADEQUATE

## 2017-05-04 NOTE — Telephone Encounter (Signed)
-----   Message from [place], DO sent at 05/04/2017  7:56 AM EST ----- Let pt/daughter know that labs okay.  Urine looks like he could be just a tiny dehydrated but not bad

## 2017-05-04 NOTE — Telephone Encounter (Signed)
Patient's daughter made aware of results.  

## 2017-05-10 ENCOUNTER — Telehealth: Payer: Self-pay | Admitting: Neurology

## 2017-05-10 NOTE — Telephone Encounter (Signed)
Beth called and said that his Family had requested that they hold off until after the first of the year. They will start on 05/17/17. Thanks

## 2017-05-11 NOTE — Telephone Encounter (Signed)
Noted  

## 2017-05-14 ENCOUNTER — Encounter: Payer: Self-pay | Admitting: Neurology

## 2017-05-16 NOTE — Telephone Encounter (Signed)
Notes faxed to [facility] for upcoming appt to [phone] with confirmation received.

## 2017-05-17 ENCOUNTER — Telehealth: Payer: Self-pay | Admitting: Neurology

## 2017-05-17 ENCOUNTER — Ambulatory Visit: Payer: Self-pay

## 2017-05-17 NOTE — Telephone Encounter (Signed)
Gave okay for start of care next week.

## 2017-05-17 NOTE — Telephone Encounter (Signed)
Lee with [facility] called and needs verbal orders for pt CB# [phone]

## 2017-05-21 DIAGNOSIS — G20 Parkinson's disease: Secondary | ICD-10-CM | POA: Diagnosis not present

## 2017-05-21 DIAGNOSIS — Z952 Presence of prosthetic heart valve: Secondary | ICD-10-CM | POA: Diagnosis not present

## 2017-05-21 DIAGNOSIS — F0280 Dementia in other diseases classified elsewhere without behavioral disturbance: Secondary | ICD-10-CM | POA: Diagnosis not present

## 2017-05-21 DIAGNOSIS — Z9181 History of falling: Secondary | ICD-10-CM | POA: Diagnosis not present

## 2017-05-21 DIAGNOSIS — M1612 Unilateral primary osteoarthritis, left hip: Secondary | ICD-10-CM | POA: Diagnosis not present

## 2017-05-21 DIAGNOSIS — Z7982 Long term (current) use of aspirin: Secondary | ICD-10-CM | POA: Diagnosis not present

## 2017-05-21 DIAGNOSIS — R202 Paresthesia of skin: Secondary | ICD-10-CM | POA: Diagnosis not present

## 2017-05-23 ENCOUNTER — Telehealth: Payer: Self-pay | Admitting: Neurology

## 2017-05-23 NOTE — Telephone Encounter (Signed)
Needing Verbal Orders for PT. Please Call. Thanks

## 2017-05-23 NOTE — Telephone Encounter (Signed)
Verbal orders given to [facility] for PT.

## 2017-05-25 DIAGNOSIS — Z7982 Long term (current) use of aspirin: Secondary | ICD-10-CM | POA: Diagnosis not present

## 2017-05-25 DIAGNOSIS — M1612 Unilateral primary osteoarthritis, left hip: Secondary | ICD-10-CM | POA: Diagnosis not present

## 2017-05-25 DIAGNOSIS — F0280 Dementia in other diseases classified elsewhere without behavioral disturbance: Secondary | ICD-10-CM | POA: Diagnosis not present

## 2017-05-25 DIAGNOSIS — Z952 Presence of prosthetic heart valve: Secondary | ICD-10-CM | POA: Diagnosis not present

## 2017-05-25 DIAGNOSIS — G20 Parkinson's disease: Secondary | ICD-10-CM | POA: Diagnosis not present

## 2017-05-25 DIAGNOSIS — R202 Paresthesia of skin: Secondary | ICD-10-CM | POA: Diagnosis not present

## 2017-05-28 DIAGNOSIS — Z7982 Long term (current) use of aspirin: Secondary | ICD-10-CM | POA: Diagnosis not present

## 2017-05-28 DIAGNOSIS — Z952 Presence of prosthetic heart valve: Secondary | ICD-10-CM | POA: Diagnosis not present

## 2017-05-28 DIAGNOSIS — R202 Paresthesia of skin: Secondary | ICD-10-CM | POA: Diagnosis not present

## 2017-05-28 DIAGNOSIS — F0280 Dementia in other diseases classified elsewhere without behavioral disturbance: Secondary | ICD-10-CM | POA: Diagnosis not present

## 2017-05-28 DIAGNOSIS — M1612 Unilateral primary osteoarthritis, left hip: Secondary | ICD-10-CM | POA: Diagnosis not present

## 2017-05-28 DIAGNOSIS — G20 Parkinson's disease: Secondary | ICD-10-CM | POA: Diagnosis not present

## 2017-05-31 DIAGNOSIS — Z952 Presence of prosthetic heart valve: Secondary | ICD-10-CM | POA: Diagnosis not present

## 2017-05-31 DIAGNOSIS — F0280 Dementia in other diseases classified elsewhere without behavioral disturbance: Secondary | ICD-10-CM | POA: Diagnosis not present

## 2017-05-31 DIAGNOSIS — G20 Parkinson's disease: Secondary | ICD-10-CM | POA: Diagnosis not present

## 2017-05-31 DIAGNOSIS — M1612 Unilateral primary osteoarthritis, left hip: Secondary | ICD-10-CM | POA: Diagnosis not present

## 2017-05-31 DIAGNOSIS — Z7982 Long term (current) use of aspirin: Secondary | ICD-10-CM | POA: Diagnosis not present

## 2017-05-31 DIAGNOSIS — R202 Paresthesia of skin: Secondary | ICD-10-CM | POA: Diagnosis not present

## 2017-06-04 DIAGNOSIS — Z952 Presence of prosthetic heart valve: Secondary | ICD-10-CM | POA: Diagnosis not present

## 2017-06-04 DIAGNOSIS — R202 Paresthesia of skin: Secondary | ICD-10-CM | POA: Diagnosis not present

## 2017-06-04 DIAGNOSIS — M1612 Unilateral primary osteoarthritis, left hip: Secondary | ICD-10-CM | POA: Diagnosis not present

## 2017-06-04 DIAGNOSIS — Z7982 Long term (current) use of aspirin: Secondary | ICD-10-CM | POA: Diagnosis not present

## 2017-06-04 DIAGNOSIS — F0280 Dementia in other diseases classified elsewhere without behavioral disturbance: Secondary | ICD-10-CM | POA: Diagnosis not present

## 2017-06-04 DIAGNOSIS — G20 Parkinson's disease: Secondary | ICD-10-CM | POA: Diagnosis not present

## 2017-06-05 DIAGNOSIS — Z125 Encounter for screening for malignant neoplasm of prostate: Secondary | ICD-10-CM | POA: Diagnosis not present

## 2017-06-05 DIAGNOSIS — N3941 Urge incontinence: Secondary | ICD-10-CM | POA: Diagnosis not present

## 2017-06-07 DIAGNOSIS — G20 Parkinson's disease: Secondary | ICD-10-CM | POA: Diagnosis not present

## 2017-06-07 DIAGNOSIS — M1612 Unilateral primary osteoarthritis, left hip: Secondary | ICD-10-CM | POA: Diagnosis not present

## 2017-06-07 DIAGNOSIS — Z952 Presence of prosthetic heart valve: Secondary | ICD-10-CM | POA: Diagnosis not present

## 2017-06-07 DIAGNOSIS — Z7982 Long term (current) use of aspirin: Secondary | ICD-10-CM | POA: Diagnosis not present

## 2017-06-07 DIAGNOSIS — F0280 Dementia in other diseases classified elsewhere without behavioral disturbance: Secondary | ICD-10-CM | POA: Diagnosis not present

## 2017-06-07 DIAGNOSIS — R202 Paresthesia of skin: Secondary | ICD-10-CM | POA: Diagnosis not present

## 2017-06-11 DIAGNOSIS — M1612 Unilateral primary osteoarthritis, left hip: Secondary | ICD-10-CM | POA: Diagnosis not present

## 2017-06-11 DIAGNOSIS — G20 Parkinson's disease: Secondary | ICD-10-CM | POA: Diagnosis not present

## 2017-06-11 DIAGNOSIS — Z7982 Long term (current) use of aspirin: Secondary | ICD-10-CM | POA: Diagnosis not present

## 2017-06-11 DIAGNOSIS — R202 Paresthesia of skin: Secondary | ICD-10-CM | POA: Diagnosis not present

## 2017-06-11 DIAGNOSIS — Z952 Presence of prosthetic heart valve: Secondary | ICD-10-CM | POA: Diagnosis not present

## 2017-06-11 DIAGNOSIS — F0280 Dementia in other diseases classified elsewhere without behavioral disturbance: Secondary | ICD-10-CM | POA: Diagnosis not present

## 2017-06-14 ENCOUNTER — Telehealth: Payer: Self-pay | Admitting: Neurology

## 2017-06-14 DIAGNOSIS — R202 Paresthesia of skin: Secondary | ICD-10-CM | POA: Diagnosis not present

## 2017-06-14 DIAGNOSIS — Z952 Presence of prosthetic heart valve: Secondary | ICD-10-CM | POA: Diagnosis not present

## 2017-06-14 DIAGNOSIS — F0280 Dementia in other diseases classified elsewhere without behavioral disturbance: Secondary | ICD-10-CM | POA: Diagnosis not present

## 2017-06-14 DIAGNOSIS — M1612 Unilateral primary osteoarthritis, left hip: Secondary | ICD-10-CM | POA: Diagnosis not present

## 2017-06-14 DIAGNOSIS — G20 Parkinson's disease: Secondary | ICD-10-CM | POA: Diagnosis not present

## 2017-06-14 DIAGNOSIS — Z7982 Long term (current) use of aspirin: Secondary | ICD-10-CM | POA: Diagnosis not present

## 2017-06-14 NOTE — Telephone Encounter (Signed)
Sharyn Lull called from [facility] and lmom needing a Verbal order to move Evaluation to next week. Please call. Thanks

## 2017-06-14 NOTE — Telephone Encounter (Signed)
Verbal order given to [facility] with [facility].

## 2017-06-18 DIAGNOSIS — R202 Paresthesia of skin: Secondary | ICD-10-CM | POA: Diagnosis not present

## 2017-06-18 DIAGNOSIS — Z7982 Long term (current) use of aspirin: Secondary | ICD-10-CM | POA: Diagnosis not present

## 2017-06-18 DIAGNOSIS — F0280 Dementia in other diseases classified elsewhere without behavioral disturbance: Secondary | ICD-10-CM | POA: Diagnosis not present

## 2017-06-18 DIAGNOSIS — Z952 Presence of prosthetic heart valve: Secondary | ICD-10-CM | POA: Diagnosis not present

## 2017-06-18 DIAGNOSIS — G20 Parkinson's disease: Secondary | ICD-10-CM | POA: Diagnosis not present

## 2017-06-18 DIAGNOSIS — M1612 Unilateral primary osteoarthritis, left hip: Secondary | ICD-10-CM | POA: Diagnosis not present

## 2017-06-21 ENCOUNTER — Telehealth: Payer: Self-pay | Admitting: Neurology

## 2017-06-21 DIAGNOSIS — M1612 Unilateral primary osteoarthritis, left hip: Secondary | ICD-10-CM | POA: Diagnosis not present

## 2017-06-21 DIAGNOSIS — Z7982 Long term (current) use of aspirin: Secondary | ICD-10-CM | POA: Diagnosis not present

## 2017-06-21 DIAGNOSIS — G20 Parkinson's disease: Secondary | ICD-10-CM | POA: Diagnosis not present

## 2017-06-21 DIAGNOSIS — Z952 Presence of prosthetic heart valve: Secondary | ICD-10-CM | POA: Diagnosis not present

## 2017-06-21 DIAGNOSIS — R202 Paresthesia of skin: Secondary | ICD-10-CM | POA: Diagnosis not present

## 2017-06-21 DIAGNOSIS — F0280 Dementia in other diseases classified elsewhere without behavioral disturbance: Secondary | ICD-10-CM | POA: Diagnosis not present

## 2017-06-21 NOTE — Telephone Encounter (Signed)
[name] called and needs to speak with you regarding his blood pressure being 90/54. She would like further instruction. Please Call. Thanks

## 2017-06-21 NOTE — Telephone Encounter (Signed)
Sharyn Lull made aware that this is patient's normal blood pressure reading.

## 2017-06-22 DIAGNOSIS — Z7982 Long term (current) use of aspirin: Secondary | ICD-10-CM | POA: Diagnosis not present

## 2017-06-22 DIAGNOSIS — Z952 Presence of prosthetic heart valve: Secondary | ICD-10-CM | POA: Diagnosis not present

## 2017-06-22 DIAGNOSIS — G20 Parkinson's disease: Secondary | ICD-10-CM | POA: Diagnosis not present

## 2017-06-22 DIAGNOSIS — R202 Paresthesia of skin: Secondary | ICD-10-CM | POA: Diagnosis not present

## 2017-06-22 DIAGNOSIS — F0280 Dementia in other diseases classified elsewhere without behavioral disturbance: Secondary | ICD-10-CM | POA: Diagnosis not present

## 2017-06-22 DIAGNOSIS — M1612 Unilateral primary osteoarthritis, left hip: Secondary | ICD-10-CM | POA: Diagnosis not present

## 2017-06-25 DIAGNOSIS — F0280 Dementia in other diseases classified elsewhere without behavioral disturbance: Secondary | ICD-10-CM | POA: Diagnosis not present

## 2017-06-25 DIAGNOSIS — Z952 Presence of prosthetic heart valve: Secondary | ICD-10-CM | POA: Diagnosis not present

## 2017-06-25 DIAGNOSIS — R202 Paresthesia of skin: Secondary | ICD-10-CM | POA: Diagnosis not present

## 2017-06-25 DIAGNOSIS — G20 Parkinson's disease: Secondary | ICD-10-CM | POA: Diagnosis not present

## 2017-06-25 DIAGNOSIS — Z7982 Long term (current) use of aspirin: Secondary | ICD-10-CM | POA: Diagnosis not present

## 2017-06-25 DIAGNOSIS — M1612 Unilateral primary osteoarthritis, left hip: Secondary | ICD-10-CM | POA: Diagnosis not present

## 2017-06-26 ENCOUNTER — Encounter: Payer: Self-pay | Admitting: Neurology

## 2017-06-26 MED ORDER — CARBIDOPA-LEVODOPA 25-100 MG PO TABS: 2.0000 | ORAL_TABLET | Freq: Three times a day (TID) | ORAL | 1 refills | Status: DC

## 2017-06-26 MED ORDER — DONEPEZIL HCL 10 MG PO TABS: 10.0000 mg | ORAL_TABLET | Freq: Every day | ORAL | 1 refills | Status: DC

## 2017-06-28 DIAGNOSIS — G20 Parkinson's disease: Secondary | ICD-10-CM | POA: Diagnosis not present

## 2017-06-28 DIAGNOSIS — F0280 Dementia in other diseases classified elsewhere without behavioral disturbance: Secondary | ICD-10-CM | POA: Diagnosis not present

## 2017-06-28 DIAGNOSIS — Z7982 Long term (current) use of aspirin: Secondary | ICD-10-CM | POA: Diagnosis not present

## 2017-06-28 DIAGNOSIS — R202 Paresthesia of skin: Secondary | ICD-10-CM | POA: Diagnosis not present

## 2017-06-28 DIAGNOSIS — M1612 Unilateral primary osteoarthritis, left hip: Secondary | ICD-10-CM | POA: Diagnosis not present

## 2017-06-28 DIAGNOSIS — Z952 Presence of prosthetic heart valve: Secondary | ICD-10-CM | POA: Diagnosis not present

## 2017-06-29 DIAGNOSIS — R202 Paresthesia of skin: Secondary | ICD-10-CM | POA: Diagnosis not present

## 2017-06-29 DIAGNOSIS — F0280 Dementia in other diseases classified elsewhere without behavioral disturbance: Secondary | ICD-10-CM | POA: Diagnosis not present

## 2017-06-29 DIAGNOSIS — Z952 Presence of prosthetic heart valve: Secondary | ICD-10-CM | POA: Diagnosis not present

## 2017-06-29 DIAGNOSIS — Z7982 Long term (current) use of aspirin: Secondary | ICD-10-CM | POA: Diagnosis not present

## 2017-06-29 DIAGNOSIS — M1612 Unilateral primary osteoarthritis, left hip: Secondary | ICD-10-CM | POA: Diagnosis not present

## 2017-06-29 DIAGNOSIS — G20 Parkinson's disease: Secondary | ICD-10-CM | POA: Diagnosis not present

## 2017-07-02 DIAGNOSIS — R202 Paresthesia of skin: Secondary | ICD-10-CM | POA: Diagnosis not present

## 2017-07-02 DIAGNOSIS — Z952 Presence of prosthetic heart valve: Secondary | ICD-10-CM | POA: Diagnosis not present

## 2017-07-02 DIAGNOSIS — M1612 Unilateral primary osteoarthritis, left hip: Secondary | ICD-10-CM | POA: Diagnosis not present

## 2017-07-02 DIAGNOSIS — G20 Parkinson's disease: Secondary | ICD-10-CM | POA: Diagnosis not present

## 2017-07-02 DIAGNOSIS — Z7982 Long term (current) use of aspirin: Secondary | ICD-10-CM | POA: Diagnosis not present

## 2017-07-02 DIAGNOSIS — F0280 Dementia in other diseases classified elsewhere without behavioral disturbance: Secondary | ICD-10-CM | POA: Diagnosis not present

## 2017-07-04 DIAGNOSIS — R202 Paresthesia of skin: Secondary | ICD-10-CM | POA: Diagnosis not present

## 2017-07-04 DIAGNOSIS — Z952 Presence of prosthetic heart valve: Secondary | ICD-10-CM | POA: Diagnosis not present

## 2017-07-04 DIAGNOSIS — G20 Parkinson's disease: Secondary | ICD-10-CM | POA: Diagnosis not present

## 2017-07-04 DIAGNOSIS — M1612 Unilateral primary osteoarthritis, left hip: Secondary | ICD-10-CM | POA: Diagnosis not present

## 2017-07-04 DIAGNOSIS — F0280 Dementia in other diseases classified elsewhere without behavioral disturbance: Secondary | ICD-10-CM | POA: Diagnosis not present

## 2017-07-04 DIAGNOSIS — Z7982 Long term (current) use of aspirin: Secondary | ICD-10-CM | POA: Diagnosis not present

## 2017-07-05 DIAGNOSIS — M1612 Unilateral primary osteoarthritis, left hip: Secondary | ICD-10-CM | POA: Diagnosis not present

## 2017-07-05 DIAGNOSIS — Z952 Presence of prosthetic heart valve: Secondary | ICD-10-CM | POA: Diagnosis not present

## 2017-07-05 DIAGNOSIS — G20 Parkinson's disease: Secondary | ICD-10-CM | POA: Diagnosis not present

## 2017-07-05 DIAGNOSIS — Z7982 Long term (current) use of aspirin: Secondary | ICD-10-CM | POA: Diagnosis not present

## 2017-07-05 DIAGNOSIS — F0280 Dementia in other diseases classified elsewhere without behavioral disturbance: Secondary | ICD-10-CM | POA: Diagnosis not present

## 2017-07-05 DIAGNOSIS — R202 Paresthesia of skin: Secondary | ICD-10-CM | POA: Diagnosis not present

## 2017-07-09 DIAGNOSIS — F0280 Dementia in other diseases classified elsewhere without behavioral disturbance: Secondary | ICD-10-CM | POA: Diagnosis not present

## 2017-07-09 DIAGNOSIS — G20 Parkinson's disease: Secondary | ICD-10-CM | POA: Diagnosis not present

## 2017-07-09 DIAGNOSIS — R202 Paresthesia of skin: Secondary | ICD-10-CM | POA: Diagnosis not present

## 2017-07-09 DIAGNOSIS — M1612 Unilateral primary osteoarthritis, left hip: Secondary | ICD-10-CM | POA: Diagnosis not present

## 2017-07-09 DIAGNOSIS — Z7982 Long term (current) use of aspirin: Secondary | ICD-10-CM | POA: Diagnosis not present

## 2017-07-09 DIAGNOSIS — Z952 Presence of prosthetic heart valve: Secondary | ICD-10-CM | POA: Diagnosis not present

## 2017-07-12 DIAGNOSIS — Z7982 Long term (current) use of aspirin: Secondary | ICD-10-CM | POA: Diagnosis not present

## 2017-07-12 DIAGNOSIS — Z952 Presence of prosthetic heart valve: Secondary | ICD-10-CM | POA: Diagnosis not present

## 2017-07-12 DIAGNOSIS — F0280 Dementia in other diseases classified elsewhere without behavioral disturbance: Secondary | ICD-10-CM | POA: Diagnosis not present

## 2017-07-12 DIAGNOSIS — G20 Parkinson's disease: Secondary | ICD-10-CM | POA: Diagnosis not present

## 2017-07-12 DIAGNOSIS — M1612 Unilateral primary osteoarthritis, left hip: Secondary | ICD-10-CM | POA: Diagnosis not present

## 2017-07-12 DIAGNOSIS — R202 Paresthesia of skin: Secondary | ICD-10-CM | POA: Diagnosis not present

## 2017-07-18 DIAGNOSIS — Z952 Presence of prosthetic heart valve: Secondary | ICD-10-CM | POA: Diagnosis not present

## 2017-07-18 DIAGNOSIS — F0280 Dementia in other diseases classified elsewhere without behavioral disturbance: Secondary | ICD-10-CM | POA: Diagnosis not present

## 2017-07-18 DIAGNOSIS — G20 Parkinson's disease: Secondary | ICD-10-CM | POA: Diagnosis not present

## 2017-07-18 DIAGNOSIS — M1612 Unilateral primary osteoarthritis, left hip: Secondary | ICD-10-CM | POA: Diagnosis not present

## 2017-07-18 DIAGNOSIS — R202 Paresthesia of skin: Secondary | ICD-10-CM | POA: Diagnosis not present

## 2017-07-18 DIAGNOSIS — Z7982 Long term (current) use of aspirin: Secondary | ICD-10-CM | POA: Diagnosis not present

## 2017-09-06 ENCOUNTER — Ambulatory Visit: Payer: Medicare Other | Admitting: Neurology
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
## 2017-09-13 ENCOUNTER — Ambulatory Visit (INDEPENDENT_AMBULATORY_CARE_PROVIDER_SITE_OTHER): Payer: Medicare Other | Admitting: Neurology

## 2017-09-13 ENCOUNTER — Other Ambulatory Visit: Payer: Self-pay

## 2017-09-13 ENCOUNTER — Encounter: Payer: Self-pay | Admitting: Neurology

## 2017-09-13 VITALS — BP 122/64 | HR 61 | Ht 65.0 in | Wt 132.0 lb

## 2017-09-13 DIAGNOSIS — G20 Parkinson's disease: Secondary | ICD-10-CM | POA: Diagnosis not present

## 2017-09-13 NOTE — Patient Instructions (Signed)
  [facility] [employer] & Movement Disorders  The [facility] Movement Disorders team know that living well with a movement disorder extends far beyond our clinic walls. We are together with you. Our team is passionate about providing resources to you and your loved ones who are living with Parkinson's disease and movement disorders. Participate in these programs and join our community. These resources are free or low cost!   [facility] Movement [facility] Program is adding:   Innovative educational programs for patients and caregivers.   Support groups for patients and caregivers living with Parkinson's disease.   Parkinson's specific exercise programs.   Custom tailored therapeutic programs that will benefit patient's living with Parkinson's disease.   We are in this together. You can help and contribute to grow these programs and resources in our community. 100% of the funds donated to the [employer] stays right here in our community to support patients and their caregivers.  To make a tax deductible contribution:  -ask for a [employer] for Parkinson's envelope in the office today.  - call the Office of Institutional Advancement at [phone].     Registration is OPEN!    Third Annual Parkinson's Education Symposium   To register: [email or web address]      Search:  [employer] person attending individually Questions: [place], [place] or Janett [email or web address]    [facility] for Adults with Parkinson's Thursdays 9:30-10:30 am May 2, May 9, & May 16 & Tuesdays 10:00-11:00 am June 4, June 18, & July 2 Elnora Morrison [employer], [address] [place]. To register: [phone] or email [email or web address]

## 2017-09-21 ENCOUNTER — Other Ambulatory Visit: Payer: Self-pay | Admitting: Neurology

## 2017-09-21 MED ORDER — CLONAZEPAM 0.5 MG PO TABS
0.5000 mg | ORAL_TABLET | Freq: Every day | ORAL | 1 refills | Status: DC
Start: 2017-09-21 — End: 2018-03-19

## 2017-09-21 NOTE — Telephone Encounter (Signed)
Lets try 1/2 that dose first if they are agreeable, given age and potential confusion and risk of falls with that

## 2017-09-21 NOTE — Telephone Encounter (Signed)
Spoke with daughter and they are okay with this dosage. Will you sign RX to send to [facility]?

## 2017-09-21 NOTE — Telephone Encounter (Signed)
Spoke with patient's daughter. She states PCP changed (lots of turn over) at [facility] and new PCP won't fill his clonazepam until he is seen. They offered appt for tomorrow, but they are unable to bring him on such short notice.   He takes Clonazepam 1 mg - 1 tablet at bedtime for REM behavior disorder. She wanted to see if Dr. Carles Collet would take over RX since related to his Parkinson's disease.   Dr. Carles Collet please advise.

## 2017-11-09 ENCOUNTER — Emergency Department (HOSPITAL_COMMUNITY): Payer: Medicare Other

## 2017-11-09 ENCOUNTER — Emergency Department (HOSPITAL_COMMUNITY)
Admission: EM | Admit: 2017-11-09 | Discharge: 2017-11-10 | Disposition: A | Discharge status: Home / Self Care | Payer: Medicare Other | Attending: Emergency Medicine | Admitting: Emergency Medicine

## 2017-11-09 ENCOUNTER — Other Ambulatory Visit: Payer: Self-pay

## 2017-11-09 ENCOUNTER — Encounter (HOSPITAL_COMMUNITY): Payer: Self-pay

## 2017-11-09 DIAGNOSIS — Z79899 Other long term (current) drug therapy: Secondary | ICD-10-CM | POA: Insufficient documentation

## 2017-11-09 DIAGNOSIS — R410 Disorientation, unspecified: Secondary | ICD-10-CM

## 2017-11-09 DIAGNOSIS — G3183 Dementia with Lewy bodies: Secondary | ICD-10-CM | POA: Diagnosis not present

## 2017-11-09 DIAGNOSIS — S1093XA Contusion of unspecified part of neck, initial encounter: Secondary | ICD-10-CM | POA: Diagnosis not present

## 2017-11-09 DIAGNOSIS — Z7982 Long term (current) use of aspirin: Secondary | ICD-10-CM | POA: Diagnosis not present

## 2017-11-09 DIAGNOSIS — Z8673 Personal history of transient ischemic attack (TIA), and cerebral infarction without residual deficits: Secondary | ICD-10-CM | POA: Insufficient documentation

## 2017-11-09 DIAGNOSIS — Z85828 Personal history of other malignant neoplasm of skin: Secondary | ICD-10-CM | POA: Diagnosis not present

## 2017-11-09 DIAGNOSIS — F0390 Unspecified dementia without behavioral disturbance: Secondary | ICD-10-CM | POA: Diagnosis not present

## 2017-11-09 DIAGNOSIS — R4182 Altered mental status, unspecified: Secondary | ICD-10-CM | POA: Diagnosis not present

## 2017-11-09 DIAGNOSIS — R531 Weakness: Secondary | ICD-10-CM | POA: Diagnosis not present

## 2017-11-09 DIAGNOSIS — R638 Other symptoms and signs concerning food and fluid intake: Secondary | ICD-10-CM | POA: Diagnosis not present

## 2017-11-09 LAB — CBC WITH DIFFERENTIAL/PLATELET
Basophils Absolute: 0 10*3/uL (ref 0.0–0.1)
Basophils Relative: 0 %
EOS ABS: 0.1 10*3/uL (ref 0.0–0.7)
Eosinophils Relative: 1 %
HCT: 34.9 % — ABNORMAL LOW (ref 39.0–52.0)
Hemoglobin: 11.5 g/dL — ABNORMAL LOW (ref 13.0–17.0)
LYMPHS ABS: 0.8 10*3/uL (ref 0.7–4.0)
LYMPHS PCT: 12 %
MCH: 29.6 pg (ref 26.0–34.0)
MCHC: 33 g/dL (ref 30.0–36.0)
MCV: 89.9 fL (ref 78.0–100.0)
Monocytes Absolute: 0.7 10*3/uL (ref 0.1–1.0)
Monocytes Relative: 10 %
Neutro Abs: 5.6 10*3/uL (ref 1.7–7.7)
Neutrophils Relative %: 77 %
Platelets: 127 10*3/uL — ABNORMAL LOW (ref 150–400)
RBC: 3.88 MIL/uL — AB (ref 4.22–5.81)
RDW: 13.1 % (ref 11.5–15.5)
WBC: 7.2 10*3/uL (ref 4.0–10.5)

## 2017-11-09 LAB — URINALYSIS, ROUTINE W REFLEX MICROSCOPIC
BILIRUBIN URINE: NEGATIVE
Glucose, UA: NEGATIVE mg/dL
KETONES UR: 5 mg/dL — AB
LEUKOCYTES UA: NEGATIVE
NITRITE: NEGATIVE
Protein, ur: NEGATIVE mg/dL
SPECIFIC GRAVITY, URINE: 1.018 (ref 1.005–1.030)
pH: 5 (ref 5.0–8.0)

## 2017-11-09 LAB — COMPREHENSIVE METABOLIC PANEL
ALT: 11 U/L (ref 0–44)
AST: 76 U/L — ABNORMAL HIGH (ref 15–41)
Albumin: 3.7 g/dL (ref 3.5–5.0)
Alkaline Phosphatase: 88 U/L (ref 38–126)
Anion gap: 6 (ref 5–15)
BUN: 21 mg/dL (ref 8–23)
CHLORIDE: 104 mmol/L (ref 98–111)
CO2: 30 mmol/L (ref 22–32)
CREATININE: 1.01 mg/dL (ref 0.61–1.24)
Calcium: 8.6 mg/dL — ABNORMAL LOW (ref 8.9–10.3)
Glucose, Bld: 110 mg/dL — ABNORMAL HIGH (ref 70–99)
POTASSIUM: 3.8 mmol/L (ref 3.5–5.1)
Sodium: 140 mmol/L (ref 135–145)
Total Bilirubin: 0.9 mg/dL (ref 0.3–1.2)
Total Protein: 6.4 g/dL — ABNORMAL LOW (ref 6.5–8.1)

## 2017-11-09 MED ORDER — SODIUM CHLORIDE 0.9 % IV BOLUS
500.0000 mL | Freq: Once | INTRAVENOUS | Status: AC
  Administered 2017-11-09: 500 mL via INTRAVENOUS

## 2017-11-09 NOTE — ED Triage Notes (Signed)
Per [facility], pt has Parkinson's Disease. Pt's family states he sometimes has issues falling asleep, and the following morning he may appear confused. Pt's family states that he appeared to be confused today and did not sleep well last night. Per EMS, pt is A&Ox4.   18 G left AC.

## 2017-11-09 NOTE — ED Provider Notes (Signed)
[place] DEPT Provider Note   CSN: [id number] Arrival date & time: 11/09/17  2018     History   Chief Complaint Chief Complaint  Patient presents with  . Generalized Weakness    HPI [name]. is a 77 y.o. male. Level 5 caveat due to dementia. HPI Patient presents after confusion.  Has had some confusion over the last week.  Has had a fall where he hit his left neck on a bedside table.  Has had some difficulty speaking with some confusion.  History of Parkinson's disease and some dementia.  No recent change in medicine.  Has had a decreased oral intake.  No recent change in medicines.  Somewhat decreased appetite.  Has had both constipation and diarrhea over the last week. Past Medical History:  Diagnosis Date  . Anxiety   . Aortic stenosis   . Arthritis    left hip  . Cancer ([place])    basal cell- facial?  . Depression   . GERD (gastroesophageal reflux disease)   . Headache(784.0)   . Heart murmur   . Hernia, inguinal, bilateral, recurrent   . Kidney stones   . Parkinson disease ([place])   . Parkinson's disease (tremor, stiffness, slow motion, unstable posture) ([place]) 12/11/2006  . PONV (postoperative nausea and vomiting)   . Restless leg syndrome   . S/P TAVR (transcatheter aortic valve replacement) 11/18/2013   26 mm Edwards Sapien XT transcatheter heart valve placed via open right transfemoral approach  . TIA (transient ischemic attack) 02/08/2007    Patient Active Problem List   Diagnosis Date Noted  . Dementia due to Parkinson's disease without behavioral disturbance ([place]) 12/16/2015  . S/P TAVR (transcatheter aortic valve replacement) 11/18/2013  . Aortic valve disorder 11/18/2013  . Parkinson's disease ([place])   . Severe aortic stenosis 09/26/2013  . Idiopathic Parkinson's disease ([place]) 10/23/2011    Past Surgical History:  Procedure Laterality Date  . CARDIAC CATHETERIZATION  10/09/13  . cataract  2008   bilateral  .  COLONOSCOPY    . CYSTOSCOPY/RETROGRADE/URETEROSCOPY  05/29/2011   Procedure: CYSTOSCOPY/RETROGRADE/URETEROSCOPY;  Surgeon: Malka So, MD;  Location: [facility];  Service: Urology;  Laterality: Left;  no stent   . EYE SURGERY Bilateral    /w IOL  . INGUINAL HERNIA REPAIR Bilateral 03/30/2014   Procedure: OPEN BILATERAL INGUINAL HERNIA REPAIR WITH MESH;  Surgeon: Coralie Keens, MD;  Location: [facility];  Service: General;  Laterality: Bilateral;  . INSERTION OF MESH Bilateral 03/30/2014   Procedure: INSERTION OF MESH;  Surgeon: Coralie Keens, MD;  Location: [facility];  Service: General;  Laterality: Bilateral;  . INTRAOPERATIVE TRANSESOPHAGEAL ECHOCARDIOGRAM N/A 11/18/2013   Procedure: INTRAOPERATIVE TRANSESOPHAGEAL ECHOCARDIOGRAM;  Surgeon: Sherren Mocha, MD;  Location: [facility] OR;  Service: Open Heart Surgery;  Laterality: N/A;  . LEFT AND RIGHT HEART CATHETERIZATION WITH CORONARY ANGIOGRAM N/A 10/09/2013   Procedure: LEFT AND RIGHT HEART CATHETERIZATION WITH CORONARY ANGIOGRAM;  Surgeon: Blane Ohara, MD;  Location: [facility] CATH LAB;  Service: Cardiovascular;  Laterality: N/A;  . TRANSCATHETER AORTIC VALVE REPLACEMENT, TRANSFEMORAL N/A 11/18/2013   Procedure: TRANSCATHETER AORTIC VALVE REPLACEMENT, TRANSFEMORAL;  Surgeon: Sherren Mocha, MD;  Location: [facility];  Service: Open Heart Surgery;  Laterality: N/A;        Home Medications    Prior to Admission medications   Medication Sig Start Date End Date Taking? Authorizing Provider  acetaminophen (TYLENOL) 500 MG tablet Take 1,000 mg by mouth 2 (two) times daily as needed (  pain). For pain    [provider]  aspirin EC 81 MG tablet Take by mouth.    [provider]  carbidopa-levodopa (SINEMET IR) 25-100 MG tablet Take 2 tablets by mouth 3 (three) times daily. 06/26/17   Tat, Eustace Quail, DO  cholecalciferol (VITAMIN D) 1000 UNITS tablet Take 1,000 Units by mouth at bedtime.     [provider]  clonazePAM (KLONOPIN) 0.5 MG tablet  Take 1 tablet (0.5 mg total) by mouth at bedtime. 09/21/17   Tat, Eustace Quail, DO  donepezil (ARICEPT) 10 MG tablet Take 1 tablet (10 mg total) by mouth at bedtime. 06/26/17   Tat, Eustace Quail, DO  IBUPROFEN PO Take by mouth as needed.    [provider]  MYRBETRIQ 50 MG TB24 tablet Take 50 mg by mouth daily. 08/27/17   [provider]  omeprazole (PRILOSEC) 20 MG capsule Take 20 mg by mouth at bedtime.     [provider]  sertraline (ZOLOFT) 50 MG tablet Take by mouth.    [provider]    Family History Family History  Problem Relation Age of Onset  . Arthritis/Rheumatoid Mother   . Prostate cancer Father   . Anemia Daughter   . Asthma Daughter   . Diabetes Daughter   . Hypertension Daughter   . Kidney disease Daughter   . Cancer Son   . Breast cancer Sister   . Heart attack Neg Hx   . Stroke Neg Hx     Social History Social History   Tobacco Use  . Smoking status: Never Smoker  . Smokeless tobacco: Never Used  Substance Use Topics  . Alcohol use: No  . Drug use: No     Allergies   Penicillins and Vicodin [hydrocodone-acetaminophen]   Review of Systems Review of Systems  Constitutional: Positive for appetite change.  HENT: Negative for congestion.   Respiratory: Negative for shortness of breath.   Gastrointestinal: Negative for abdominal pain.  Genitourinary: Negative for flank pain.  Musculoskeletal: Negative for back pain.  Skin: Negative for rash.  Neurological: Negative for syncope.  Hematological: Negative for adenopathy.  Psychiatric/Behavioral: Positive for confusion.     Physical Exam Updated Vital Signs BP (!) 143/99 (BP Location: Right Arm)   Pulse 82   Temp 99.4 F (37.4 C) (Rectal)   Resp 19   Ht 5\' 6"  (1.676 m)   Wt 74.4 kg (164 lb)   SpO2 100%   BMI 26.47 kg/m   Physical Exam  Constitutional: He appears well-developed.  HENT:  Head: Atraumatic.  Eyes: Pupils are equal, round, and reactive to light.    Neck: Neck supple.  Ecchymosis to left sided lower neck.  No swelling.  Minimal tenderness.  Pulmonary/Chest: Effort normal.  Abdominal: Soft. There is no tenderness.  Musculoskeletal: He exhibits no tenderness.  Neurological: He is alert.  Patient with parkinsonian tremor.  Somewhat difficult to understand.  Some confusion worse than baseline per family member.  Skin: Skin is warm. Capillary refill takes less than 2 seconds.     ED Treatments / Results  Labs (all labs ordered are listed, but only abnormal results are displayed) Labs Reviewed  COMPREHENSIVE METABOLIC PANEL - Abnormal; Notable for the following components:      Result Value   Glucose, Bld 110 (*)    Calcium 8.6 (*)    Total Protein 6.4 (*)    AST 76 (*)    All other components within normal limits  CBC WITH DIFFERENTIAL/PLATELET -  Abnormal; Notable for the following components:   RBC 3.88 (*)    Hemoglobin 11.5 (*)    HCT 34.9 (*)    Platelets 127 (*)    All other components within normal limits  URINALYSIS, ROUTINE W REFLEX MICROSCOPIC - Abnormal; Notable for the following components:   Hgb urine dipstick SMALL (*)    Ketones, ur 5 (*)    Bacteria, UA RARE (*)    All other components within normal limits    EKG None  Radiology Dg Chest 2 View  Result Date: 11/09/2017 CLINICAL DATA:  Altered mental status EXAM: CHEST - 2 VIEW COMPARISON:  11/19/2013 chest radiograph. FINDINGS: Aortic valve prosthesis is in place. Stable cardiomediastinal silhouette with mild cardiomegaly. No pneumothorax. No pleural effusion. Lungs appear clear, with no acute consolidative airspace disease and no pulmonary edema. IMPRESSION: Stable mild cardiomegaly. No pulmonary edema. No active pulmonary disease. Electronically Signed   By: Ilona Sorrel M.D.   On: 11/09/2017 21:33   Ct Head Wo Contrast  Result Date: 11/09/2017 CLINICAL DATA:  History of Parkinson's disease with confusion EXAM: CT HEAD WITHOUT CONTRAST TECHNIQUE:  Contiguous axial images were obtained from the base of the skull through the vertex without intravenous contrast. COMPARISON:  02/08/2007 FINDINGS: Brain: Mild atrophic changes which have progressed in the interval from the prior exam. Lacunar infarct is again noted in the subinsular region on the right stable from the prior exam. No findings to suggest acute hemorrhage, acute infarction or space-occupying mass lesion are noted. Vascular: No hyperdense vessel or unexpected calcification. Skull: Normal. Negative for fracture or focal lesion. Sinuses/Orbits: No acute finding. Other: None. IMPRESSION: Chronic atrophic changes without acute abnormality. Electronically Signed   By: [name] Catalina M.D.   On: 11/09/2017 21:34    Procedures Procedures (including critical care time)  Medications Ordered in ED Medications  sodium chloride 0.9 % bolus 500 mL (500 mLs Intravenous New Bag/Given 11/09/17 2305)     Initial Impression / Assessment and Plan / ED Course  I have reviewed the triage vital signs and the nursing notes.  Pertinent labs & imaging results that were available during my care of the patient were reviewed by me and considered in my medical decision making (see chart for details).    Patient with confusion.  Does have history of dementia.  Somewhat decreased oral intake.  Afebrile.  Chest and urine reassuring.  Lab work overall reassuring.  Urine does have some mild ketones.  Fluid bolus have been given.  Discharge home with family member.  May be mild dehydration or potentially related to patient's baseline dementia.  Final Clinical Impressions(s) / ED Diagnoses   Final diagnoses:  Confusion    ED Discharge Orders    None       Davonna Belling, MD 11/09/17 2337

## 2017-11-09 NOTE — ED Notes (Signed)
Patient transported to X-ray 

## 2017-11-09 NOTE — ED Notes (Signed)
Bed: RESA Expected date:  Expected time:  Means of arrival:  Comments: EMS 77 yo male from home/confused since yesterday-weak for 1 week

## 2017-11-13 ENCOUNTER — Encounter: Payer: Self-pay | Admitting: Neurology

## 2017-11-16 ENCOUNTER — Ambulatory Visit
Admission: RE | Admit: 2017-11-16 | Discharge: 2017-11-16 | Disposition: A | Discharge status: Home / Self Care | Payer: Medicare Other | Source: Ambulatory Visit | Attending: Neurology | Admitting: Neurology

## 2017-11-16 ENCOUNTER — Encounter: Payer: Self-pay | Admitting: Neurology

## 2017-11-16 ENCOUNTER — Ambulatory Visit (INDEPENDENT_AMBULATORY_CARE_PROVIDER_SITE_OTHER): Payer: Medicare Other | Admitting: Neurology

## 2017-11-16 ENCOUNTER — Telehealth: Payer: Self-pay | Admitting: Neurology

## 2017-11-16 VITALS — BP 118/78 | HR 72 | Ht 66.0 in | Wt 127.0 lb

## 2017-11-16 DIAGNOSIS — D649 Anemia, unspecified: Secondary | ICD-10-CM | POA: Diagnosis not present

## 2017-11-16 DIAGNOSIS — R748 Abnormal levels of other serum enzymes: Secondary | ICD-10-CM

## 2017-11-16 DIAGNOSIS — R197 Diarrhea, unspecified: Secondary | ICD-10-CM

## 2017-11-16 DIAGNOSIS — R1084 Generalized abdominal pain: Secondary | ICD-10-CM

## 2017-11-16 DIAGNOSIS — R319 Hematuria, unspecified: Secondary | ICD-10-CM

## 2017-11-16 DIAGNOSIS — R6889 Other general symptoms and signs: Secondary | ICD-10-CM | POA: Diagnosis not present

## 2017-11-16 DIAGNOSIS — G20 Parkinson's disease: Secondary | ICD-10-CM

## 2017-11-16 NOTE — Telephone Encounter (Signed)
Left message on machine for patient's daughter to make her aware abdominal Xray okay per Dr. Carles Collet.
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
## 2017-11-17 LAB — COMPREHENSIVE METABOLIC PANEL
AG RATIO: 1.9 (calc) (ref 1.0–2.5)
ALBUMIN MSPROF: 4.1 g/dL (ref 3.6–5.1)
ALKALINE PHOSPHATASE (APISO): 116 U/L — AB (ref 40–115)
ALT: 5 U/L — ABNORMAL LOW (ref 9–46)
AST: 20 U/L (ref 10–35)
BUN: 15 mg/dL (ref 7–25)
CALCIUM: 9.2 mg/dL (ref 8.6–10.3)
CO2: 29 mmol/L (ref 20–32)
Chloride: 102 mmol/L (ref 98–110)
Creat: 1.02 mg/dL (ref 0.70–1.18)
GLOBULIN: 2.2 g/dL (ref 1.9–3.7)
Glucose, Bld: 90 mg/dL (ref 65–99)
Potassium: 4.2 mmol/L (ref 3.5–5.3)
SODIUM: 138 mmol/L (ref 135–146)
Total Bilirubin: 0.7 mg/dL (ref 0.2–1.2)
Total Protein: 6.3 g/dL (ref 6.1–8.1)

## 2017-11-17 LAB — TSH: TSH: 1.57 mIU/L (ref 0.40–4.50)

## 2017-11-17 LAB — AMMONIA: Ammonia: 41 umol/L (ref <=72)

## 2017-11-17 LAB — CBC WITH DIFFERENTIAL/PLATELET
BASOS PCT: 0.3 %
Basophils Absolute: 22 cells/uL (ref 0–200)
EOS ABS: 118 {cells}/uL (ref 15–500)
Eosinophils Relative: 1.6 %
HCT: 36.8 % — ABNORMAL LOW (ref 38.5–50.0)
HEMOGLOBIN: 12.2 g/dL — AB (ref 13.2–17.1)
Lymphs Abs: 1332 cells/uL (ref 850–3900)
MCH: 29.3 pg (ref 27.0–33.0)
MCHC: 33.2 g/dL (ref 32.0–36.0)
MCV: 88.5 fL (ref 80.0–100.0)
MONOS PCT: 7.8 %
MPV: 11.5 fL (ref 7.5–12.5)
NEUTROS ABS: 5350 {cells}/uL (ref 1500–7800)
Neutrophils Relative %: 72.3 %
PLATELETS: 203 10*3/uL (ref 140–400)
RBC: 4.16 10*6/uL — AB (ref 4.20–5.80)
RDW: 11.7 % (ref 11.0–15.0)
TOTAL LYMPHOCYTE: 18 %
WBC: 7.4 10*3/uL (ref 3.8–10.8)
WBCMIX: 577 {cells}/uL (ref 200–950)

## 2017-11-17 LAB — VITAMIN B12: VITAMIN B 12: 732 pg/mL (ref 200–1100)

## 2017-11-17 LAB — CK: Total CK: 160 U/L (ref 44–196)

## 2017-11-19 ENCOUNTER — Telehealth: Payer: Self-pay | Admitting: Neurology

## 2017-11-19 ENCOUNTER — Other Ambulatory Visit: Payer: Self-pay | Admitting: Neurology

## 2017-11-19 NOTE — Telephone Encounter (Signed)
-----   Message from [place], DO sent at 11/19/2017  7:32 AM EDT ----- Let pt/daughter know that so far labs look stable or even improved compared to previous.  Awaiting on urine (could they not give sample)?  They need to f/u with a PCP as well

## 2017-11-19 NOTE — Telephone Encounter (Signed)
Mychart message sent to patient.

## 2017-12-19 ENCOUNTER — Encounter: Payer: Self-pay | Admitting: Cardiology

## 2017-12-19 ENCOUNTER — Ambulatory Visit (INDEPENDENT_AMBULATORY_CARE_PROVIDER_SITE_OTHER): Payer: Medicare Other | Admitting: Cardiology

## 2017-12-19 VITALS — BP 100/60 | HR 63 | Ht 66.0 in | Wt 131.8 lb

## 2017-12-19 DIAGNOSIS — Z952 Presence of prosthetic heart valve: Secondary | ICD-10-CM

## 2017-12-19 DIAGNOSIS — I359 Nonrheumatic aortic valve disorder, unspecified: Secondary | ICD-10-CM | POA: Diagnosis not present

## 2017-12-19 DIAGNOSIS — I350 Nonrheumatic aortic (valve) stenosis: Secondary | ICD-10-CM

## 2017-12-19 MED ORDER — ASPIRIN EC 81 MG PO TBEC: 81.0000 mg | DELAYED_RELEASE_TABLET | Freq: Every day | ORAL | Status: AC

## 2017-12-19 NOTE — Progress Notes (Signed)
[facility]. [address], [address] [place], [place]  [address] Phone: [phone] Fax:  [phone]  Date:  12/19/2017   ID:  [name]., DOB March 04, 1941, MRN [id number]  PCP:  Leeroy Cha, MD   History of Present Illness: [name]. is a 77 y.o. male with Parkinson's disease status post transcatheter aortic valve replacement #26 mm Edwards's AP and valve on 11/18/13 secondary to severe aortic stenosis.  He is doing very well. Has mild to moderate perivalvular leak. Overall normal ejection fraction. Feeling well. Overall he is doing well without any syncope, no chest pain, no shortness of breath.   Catheterization showed essentially normal coronary arteries.  His Parkinson's has progressed. More difficult to vocalize.  His blood pressure has been low recently. At home sometimes in the 93A systolic. He has not had any syncope. Daughter called. Stopped metoprolol. Agree. Needs to try to consume more liquids, salt. Occasionally may have some chest heaviness at times, may be GERD associated. Heart valve sounds excellent.  12/19/2017-overall been doing quite well.  Parkinson's has been relatively stable.  No chest pain, no shortness of breath, no syncope.  No fevers.  He is taking dental prophylaxis.  Wt Readings from Last 3 Encounters:  12/19/17 131 lb 12.8 oz (59.8 kg)  11/16/17 127 lb (57.6 kg)  11/09/17 164 lb (74.4 kg)     Past Medical History:  Diagnosis Date  . Anxiety   . Aortic stenosis   . Arthritis    left hip  . Cancer ([place])    basal cell- facial?  . Depression   . GERD (gastroesophageal reflux disease)   . Headache(784.0)   . Heart murmur   . Hernia, inguinal, bilateral, recurrent   . Kidney stones   . Parkinson disease ([place])   . Parkinson's disease (tremor, stiffness, slow motion, unstable posture) ([place]) 12/11/2006  . PONV (postoperative nausea and vomiting)   . Restless leg syndrome   . S/P TAVR (transcatheter aortic valve  replacement) 11/18/2013   26 mm Edwards Sapien XT transcatheter heart valve placed via open right transfemoral approach  . TIA (transient ischemic attack) 02/08/2007    Past Surgical History:  Procedure Laterality Date  . CARDIAC CATHETERIZATION  10/09/13  . cataract  2008   bilateral  . COLONOSCOPY    . CYSTOSCOPY/RETROGRADE/URETEROSCOPY  05/29/2011   Procedure: CYSTOSCOPY/RETROGRADE/URETEROSCOPY;  Surgeon: Malka So, MD;  [facility]: [facility];  Service: Urology;  Laterality: Left;  no stent   . EYE SURGERY Bilateral    /w IOL  . INGUINAL HERNIA REPAIR Bilateral 03/30/2014   Procedure: OPEN BILATERAL INGUINAL HERNIA REPAIR WITH MESH;  Surgeon: Coralie Keens, MD;  Location: [place];  Service: General;  Laterality: Bilateral;  . INSERTION OF MESH Bilateral 03/30/2014   Procedure: INSERTION OF MESH;  Surgeon: Coralie Keens, MD;  Location: [place];  Service: General;  Laterality: Bilateral;  . INTRAOPERATIVE TRANSESOPHAGEAL ECHOCARDIOGRAM N/A 11/18/2013   Procedure: INTRAOPERATIVE TRANSESOPHAGEAL ECHOCARDIOGRAM;  Surgeon: Sherren Mocha, MD;  Location: [facility];  Service: Open Heart Surgery;  Laterality: N/A;  . LEFT AND RIGHT HEART CATHETERIZATION WITH CORONARY ANGIOGRAM N/A 10/09/2013   Procedure: LEFT AND RIGHT HEART CATHETERIZATION WITH CORONARY ANGIOGRAM;  Surgeon: Blane Ohara, MD;  Location: [facility] CATH LAB;  Service: Cardiovascular;  Laterality: N/A;  . TRANSCATHETER AORTIC VALVE REPLACEMENT, TRANSFEMORAL N/A 11/18/2013   Procedure: TRANSCATHETER AORTIC VALVE REPLACEMENT, TRANSFEMORAL;  Surgeon: Sherren Mocha, MD;  Location: [place];  Service: Open Heart  Surgery;  Laterality: N/A;    Current Outpatient Medications  Medication Sig Dispense Refill  . acetaminophen (TYLENOL) 500 MG tablet Take 1,000 mg by mouth 2 (two) times daily as needed (pain). For pain    . aspirin EC 81 MG tablet Take 1 tablet (81 mg total) by mouth daily.    . carbidopa-levodopa (SINEMET IR) 25-100 MG tablet Take 2 tablets  by mouth 3 (three) times daily. 540 tablet 1  . cholecalciferol (VITAMIN D) 1000 UNITS tablet Take 1,000 Units by mouth at bedtime.     . clonazePAM (KLONOPIN) 0.5 MG tablet Take 1 tablet (0.5 mg total) by mouth at bedtime. 90 tablet 1  . donepezil (ARICEPT) 10 MG tablet Take 1 tablet (10 mg total) by mouth at bedtime. 90 tablet 1  . IBUPROFEN PO Take by mouth as needed.    Marland Kitchen MYRBETRIQ 50 MG TB24 tablet Take 50 mg by mouth daily.  11  . omeprazole (PRILOSEC) 20 MG capsule Take 20 mg by mouth at bedtime.     . sertraline (ZOLOFT) 50 MG tablet Take by mouth.     No current facility-administered medications for this visit.     Allergies:    Allergies  Allergen Reactions  . Penicillins     Unsure of reaction  . Vicodin [Hydrocodone-Acetaminophen] Other (See Comments)    "Mental reaction"    Social History:  The patient  reports that he has never smoked. He has never used smokeless tobacco. He reports that he does not drink alcohol or use drugs.   Family History  Problem Relation Age of Onset  . Arthritis/Rheumatoid Mother   . Prostate cancer Father   . Anemia Daughter   . Asthma Daughter   . Diabetes Daughter   . Hypertension Daughter   . Kidney disease Daughter   . Cancer Son   . Breast cancer Sister   . Heart attack Neg Hx   . Stroke Neg Hx     ROS:  Please see the history of present illness.   Denies any fevers, chills, orthopnea, PND   question of fatigue, chest pressure. All other systems reviewed and negative.   PHYSICAL EXAM: VS:  BP 100/60   Pulse 63   Ht 5\' 6"  (1.676 m)   Wt 131 lb 12.8 oz (59.8 kg)   BMI 21.27 kg/m  GEN: Well nourished, well developed, in no acute distress  HEENT: normal  Neck: no JVD, carotid bruits, or masses Cardiac: RRR; 2/6 SM, no rubs, or gallops,no edema  Respiratory:  clear to auscultation bilaterally, normal work of breathing GI: soft, nontender, nondistended, + BS MS: no deformity or atrophy  Skin: warm and dry, no rash Neuro:   Alert , Parkinsonian Psych: euthymic mood, full affect   EKG: EKG today 01/11/16-sinus bradycardia rate 56 with no other abnormalities.     ECHO 12/10/2014: - Left ventricle: The cavity size was normal. Wall thickness was   normal. Systolic function was normal. The estimated ejection   fraction was in the range of 60% to 65%. Wall motion was normal;   there were no regional wall motion abnormalities. Doppler   parameters are consistent with abnormal left ventricular   relaxation (grade 1 diastolic dysfunction). - Aortic valve: A bioprosthesis was present. There was mild   regurgitation. - Mitral valve: Calcified annulus. - Left atrium: The atrium was moderately dilated. - Right atrium: The atrium was mildly dilated. - Atrial septum: There was an atrial septal aneurysm. - Pulmonary arteries: Systolic  pressure was mildly increased.  Impressions:  - Normal LV function with grade 1 diastolic dysfunction; moderate   LAE; mild RAE; s/p TAVR with what appears to be 2 sites of mild   periprosthetic AI; normal mean gradient of 14 mmHg; mild TR with   mildly elevated pulmonary pressures. Compared to 12/12/13, AI   appears to be unchanged.  ASSESSMENT AND PLAN:  1. History of aortic valve replacement via TAVR July 2015- doing very well, mild  perivalvular leak. Echocardiogram reviewed.  Asymptomatic. He should continue with his aspirin 81 mg. We will continue with low-dose metoprolol.  Reviewed cardiac catheterization which showed normal/no flow-limiting coronary artery disease.  Able.  Subtle murmur heard on exam.  Continue to follow clinically. 2. Parkinson's disease-neurology.  Dr. Carles Collet. 3. Hypotension- continue to monitor.  Daughter called, stopped metoprolol dose previously. I agree. Salt liberalization, fluids. His autonomic dysfunction with Parkinson's will make him prone for hypotension. Be careful when getting up. Mild bradycardia noted on ECG should not be an issue. Encouraged protein.    4. Reminded him of dental prophylaxis.  Did this. 5. We will see him back in 12 months.  Signed, Candee Furbish, MD [facility]  12/19/2017 4:25 PM

## 2017-12-19 NOTE — Progress Notes (Signed)
12  

## 2017-12-19 NOTE — Patient Instructions (Signed)
Medication Instructions:  Your physician recommends that you continue on your current medications as directed. Please refer to the Current Medication list given to you today.  Follow-Up: Your physician wants you to follow-up in: 1 year with Dr. Skains. You will receive a reminder letter in the mail two months in advance. If you don't receive a letter, please call our office to schedule the follow-up appointment.  If you need a refill on your cardiac medications before your next appointment, please call your pharmacy.   

## 2017-12-27 DIAGNOSIS — G4752 REM sleep behavior disorder: Secondary | ICD-10-CM | POA: Diagnosis not present

## 2017-12-27 DIAGNOSIS — K219 Gastro-esophageal reflux disease without esophagitis: Secondary | ICD-10-CM | POA: Diagnosis not present

## 2017-12-27 DIAGNOSIS — R350 Frequency of micturition: Secondary | ICD-10-CM | POA: Diagnosis not present

## 2017-12-27 DIAGNOSIS — G20 Parkinson's disease: Secondary | ICD-10-CM | POA: Diagnosis not present

## 2017-12-27 DIAGNOSIS — R634 Abnormal weight loss: Secondary | ICD-10-CM | POA: Diagnosis not present

## 2017-12-27 DIAGNOSIS — F418 Other specified anxiety disorders: Secondary | ICD-10-CM | POA: Diagnosis not present

## 2017-12-27 DIAGNOSIS — G2581 Restless legs syndrome: Secondary | ICD-10-CM | POA: Diagnosis not present
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
## 2018-01-31 ENCOUNTER — Encounter: Payer: Self-pay | Admitting: Neurology

## 2018-01-31 ENCOUNTER — Ambulatory Visit (INDEPENDENT_AMBULATORY_CARE_PROVIDER_SITE_OTHER): Payer: Medicare Other | Admitting: Neurology

## 2018-01-31 VITALS — BP 112/64 | HR 60 | Ht 66.0 in | Wt 134.0 lb

## 2018-01-31 DIAGNOSIS — G20 Parkinson's disease: Secondary | ICD-10-CM | POA: Diagnosis not present

## 2018-01-31 DIAGNOSIS — R441 Visual hallucinations: Secondary | ICD-10-CM

## 2018-01-31 DIAGNOSIS — F0280 Dementia in other diseases classified elsewhere without behavioral disturbance: Secondary | ICD-10-CM

## 2018-03-01 DIAGNOSIS — H00012 Hordeolum externum right lower eyelid: Secondary | ICD-10-CM | POA: Diagnosis not present

## 2018-03-01 DIAGNOSIS — H00022 Hordeolum internum right lower eyelid: Secondary | ICD-10-CM | POA: Diagnosis not present

## 2018-03-01 DIAGNOSIS — Z23 Encounter for immunization: Secondary | ICD-10-CM | POA: Diagnosis not present

## 2018-03-01 DIAGNOSIS — L03213 Periorbital cellulitis: Secondary | ICD-10-CM | POA: Diagnosis not present

## 2018-03-08 DIAGNOSIS — H00022 Hordeolum internum right lower eyelid: Secondary | ICD-10-CM | POA: Diagnosis not present

## 2018-03-19 ENCOUNTER — Other Ambulatory Visit: Payer: Self-pay | Admitting: Neurology

## 2018-03-25 DIAGNOSIS — F418 Other specified anxiety disorders: Secondary | ICD-10-CM | POA: Diagnosis not present

## 2018-03-25 DIAGNOSIS — Z0000 Encounter for general adult medical examination without abnormal findings: Secondary | ICD-10-CM | POA: Diagnosis not present

## 2018-03-25 DIAGNOSIS — G20 Parkinson's disease: Secondary | ICD-10-CM | POA: Diagnosis not present

## 2018-03-25 DIAGNOSIS — G2581 Restless legs syndrome: Secondary | ICD-10-CM | POA: Diagnosis not present

## 2018-03-25 DIAGNOSIS — G4752 REM sleep behavior disorder: Secondary | ICD-10-CM | POA: Diagnosis not present

## 2018-03-25 DIAGNOSIS — Z1389 Encounter for screening for other disorder: Secondary | ICD-10-CM | POA: Diagnosis not present

## 2018-03-25 DIAGNOSIS — R350 Frequency of micturition: Secondary | ICD-10-CM | POA: Diagnosis not present

## 2018-03-25 DIAGNOSIS — R634 Abnormal weight loss: Secondary | ICD-10-CM | POA: Diagnosis not present

## 2018-04-08 ENCOUNTER — Observation Stay (HOSPITAL_COMMUNITY): Payer: Medicare Other

## 2018-04-08 ENCOUNTER — Inpatient Hospital Stay (HOSPITAL_COMMUNITY)
Admission: EM | Admit: 2018-04-08 | Discharge: 2018-04-15 | DRG: 056 | Disposition: A | Discharge status: Rehabilitation Facility | Payer: Medicare Other | Attending: Internal Medicine | Admitting: Internal Medicine

## 2018-04-08 ENCOUNTER — Other Ambulatory Visit: Payer: Self-pay

## 2018-04-08 ENCOUNTER — Telehealth: Payer: Self-pay | Admitting: Neurology

## 2018-04-08 ENCOUNTER — Encounter (HOSPITAL_COMMUNITY): Payer: Self-pay | Admitting: Emergency Medicine

## 2018-04-08 ENCOUNTER — Emergency Department (HOSPITAL_COMMUNITY): Payer: Medicare Other

## 2018-04-08 DIAGNOSIS — G2581 Restless legs syndrome: Secondary | ICD-10-CM | POA: Diagnosis present

## 2018-04-08 DIAGNOSIS — N39490 Overflow incontinence: Secondary | ICD-10-CM | POA: Diagnosis present

## 2018-04-08 DIAGNOSIS — I5032 Chronic diastolic (congestive) heart failure: Secondary | ICD-10-CM | POA: Diagnosis present

## 2018-04-08 DIAGNOSIS — F419 Anxiety disorder, unspecified: Secondary | ICD-10-CM | POA: Diagnosis present

## 2018-04-08 DIAGNOSIS — R531 Weakness: Secondary | ICD-10-CM | POA: Diagnosis present

## 2018-04-08 DIAGNOSIS — E1142 Type 2 diabetes mellitus with diabetic polyneuropathy: Secondary | ICD-10-CM | POA: Diagnosis present

## 2018-04-08 DIAGNOSIS — F329 Major depressive disorder, single episode, unspecified: Secondary | ICD-10-CM | POA: Diagnosis not present

## 2018-04-08 DIAGNOSIS — N183 Chronic kidney disease, stage 3 (moderate): Secondary | ICD-10-CM | POA: Diagnosis present

## 2018-04-08 DIAGNOSIS — K5900 Constipation, unspecified: Secondary | ICD-10-CM | POA: Diagnosis present

## 2018-04-08 DIAGNOSIS — Z79899 Other long term (current) drug therapy: Secondary | ICD-10-CM

## 2018-04-08 DIAGNOSIS — R5381 Other malaise: Secondary | ICD-10-CM | POA: Diagnosis not present

## 2018-04-08 DIAGNOSIS — R4182 Altered mental status, unspecified: Secondary | ICD-10-CM | POA: Diagnosis not present

## 2018-04-08 DIAGNOSIS — Z880 Allergy status to penicillin: Secondary | ICD-10-CM

## 2018-04-08 DIAGNOSIS — F0280 Dementia in other diseases classified elsewhere without behavioral disturbance: Secondary | ICD-10-CM | POA: Diagnosis present

## 2018-04-08 DIAGNOSIS — G20 Parkinson's disease: Principal | ICD-10-CM | POA: Diagnosis present

## 2018-04-08 DIAGNOSIS — Z952 Presence of prosthetic heart valve: Secondary | ICD-10-CM

## 2018-04-08 DIAGNOSIS — R29810 Facial weakness: Secondary | ICD-10-CM | POA: Diagnosis present

## 2018-04-08 DIAGNOSIS — I10 Essential (primary) hypertension: Secondary | ICD-10-CM | POA: Diagnosis present

## 2018-04-08 DIAGNOSIS — I5189 Other ill-defined heart diseases: Secondary | ICD-10-CM

## 2018-04-08 DIAGNOSIS — G459 Transient cerebral ischemic attack, unspecified: Secondary | ICD-10-CM | POA: Diagnosis present

## 2018-04-08 DIAGNOSIS — C44310 Basal cell carcinoma of skin of unspecified parts of face: Secondary | ICD-10-CM | POA: Diagnosis present

## 2018-04-08 DIAGNOSIS — H109 Unspecified conjunctivitis: Secondary | ICD-10-CM | POA: Diagnosis not present

## 2018-04-08 DIAGNOSIS — I130 Hypertensive heart and chronic kidney disease with heart failure and stage 1 through stage 4 chronic kidney disease, or unspecified chronic kidney disease: Secondary | ICD-10-CM | POA: Diagnosis not present

## 2018-04-08 DIAGNOSIS — E43 Unspecified severe protein-calorie malnutrition: Secondary | ICD-10-CM | POA: Diagnosis not present

## 2018-04-08 DIAGNOSIS — Z953 Presence of xenogenic heart valve: Secondary | ICD-10-CM

## 2018-04-08 DIAGNOSIS — E1122 Type 2 diabetes mellitus with diabetic chronic kidney disease: Secondary | ICD-10-CM | POA: Diagnosis present

## 2018-04-08 DIAGNOSIS — Z66 Do not resuscitate: Secondary | ICD-10-CM | POA: Diagnosis present

## 2018-04-08 DIAGNOSIS — N3281 Overactive bladder: Secondary | ICD-10-CM

## 2018-04-08 DIAGNOSIS — Z885 Allergy status to narcotic agent status: Secondary | ICD-10-CM

## 2018-04-08 DIAGNOSIS — Z6820 Body mass index (BMI) 20.0-20.9, adult: Secondary | ICD-10-CM

## 2018-04-08 DIAGNOSIS — F0281 Dementia in other diseases classified elsewhere with behavioral disturbance: Secondary | ICD-10-CM | POA: Diagnosis not present

## 2018-04-08 DIAGNOSIS — R9431 Abnormal electrocardiogram [ECG] [EKG]: Secondary | ICD-10-CM | POA: Diagnosis not present

## 2018-04-08 DIAGNOSIS — K5901 Slow transit constipation: Secondary | ICD-10-CM

## 2018-04-08 DIAGNOSIS — D696 Thrombocytopenia, unspecified: Secondary | ICD-10-CM | POA: Diagnosis not present

## 2018-04-08 DIAGNOSIS — Z7982 Long term (current) use of aspirin: Secondary | ICD-10-CM

## 2018-04-08 HISTORY — DX: Essential (primary) hypertension: I10

## 2018-04-08 HISTORY — DX: Type 2 diabetes mellitus without complications: E11.9

## 2018-04-08 LAB — URINALYSIS, ROUTINE W REFLEX MICROSCOPIC
Bilirubin Urine: NEGATIVE
Glucose, UA: NEGATIVE mg/dL
HGB URINE DIPSTICK: NEGATIVE
KETONES UR: NEGATIVE mg/dL
Leukocytes, UA: NEGATIVE
Nitrite: NEGATIVE
PROTEIN: NEGATIVE mg/dL
Specific Gravity, Urine: 1.025 (ref 1.005–1.030)
pH: 5 (ref 5.0–8.0)

## 2018-04-08 LAB — CBC WITH DIFFERENTIAL/PLATELET
Abs Immature Granulocytes: 0.06 10*3/uL (ref 0.00–0.07)
BASOS ABS: 0 10*3/uL (ref 0.0–0.1)
Basophils Relative: 0 %
EOS PCT: 4 %
Eosinophils Absolute: 0.2 10*3/uL (ref 0.0–0.5)
HEMATOCRIT: 40.4 % (ref 39.0–52.0)
HEMOGLOBIN: 12.6 g/dL — AB (ref 13.0–17.0)
IMMATURE GRANULOCYTES: 1 %
LYMPHS ABS: 0.8 10*3/uL (ref 0.7–4.0)
LYMPHS PCT: 18 %
MCH: 28.5 pg (ref 26.0–34.0)
MCHC: 31.2 g/dL (ref 30.0–36.0)
MCV: 91.4 fL (ref 80.0–100.0)
Monocytes Absolute: 0.3 10*3/uL (ref 0.1–1.0)
Monocytes Relative: 8 %
NEUTROS PCT: 69 %
Neutro Abs: 3.1 10*3/uL (ref 1.7–7.7)
Platelets: 75 10*3/uL — ABNORMAL LOW (ref 150–400)
RBC: 4.42 MIL/uL (ref 4.22–5.81)
RDW: 12.8 % (ref 11.5–15.5)
WBC: 4.5 10*3/uL (ref 4.0–10.5)
nRBC: 0 % (ref 0.0–0.2)

## 2018-04-08 LAB — COMPREHENSIVE METABOLIC PANEL
ALK PHOS: 76 U/L (ref 38–126)
ALT: 14 U/L (ref 0–44)
AST: 15 U/L (ref 15–41)
Albumin: 3.7 g/dL (ref 3.5–5.0)
Anion gap: 5 (ref 5–15)
BILIRUBIN TOTAL: 0.6 mg/dL (ref 0.3–1.2)
BUN: 16 mg/dL (ref 8–23)
CALCIUM: 9 mg/dL (ref 8.9–10.3)
CO2: 28 mmol/L (ref 22–32)
Chloride: 110 mmol/L (ref 98–111)
Creatinine, Ser: 1.04 mg/dL (ref 0.61–1.24)
GFR calc Af Amer: >60 mL/min (ref >60)
GFR calc non Af Amer: >60 mL/min (ref >60)
GLUCOSE: 114 mg/dL — AB (ref 70–99)
Potassium: 4 mmol/L (ref 3.5–5.1)
Sodium: 143 mmol/L (ref 135–145)
TOTAL PROTEIN: 6 g/dL — AB (ref 6.5–8.1)

## 2018-04-08 LAB — TROPONIN I: Troponin I: <0.03 ng/mL (ref <0.03)

## 2018-04-08 MED ORDER — ONDANSETRON HCL 4 MG PO TABS: 4.0000 mg | ORAL_TABLET | Freq: Four times a day (QID) | ORAL | Status: DC | PRN

## 2018-04-08 MED ORDER — CARBIDOPA-LEVODOPA 25-100 MG PO TABS
2.0000 | ORAL_TABLET | Freq: Three times a day (TID) | ORAL | Status: DC
  Administered 2018-04-09: 2 via ORAL
  Filled 2018-04-08 (×3): qty 2

## 2018-04-08 MED ORDER — CARBIDOPA-LEVODOPA 25-100 MG PO TABS
1.0000 | ORAL_TABLET | ORAL | Status: DC
  Filled 2018-04-08: qty 1

## 2018-04-08 MED ORDER — VITAMIN D 25 MCG (1000 UNIT) PO TABS
1000.0000 [IU] | ORAL_TABLET | Freq: Every day | ORAL | Status: DC
  Administered 2018-04-08 – 2018-04-14 (×7): 1000 [IU] via ORAL
  Filled 2018-04-08 (×7): qty 1

## 2018-04-08 MED ORDER — SODIUM CHLORIDE 0.9% FLUSH: 3.0000 mL | INTRAVENOUS | Status: DC | PRN

## 2018-04-08 MED ORDER — ONDANSETRON HCL 4 MG/2ML IJ SOLN: 4.0000 mg | Freq: Four times a day (QID) | INTRAMUSCULAR | Status: DC | PRN

## 2018-04-08 MED ORDER — CARBIDOPA-LEVODOPA 25-100 MG PO TABS: 2.0000 | ORAL_TABLET | Freq: Three times a day (TID) | ORAL | Status: DC

## 2018-04-08 MED ORDER — STROKE: EARLY STAGES OF RECOVERY BOOK
Freq: Once | Status: AC
  Administered 2018-04-08: 22:00:00
  Filled 2018-04-08: qty 1

## 2018-04-08 MED ORDER — TETRACAINE HCL 0.5 % OP SOLN
2.0000 [drp] | Freq: Once | OPHTHALMIC | Status: AC
  Administered 2018-04-08: 2 [drp] via OPHTHALMIC
  Filled 2018-04-08: qty 4

## 2018-04-08 MED ORDER — MIRABEGRON ER 25 MG PO TB24
50.0000 mg | ORAL_TABLET | Freq: Every day | ORAL | Status: DC
  Administered 2018-04-09 – 2018-04-15 (×7): 50 mg via ORAL
  Filled 2018-04-08 (×7): qty 2

## 2018-04-08 MED ORDER — ASPIRIN EC 81 MG PO TBEC
81.0000 mg | DELAYED_RELEASE_TABLET | Freq: Every day | ORAL | Status: DC
  Administered 2018-04-09 – 2018-04-15 (×7): 81 mg via ORAL
  Filled 2018-04-08 (×7): qty 1

## 2018-04-08 MED ORDER — SODIUM CHLORIDE 0.9 % IV SOLN: 250.0000 mL | INTRAVENOUS | Status: DC | PRN

## 2018-04-08 MED ORDER — SODIUM CHLORIDE 0.9% FLUSH
3.0000 mL | Freq: Two times a day (BID) | INTRAVENOUS | Status: DC
  Administered 2018-04-08 – 2018-04-10 (×5): 3 mL via INTRAVENOUS

## 2018-04-08 MED ORDER — ACETAMINOPHEN 650 MG RE SUPP: 650.0000 mg | Freq: Four times a day (QID) | RECTAL | Status: DC | PRN

## 2018-04-08 MED ORDER — ENOXAPARIN SODIUM 40 MG/0.4ML ~~LOC~~ SOLN
40.0000 mg | SUBCUTANEOUS | Status: DC
  Administered 2018-04-08 – 2018-04-14 (×7): 40 mg via SUBCUTANEOUS
  Filled 2018-04-08 (×7): qty 0.4

## 2018-04-08 MED ORDER — ACETAMINOPHEN 325 MG PO TABS
650.0000 mg | ORAL_TABLET | Freq: Four times a day (QID) | ORAL | Status: DC | PRN
  Administered 2018-04-09: 650 mg via ORAL
  Filled 2018-04-08: qty 2

## 2018-04-08 MED ORDER — SERTRALINE HCL 25 MG PO TABS
12.5000 mg | ORAL_TABLET | Freq: Every day | ORAL | Status: DC
  Administered 2018-04-09 – 2018-04-15 (×7): 12.5 mg via ORAL
  Filled 2018-04-08 (×7): qty 0.5

## 2018-04-08 MED ORDER — FLUORESCEIN SODIUM 1 MG OP STRP
1.0000 | ORAL_STRIP | Freq: Once | OPHTHALMIC | Status: AC
  Administered 2018-04-08: 1 via OPHTHALMIC
  Filled 2018-04-08: qty 1

## 2018-04-08 MED ORDER — CLONAZEPAM 0.5 MG PO TABS
0.5000 mg | ORAL_TABLET | Freq: Every day | ORAL | Status: DC
  Administered 2018-04-08 – 2018-04-14 (×7): 0.5 mg via ORAL
  Filled 2018-04-08 (×7): qty 1

## 2018-04-08 MED ORDER — DONEPEZIL HCL 10 MG PO TABS
10.0000 mg | ORAL_TABLET | Freq: Every day | ORAL | Status: DC
  Administered 2018-04-08 – 2018-04-14 (×7): 10 mg via ORAL
  Filled 2018-04-08 (×7): qty 1

## 2018-04-08 NOTE — Telephone Encounter (Signed)
Called patient's daughter. They decided to take patient to the hospital for evaluation. He is currently at [facility] and undergoing a CT scan.

## 2018-04-08 NOTE — Telephone Encounter (Signed)
Call and find out how doing today.  Eyes sound like eyelid opening apraxia, which can happen in PD patients.

## 2018-04-08 NOTE — ED Triage Notes (Signed)
Arrived via EMS. Onset one day ago LKW 1000 per family who took patient to church. Complaint of bilateral eyes heaviness and drainage with general weakness. Alert answering and following commands appropriate.

## 2018-04-08 NOTE — H&P (Signed)
History and Physical    [name]. NLG:[id number] DOB: 30-Jan-1941 DOA: 04/08/2018  PCP: Pa, [place]   Patient coming from: Home   Chief Complaint: Rigidity, and worsening ambulatory dysfunction.  HPI: [name]. is a 77 y.o. male with medical history significant of Parkinson's disease, depression, history of TIA, history of aortic stenosis status post TAVR and type 2 diabetes mellitus.  Patient has been at his usual state of health until 24 hours ago when he was noticed to be more stiff, difficulty ambulating and coordinating his extremities, he kept his eyes closed, no frank confusion or agitation.  He is able to live independently but for the last 24 hours her daughter to be with him to prevent falls to guide him.  No recent change in his medications besides a slow taper of Zoloft over the last several weeks.  Patient is followed up by [name] from neurology, last visit in September 2019, dementia and Parkinson's disease were addressed, and it was recommended to continue carbidopa levodopa 25/102 tablets 3 times daily and donepezil 10 g daily.   ED Course: Patient was noted to have significant ambulatory dysfunction, neurology was consulted, recommendations to pursue work-up for ischemic CVA.  Review of Systems:  1. General: No fevers, no chills, no weight gain or weight loss 2. ENT: No runny nose or sore throat, no hearing disturbances 3. Pulmonary: No dyspnea, cough, wheezing, or hemoptysis 4. Cardiovascular: No angina, claudication, lower extremity edema, pnd or orthopnea 5. Gastrointestinal: No nausea or vomiting, no diarrhea or constipation 6. Hematology: No easy bruisability or frequent infections 7. Urology: No dysuria, hematuria or increased urinary frequency 8. Dermatology: No rashes. 9. Neurology: Amatory dysfunction, lack of coordination, history of present illness  10. Musculoskeletal: No joint pain or  deformities  Past Medical History:  Diagnosis Date  . Anxiety   . Aortic stenosis   . Arthritis    left hip  . Cancer ([place])    basal cell- facial?  . Depression   . Diabetes mellitus without complication ([place])   . GERD (gastroesophageal reflux disease)   . Headache(784.0)   . Heart murmur   . Hernia, inguinal, bilateral, recurrent   . Kidney stones   . Parkinson disease ([place])   . Parkinson's disease (tremor, stiffness, slow motion, unstable posture) ([place]) 12/11/2006  . PONV (postoperative nausea and vomiting)   . Restless leg syndrome   . S/P TAVR (transcatheter aortic valve replacement) 11/18/2013   26 mm Edwards Sapien XT transcatheter heart valve placed via open right transfemoral approach  . TIA (transient ischemic attack) 02/08/2007    Past Surgical History:  Procedure Laterality Date  . CARDIAC CATHETERIZATION  10/09/13  . cataract  2008   bilateral  . COLONOSCOPY    . CYSTOSCOPY/RETROGRADE/URETEROSCOPY  05/29/2011   Procedure: CYSTOSCOPY/RETROGRADE/URETEROSCOPY;  Surgeon: Malka So, MD;  Location: [facility];  Service: Urology;  Laterality: Left;  no stent   . EYE SURGERY Bilateral    /w IOL  . INGUINAL HERNIA REPAIR Bilateral 03/30/2014   Procedure: OPEN BILATERAL INGUINAL HERNIA REPAIR WITH MESH;  Surgeon: Coralie Keens, MD;  Location: [place];  Service: General;  Laterality: Bilateral;  . INSERTION OF MESH Bilateral 03/30/2014   Procedure: INSERTION OF MESH;  Surgeon: Coralie Keens, MD;  Location: [place];  Service: General;  Laterality: Bilateral;  . INTRAOPERATIVE TRANSESOPHAGEAL ECHOCARDIOGRAM N/A 11/18/2013   Procedure: INTRAOPERATIVE TRANSESOPHAGEAL ECHOCARDIOGRAM;  Surgeon: Sherren Mocha, MD;  Location: [place];  Service: Open Heart Surgery;  Laterality: N/A;  . LEFT AND RIGHT HEART CATHETERIZATION WITH CORONARY ANGIOGRAM N/A 10/09/2013   Procedure: LEFT AND RIGHT HEART CATHETERIZATION WITH CORONARY ANGIOGRAM;  Surgeon: Blane Ohara, MD;  Location: [facility];   Service: Cardiovascular;  Laterality: N/A;  . TRANSCATHETER AORTIC VALVE REPLACEMENT, TRANSFEMORAL N/A 11/18/2013   Procedure: TRANSCATHETER AORTIC VALVE REPLACEMENT, TRANSFEMORAL;  Surgeon: Sherren Mocha, MD;  Location: [place];  Service: Open Heart Surgery;  Laterality: N/A;     reports that he has never smoked. He has never used smokeless tobacco. He reports that he does not drink alcohol or use drugs.  Allergies  Allergen Reactions  . Penicillins     Unsure of reaction  . Vicodin [Hydrocodone-Acetaminophen] Other (See Comments)    "Mental reaction"    Family History  Problem Relation Age of Onset  . Arthritis/Rheumatoid Mother   . Prostate cancer Father   . Anemia Daughter   . Asthma Daughter   . Diabetes Daughter   . Hypertension Daughter   . Kidney disease Daughter   . Cancer Son   . Breast cancer Sister   . Heart attack Neg Hx   . Stroke Neg Hx      Prior to Admission medications   Medication Sig Start Date End Date Taking? Authorizing Provider  acetaminophen (TYLENOL) 500 MG tablet Take 1,000 mg by mouth 2 (two) times daily as needed (pain). For pain    [provider]  aspirin EC 81 MG tablet Take 1 tablet (81 mg total) by mouth daily. 12/19/17   Jerline Pain, MD  carbidopa-levodopa (SINEMET IR) 25-100 MG tablet Take 2 tablets by mouth 3 (three) times daily. 06/26/17   Tat, Eustace Quail, DO  cholecalciferol (VITAMIN D) 1000 UNITS tablet Take 1,000 Units by mouth at bedtime.     [provider]  clonazePAM (KLONOPIN) 0.5 MG tablet TAKE 1 TABLET (0.5 MG TOTAL) BY MOUTH AT BEDTIME. 03/19/18   Tat, Eustace Quail, DO  donepezil (ARICEPT) 10 MG tablet Take 1 tablet (10 mg total) by mouth at bedtime. 06/26/17   Tat, Eustace Quail, DO  IBUPROFEN PO Take by mouth as needed.    [provider]  MYRBETRIQ 50 MG TB24 tablet Take 50 mg by mouth daily. 08/27/17   [provider]  sertraline (ZOLOFT) 50 MG tablet Take 25 mg by mouth daily.     [provider]    Physical Exam: Vitals:   04/08/18 1200 04/08/18 1215 04/08/18 1300 04/08/18 1315  BP: 124/73 126/63 133/66 118/62  Pulse: (!) 58 65 (!) 58 (!) 56  Resp: (!) 21 15 14 14   Temp:      TempSrc:      SpO2: 100% 100% 100% 100%  Weight:      Height:        Vitals:   04/08/18 1200 04/08/18 1215 04/08/18 1300 04/08/18 1315  BP: 124/73 126/63 133/66 118/62  Pulse: (!) 58 65 (!) 58 (!) 56  Resp: (!) 21 15 14 14   Temp:      TempSrc:      SpO2: 100% 100% 100% 100%  Weight:      Height:       General: deconditioned  Neurology:  Eyes were closed, strength was preserved proximal and distal, upper and lower extremities, poor coordination, ambulatoin not assessed. Patient alert and orientated.  Head and Neck. Head normocephalic. Neck supple with no adenopathy or thyromegaly.   E ENT: mild pallor, no  icterus, oral mucosa dry. Cardiovascular: No JVD. S1-S2 present, rhythmic, no gallops, rubs, or murmurs. No lower extremity edema. Pulmonary: decreased breath sounds bilaterally, adequate air movement, no wheezing, rhonchi or rales. Gastrointestinal. Abdomen with no organomegaly, non tender, no rebound or guarding Skin. No rashes Musculoskeletal: no joint deformities    Labs on Admission: I have personally reviewed following labs and imaging studies  CBC: Recent Labs  Lab 04/08/18 1228  WBC 4.5  NEUTROABS 3.1  HGB 12.6*  HCT 40.4  MCV 91.4  PLT 75*   Basic Metabolic Panel: Recent Labs  Lab 04/08/18 1228  NA 143  K 4.0  CL 110  CO2 28  GLUCOSE 114*  BUN 16  CREATININE 1.04  CALCIUM 9.0   GFR: Estimated Creatinine Clearance: 50.5 mL/min (by C-G formula based on SCr of 1.04 mg/dL). Liver Function Tests: Recent Labs  Lab 04/08/18 1228  AST 15  ALT 14  ALKPHOS 76  BILITOT 0.6  PROT 6.0*  ALBUMIN 3.7   No results for input(s): LIPASE, AMYLASE in the last 168 hours. No results for input(s): AMMONIA in the last 168 hours. Coagulation Profile: No  results for input(s): INR, PROTIME in the last 168 hours. Cardiac Enzymes: Recent Labs  Lab 04/08/18 1228  TROPONINI <0.03   BNP (last 3 results) No results for input(s): PROBNP in the last 8760 hours. HbA1C: No results for input(s): HGBA1C in the last 72 hours. CBG: No results for input(s): GLUCAP in the last 168 hours. Lipid Profile: No results for input(s): CHOL, HDL, LDLCALC, TRIG, CHOLHDL, LDLDIRECT in the last 72 hours. Thyroid Function Tests: No results for input(s): TSH, T4TOTAL, FREET4, T3FREE, THYROIDAB in the last 72 hours. Anemia Panel: No results for input(s): VITAMINB12, FOLATE, FERRITIN, TIBC, IRON, RETICCTPCT in the last 72 hours. Urine analysis:    Component Value Date/Time   COLORURINE YELLOW 04/08/2018 1330   APPEARANCEUR CLEAR 04/08/2018 1330   LABSPEC 1.025 04/08/2018 1330   PHURINE 5.0 04/08/2018 1330   GLUCOSEU NEGATIVE 04/08/2018 1330   HGBUR NEGATIVE 04/08/2018 1330   BILIRUBINUR NEGATIVE 04/08/2018 1330   KETONESUR NEGATIVE 04/08/2018 1330   PROTEINUR NEGATIVE 04/08/2018 1330   UROBILINOGEN 1.0 11/17/2013 1038   NITRITE NEGATIVE 04/08/2018 1330   LEUKOCYTESUR NEGATIVE 04/08/2018 1330    Radiological Exams on Admission: Ct Head Wo Contrast  Result Date: 04/08/2018 CLINICAL DATA:  Altered mental status EXAM: CT HEAD WITHOUT CONTRAST TECHNIQUE: Contiguous axial images were obtained from the base of the skull through the vertex without intravenous contrast. COMPARISON:  11/09/2017 FINDINGS: Brain: Small remote lacunar infarct of the right external capsule. Otherwise, the brainstem, cerebellum, cerebral peduncles, thalami, basal ganglia, basilar cisterns, and ventricular system appear within normal limits. No intracranial hemorrhage, mass lesion, or acute CVA. Vascular: There is atherosclerotic calcification of the cavernous carotid arteries bilaterally. Skull: Unremarkable Sinuses/Orbits: Chronic right maxillary, bilateral ethmoid, left sphenoid, and left  frontal sinusitis. Other: Unremarkable IMPRESSION: 1. No acute intracranial findings. 2. Chronic paranasal sinusitis. 3. Small remote lacunar infarct of the right external capsule. Electronically Signed   By: Van Clines M.D.   On: 04/08/2018 12:51    EKG: Independently reviewed.  Sinus rhythm, left axis deviation, poor R wave progression.  Assessment/Plan Active Problems:   TIA (transient ischemic attack)  77 year old male who has history of Parkinson's disease and Parkinson's related dementia, presents with 24 hours of worsening rigidity, stiffness, ambulatory dysfunction and lack of coordination.  No frank confusion or agitation.  No recent change in his medications besides  a slowly taper off Zoloft over the last several weeks.  On the initial physical examination blood pressure 133/66, heart rate 58, respiratory rate 14, oxygen saturation 100%.  Dry mucous membranes, lungs clear to auscultation bilaterally, heart S1-S2 present and rhythmic, abdomen soft nontender, no lower extremity edema, strength is preserved, poor coordination.  No confusion or agitation.  Sodium 143, potassium 4.0, chloride 110, bicarb 28, glucose 114, BUN 16, creatinine 1.0, white count 4.5, hemoglobin 12.6, hematocrit 40.4, platelets 75.  Urine analysis negative for infection.  Head CT with no acute changes.  Abdominal x-ray with no signs of obstruction.   Patient is admitted to the hospital with a working diagnosis of acute neurologic deficit, rule out ischemic CVA/ worsening parkinsonism.  1.  Acute neurologic deficit, rule out ischemic CVA, TIA, rule out worsening parkinsonism.  Patient will be admitted to the medical ward, he will be placed a remote telemetry monitor, neuro checks every 4 hours, further work-up with carotid ultrasonography, echocardiography and brain MRI.  Check lipid profile.  Neurology has been consulted.  Physical therapy, speech therapy evaluation.  Aspirin for antiplatelet therapy and  atorvastatin for statin therapy.  2.  Parkinson's disease.  His symptoms suggest worsening parkinsonism, including rigidity, lack of coordination and worsening ambulatory dysfunction.  For now continue carbidopa levodopa, follow-up with neurology recommendations.  3.  Dementia with depression.  Continue donepezil, clonazepam, and sertraline taper.  4.  Overreactive bladder. Continue myrbetriq, no signs of urine infection.   DVT prophylaxis: enoxaparin  Code Status: full  Family Communication: I spoke with patient's daughter at the bedside and all questions were addressed.   Disposition Plan: telemetry  Consults called: neurology   Admission status: observation.     Grethel Zenk Gerome Apley MD [facility] Pager [phone]  If 7PM-7AM, please contact night-coverage [email or web address] Password [facility]  04/08/2018, 3:04 PM

## 2018-04-08 NOTE — Telephone Encounter (Signed)
Patient's daughter is calling in stating that he needs to be seen ASAP. She called and spoke with Dr.Aquino yesterday on call and that there was supposed to be some paperwork faxed over with his issues. He is getting worse. Please call her back at [phone] or let us know if we need to schedule a work in appt. Thanks!

## 2018-04-08 NOTE — Telephone Encounter (Signed)
Received note from after hours nursing:   "Caller states her father has Parkinson's. States he had a change today and was not able to find food to put in his mouth (hand/eye coordination issues). States he seems to be having trouble opening his eyes but is alert and able to communicate. Reports he did have a change in gait today and his steps are smaller. States he does not appear to be sick and he is able to eat."   Was instructed by Dr. Delice Lesch to call our office to get a sooner appt with you. How soon would you like to see patient?

## 2018-04-08 NOTE — ED Notes (Signed)
ED Provider at bedside. 

## 2018-04-08 NOTE — ED Provider Notes (Signed)
[facility] EMERGENCY DEPARTMENT Provider Note   CSN: [id number] Arrival date & time: 04/08/18  1033     History   Chief Complaint Chief Complaint  Patient presents with  . Fatigue    HPI [name]. is a 77 y.o. male with a PMH of Parkinson's Disease presenting with altered mental status and fatigue onset 11/24 at 10am. Patient reports he is unable to open both of his eyes. Patient describes eyes as heavy. Patient denies any acute complaints. Patient's daughter is the historian.  Patient's daughter states patient has been acting different since yesterday morning. Daughter states patient has been having difficulty walking when he normally is able to ambulate with a cane. Daughter reports patient has kept his eyes closed and appears to have diffuse weakness. Daughter reports patient is struggling to eat as well. Daughter states patient is confused at baseline, but states confusion has been worse over these last two days. Daughter denies any recent falls. Patient currently lives alone, but daughter was able to visit and stay with him the last 2 days. Patient is a poor historian due to his altered mental status.   Level 5 Caveat.   HPI  Past Medical History:  Diagnosis Date  . Anxiety   . Aortic stenosis   . Arthritis    left hip  . Cancer ([place])    basal cell- facial?  . Depression   . Diabetes mellitus without complication ([place])   . GERD (gastroesophageal reflux disease)   . Headache(784.0)   . Heart murmur   . Hernia, inguinal, bilateral, recurrent   . Kidney stones   . Parkinson disease ([place])   . Parkinson's disease (tremor, stiffness, slow motion, unstable posture) ([place]) 12/11/2006  . PONV (postoperative nausea and vomiting)   . Restless leg syndrome   . S/P TAVR (transcatheter aortic valve replacement) 11/18/2013   26 mm Edwards Sapien XT transcatheter heart valve placed via open right transfemoral approach  . TIA (transient ischemic attack)  02/08/2007    Patient Active Problem List   Diagnosis Date Noted  . TIA (transient ischemic attack) 04/08/2018  . Dementia due to Parkinson's disease without behavioral disturbance ([place]) 12/16/2015  . S/P TAVR (transcatheter aortic valve replacement) 11/18/2013  . Aortic valve disorder 11/18/2013  . Parkinson's disease ([place])   . Severe aortic stenosis 09/26/2013  . Idiopathic Parkinson's disease ([place]) 10/23/2011    Past Surgical History:  Procedure Laterality Date  . CARDIAC CATHETERIZATION  10/09/13  . cataract  2008   bilateral  . COLONOSCOPY    . CYSTOSCOPY/RETROGRADE/URETEROSCOPY  05/29/2011   Procedure: CYSTOSCOPY/RETROGRADE/URETEROSCOPY;  Surgeon: Malka So, MD;  Location: [facility];  Service: Urology;  Laterality: Left;  no stent   . EYE SURGERY Bilateral    /w IOL  . INGUINAL HERNIA REPAIR Bilateral 03/30/2014   Procedure: OPEN BILATERAL INGUINAL HERNIA REPAIR WITH MESH;  Surgeon: Coralie Keens, MD;  Location: [facility];  Service: General;  Laterality: Bilateral;  . INSERTION OF MESH Bilateral 03/30/2014   Procedure: INSERTION OF MESH;  Surgeon: Coralie Keens, MD;  Location: [facility];  Service: General;  Laterality: Bilateral;  . INTRAOPERATIVE TRANSESOPHAGEAL ECHOCARDIOGRAM N/A 11/18/2013   Procedure: INTRAOPERATIVE TRANSESOPHAGEAL ECHOCARDIOGRAM;  Surgeon: Sherren Mocha, MD;  Location: [facility] OR;  Service: Open Heart Surgery;  Laterality: N/A;  . LEFT AND RIGHT HEART CATHETERIZATION WITH CORONARY ANGIOGRAM N/A 10/09/2013   Procedure: LEFT AND RIGHT HEART CATHETERIZATION WITH CORONARY ANGIOGRAM;  Surgeon: Blane Ohara, MD;  Location: [facility]  CATH LAB;  Service: Cardiovascular;  Laterality: N/A;  . TRANSCATHETER AORTIC VALVE REPLACEMENT, TRANSFEMORAL N/A 11/18/2013   Procedure: TRANSCATHETER AORTIC VALVE REPLACEMENT, TRANSFEMORAL;  Surgeon: Sherren Mocha, MD;  Location: [facility];  Service: Open Heart Surgery;  Laterality: N/A;        Home Medications    Prior to Admission  medications   Medication Sig Start Date End Date Taking? Authorizing Provider  acetaminophen (TYLENOL) 500 MG tablet Take 1,000 mg by mouth 2 (two) times daily as needed (pain). For pain    [provider]  aspirin EC 81 MG tablet Take 1 tablet (81 mg total) by mouth daily. 12/19/17   Jerline Pain, MD  carbidopa-levodopa (SINEMET IR) 25-100 MG tablet Take 2 tablets by mouth 3 (three) times daily. 06/26/17   Tat, Eustace Quail, DO  cholecalciferol (VITAMIN D) 1000 UNITS tablet Take 1,000 Units by mouth at bedtime.     [provider]  clonazePAM (KLONOPIN) 0.5 MG tablet TAKE 1 TABLET (0.5 MG TOTAL) BY MOUTH AT BEDTIME. 03/19/18   Tat, Eustace Quail, DO  donepezil (ARICEPT) 10 MG tablet Take 1 tablet (10 mg total) by mouth at bedtime. 06/26/17   Tat, Eustace Quail, DO  IBUPROFEN PO Take by mouth as needed.    [provider]  MYRBETRIQ 50 MG TB24 tablet Take 50 mg by mouth daily. 08/27/17   [provider]  sertraline (ZOLOFT) 50 MG tablet Take 25 mg by mouth daily.     [provider]    Family History Family History  Problem Relation Age of Onset  . Arthritis/Rheumatoid Mother   . Prostate cancer Father   . Anemia Daughter   . Asthma Daughter   . Diabetes Daughter   . Hypertension Daughter   . Kidney disease Daughter   . Cancer Son   . Breast cancer Sister   . Heart attack Neg Hx   . Stroke Neg Hx     Social History Social History   Tobacco Use  . Smoking status: Never Smoker  . Smokeless tobacco: Never Used  Substance Use Topics  . Alcohol use: No  . Drug use: No     Allergies   Penicillins and Vicodin [hydrocodone-acetaminophen]   Review of Systems Review of Systems  Unable to perform ROS: Mental status change     Physical Exam Updated Vital Signs BP 118/62   Pulse (!) 56   Temp 97.8 F (36.6 C) (Oral)   Resp 14   Ht 5\' 10"  (1.778 m)   Wt 60 kg   SpO2 100%   BMI 18.98 kg/m   Physical Exam  Constitutional: He appears  well-developed and well-nourished. No distress.  Patient maintained eyes closed throughout the exam. Patient is unable to fully open eyes to command.   HENT:  Head: Normocephalic and atraumatic.  Eyes: Left eye exhibits discharge. Right conjunctiva is not injected. Right conjunctiva has no hemorrhage. Left conjunctiva is injected. Left conjunctiva has no hemorrhage.  Neck: Normal range of motion.  Cardiovascular: Normal rate, regular rhythm and normal heart sounds. Exam reveals no gallop and no friction rub.  No murmur heard. Pulmonary/Chest: Effort normal and breath sounds normal. No respiratory distress. He has no wheezes. He has no rales.  Abdominal: Soft. He exhibits no distension. There is no tenderness. There is no guarding.  Musculoskeletal: Normal range of motion.  Neurological: He is alert.  Skin: No rash noted. He is not diaphoretic. No erythema.  Psychiatric: He has a normal mood  and affect.  Nursing note and vitals reviewed.   Mental Status:  Alert, oriented, thought content appropriate, able to give a coherent history. Speech fluent without evidence of aphasia. Able to follow 2 step commands without difficulty.  Cranial Nerves:  II: Patient is unable to open eyes on his own. Pupils equal, round, reactive to light III,IV, VI: Patient is unable to maintain eyes open. V,VII: smile asymmetric with partial facial droop on the left. Patient is unable to raise left eyebrow. Facial light touch sensation equal VIII: hearing grossly normal to voice  X: uvula elevates symmetrically  XI: bilateral shoulder shrug symmetric and strong XII: midline tongue extension without fassiculations Motor:  Normal tone. 5/5 in upper and lower extremities bilaterally including strong and equal grip strength and dorsiflexion/plantar flexion Sensory:  light touch normal in all extremities.  Cerebellar: patient is not able to follow some commands and perform finger-to-nose with bilateral upper  extremities Gait: patient is not able to ambulate due to weakness.  CV: distal pulses palpable throughout    ED Treatments / Results  Labs (all labs ordered are listed, but only abnormal results are displayed) Labs Reviewed  CBC WITH DIFFERENTIAL/PLATELET - Abnormal; Notable for the following components:      Result Value   Hemoglobin 12.6 (*)    Platelets 75 (*)    All other components within normal limits  COMPREHENSIVE METABOLIC PANEL - Abnormal; Notable for the following components:   Glucose, Bld 114 (*)    Total Protein 6.0 (*)    All other components within normal limits  URINALYSIS, ROUTINE W REFLEX MICROSCOPIC  TROPONIN I    EKG EKG Interpretation  Date/Time:  Monday April 08 2018 10:54:11 EST Ventricular Rate:  69 PR Interval:    QRS Duration: 101 QT Interval:  391 QTC Calculation: 419 R Axis:   4 Text Interpretation:  Sinus rhythm Low voltage, extremity leads Anterior injury pattern Artifact T wave abnormality Abnormal ekg Confirmed by Carmin Muskrat [phone]) on 04/08/2018 11:07:50 AM   Radiology Ct Head Wo Contrast  Result Date: 04/08/2018 CLINICAL DATA:  Altered mental status EXAM: CT HEAD WITHOUT CONTRAST TECHNIQUE: Contiguous axial images were obtained from the base of the skull through the vertex without intravenous contrast. COMPARISON:  11/09/2017 FINDINGS: Brain: Small remote lacunar infarct of the right external capsule. Otherwise, the brainstem, cerebellum, cerebral peduncles, thalami, basal ganglia, basilar cisterns, and ventricular system appear within normal limits. No intracranial hemorrhage, mass lesion, or acute CVA. Vascular: There is atherosclerotic calcification of the cavernous carotid arteries bilaterally. Skull: Unremarkable Sinuses/Orbits: Chronic right maxillary, bilateral ethmoid, left sphenoid, and left frontal sinusitis. Other: Unremarkable IMPRESSION: 1. No acute intracranial findings. 2. Chronic paranasal sinusitis. 3. Small remote  lacunar infarct of the right external capsule. Electronically Signed   By: Van Clines M.D.   On: 04/08/2018 12:51    Procedures Procedures (including critical care time)  Medications Ordered in ED Medications  carbidopa-levodopa (SINEMET IR) 25-100 MG per tablet immediate release 2 tablet (has no administration in time range)  carbidopa-levodopa (SINEMET IR) 25-100 MG per tablet immediate release 1 tablet (has no administration in time range)  fluorescein ophthalmic strip 1 strip (1 strip Left Eye Given 04/08/18 1227)  tetracaine (PONTOCAINE) 0.5 % ophthalmic solution 2 drop (2 drops Left Eye Given 04/08/18 1227)     Initial Impression / Assessment and Plan / ED Course  I have reviewed the triage vital signs and the nursing notes.  Pertinent labs & imaging results that were available  during my care of the patient were reviewed by me and considered in my medical decision making (see chart for details).  Clinical Course as of Apr 08 1521  Mon Apr 08, 2018  1235 Fluorescein stain reveals no uptake. Do not suspect a corneal abrasion at this time.    [AH]  1257 No acute intracranial findings. Chronic paranasal sinusitis and small remote lacunar infarct of the right external capsule noted on head Ct.     CT Head Wo Contrast [AH]  1358 UA is unremarkable. Troponin is negative. CMP is within normal limits with the exception of hyperglycemia at 114 and low total protein at 6.   [AH]  1401 Hemoglobin is low at 12.6, but this appears patient's baseline. Last hemoglobin in 11/2017 was 12.2.   Hemoglobin(!): 12.6 [AH]  1419 Spoke to neurology and they recommend an MRI at this point. Neurology recommends an abdominal x ray since they believe constipation may be causing symptoms.    [AH]    Clinical Course User Index [AH] Arville Lime, PA-C   Hemoglobin  Date Value Ref Range Status  04/08/2018 12.6 (L) 13.0 - 17.0 g/dL Final  11/16/2017 12.2 (L) 13.2 - 17.1 g/dL Final    11/09/2017 11.5 (L) 13.0 - 17.0 g/dL Final  05/03/2017 12.9 (L) 13.2 - 17.1 g/dL Final   Suspect symptoms are likely due to eyelid apraxia based on neurology's assessment. Ordered head CT and it reveals no acute intracranial findings and small remote lacunar infarct of the right external capsule. UA is unremarkable. CBC and CMP do not reveal signs of infection or electrolyte abnormalities that may be causing symptoms. Performed fluorescein stain due to left eye erythema and no uptake was present. Suspect left eye erythema is likely due to conjunctivitis. Neurology was consulted and agreed to evaluate patient. Ordered abdominal x ray to evaluate for constipation as recommended by neurology. Abdominal x ray is pending  Neurology recommends an MRI of head and admission for observation due to focal neuro deficits. MRI is pending. Patient may benefit from a case management consult regarding deteriorating condition. Consulted hospitalist and hospitalists has agreed to admit patient.   Findings and plan of care discussed with supervising physician Dr. Vanita Panda who personally evaluated and examined this patient.  Final Clinical Impressions(s) / ED Diagnoses   Final diagnoses:  Generalized weakness    ED Discharge Orders    None       Arville Lime, [place] 04/08/18 1522    Carmin Muskrat, MD 04/08/18 1544

## 2018-04-08 NOTE — ED Notes (Signed)
Called pharm to obtain sinemet IR from the pharmacy

## 2018-04-08 NOTE — Consult Note (Addendum)
Neurology Consultation  Reason for Consult: Patient is having difficulty opening his eyes and per daughter he is having periods of being frozen Referring Physician: Kelli Hope   History is obtained from: Daughter  HPI: [name]. is a 77 y.o. male TIA, status post TAVR, restless leg, Parkinson's disease with tremor stiffness bradykinesia and sees Dr. Carles Collet, kidney stones, heart murmur, aortic stenosis, diabetes.  Patient apparently per daughter over the last 48 hours has become significantly frozen with his gait and having difficulty opening his eyes.  However when I talked to him although he is slow in his answer he states it has been gradually coming on.  I did call Dr. Carles Collet and he has had an episode like this before in which she had eyelid opening apraxia, she stated at that time he was abdominally bound up with fecal matter.  Apparently after they solve this issue he got better.  In the discussion she states that the only thing she knows at this point that would possibly help would be Botox.  She is going to make an appointment for him so that she can see him as soon as possible.  Vision is on Sinemet 25-100 2 tablets 3 times a day   ED course CT head    ROS:  ROS was performed and is negative except as noted in the HPI.  Past Medical History:  Diagnosis Date  . Anxiety   . Aortic stenosis   . Arthritis    left hip  . Cancer ([place])    basal cell- facial?  . Depression   . Diabetes mellitus without complication ([place])   . GERD (gastroesophageal reflux disease)   . Headache(784.0)   . Heart murmur   . Hernia, inguinal, bilateral, recurrent   . Kidney stones   . Parkinson disease ([place])   . Parkinson's disease (tremor, stiffness, slow motion, unstable posture) ([place]) 12/11/2006  . PONV (postoperative nausea and vomiting)   . Restless leg syndrome   . S/P TAVR (transcatheter aortic valve replacement) 11/18/2013   26 mm Edwards Sapien XT transcatheter heart valve placed via open  right transfemoral approach  . TIA (transient ischemic attack) 02/08/2007    Family History  Problem Relation Age of Onset  . Arthritis/Rheumatoid Mother   . Prostate cancer Father   . Anemia Daughter   . Asthma Daughter   . Diabetes Daughter   . Hypertension Daughter   . Kidney disease Daughter   . Cancer Son   . Breast cancer Sister   . Heart attack Neg Hx   . Stroke Neg Hx    Social History:   reports that he has never smoked. He has never used smokeless tobacco. He reports that he does not drink alcohol or use drugs.  Medications No current facility-administered medications for this encounter.   Current Outpatient Medications:  .  acetaminophen (TYLENOL) 500 MG tablet, Take 1,000 mg by mouth 2 (two) times daily as needed (pain). For pain, Disp: , Rfl:  .  aspirin EC 81 MG tablet, Take 1 tablet (81 mg total) by mouth daily., Disp: , Rfl:  .  carbidopa-levodopa (SINEMET IR) 25-100 MG tablet, Take 2 tablets by mouth 3 (three) times daily., Disp: 540 tablet, Rfl: 1 .  cholecalciferol (VITAMIN D) 1000 UNITS tablet, Take 1,000 Units by mouth at bedtime. , Disp: , Rfl:  .  clonazePAM (KLONOPIN) 0.5 MG tablet, TAKE 1 TABLET (0.5 MG TOTAL) BY MOUTH AT BEDTIME., Disp: 90 tablet, Rfl: 1 .  donepezil (ARICEPT) 10 MG tablet, Take 1 tablet (10 mg total) by mouth at bedtime., Disp: 90 tablet, Rfl: 1 .  IBUPROFEN PO, Take by mouth as needed., Disp: , Rfl:  .  MYRBETRIQ 50 MG TB24 tablet, Take 50 mg by mouth daily., Disp: , Rfl: 11 .  sertraline (ZOLOFT) 50 MG tablet, Take 25 mg by mouth daily. , Disp: , Rfl:    Exam: Current vital signs: BP 118/62   Pulse (!) 56   Temp 97.8 F (36.6 C) (Oral)   Resp 14   Ht 5\' 10"  (1.778 m)   Wt 60 kg   SpO2 100%   BMI 18.98 kg/m  Vital signs in last 24 hours: Temp:  [97.8 F (36.6 C)] 97.8 F (36.6 C) (11/25 1045) Pulse Rate:  [56-69] 56 (11/25 1315) Resp:  [13-25] 14 (11/25 1315) BP: (116-133)/(54-73) 118/62 (11/25 1315) SpO2:  [100 %] 100  % (11/25 1315) Weight:  [60 kg] 60 kg (11/25 1044)  Physical Exam  Constitutional: Appears well-developed.  Psych: Affect appropriate to situation Eyes: No scleral injection HENT: No OP obstrucion Head: Normocephalic.  Cardiovascular: Normal rate and regular rhythm.  Respiratory: Effort normal, non-labored breathing GI: Soft.  No distension. There is no tenderness.  Skin: WDI  Neuro: Mental Status: Patient appears extremely lethargic.  Voice is hypophonic.  Is able to answer questions but takes some time to think about the answer.  Able to follow commands but is bradykinetic. Cranial Nerves: II: Visual Fields are full.  III,IV, VI: EOMI able to open eyelids however if not continue stimulated will close them. Pupils are equal, round, and reactive to light.   V: Facial sensation is symmetric to temperature VII: Slight right facial droop however other than that face symmetric.  Does have hypomania VIII: hearing is intact to voice XI: Shoulder shrug is symmetric. XII: tongue is midline without atrophy or fasciculations.  Motor: Tone is normal. Bulk is normal. 5/5 strength was present in all four extremities.  Very bradykinetic Sensory: Sensation is symmetric to light touch and temperature in the arms and legs. Deep Tendon Reflexes: 3+ and symmetric in the biceps and patellae.  Plantars: Toes are downgoing bilaterally.  Cerebellar: FNF  Labs I have reviewed labs in epic and the results pertinent to this consultation are:   CBC    Component Value Date/Time   WBC 4.5 04/08/2018 1228   RBC 4.42 04/08/2018 1228   HGB 12.6 (L) 04/08/2018 1228   HCT 40.4 04/08/2018 1228   PLT 75 (L) 04/08/2018 1228   MCV 91.4 04/08/2018 1228   MCH 28.5 04/08/2018 1228   MCHC 31.2 04/08/2018 1228   RDW 12.8 04/08/2018 1228   LYMPHSABS 0.8 04/08/2018 1228   MONOABS 0.3 04/08/2018 1228   EOSABS 0.2 04/08/2018 1228   BASOSABS 0.0 04/08/2018 1228    CMP     Component Value Date/Time   NA  143 04/08/2018 1228   K 4.0 04/08/2018 1228   CL 110 04/08/2018 1228   CO2 28 04/08/2018 1228   GLUCOSE 114 (H) 04/08/2018 1228   BUN 16 04/08/2018 1228   CREATININE 1.04 04/08/2018 1228   CREATININE 1.02 11/16/2017 1653   CALCIUM 9.0 04/08/2018 1228   PROT 6.0 (L) 04/08/2018 1228   ALBUMIN 3.7 04/08/2018 1228   AST 15 04/08/2018 1228   ALT 14 04/08/2018 1228   ALKPHOS 76 04/08/2018 1228   BILITOT 0.6 04/08/2018 1228   GFRNONAA >60 04/08/2018 1228   GFRAA >60 04/08/2018 1228  Lipid Panel     Component Value Date/Time   CHOL  02/09/2007 0409    130        ATP III CLASSIFICATION:  <200     mg/dL   Desirable  200-239  mg/dL   Borderline High  >=240    mg/dL   High   TRIG 66 02/09/2007 0409   HDL 32 (L) 02/09/2007 0409   CHOLHDL 4.1 02/09/2007 0409   VLDL 13 02/09/2007 0409   LDLCALC  02/09/2007 0409    85        Total Cholesterol/HDL:CHD Risk Coronary Heart Disease Risk Table                     Men   Women  1/2 Average Risk   3.4   3.3     Imaging I have reviewed the images obtained:  CT-scan of the brain-no acute intracranial abnormalities, small remote lacunar infarct on the right external capsule   Etta Quill PA-C [facility] [phone]  M-F  (9:00 am- 5:00 PM)  04/08/2018, 2:19 PM     Assessment:  77 year old male with known Parkinson's disease presented to the hospital with a 48-hour to 24-hour period of worsening of bradykinesia, freezing, eye-opening apraxia and possible new left facial droop.  At this point, my main concern is that his daughter is unable to care for him at home.  With questionable left facial weakness, it is possible that he has a parietal stroke causing eyelid apraxia, and this will need to be evaluated with an MRI.   Recommendations: -MRI of brain  -PT/OT -Restart home Sinemet -Assess for infectious causes of neurocognitive worsening -Stroke work-up only if positive  Roland Rack, MD [facility] [phone]  If 7pm- 7am, please page neurology on call as listed in [place].

## 2018-04-08 NOTE — Telephone Encounter (Signed)
Ok thanks 

## 2018-04-08 NOTE — ED Notes (Signed)
Attempted to do [facility]. Pt mutters to dtr, but is not willing to participate in things I am asking him to do. He is shedding tears.

## 2018-04-09 ENCOUNTER — Observation Stay (HOSPITAL_BASED_OUTPATIENT_CLINIC_OR_DEPARTMENT_OTHER): Payer: Medicare Other

## 2018-04-09 ENCOUNTER — Observation Stay (HOSPITAL_COMMUNITY): Payer: Medicare Other

## 2018-04-09 ENCOUNTER — Other Ambulatory Visit: Payer: Self-pay

## 2018-04-09 DIAGNOSIS — R531 Weakness: Secondary | ICD-10-CM

## 2018-04-09 DIAGNOSIS — G459 Transient cerebral ischemic attack, unspecified: Secondary | ICD-10-CM | POA: Diagnosis not present

## 2018-04-09 DIAGNOSIS — I361 Nonrheumatic tricuspid (valve) insufficiency: Secondary | ICD-10-CM | POA: Diagnosis not present

## 2018-04-09 LAB — HEMOGLOBIN A1C
Hgb A1c MFr Bld: 5.4 % (ref 4.8–5.6)
Mean Plasma Glucose: 108.28 mg/dL

## 2018-04-09 LAB — ECHOCARDIOGRAM COMPLETE
HEIGHTINCHES: 66 in
Weight: 2077.62 oz

## 2018-04-09 LAB — LIPID PANEL
CHOLESTEROL: 104 mg/dL (ref 0–200)
HDL: 40 mg/dL — ABNORMAL LOW (ref >40)
LDL Cholesterol: 59 mg/dL (ref 0–99)
Total CHOL/HDL Ratio: 2.6 RATIO
Triglycerides: 23 mg/dL (ref <150)
VLDL: 5 mg/dL (ref 0–40)

## 2018-04-09 MED ORDER — CARBIDOPA-LEVODOPA 25-100 MG PO TABS: 2.5000 | ORAL_TABLET | Freq: Three times a day (TID) | ORAL | Status: DC

## 2018-04-09 MED ORDER — CARBIDOPA-LEVODOPA 25-100 MG PO TABS
2.0000 | ORAL_TABLET | Freq: Four times a day (QID) | ORAL | Status: DC
  Administered 2018-04-09 – 2018-04-15 (×25): 2 via ORAL
  Filled 2018-04-09 (×24): qty 2

## 2018-04-09 MED ORDER — MILK AND MOLASSES ENEMA
1.0000 | Freq: Once | RECTAL | Status: AC
  Administered 2018-04-09: 250 mL via RECTAL
  Filled 2018-04-09: qty 250

## 2018-04-09 NOTE — Progress Notes (Signed)
  Echocardiogram 2D Echocardiogram has been performed.  [name] 04/09/2018, 11:30 AM

## 2018-04-09 NOTE — Evaluation (Signed)
Speech Language Pathology Evaluation Patient Details Name: [name]. MRN: [id number] DOB: Feb 04, 1941 Today's Date: 04/09/2018 Time: 3664-4034 SLP Time Calculation (min) (ACUTE ONLY): 32 min  Problem List:  Patient Active Problem List   Diagnosis Date Noted  . TIA (transient ischemic attack) 04/08/2018  . 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 S/P TAVR (transcatheter aortic valve replacement) 11/18/2013  . Aortic valve disorder 11/18/2013  . Parkinson's disease ([place])   . Severe aortic stenosis 09/26/2013  . Idiopathic Parkinson's disease ([place]) 10/23/2011   Past Medical History:  Past Medical History:  Diagnosis Date  . Anxiety   . Aortic stenosis   . Arthritis    left hip  . Cancer ([place])    basal cell- facial?  . Depression   . Diabetes mellitus without complication ([place])   . GERD (gastroesophageal reflux disease)   . Headache(784.0)   . Heart murmur   . Hernia, inguinal, bilateral, recurrent   . Kidney stones   . Parkinson disease ([place])   . Parkinson's disease (tremor, stiffness, slow motion, unstable posture) ([place]) 12/11/2006  . PONV (postoperative nausea and vomiting)   . Restless leg syndrome   . S/P TAVR (transcatheter aortic valve replacement) 11/18/2013   26 mm Edwards Sapien XT transcatheter heart valve placed via open right transfemoral approach  . TIA (transient ischemic attack) 02/08/2007   Past Surgical History:  Past Surgical History:  Procedure Laterality Date  . CARDIAC CATHETERIZATION  10/09/13  . cataract  2008   bilateral  . COLONOSCOPY    . CYSTOSCOPY/RETROGRADE/URETEROSCOPY  05/29/2011   Procedure: CYSTOSCOPY/RETROGRADE/URETEROSCOPY;  Surgeon: Malka So, MD;  Location: [facility];  Service: Urology;  Laterality: Left;  no stent   . EYE SURGERY Bilateral    /w IOL  . INGUINAL HERNIA REPAIR Bilateral 03/30/2014   Procedure: OPEN BILATERAL INGUINAL HERNIA REPAIR WITH MESH;  Surgeon: Coralie Keens, MD;  Location: [place];  Service: General;  Laterality: Bilateral;  . INSERTION OF MESH Bilateral 03/30/2014   Procedure: INSERTION OF MESH;  Surgeon: Coralie Keens, MD;  Location: [place];  Service: General;  Laterality: Bilateral;  . INTRAOPERATIVE TRANSESOPHAGEAL ECHOCARDIOGRAM N/A 11/18/2013   Procedure: INTRAOPERATIVE TRANSESOPHAGEAL ECHOCARDIOGRAM;  Surgeon: Sherren Mocha, MD;  Location: [facility] OR;  Service: Open Heart Surgery;  Laterality: N/A;  . LEFT AND RIGHT HEART CATHETERIZATION WITH CORONARY ANGIOGRAM N/A 10/09/2013   Procedure: LEFT AND RIGHT HEART CATHETERIZATION WITH CORONARY ANGIOGRAM;  Surgeon: Blane Ohara, MD;  Location: [facility] C[facility] LAB;  Service: Cardiovascular;  Laterality: N/A;  . TRANSCATHETER AORTIC VALVE REPLACEMENT, TRANSFEMORAL N/A 11/18/2013   Procedure: TRANSCATHETER AORTIC VALVE REPLACEMENT, TRANSFEMORAL;  Surgeon: Sherren Mocha, MD;  Location: [place];  Service: Open Heart Surgery;  Laterality: N/A;   HPI:  77 y.o. male with medical history significant of Parkinson's disease, depression, history of TIA, history of aortic stenosis status post TAVR and type 2 diabetes mellitus.  Patient has been at his usual state of health until 24 hours ago when he was noticed to be more stiff, difficulty ambulating and coordinating his extremities, he kept his eyes closed, no frank confusion or agitation.  He is able to live independently but for the last 24 hours her daughter to be with him to prevent falls to guide him. MRI head 03/29/18 indicated No acute finding. 2. Cerebral atrophy, central predominant, that has progressed from 2008.  Assessment / Plan / Recommendation Clinical Impression   Pt administered portion of the [facility]  Cognitive Assessment) and this yielded a score of 6/25 with baseline mild deficits noted by family in the areas of memory and orientation; pt experienced difficulty with areas of memory, attention, language, naming and orientation; daughter feels  this assessment is not a "true picture" of his abilities d/t situational delirium/confusion; pt was agitated during the night, so this paired with baseline level of function and lack of sleep/agitation, this assessment may not indicative of true ability; decreased intelligibility noted d/t decreased intensity during speaking tasks with <50% accuracy noted with phrase-conversation level with repetitions required and mod verbal cueing to increase overall intelligibility. Pt has been followed by ST in the past for voice deficits; family/pt denied dysphagia; ST will follow acutely for cognitive needs/speech intelligibility and reiteration of compensatory strategies for increased volume.  Thank you for this consult.    SLP Assessment  SLP Recommendation/Assessment: Patient needs continued Speech Language Pathology Services SLP Visit Diagnosis: Dysarthria and anarthria (R47.1);Cognitive communication deficit (R41.841)    Follow Up Recommendations  None    Frequency and Duration min 1 x/week  1 week      SLP Evaluation Cognition  Overall Cognitive Status: Impaired/Different from baseline Arousal/Alertness: Lethargic Orientation Level: Oriented to person;Oriented to place;Disoriented to time;Disoriented to situation Attention: Sustained Sustained Attention: Impaired Sustained Attention Impairment: Verbal basic;Functional basic(kept eyes closed during assessment) Memory: Impaired Memory Impairment: Retrieval deficit;Decreased recall of new information;Decreased short term memory Decreased Short Term Memory: Verbal basic;Functional basic Awareness: Appears intact Problem Solving: Impaired Problem Solving Impairment: Verbal basic;Functional basic Executive Function: Organizing;Decision Conservation officer, historic buildings: Impaired Organizing Impairment: Verbal basic;Functional basic Decision Making: Impaired Decision Making Impairment: Verbal basic;Functional basic Behaviors: Perseveration Safety/Judgment: Other  (comment)(DTA)       Comprehension  Auditory Comprehension Overall Auditory Comprehension: Impaired at baseline Yes/No Questions: Within Functional Limits Commands: Impaired Conversation: Simple Other Conversation Comments: (difficult to assess d/t decreased intelligibility) Interfering Components: Processing speed EffectiveTechniques: Extra processing time;Repetition Visual Recognition/Discrimination Discrimination: Not tested Reading Comprehension Reading Status: Unable to assess (comment)    Expression Expression Primary Mode of Expression: Verbal Verbal Expression Overall Verbal Expression: Impaired Level of Generative/Spontaneous Verbalization: Sentence;Conversation Repetition: Impaired Level of Impairment: Sentence level Naming: Impairment Responsive: 51-75% accurate Confrontation: Impaired Convergent: 25-49% accurate Divergent: Not tested Verbal Errors: Perseveration Interfering Components: Speech intelligibility Non-Verbal Means of Communication: Not applicable Written Expression Dominant Hand: Right Written Expression: Not tested   Oral / Motor  Oral Motor/Sensory Function Overall Oral Motor/Sensory Function: Generalized oral weakness Facial Symmetry: Within Functional Limits Motor Speech Overall Motor Speech: Appears within functional limits for tasks assessed Respiration: Within functional limits Phonation: Normal Resonance: Within functional limits Articulation: Impaired Level of Impairment: Phrase Intelligibility: Intelligibility reduced Word: 50-74% accurate Phrase: 25-49% accurate Sentence: 25-49% accurate Conversation: 25-49% accurate Motor Planning: Witnin functional limits Motor Speech Errors: Not applicable Interfering Components: Premorbid status Effective Techniques: Increased vocal intensity                       Elvina Sidle, M.S., CCC-SLP 04/09/2018, 2:13 PM

## 2018-04-09 NOTE — Progress Notes (Signed)
Rehab Admissions Coordinator Note:  Patient was screened by Cleatrice Burke for appropriateness for an Inpatient Acute Rehab Consult per OT recommendation.  At this time, we are recommending Inpatient Rehab consult. Please place an order for an inpt rehab consult if pt would like to be considered for admit.  Danne Baxter, RN, MSN Rehab Admissions Coordinator [phone] 04/09/2018 10:02 AM

## 2018-04-09 NOTE — Progress Notes (Signed)
Will try at another time and have spoken with OT about findings relevant to PT evaluation.  Pt in a vascular procedure.   04/09/18 0900  PT Visit Information  Last PT Received On 04/09/18  Reason Eval/Treat Not Completed Patient at procedure or test/unavailable    Mee Hives, PT MS Acute Rehab Dept. Number: [place] [facility] [place]

## 2018-04-09 NOTE — Progress Notes (Signed)
Subjective: No significant changes,   Exam: Vitals:   04/09/18 0757 04/09/18 1130  BP: (!) 115/58 (!) 114/58  Pulse: (!) 59 (!) 54  Resp: 20 19  Temp:  (!) 97.4 F (36.3 C)  SpO2: 99% 99%   Gen: In bed, NAD Resp: non-labored breathing, no acute distress Abd: soft, nt  Neuro: MS: somnolent, but rousable, still with eyelid apraxia. Follows commands and answers simple questions, tells me "11th month" RN:HAFBX eyes closed, but once opened, pupils reactive.  Motor: good strength, but increased tone.  Sensory:intact to LT  Abd XR: stoll retention.   Impression: 77 year old male with known Parkinson's disease presented to the hospital with a 48-hour to 24-hour period of worsening of bradykinesia, freezing, eye-opening apraxia. Without other clear cause for worsening of his underlying neurodegenerative condition, I do think that constipation needs to be addressed. He may need placement if unable to meet care needs at home.   Recommendations: 1) Continue sinemet, increase to 2 tabs QID 2) will defer constipation treatment to IM 3) may need placement, at least for the short/intermediate term.   Roland Rack, MD [facility] [phone]  If 7pm- 7am, please page neurology on call as listed in [place].

## 2018-04-09 NOTE — Progress Notes (Signed)
Occupational Therapy Evaluation Patient Details Name: [name]. MRN: [id number] DOB: [birth year] Today's Date: 04/09/2018    History of Present Illness 77 y.o. male TIA, status post TAVR, restless leg, Parkinson's disease with tremor stiffness bradykinesia (follows with Dr. Carles Collet)  kidney stones, heart murmur, aortic stenosis, diabetes.[facility] [facility] CVA work up underway. MRI - for CVA.    Clinical Impression   PTA, pt lived at home and was modified independent with ADL and mobility using a cane. Family checked in intermittently during the day and assisted as needed with IADL tasks. Pt demonstrates a significant functional decline requiring Max A with mobility and ADL and is a high fall risk. Given pt's PLOF, feel pt is appropriate for rehab at [facility] but will most likely need assistance after DC. Will follow acutely to facilitate DC to next venue of care.     Follow Up Recommendations  CIR;Supervision/Assistance - 24 hour    Equipment Recommendations  Tub/shower seat    Recommendations for Other Services Rehab consult;Other (comment)     Precautions / Restrictions Precautions Precautions: Fall Restrictions Weight Bearing Restrictions: No      Mobility Bed Mobility Overal bed mobility: Needs Assistance Bed Mobility: Supine to Sit;Sit to Supine     Supine to sit: Mod assist Sit to supine: Max assist      Transfers Overall transfer level: Needs assistance Equipment used: 1 person hand held assist Transfers: Sit to/from [facility] Sit to Stand: Mod assist Stand pivot transfers: Max assist            Balance Overall balance assessment: Needs assistance Sitting-balance support: Feet supported Sitting balance-Leahy Scale: Poor       Standing balance-Leahy Scale: Zero Standing balance comment: posterior lean                           ADL either performed or assessed with clinical judgement   ADL Overall ADL's : Needs  assistance/impaired Eating/Feeding: Maximal assistance Eating/Feeding Details (indicate cue type and reason): daughter feeding pt; appesr having difficulty keeping eyes open; bringing ofood to mouth that is not there Grooming: Maximal assistance   Upper Body Bathing: Maximal assistance;Sitting   Lower Body Bathing: Maximal assistance;Sit to/from stand   Upper Body Dressing : Maximal assistance;Sitting   Lower Body Dressing: Maximal assistance;Sit to/from stand Lower Body Dressing Details (indicate cue type and reason): pt able to lean forward to touch socks and begin pulling socks up; appears to perseverate on task Toilet Transfer: Maximal assistance Toilet Transfer Details (indicate cue type and reason): simulated Toileting- Clothing Manipulation and Hygiene: Maximal assistance       Functional mobility during ADLs: Maximal assistance;+2 for safety/equipment       Vision Baseline Vision/History: Wears glasses Wears Glasses: At all times Additional Comments: unable to keep eyes open to fully assess     Perception Perception Comments: ? visual hallucinations   Praxis Praxis Praxis tested?: Deficits Deficits: Initiation;Perseveration;Motor Impersistence    Pertinent Vitals/Pain Pain Assessment: No/denies pain     Hand Dominance Right   Extremity/Trunk Assessment Upper Extremity Assessment Upper Extremity Assessment: Generalized weakness;RUE deficits/detail;LUE deficits/detail RUE Deficits / Details: No focal weakness identified; pt attempting to use funcitonally; slow rigid type movemetns; difficulty with holding utensil for self feeding RUE Coordination: decreased fine motor;decreased gross motor LUE Deficits / Details: similar to R   Lower Extremity Assessment Lower Extremity Assessment: Defer to PT evaluation   Cervical / Trunk Assessment Cervical / Trunk  Assessment: Kyphotic;Other exceptions Cervical / Trunk Exceptions: posterior bias   Communication  Communication Communication: Other (comment)(difficulty with fluency from  Parkinson's)   Cognition Arousal/Alertness: Awake/alert Behavior During Therapy: Restless;Flat affect;Impulsive Overall Cognitive Status: Impaired/Different from baseline Area of Impairment: Attention                   Current Attention Level: Selective           General Comments: Pt able to follow commands with increased time; oriented x 3; unsure if pt is having visual hallucinations; trying to place food in his mouth that is not there   General Comments       Exercises     Shoulder Instructions      Home Living Family/patient expects to be discharged to:: Private residence   Available Help at Discharge: Available PRN/intermittently Type of Home: House Home Access: Stairs to enter [employer] of Steps: ramp entry Entrance Stairs-Rails: Left Home Layout: One level(has basement)     Bathroom Shower/Tub: Occupational psychologist: Standard Bathroom Accessibility: No   Home Equipment: Cane - single point;Walker - 2 wheels;Bedside commode;Grab bars - tub/shower;Transport chair          Prior Functioning/Environment Level of Independence: Independent;Independent with assistive device(s)        Comments: uses a cane in the house most of the time; in the community use the can as well        OT Problem List: Decreased strength;Decreased activity tolerance;Impaired balance (sitting and/or standing);Impaired vision/perception;Decreased coordination;Decreased cognition;Decreased safety awareness;Impaired tone;Impaired UE functional use      OT Treatment/Interventions: Self-care/ADL training;Therapeutic exercise;Neuromuscular education;DME and/or AE instruction;Therapeutic activities;Cognitive remediation/compensation;Visual/perceptual remediation/compensation;Patient/family education;Balance training    OT Goals(Current goals can be found in the care plan section)  Acute Rehab OT Goals Patient Stated Goal: to get better OT Goal Formulation: With patient/family Time For Goal Achievement: 04/23/18 Potential to Achieve Goals: Good  OT Frequency: Min 3X/week   Barriers to D/C:            Co-evaluation              AM-PAC OT "6 Clicks" Daily Activity     Outcome Measure Help from another person eating meals?: A Lot Help from another person taking care of personal grooming?: A Lot Help from another person toileting, which includes using toliet, bedpan, or urinal?: A Lot Help from another person bathing (including washing, rinsing, drying)?: A Lot Help from another person to put on and taking off regular upper body clothing?: A Lot Help from another person to put on and taking off regular lower body clothing?: A Lot 6 Click Score: 12   End of Session Equipment Utilized During Treatment: Gait belt Nurse Communication: Mobility status  Activity Tolerance: Patient tolerated treatment well Patient left: in bed;with call bell/phone within reach;with bed alarm set;with family/visitor present  OT Visit Diagnosis: Unsteadiness on feet (R26.81);Other abnormalities of gait and mobility (R26.89);Muscle weakness (generalized) (M62.81);Low vision, both eyes (H54.2);Other symptoms and signs involving cognitive function                Time: 6440-3474 OT Time Calculation (min): 37 min Charges:  OT General Charges $OT Visit: 1 Visit OT Evaluation $OT Eval Moderate Complexity: 1 Mod OT Treatments $Self Care/Home Management : 8-22 mins  Maurie Boettcher, OT/L   Acute OT Clinical Specialist Acute Rehabilitation Services Pager [phone] Office [phone]   [facility] 04/09/2018, 9:04 AM

## 2018-04-09 NOTE — Progress Notes (Signed)
*  Preliminary Results* Carotid artery duplex has been completed. Bilateral internal carotid arteries are 1-39%. Vertebral arteries are patent with antegrade flow.  04/09/2018 9:56 AM  Abram Sander

## 2018-04-09 NOTE — Progress Notes (Signed)
[facility] PRO[facility]ESS NOTE  [name]. OZH:[id number] DOB: 14-May-1941 DOA: 04/08/2018 PCP: [facility]   Narrative: 77 year old male Parkinson's disease initially diagnosed in 2008 plus symptoms followed by Dr. Sherren Mocha Paresthesias secondary to peripheral neuropathy on gabapentin Transcatheter aortic valve replacement 11/18/2013 for severe aortic stenosis Probable restless leg syndrome Basal cell carcinoma face Chronic diastolic dysfunction based on echocardiogram 12/10/2014 Autonomic dysfunction with hypotension likely secondary to Parkinson's  Admitted with more stiffness difficulty ambulating and with coordination Had been on slow taper of Zoloft for the past couple weeks -Came in 48-hour period of worsening bradykinesia eye-opening apraxia and possible new left facial droop-Neurology consulted recommended MRI of the brain Initial work-up showed relatively normal labs BUN/creatinine 16/1.0 no white count CT head negative Ultimately MRI performed did not show any new stroke findings  A & Plan Parkinson's with bradykinesia weakness possible left-sided facial droop-MRI negative-defer stroke work-up-continue Aricept 10 at bedtime-continue Sinemet 2 tabs 3 times a day Constipation-have ordered enema-once he has sool feel reasonable to reassess for d/c TCP-will repeat labs in am-no reported bleeding and might just be inaccurate lab-if still low may need work-up Prior history of transcatheter aortic valve replacement 11/18/2013-stable currently Parkinson's related dementia-continue Zoloft as taper and Klonopin 0.5 at bedtime for sleep Chronic diastolic heart failure-not on diuretics-monitor Chronic kidney disease stage II-III-monitor Paresthesias secondary to peripheral neuropathy-not on meds at this time Overflow incontinence-continue Myrbetriq 50 daily Moderate to severe malnutrition-supplement as needed    DVT prophylaxis: lovenoex  Code Status: full    Family Communication:  Daughter at bedisde    Disposition Plan: inpatient    Verlon Au, MD  [facility] Direct contact: [phone] --Via amion app OR  --[email or web address]; password [email or web address]  7PM-7AM contact night coverage as above 04/09/2018, 7:43 AM  LOS: 0 days   Consultants:  neurology  Procedures:  n  Antimicrobials:  n  Interval history/Subjective: Awake alert pleasant keeps eyes closed but overall seems at his baseline  Objective:  Vitals:  Vitals:   04/08/18 2142 04/09/18 0417  BP: (!) 144/70 (!) 141/73  Pulse: 62 69  Resp:  18  Temp:  97.7 F (36.5 C)  SpO2: 100% 99%    Exam:  Awake alert eyes closed-but opens for exam EOMI ncat cta b no adde dsoudn abd sfot nt nd no rebound no guard No le edema Slow movement of LE's but power remains 5/5 Sensory is intact   I have personally reviewed the following:   Labs:  No new labs--PLT were low 11/25--rpt am  Imaging studies:  y  Medical tests:  y   Test discussed with performing physician:  y  Decision to obtain old records:  y  Review and summation of old records:  y  Scheduled Meds: . aspirin EC  81 mg Oral Daily  . carbidopa-levodopa  2 tablet Oral TID  . cholecalciferol  1,000 Units Oral QHS  . clonazePAM  0.5 mg Oral QHS  . donepezil  10 mg Oral QHS  . enoxaparin (LOVENOX) injection  40 mg Subcutaneous Q24H  . mirabegron ER  50 mg Oral Daily  . sertraline  12.5 mg Oral Daily  . sodium chloride flush  3 mL Intravenous Q12H   Continuous Infusions: . sodium chloride      Active Problems:   TIA (transient ischemic attack)   LOS: 0 days

## 2018-04-09 NOTE — Evaluation (Signed)
Physical Therapy Evaluation Patient Details Name: [name]. MRN: [id number] DOB: [birth year] Today's Date: 04/09/2018   History of Present Illness  77 y.o. male TIA, status post TAVR, restless leg, Parkinson's disease with tremor stiffness bradykinesia (follows with Dr. Carles Collet)  kidney stones, heart murmur, aortic stenosis, diabetes.Marland Kitchen CVA work up underway. MRI - for CVA.   Clinical Impression  Pt was seen for evaluation of mobility and noted his stiffness with attempting upright standing and need to stretch flat on the bed to extend his neck.  Given his PLOF will recommend CIR to restore safe mobility of all AD's and to increase local control of balance on LRAD.  Follow acutely for stretches, postural control and balance training to increase safety with gait.    Follow Up Recommendations CIR    Equipment Recommendations  None recommended by PT    Recommendations for Other Services Rehab consult     Precautions / Restrictions Precautions Precautions: Fall Restrictions Weight Bearing Restrictions: No      Mobility  Bed Mobility Overal bed mobility: Needs Assistance Bed Mobility: Supine to Sit;Sit to Supine     Supine to sit: Mod assist Sit to supine: Mod assist   General bed mobility comments: using bed rail and HOB elevated to get up  Transfers Overall transfer level: Needs assistance Equipment used: 1 person hand held assist Transfers: Sit to/from Stand Sit to Stand: Mod assist Stand pivot transfers: Mod assist       General transfer comment: cues for hand placement  Ambulation/Gait Ambulation/Gait assistance: Mod assist Gait Distance (Feet): 5 Feet Assistive device: 1 person hand held assist;Rolling walker (2 wheeled) Gait Pattern/deviations: Decreased stride length;Trunk flexed;Step-to pattern;Narrow base of support Gait velocity: reduced Gait velocity interpretation: <1.8 ft/sec, indicate of risk for recurrent falls General Gait Details: pt  demonstrates good awareness of the task but PLOF was mod I on SPC and now is lethargic with eyes closed  Stairs            Wheelchair Mobility    Modified Rankin (Stroke Patients Only)       Balance Overall balance assessment: Needs assistance Sitting-balance support: Feet supported Sitting balance-Leahy Scale: Poor     Standing balance support: Bilateral upper extremity supported;Single extremity supported;During functional activity Standing balance-Leahy Scale: Poor                               Pertinent Vitals/Pain Pain Assessment: Faces Pain Score: 0-No pain Faces Pain Scale: No hurt    Home Living Family/patient expects to be discharged to:: Private residence   Available Help at Discharge: Available PRN/intermittently Type of Home: House Home Access: Stairs to enter Entrance Stairs-Rails: Left Entrance Stairs-Number of Steps: ramp entry Home Layout: Two level;Able to live on main level with bedroom/bathroom Home Equipment: Kasandra Knudsen - single point;Walker - 2 wheels;Bedside commode;Grab bars - tub/shower;Transport chair      Prior Function Level of Independence: Independent with assistive device(s)         Comments: uses a cane in the house most of the time; in the community use the can as well     Hand Dominance   Dominant Hand: Right    Extremity/Trunk Assessment                Communication   Communication: Expressive difficulties  Cognition Arousal/Alertness: Lethargic Behavior During Therapy: Flat affect;Restless Overall Cognitive Status: Impaired/Different from baseline Area of Impairment: Attention;Following commands;Safety/judgement  Current Attention Level: Selective   Following Commands: Follows one step commands inconsistently;Follows one step commands with increased time Safety/Judgement: Decreased awareness of safety;Decreased awareness of deficits     General Comments: pt is confused and  does not follow instructions to wait for assistance, using HHA to ambulate due to his confusion with sitter      General Comments General comments (skin integrity, edema, etc.): has increased effort to balance with PT and aware of instructions although inconsistent with them    Exercises     Assessment/Plan    PT Assessment    PT Problem List         PT Treatment Interventions      PT Goals (Current goals can be found in the Care Plan section)  Acute Rehab PT Goals Patient Stated Goal: get better PT Goal Formulation: With patient/family Time For Goal Achievement: 05/07/18 Potential to Achieve Goals: Good    Frequency Min 2X/week   Barriers to discharge        Co-evaluation               AM-PAC PT "6 Clicks" Mobility  Outcome Measure Help needed turning from your back to your side while in a flat bed without using bedrails?: A Lot Help needed moving from lying on your back to sitting on the side of a flat bed without using bedrails?: A Lot Help needed moving to and from a bed to a chair (including a wheelchair)?: A Lot Help needed standing up from a chair using your arms (e.g., wheelchair or bedside chair)?: A Lot Help needed to walk in hospital room?: A Lot Help needed climbing 3-5 steps with a railing? : Total 6 Click Score: 11    End of Session Equipment Utilized During Treatment: Gait belt Activity Tolerance: Patient limited by fatigue Patient left: in bed;with call bell/phone within reach;with bed alarm set;with family/visitor present Nurse Communication: Mobility status PT Visit Diagnosis: Unsteadiness on feet (R26.81);Muscle weakness (generalized) (M62.81)    Time: 0932-6712 PT Time Calculation (min) (ACUTE ONLY): 33 min   Charges:   PT Evaluation $PT Eval Moderate Complexity: 1 Mod PT Treatments $Therapeutic Activity: 8-22 mins       Ramond Dial 04/09/2018, 5:29 PM   Mee Hives, PT MS Acute Rehab Dept. Number: [place] [facility] [place]

## 2018-04-10 ENCOUNTER — Encounter (HOSPITAL_COMMUNITY): Payer: Self-pay | Admitting: Physical Medicine & Rehabilitation

## 2018-04-10 DIAGNOSIS — I5189 Other ill-defined heart diseases: Secondary | ICD-10-CM

## 2018-04-10 DIAGNOSIS — R5381 Other malaise: Secondary | ICD-10-CM | POA: Diagnosis not present

## 2018-04-10 DIAGNOSIS — N3281 Overactive bladder: Secondary | ICD-10-CM | POA: Diagnosis not present

## 2018-04-10 DIAGNOSIS — E1142 Type 2 diabetes mellitus with diabetic polyneuropathy: Secondary | ICD-10-CM | POA: Diagnosis present

## 2018-04-10 DIAGNOSIS — R3981 Functional urinary incontinence: Secondary | ICD-10-CM | POA: Diagnosis not present

## 2018-04-10 DIAGNOSIS — R471 Dysarthria and anarthria: Secondary | ICD-10-CM | POA: Diagnosis present

## 2018-04-10 DIAGNOSIS — R258 Other abnormal involuntary movements: Secondary | ICD-10-CM | POA: Diagnosis not present

## 2018-04-10 DIAGNOSIS — D696 Thrombocytopenia, unspecified: Secondary | ICD-10-CM

## 2018-04-10 DIAGNOSIS — Z880 Allergy status to penicillin: Secondary | ICD-10-CM | POA: Diagnosis not present

## 2018-04-10 DIAGNOSIS — Z66 Do not resuscitate: Secondary | ICD-10-CM | POA: Diagnosis present

## 2018-04-10 DIAGNOSIS — I5032 Chronic diastolic (congestive) heart failure: Secondary | ICD-10-CM | POA: Diagnosis present

## 2018-04-10 DIAGNOSIS — R482 Apraxia: Secondary | ICD-10-CM | POA: Diagnosis present

## 2018-04-10 DIAGNOSIS — D649 Anemia, unspecified: Secondary | ICD-10-CM | POA: Diagnosis present

## 2018-04-10 DIAGNOSIS — Z825 Family history of asthma and other chronic lower respiratory diseases: Secondary | ICD-10-CM | POA: Diagnosis not present

## 2018-04-10 DIAGNOSIS — Z8042 Family history of malignant neoplasm of prostate: Secondary | ICD-10-CM | POA: Diagnosis not present

## 2018-04-10 DIAGNOSIS — I10 Essential (primary) hypertension: Secondary | ICD-10-CM | POA: Diagnosis present

## 2018-04-10 DIAGNOSIS — I130 Hypertensive heart and chronic kidney disease with heart failure and stage 1 through stage 4 chronic kidney disease, or unspecified chronic kidney disease: Secondary | ICD-10-CM | POA: Diagnosis present

## 2018-04-10 DIAGNOSIS — E43 Unspecified severe protein-calorie malnutrition: Secondary | ICD-10-CM | POA: Diagnosis present

## 2018-04-10 DIAGNOSIS — Z885 Allergy status to narcotic agent status: Secondary | ICD-10-CM | POA: Diagnosis not present

## 2018-04-10 DIAGNOSIS — R0989 Other specified symptoms and signs involving the circulatory and respiratory systems: Secondary | ICD-10-CM | POA: Diagnosis present

## 2018-04-10 DIAGNOSIS — D62 Acute posthemorrhagic anemia: Secondary | ICD-10-CM | POA: Diagnosis not present

## 2018-04-10 DIAGNOSIS — Z833 Family history of diabetes mellitus: Secondary | ICD-10-CM | POA: Diagnosis not present

## 2018-04-10 DIAGNOSIS — Z803 Family history of malignant neoplasm of breast: Secondary | ICD-10-CM | POA: Diagnosis not present

## 2018-04-10 DIAGNOSIS — R29810 Facial weakness: Secondary | ICD-10-CM | POA: Diagnosis present

## 2018-04-10 DIAGNOSIS — Z952 Presence of prosthetic heart valve: Secondary | ICD-10-CM

## 2018-04-10 DIAGNOSIS — F419 Anxiety disorder, unspecified: Secondary | ICD-10-CM | POA: Diagnosis present

## 2018-04-10 DIAGNOSIS — F0280 Dementia in other diseases classified elsewhere without behavioral disturbance: Secondary | ICD-10-CM | POA: Diagnosis not present

## 2018-04-10 DIAGNOSIS — Z7982 Long term (current) use of aspirin: Secondary | ICD-10-CM | POA: Diagnosis not present

## 2018-04-10 DIAGNOSIS — N183 Chronic kidney disease, stage 3 (moderate): Secondary | ICD-10-CM | POA: Diagnosis present

## 2018-04-10 DIAGNOSIS — C44310 Basal cell carcinoma of skin of unspecified parts of face: Secondary | ICD-10-CM | POA: Diagnosis present

## 2018-04-10 DIAGNOSIS — K5900 Constipation, unspecified: Secondary | ICD-10-CM | POA: Diagnosis present

## 2018-04-10 DIAGNOSIS — G459 Transient cerebral ischemic attack, unspecified: Secondary | ICD-10-CM | POA: Diagnosis not present

## 2018-04-10 DIAGNOSIS — K5901 Slow transit constipation: Secondary | ICD-10-CM | POA: Diagnosis not present

## 2018-04-10 DIAGNOSIS — G479 Sleep disorder, unspecified: Secondary | ICD-10-CM | POA: Diagnosis not present

## 2018-04-10 DIAGNOSIS — Z6820 Body mass index (BMI) 20.0-20.9, adult: Secondary | ICD-10-CM | POA: Diagnosis not present

## 2018-04-10 DIAGNOSIS — Z8673 Personal history of transient ischemic attack (TIA), and cerebral infarction without residual deficits: Secondary | ICD-10-CM | POA: Diagnosis not present

## 2018-04-10 DIAGNOSIS — F329 Major depressive disorder, single episode, unspecified: Secondary | ICD-10-CM | POA: Diagnosis present

## 2018-04-10 DIAGNOSIS — H109 Unspecified conjunctivitis: Secondary | ICD-10-CM | POA: Diagnosis not present

## 2018-04-10 DIAGNOSIS — Z8249 Family history of ischemic heart disease and other diseases of the circulatory system: Secondary | ICD-10-CM | POA: Diagnosis not present

## 2018-04-10 DIAGNOSIS — G20 Parkinson's disease: Secondary | ICD-10-CM | POA: Diagnosis not present

## 2018-04-10 DIAGNOSIS — N39490 Overflow incontinence: Secondary | ICD-10-CM | POA: Diagnosis present

## 2018-04-10 DIAGNOSIS — R531 Weakness: Secondary | ICD-10-CM | POA: Diagnosis present

## 2018-04-10 DIAGNOSIS — Z87442 Personal history of urinary calculi: Secondary | ICD-10-CM | POA: Diagnosis not present

## 2018-04-10 DIAGNOSIS — G2581 Restless legs syndrome: Secondary | ICD-10-CM | POA: Diagnosis present

## 2018-04-10 DIAGNOSIS — Z953 Presence of xenogenic heart valve: Secondary | ICD-10-CM | POA: Diagnosis not present

## 2018-04-10 DIAGNOSIS — E1122 Type 2 diabetes mellitus with diabetic chronic kidney disease: Secondary | ICD-10-CM | POA: Diagnosis present

## 2018-04-10 LAB — CBC WITH DIFFERENTIAL/PLATELET
Abs Immature Granulocytes: 0.01 10*3/uL (ref 0.00–0.07)
BASOS ABS: 0 10*3/uL (ref 0.0–0.1)
Basophils Relative: 0 %
Eosinophils Absolute: 0.2 10*3/uL (ref 0.0–0.5)
Eosinophils Relative: 3 %
HCT: 44.4 % (ref 39.0–52.0)
HEMOGLOBIN: 13.6 g/dL (ref 13.0–17.0)
IMMATURE GRANULOCYTES: 0 %
LYMPHS ABS: 1.2 10*3/uL (ref 0.7–4.0)
LYMPHS PCT: 20 %
MCH: 27.4 pg (ref 26.0–34.0)
MCHC: 30.6 g/dL (ref 30.0–36.0)
MCV: 89.5 fL (ref 80.0–100.0)
Monocytes Absolute: 0.4 10*3/uL (ref 0.1–1.0)
Monocytes Relative: 7 %
NEUTROS PCT: 70 %
NRBC: 0 % (ref 0.0–0.2)
Neutro Abs: 4.3 10*3/uL (ref 1.7–7.7)
Platelets: 83 10*3/uL — ABNORMAL LOW (ref 150–400)
RBC: 4.96 MIL/uL (ref 4.22–5.81)
RDW: 12.9 % (ref 11.5–15.5)
WBC: 6.1 10*3/uL (ref 4.0–10.5)

## 2018-04-10 LAB — RENAL FUNCTION PANEL
ALBUMIN: 3.8 g/dL (ref 3.5–5.0)
ANION GAP: 6 (ref 5–15)
BUN: 12 mg/dL (ref 8–23)
CALCIUM: 9 mg/dL (ref 8.9–10.3)
CO2: 27 mmol/L (ref 22–32)
Chloride: 108 mmol/L (ref 98–111)
Creatinine, Ser: 0.96 mg/dL (ref 0.61–1.24)
GFR calc Af Amer: >60 mL/min (ref >60)
GLUCOSE: 95 mg/dL (ref 70–99)
Phosphorus: 2.7 mg/dL (ref 2.5–4.6)
Potassium: 3.7 mmol/L (ref 3.5–5.1)
SODIUM: 141 mmol/L (ref 135–145)

## 2018-04-10 NOTE — Progress Notes (Signed)
PROGRESS NOTE  [name]. YQI:[id number] DOB: 1940/08/16 DOA: 04/08/2018 PCP: [facility]   LOS: 0 days   Brief narrative:  Patient is a 77 years old male with past medical history of Parkinson's disease in 2008 followed by neurology Dr. Carles Collet as outpatient, presented to the hospital with stiffness, difficulty ambulating with difficulty opening eyes and worsening bradykinesia.  Neurology had seen the patient and recommended inpatient work-up.  MRI of the brain was performed which did not show any acute findings.  There was some question of left facial droop so the patient was admitted for possible TIA.  Assessment/Plan:  Active Problems:   TIA (transient ischemic attack)   Generalized weakness  TIA.  Ruled out.  MRI was negative.  Neurology has seen the patient.  Patient does have Parkinson's disease with bradykinesia.  Sinemet dose has been increased.  Parkinson's induced dementia.  Continue Zoloft and Klonopin, slow taper.  Chronic diastolic heart failure.  Not on diuretics.  We will closely monitor.  Chronic kidney disease stage II-III.  Closely monitor BMP.  Overflow incontinence.  Continue Myrbetriq.  Constipation.  We will continue the patient on bowel regimen.  Moderate to severe malnutrition.  Continue nutritional support.   VTE Prophylaxis: Lovenox  Code Status:  Full code  Family Communication: Spoke with the patient's daughter at bedside  Disposition Plan:  CIR   Consultants:  Neurology,  Procedures:  None  Antibiotics:  None  Subjective: Patient is slow to respond.  Denies any shortness of breath cough fever.  Is constipated.  Denies any nausea vomiting or abdominal pain.  Has difficulty opening his eyes.  Family is concerned about his drooped eyes  Objective: Vitals:   04/10/18 0352 04/10/18 0800  BP: (!) 154/76 (!) 152/89  Pulse: 68 79  Resp: 18 17  Temp: 97.9 F (36.6 C) 98.2 F (36.8 C)  SpO2: 99% 100%      Intake/Output Summary (Last 24 hours) at 04/10/2018 1206 Last data filed at 04/10/2018 0000 Gross per 24 hour  Intake 120 ml  Output 200 ml  Net -80 ml   Filed Weights   04/08/18 1044 04/08/18 1932  Weight: 60 kg 58.9 kg   Physical Examination: General exam: Appears calm and comfortable ,Not in distress, drooped eyes HEENT:PERRL,Oral mucosa moist Respiratory system: Bilateral equal air entry, normal vesicular breath sounds, no wheezes or crackles  Cardiovascular system: S1 & S2 heard, RRR.  Gastrointestinal system: Abdomen is nondistended, soft and nontender. No organomegaly or masses felt. Normal bowel sounds heard. Central nervous system: Alert and oriented.  Respond.  Bradykinesia.  Tremors noted./  Generalized weakness noted.  Hyperreflexia Extremities: Mild tremors noted skin: No rashes, lesions or ulcers,no icterus ,no pallor MSK: Normal muscle bulk,tone ,power   Data Review: I have personally reviewed the following laboratory data and studies,  CBC: Recent Labs  Lab 04/08/18 1228 04/10/18 0554  WBC 4.5 6.1  NEUTROABS 3.1 4.3  HGB 12.6* 13.6  HCT 40.4 44.4  MCV 91.4 89.5  PLT 75* 83*   Basic Metabolic Panel: Recent Labs  Lab 04/08/18 1228 04/10/18 0554  NA 143 141  K 4.0 3.7  CL 110 108  CO2 28 27  GLUCOSE 114* 95  BUN 16 12  CREATININE 1.04 0.96  CALCIUM 9.0 9.0  PHOS  --  2.7   Liver Function Tests: Recent Labs  Lab 04/08/18 1228 04/10/18 0554  AST 15  --   ALT 14  --   ALKPHOS  76  --   BILITOT 0.6  --   PROT 6.0*  --   ALBUMIN 3.7 3.8   No results for input(s): LIPASE, AMYLASE in the last 168 hours. No results for input(s): AMMONIA in the last 168 hours. Cardiac Enzymes: Recent Labs  Lab 04/08/18 1228  TROPONINI <0.03   BNP (last 3 results) No results for input(s): BNP in the last 8760 hours.  ProBNP (last 3 results) No results for input(s): PROBNP in the last 8760 hours.  CBG: No results for input(s): GLUCAP in the last 168  hours. No results found for this or any previous visit (from the past 240 hour(s)).   Studies: Dg Abdomen 1 View  Result Date: 04/08/2018 CLINICAL DATA:  Altered mental status. EXAM: ABDOMEN - 1 VIEW COMPARISON:  11/16/2017 FINDINGS: Formed stool fills the entire colon. No dilated small bowel. Pelvic calcifications. No concerning mass effect or calcification. IMPRESSION: Large volume, generalized stool retention. Electronically Signed   By: Monte Fantasia M.D.   On: 04/08/2018 15:28   Ct Head Wo Contrast  Result Date: 04/08/2018 CLINICAL DATA:  Altered mental status EXAM: CT HEAD WITHOUT CONTRAST TECHNIQUE: Contiguous axial images were obtained from the base of the skull through the vertex without intravenous contrast. COMPARISON:  11/09/2017 FINDINGS: Brain: Small remote lacunar infarct of the right external capsule. Otherwise, the brainstem, cerebellum, cerebral peduncles, thalami, basal ganglia, basilar cisterns, and ventricular system appear within normal limits. No intracranial hemorrhage, mass lesion, or acute CVA. Vascular: There is atherosclerotic calcification of the cavernous carotid arteries bilaterally. Skull: Unremarkable Sinuses/Orbits: Chronic right maxillary, bilateral ethmoid, left sphenoid, and left frontal sinusitis. Other: Unremarkable IMPRESSION: 1. No acute intracranial findings. 2. Chronic paranasal sinusitis. 3. Small remote lacunar infarct of the right external capsule. Electronically Signed   By: Van Clines M.D.   On: 04/08/2018 12:51   Mr Brain Wo Contrast (neuro Protocol)  Result Date: 04/08/2018 CLINICAL DATA:  Focal neuro deficit with stroke suspected. Altered mental status EXAM: MRI HEAD WITHOUT CONTRAST TECHNIQUE: Multiplanar, multiecho pulse sequences of the brain and surrounding structures were obtained without intravenous contrast. COMPARISON:  Head CT from earlier today.  Brain MRI 02/09/2007 FINDINGS: Brain: Brain atrophy, most notable in the posterior  cerebral regions where there is atrial dilatation, progressed from 2008. Small CSF accumulation is along the interhemispheric fissure and left parietal convexity that are new from 2008 and consistent with hygromas. No significant mass effect. Small remote bilateral cerebellar infarcts. Vascular: Major flow voids are preserved Skull and upper cervical spine: Negative for marrow lesion Sinuses/Orbits: Bilateral cataract resection. Mild ethmoid mucosal thickening. IMPRESSION: 1. No acute finding. 2. Cerebral atrophy, central predominant, that is progressed from 2008. 3. Small hygromas along the interhemispheric fissure and left parietal convexity without significant mass effect. Electronically Signed   By: Monte Fantasia M.D.   On: 04/08/2018 16:45   Vas US Carotid (at [place] Only)  Result Date: 04/09/2018 Carotid Arterial Duplex Study Indications: TIA. Performing Technologist: Abram Sander RVS  Examination Guidelines: A complete evaluation includes B-mode imaging, spectral Doppler, color Doppler, and power Doppler as needed of all accessible portions of each vessel. Bilateral testing is considered an integral part of a complete examination. Limited examinations for reoccurring indications may be performed as noted.  Right Carotid Findings: +----------+--------+--------+--------+-----------+--------+           PSV cm/sEDV cm/sStenosisDescribe   Comments +----------+--------+--------+--------+-----------+--------+ CCA Prox  71      11  homogeneous         +----------+--------+--------+--------+-----------+--------+ CCA Distal57      10              homogeneous         +----------+--------+--------+--------+-----------+--------+ ICA Prox  32      10      1-39%   homogeneous         +----------+--------+--------+--------+-----------+--------+ ICA Distal67      22                                  +----------+--------+--------+--------+-----------+--------+ ECA        65      10                                  +----------+--------+--------+--------+-----------+--------+ +----------+--------+-------+--------+-------------------+           PSV cm/sEDV cmsDescribeArm Pressure (mmHG) +----------+--------+-------+--------+-------------------+ Subclavian80                                         +----------+--------+-------+--------+-------------------+ +---------+--------+--+--------+--+---------+ VertebralPSV cm/s35EDV cm/s12Antegrade +---------+--------+--+--------+--+---------+  Left Carotid Findings: +----------+--------+--------+--------+-----------+--------+           PSV cm/sEDV cm/sStenosisDescribe   Comments +----------+--------+--------+--------+-----------+--------+ CCA Prox  73      11              homogeneous         +----------+--------+--------+--------+-----------+--------+ CCA Distal69      14              homogeneous         +----------+--------+--------+--------+-----------+--------+ ICA Prox  48      14      1-39%   homogeneous         +----------+--------+--------+--------+-----------+--------+ ICA Distal69      16                                  +----------+--------+--------+--------+-----------+--------+ ECA       81                                          +----------+--------+--------+--------+-----------+--------+ +----------+--------+--------+--------+-------------------+ SubclavianPSV cm/sEDV cm/sDescribeArm Pressure (mmHG) +----------+--------+--------+--------+-------------------+           76                                          +----------+--------+--------+--------+-------------------+ +---------+--------+--+--------+-+---------+ VertebralPSV cm/s33EDV cm/s9Antegrade +---------+--------+--+--------+-+---------+  Summary: Right Carotid: Velocities in the right ICA are consistent with a 1-39% stenosis. Left Carotid: Velocities in the left ICA are consistent with a  1-39% stenosis. Vertebrals: Bilateral vertebral arteries demonstrate antegrade flow. *See table(s) above for measurements and observations.  Electronically signed by Antony Contras MD on 04/09/2018 at 2:26:17 PM.    Final     Scheduled Meds: . aspirin EC  81 mg Oral Daily  . carbidopa-levodopa  2 tablet Oral QID  . cholecalciferol  1,000 Units Oral QHS  . clonazePAM  0.5 mg Oral QHS  . donepezil  10 mg Oral QHS  .  enoxaparin (LOVENOX) injection  40 mg Subcutaneous Q24H  . mirabegron ER  50 mg Oral Daily  . sertraline  12.5 mg Oral Daily  . sodium chloride flush  3 mL Intravenous Q12H   Continuous Infusions: . sodium chloride      Time spent: 25 minutes. More than 50% of that time was spent in counseling and/or coordination of care.  Kanon Novosel  [facility] Pager [phone]  If 7PM-7AM, please contact night-coverage at [email or web address], password [facility] 04/10/2018, 12:06 PM

## 2018-04-10 NOTE — Progress Notes (Signed)
Occupational Therapy Treatment Patient Details Name: [name]. MRN: [id number] DOB: [birth year] Today's Date: 04/10/2018    History of present illness 77 y.o. male TIA, status post TAVR, restless leg, Parkinson's disease with tremor stiffness bradykinesia (follows with Dr. Carles Collet)  kidney stones, heart murmur, aortic stenosis, diabetes.Marland Kitchen CVA work up underway. MRI - for CVA.    OT comments  Pt making steady progress towards goals this session. Pt initially lethargic but waking up and speaking to therapist and actively participation. Pt requiring increased time and max multimodal cuing to sequencing bed mobility and functional transfers. Pt sitting on EOB for static sitting balance with posterior lean noted. Pt performing standing with mod lifting assistance and transferring into recliner chair with min - mod balance, increased time, shuffled gait, and assist for hand placement. Pt seated in recliner chair with chair alarm activated and family present in room. Pt very alert once in chair and speaking to other staff entering the room.   Follow Up Recommendations  CIR;Supervision/Assistance - 24 hour    Equipment Recommendations  Tub/shower seat    Recommendations for Other Services      Precautions / Restrictions Precautions Precautions: Fall Restrictions Weight Bearing Restrictions: No       Mobility Bed Mobility Overal bed mobility: Needs Assistance Bed Mobility: Supine to Sit;Sit to Supine     Supine to sit: Mod assist Sit to supine: Mod assist   General bed mobility comments: using bed rail and HOB elevated . Pt needing assistance scooting bottom closer to EOB with mod cuing for hand placement and increased time to initiate task  Transfers Overall transfer level: Needs assistance Equipment used: 1 person hand held assist Transfers: Sit to/from Stand Sit to Stand: Mod assist Stand pivot transfers: Mod assist       General transfer comment: cues for hand  placement    Balance Overall balance assessment: Needs assistance Sitting-balance support: Feet supported Sitting balance-Leahy Scale: Poor Sitting balance - Comments: static sitting balance with B UEs on bed and feet supported for 3 minutes with min -mod A   Standing balance support: Bilateral upper extremity supported;Single extremity supported;During functional activity Standing balance-Leahy Scale: Poor Standing balance comment: posterior lean        ADL either performed or assessed with clinical judgement        Vision Baseline Vision/History: Wears glasses Wears Glasses: At all times            Cognition Arousal/Alertness: Lethargic Behavior During Therapy: Flat affect;Restless Overall Cognitive Status: Impaired/Different from baseline Area of Impairment: Attention;Following commands;Safety/judgement      Current Attention Level: Selective   Following Commands: Follows one step commands inconsistently;Follows one step commands with increased time Safety/Judgement: Decreased awareness of safety;Decreased awareness of deficits     General Comments: pt required increased time and max multimodal cuing for command following                   Pertinent Vitals/ Pain       Pain Assessment: Faces Pain Score: 0-No pain Faces Pain Scale: No hurt         Frequency  Min 3X/week        Progress Toward Goals  OT Goals(current goals can now be found in the care plan section)  Progress towards OT goals: Progressing toward goals  Acute Rehab OT Goals Patient Stated Goal: get better OT Goal Formulation: With patient/family Time For Goal Achievement: 04/24/18 Potential to Achieve Goals: Good  Plan  AM-PAC OT "6 Clicks" Daily Activity     Outcome Measure   Help from another person eating meals?: A Lot Help from another person taking care of personal grooming?: A Lot Help from another person toileting, which includes using toliet, bedpan, or  urinal?: A Lot Help from another person bathing (including washing, rinsing, drying)?: A Lot Help from another person to put on and taking off regular upper body clothing?: A Lot Help from another person to put on and taking off regular lower body clothing?: A Lot 6 Click Score: 12    End of Session    OT Visit Diagnosis: Unsteadiness on feet (R26.81);Other abnormalities of gait and mobility (R26.89);Muscle weakness (generalized) (M62.81);Low vision, both eyes (H54.2);Other symptoms and signs involving cognitive function   Activity Tolerance Patient tolerated treatment well   Patient Left in bed;with call bell/phone within reach;with bed alarm set;with family/visitor present   Nurse Communication Mobility status        Time: 1256-1320 OT Time Calculation (min): 24 min  Charges: OT General Charges $OT Visit: 1 Visit OT Treatments $Therapeutic Activity: 23-37 mins    Jadie Comas P, MS, OTR/L 04/10/2018, 2:31 PM

## 2018-04-10 NOTE — Telephone Encounter (Signed)
Dr. Carles Collet Juluis Rainier for Monday.

## 2018-04-10 NOTE — Consult Note (Signed)
Physical Medicine and Rehabilitation Consult   Reason for Consult: Functional deficits/Parkinson's disease Referring Physician: Dr. Louanne Belton   HPI: Bryce Shadd. is a 77 y.o. male with history of T2DM, headaches, aortic stenosis s/p TVAR, Parkinson's disease with baseline mild cognitive deficits; who was admitted on 04/08/2018 with 48-hour history of significant difficulty with gait with bradykinesia, difficulty opening his eyes and new left facial droop.  History taken from chart review and family.  Patient with history of lid opening apraxia (worsening due in the past due to constipation) with plans for Botox in the future and neurology question stroke as cause of lid apraxia.  MRI brain reviewed, showing atrophy.  Per report, cerebral atrophy with small hygromas without mass-effect.  2D echo done showing EF 60 to 65% with grade 1 diastolic dysfunction.  Carotid Dopplers were negative for significant ICA stenosis.  KUB done revealing large volume stool filling entire colon. Neurology felt that patient without clinical cause of worsening and recommended increasing Sinemet to 2 tabs 4 times daily as well as management of constipation.  Therapy evaluations done revealing impairments in mobility, ADLs as well as cognition and speech.  CIR recommended for follow-up therapy   Review of Systems  Unable to perform ROS: Mental acuity   Past Medical History:  Diagnosis Date  . Anxiety   . Aortic stenosis   . Arthritis    left hip  . Cancer ([place])    basal cell- facial?  . Depression   . Diabetes mellitus without complication ([place])   . GERD (gastroesophageal reflux disease)   . Headache(784.0)   . Heart murmur   . Hernia, inguinal, bilateral, recurrent   . Kidney stones   . Parkinson disease ([place])   . Parkinson's disease (tremor, stiffness, slow motion, unstable posture) ([place]) 12/11/2006  . PONV (postoperative nausea and vomiting)   . Restless leg syndrome   . S/P TAVR  (transcatheter aortic valve replacement) 11/18/2013   26 mm Edwards Sapien XT transcatheter heart valve placed via open right transfemoral approach  . TIA (transient ischemic attack) 02/08/2007    Past Surgical History:  Procedure Laterality Date  . CARDIAC CATHETERIZATION  10/09/13  . cataract  2008   bilateral  . COLONOSCOPY    . CYSTOSCOPY/RETROGRADE/URETEROSCOPY  05/29/2011   Procedure: CYSTOSCOPY/RETROGRADE/URETEROSCOPY;  Surgeon: Malka So, MD;  Location: [facility];  Service: Urology;  Laterality: Left;  no stent   . EYE SURGERY Bilateral    /w IOL  . INGUINAL HERNIA REPAIR Bilateral 03/30/2014   Procedure: OPEN BILATERAL INGUINAL HERNIA REPAIR WITH MESH;  Surgeon: Coralie Keens, MD;  Location: [facility];  Service: General;  Laterality: Bilateral;  . INSERTION OF MESH Bilateral 03/30/2014   Procedure: INSERTION OF MESH;  Surgeon: Coralie Keens, MD;  Location: [place] [place] [facility];  Service: General;  Laterality: Bilateral;  . INTRAOPERATIVE TRANSESOPHAGEAL ECHOCARDIOGRAM N/A 11/18/2013   Procedure: INTRAOPERATIVE TRANSESOPHAGEAL ECHOCARDIOGRAM;  Surgeon: Sherren Mocha, MD;  Location: [facility] OR;  Service: Open Heart Surgery;  Laterality: N/A;  . LEFT AND RIGHT HEART CATHETERIZATION WITH CORONARY ANGIOGRAM N/A 10/09/2013   Procedure: LEFT AND RIGHT HEART CATHETERIZATION WITH CORONARY ANGIOGRAM;  Surgeon: Blane Ohara, MD;  Location: [facility] CATH LAB;  Service: Cardiovascular;  Laterality: N/A;  . TRANSCATHETER AORTIC VALVE REPLACEMENT, TRANSFEMORAL N/A 11/18/2013   Procedure: TRANSCATHETER AORTIC VALVE REPLACEMENT, TRANSFEMORAL;  Surgeon: Sherren Mocha, MD;  Location: [place];  Service: Open Heart Surgery;  Laterality: N/A;    Family History  Problem Relation  Age of Onset  . Arthritis/Rheumatoid Mother   . Prostate cancer Father   . Anemia Daughter   . Asthma Daughter   . Diabetes Daughter   . Hypertension Daughter   . Kidney disease Daughter   . Cancer Son   . Breast cancer Sister   . Heart attack Neg  Hx   . Stroke Neg Hx     Social History:  Lives alone and independent with cane PTA. Has caregiver 3 hours a day and different family members check in thorough out the day. " Sleeps a lot" per sister next door who helps with meals. Per reports that he has never smoked. He has never used smokeless tobacco. Per reports that he does not drink alcohol or use drugs.   Allergies  Allergen Reactions  . Penicillins     Unsure of reaction  . Vicodin [Hydrocodone-Acetaminophen] Other (See Comments)    "Mental reaction"   Medications Prior to Admission  Medication Sig Dispense Refill  . acetaminophen (TYLENOL) 500 MG tablet Take 1,000 mg by mouth 2 (two) times daily as needed (pain). For pain    . aspirin EC 81 MG tablet Take 1 tablet (81 mg total) by mouth daily.    . carbidopa-levodopa (SINEMET IR) 25-100 MG tablet Take 2 tablets by mouth 3 (three) times daily. 540 tablet 1  . cholecalciferol (VITAMIN D) 1000 UNITS tablet Take 1,000 Units by mouth at bedtime.     . clonazePAM (KLONOPIN) 0.5 MG tablet TAKE 1 TABLET (0.5 MG TOTAL) BY MOUTH AT BEDTIME. 90 tablet 1  . donepezil (ARICEPT) 10 MG tablet Take 1 tablet (10 mg total) by mouth at bedtime. 90 tablet 1  . MYRBETRIQ 50 MG TB24 tablet Take 50 mg by mouth daily.  11  . sertraline (ZOLOFT) 25 MG tablet Take 12.5 mg by mouth daily.       Home: Home Living Family/patient expects to be discharged to:: Private residence Available Help at Discharge: Available PRN/intermittently Type of Home: House Home Access: Stairs to enter [employer] of Steps: ramp entry Entrance Stairs-Rails: Left Home Layout: Two level, Able to live on main level with bedroom/bathroom Bathroom Shower/Tub: Multimedia programmer: Standard Bathroom Accessibility: No Home Equipment: [place] - single point, Environmental consultant - 2 wheels, Bedside commode, Grab bars - tub/shower, Transport chair  Lives With: Other (Comment)(caregivers and family)  Functional  History: Prior Function Level of Independence: Independent with assistive device(s) Comments: uses a cane in the house most of the time; in the community use the can as well Functional Status:  Mobility: Bed Mobility Overal bed mobility: Needs Assistance Bed Mobility: Supine to Sit, Sit to Supine Supine to sit: Mod assist Sit to supine: Mod assist General bed mobility comments: using bed rail and HOB elevated to get up Transfers Overall transfer level: Needs assistance Equipment used: 1 person hand held assist Transfers: Sit to/from Stand Sit to Stand: Mod assist Stand pivot transfers: Mod assist General transfer comment: cues for hand placement Ambulation/Gait Ambulation/Gait assistance: Mod assist Gait Distance (Feet): 5 Feet Assistive device: 1 person hand held assist, Rolling walker (2 wheeled) Gait Pattern/deviations: Decreased stride length, Trunk flexed, Step-to pattern, Narrow base of support General Gait Details: pt demonstrates good awareness of the task but PLOF was mod I on SPC and now is lethargic with eyes closed Gait velocity: reduced Gait velocity interpretation: <1.8 ft/sec, indicate of risk for recurrent falls    ADL: ADL Overall ADL's : Needs assistance/impaired Eating/Feeding: Maximal assistance Eating/Feeding Details (indicate  cue type and reason): daughter feeding pt; appesr having difficulty keeping eyes open; bringing ofood to mouth that is not there Grooming: Maximal assistance Upper Body Bathing: Maximal assistance, Sitting Lower Body Bathing: Maximal assistance, Sit to/from stand Upper Body Dressing : Maximal assistance, Sitting Lower Body Dressing: Maximal assistance, Sit to/from stand Lower Body Dressing Details (indicate cue type and reason): pt able to lean forward to touch socks and begin pulling socks up; appears to perseverate on task Toilet Transfer: Maximal assistance Toilet Transfer Details (indicate cue type and reason):  simulated Toileting- Clothing Manipulation and Hygiene: Maximal assistance Functional mobility during ADLs: Maximal assistance, +2 for safety/equipment  Cognition: Cognition Overall Cognitive Status: Impaired/Different from baseline Arousal/Alertness: Lethargic Orientation Level: Oriented to person, Oriented to place, Disoriented to time, Disoriented to situation Attention: Sustained Sustained Attention: Impaired Sustained Attention Impairment: Verbal basic, Functional basic(kept eyes closed during assessment) Memory: Impaired Memory Impairment: Retrieval deficit, Decreased recall of new information, Decreased short term memory Decreased Short Term Memory: Verbal basic, Functional basic Awareness: Appears intact Problem Solving: Impaired Problem Solving Impairment: Verbal basic, Functional basic Executive Function: Organizing, Decision Making Organizing: Impaired Organizing Impairment: Verbal basic, Functional basic Decision Making: Impaired Decision Making Impairment: Verbal basic, Functional basic Behaviors: Perseveration Safety/Judgment: Other (comment)(DTA) Cognition Arousal/Alertness: Lethargic Behavior During Therapy: Flat affect, Restless Overall Cognitive Status: Impaired/Different from baseline Area of Impairment: Attention, Following commands, Safety/judgement Current Attention Level: Selective Following Commands: Follows one step commands inconsistently, Follows one step commands with increased time Safety/Judgement: Decreased awareness of safety, Decreased awareness of deficits General Comments: pt is confused and does not follow instructions to wait for assistance, using HHA to ambulate due to his confusion with sitter   Blood pressure (!) 152/89, pulse 79, temperature 98.2 F (36.8 C), temperature source Oral, resp. rate 17, height 5\' 6"  (1.676 m), weight 58.9 kg, SpO2 100 %. Physical Exam  Nursing note and vitals reviewed. Constitutional: He appears  well-developed. He appears lethargic. He appears cachectic. No distress.  Frail appearing elderly male- up in chair leaning over to right.   HENT:  Head: Normocephalic and atraumatic.  Eyes: Right eye exhibits no discharge. Left eye exhibits no discharge.  Left medial occular hemorrhage   Neck: Normal range of motion. Neck supple.  Cardiovascular: Normal rate and regular rhythm.  Respiratory: Effort normal and breath sounds normal.  GI: Soft. Bowel sounds are normal.  Musculoskeletal:  No edema or tenderness in extremities  Neurological: He appears lethargic.  Expressive aphasia Exteremly delayed but was able to state name as [name]. Disoriented with mumbling speech and question hallucinating--asking about tractor that was here.  Severe lean to the right (chronic per family).   Bilateral ptosis but able to open eyes with cues and interact.  Bradykinesia with extensor tone LLE.  Motor: Right upper extremity 4-5 proximal distal apraxia Left upper extremity small distal Right lower extremity: Hip flexion, knee extension 3+/5, ankle dorsiflexion 4/5 Left lower extremity: Hip flexion, knee extension 4/5, ankle dorsal flexion 4+/5  Skin: Skin is warm and dry. He is not diaphoretic.  Psychiatric: His affect is blunt. His speech is delayed and slurred. He is slowed.    Results for orders placed or performed during the hospital encounter of 04/08/18 (from the past 24 hour(s))  Renal function panel     Status: None   Collection Time: 04/10/18  5:54 AM  Result Value Ref Range   Sodium 141 135 - 145 mmol/L   Potassium 3.7 3.5 - 5.1 mmol/L   Chloride 108  98 - 111 mmol/L   CO2 27 22 - 32 mmol/L   Glucose, Bld 95 70 - 99 mg/dL   BUN 12 8 - 23 mg/dL   Creatinine, Ser 0.96 0.61 - 1.24 mg/dL   Calcium 9.0 8.9 - 10.3 mg/dL   Phosphorus 2.7 2.5 - 4.6 mg/dL   Albumin 3.8 3.5 - 5.0 g/dL   GFR calc non Af Amer >60 >60 mL/min   GFR calc Af Amer >60 >60 mL/min   Anion gap 6 5 - 15  CBC with  Differential/Platelet     Status: Abnormal   Collection Time: 04/10/18  5:54 AM  Result Value Ref Range   WBC 6.1 4.0 - 10.5 K/uL   RBC 4.96 4.22 - 5.81 MIL/uL   Hemoglobin 13.6 13.0 - 17.0 g/dL   HCT 44.4 39.0 - 52.0 %   MCV 89.5 80.0 - 100.0 fL   MCH 27.4 26.0 - 34.0 pg   MCHC 30.6 30.0 - 36.0 g/dL   RDW 12.9 11.5 - 15.5 %   Platelets 83 (L) 150 - 400 K/uL   nRBC 0.0 0.0 - 0.2 %   Neutrophils Relative % 70 %   Neutro Abs 4.3 1.7 - 7.7 K/uL   Lymphocytes Relative 20 %   Lymphs Abs 1.2 0.7 - 4.0 K/uL   Monocytes Relative 7 %   Monocytes Absolute 0.4 0.1 - 1.0 K/uL   Eosinophils Relative 3 %   Eosinophils Absolute 0.2 0.0 - 0.5 K/uL   Basophils Relative 0 %   Basophils Absolute 0.0 0.0 - 0.1 K/uL   Immature Granulocytes 0 %   Abs Immature Granulocytes 0.01 0.00 - 0.07 K/uL   Dg Abdomen 1 View  Result Date: 04/08/2018 CLINICAL DATA:  Altered mental status. EXAM: ABDOMEN - 1 VIEW COMPARISON:  11/16/2017 FINDINGS: Formed stool fills the entire colon. No dilated small bowel. Pelvic calcifications. No concerning mass effect or calcification. IMPRESSION: Large volume, generalized stool retention. Electronically Signed   By: Monte Fantasia M.D.   On: 04/08/2018 15:28   Ct Head Wo Contrast  Result Date: 04/08/2018 CLINICAL DATA:  Altered mental status EXAM: CT HEAD WITHOUT CONTRAST TECHNIQUE: Contiguous axial images were obtained from the base of the skull through the vertex without intravenous contrast. COMPARISON:  11/09/2017 FINDINGS: Brain: Small remote lacunar infarct of the right external capsule. Otherwise, the brainstem, cerebellum, cerebral peduncles, thalami, basal ganglia, basilar cisterns, and ventricular system appear within normal limits. No intracranial hemorrhage, mass lesion, or acute CVA. Vascular: There is atherosclerotic calcification of the cavernous carotid arteries bilaterally. Skull: Unremarkable Sinuses/Orbits: Chronic right maxillary, bilateral ethmoid, left  sphenoid, and left frontal sinusitis. Other: Unremarkable IMPRESSION: 1. No acute intracranial findings. 2. Chronic paranasal sinusitis. 3. Small remote lacunar infarct of the right external capsule. Electronically Signed   By: Van Clines M.D.   On: 04/08/2018 12:51   Mr Brain Wo Contrast (neuro Protocol)  Result Date: 04/08/2018 CLINICAL DATA:  Focal neuro deficit with stroke suspected. Altered mental status EXAM: MRI HEAD WITHOUT CONTRAST TECHNIQUE: Multiplanar, multiecho pulse sequences of the brain and surrounding structures were obtained without intravenous contrast. COMPARISON:  Head CT from earlier today.  Brain MRI 02/09/2007 FINDINGS: Brain: Brain atrophy, most notable in the posterior cerebral regions where there is atrial dilatation, progressed from 2008. Small CSF accumulation is along the interhemispheric fissure and left parietal convexity that are new from 2008 and consistent with hygromas. No significant mass effect. Small remote bilateral cerebellar infarcts. Vascular: Major flow voids are  preserved Skull and upper cervical spine: Negative for marrow lesion Sinuses/Orbits: Bilateral cataract resection. Mild ethmoid mucosal thickening. IMPRESSION: 1. No acute finding. 2. Cerebral atrophy, central predominant, that is progressed from 2008. 3. Small hygromas along the interhemispheric fissure and left parietal convexity without significant mass effect. Electronically Signed   By: Monte Fantasia M.D.   On: 04/08/2018 16:45   Vas US Carotid (at [place] Only)  Result Date: 04/09/2018 Carotid Arterial Duplex Study Indications: TIA. Performing Technologist: Abram Sander RVS  Examination Guidelines: A complete evaluation includes B-mode imaging, spectral Doppler, color Doppler, and power Doppler as needed of all accessible portions of each vessel. Bilateral testing is considered an integral part of a complete examination. Limited examinations for reoccurring indications may be performed  as noted.  Right Carotid Findings: +----------+--------+--------+--------+-----------+--------+           PSV cm/sEDV cm/sStenosisDescribe   Comments +----------+--------+--------+--------+-----------+--------+ CCA Prox  71      11              homogeneous         +----------+--------+--------+--------+-----------+--------+ CCA Distal57      10              homogeneous         +----------+--------+--------+--------+-----------+--------+ ICA Prox  32      10      1-39%   homogeneous         +----------+--------+--------+--------+-----------+--------+ ICA Distal67      22                                  +----------+--------+--------+--------+-----------+--------+ ECA       65      10                                  +----------+--------+--------+--------+-----------+--------+ +----------+--------+-------+--------+-------------------+           PSV cm/sEDV cmsDescribeArm Pressure (mmHG) +----------+--------+-------+--------+-------------------+ Subclavian80                                         +----------+--------+-------+--------+-------------------+ +---------+--------+--+--------+--+---------+ VertebralPSV cm/s35EDV cm/s12Antegrade +---------+--------+--+--------+--+---------+  Left Carotid Findings: +----------+--------+--------+--------+-----------+--------+           PSV cm/sEDV cm/sStenosisDescribe   Comments +----------+--------+--------+--------+-----------+--------+ CCA Prox  73      11              homogeneous         +----------+--------+--------+--------+-----------+--------+ CCA Distal69      14              homogeneous         +----------+--------+--------+--------+-----------+--------+ ICA Prox  48      14      1-39%   homogeneous         +----------+--------+--------+--------+-----------+--------+ ICA Distal69      16                                   +----------+--------+--------+--------+-----------+--------+ ECA       81                                          +----------+--------+--------+--------+-----------+--------+ +----------+--------+--------+--------+-------------------+  SubclavianPSV cm/sEDV cm/sDescribeArm Pressure (mmHG) +----------+--------+--------+--------+-------------------+           76                                          +----------+--------+--------+--------+-------------------+ +---------+--------+--+--------+-+---------+ VertebralPSV cm/s33EDV cm/s9Antegrade +---------+--------+--+--------+-+---------+  Summary: Right Carotid: Velocities in the right ICA are consistent with a 1-39% stenosis. Left Carotid: Velocities in the left ICA are consistent with a 1-39% stenosis. Vertebrals: Bilateral vertebral arteries demonstrate antegrade flow. *See table(s) above for measurements and observations.  Electronically signed by Antony Contras MD on 04/09/2018 at 2:26:17 PM.    Final     Assessment/Plan: Diagnosis: Debility Labs and images (see above) independently reviewed.  Records reviewed and summated above.  1. Does the need for close, 24 hr/day medical supervision in concert with the patient's rehab needs make it unreasonable for this patient to be served in a less intensive setting? Potentially  2. Co-Morbidities requiring supervision/potential complications: diastolic dysfunction (monitor for signs and symptoms of fluid overload), T2 DM (Monitor in accordance with exercise and adjust meds as necessary), headaches, aortic stenosis s/p TVAR (Monitor in accordance with increased physical activity and avoid UE resistance excercises), Parkinson's disease with baseline mild cognitive deficits (continue meds per neurology), HTN (monitor and provide prns in accordance with increased physical exertion and pain), Thrombocytopenia (< 60,000/mm3 no resistive exercise) 3. Due to bladder management, safety, disease  management, medication administration and patient education, does the patient require 24 hr/day rehab nursing? Yes 4. Does the patient require coordinated care of a physician, rehab nurse, PT (1-2 hrs/day, 5 days/week), OT (1-2 hrs/day, 5 days/week) and SLP (1-2 hrs/day, 5 days/week) to address physical and functional deficits in the context of the above medical diagnosis(es)? Yes Addressing deficits in the following areas: balance, endurance, locomotion, strength, transferring, bathing, dressing, toileting, cognition, speech and psychosocial support 5. Can the patient actively participate in an intensive therapy program of at least 3 hrs of therapy per day at least 5 days per week? Potentially 6. The potential for patient to make measurable gains while on inpatient rehab is good 7. Anticipated functional outcomes upon discharge from inpatient rehab are min assist  with PT, min assist with OT, min assist and mod assist with SLP. 8. Estimated rehab length of stay to reach the above functional goals is: 17-20 days. 9. Anticipated D/C setting: SNF 10. Anticipated post D/C treatments: SNF 11. Overall Rehab/Functional Prognosis: good and fair  RECOMMENDATIONS: This patient's condition is appropriate for continued rehabilitative care in the following setting: Will monitor for progress with increase in Sinemet.  If no significant improvement with change in medication may require further work-up and/or SNF placement. Patient has agreed to participate in recommended program. Potentially Note that insurance prior authorization may be required for reimbursement for recommended care.  Comment: Rehab Admissions Coordinator to follow up.   I have personally performed a face to face diagnostic evaluation, including, but not limited to relevant history and physical exam findings, of this patient and developed relevant assessment and plan.  Additionally, I have reviewed and concur with the physician assistant's  documentation above.   Delice Lesch, MD, ABPMR Bary Leriche, PA-C 04/10/2018

## 2018-04-10 NOTE — Progress Notes (Addendum)
Subjective: Patient awake, alert, up in chair at bedside. No family present. NAD.  Exam: Vitals:   04/10/18 0352 04/10/18 0800  BP: (!) 154/76 (!) 152/89  Pulse: 68 79  Resp: 18 17  Temp: 97.9 F (36.6 C) 98.2 F (36.8 C)  SpO2: 99% 100%   Gen: In bed, NAD HEENT: left eye redness. Resp: non-labored breathing, no acute distress Card: S1, S2 heard,   Pulm: CTAB Abd: BS present x 4 quadrants   Neurological Examination Mental Status: Alert, oriented to name only. thought content appropriate.   Cranial Nerves: II:  Visual fields grossly normal,  III,IV, VI: ptosis not present, extra-ocular motions intact bilaterally, pupils equal, round, reactive to light and accommodation V,VII: smile asymmetric,slight right facial droop, light touch sensation normal bilaterally VIII: hearing normal bilaterally IX,X: uvula rises symmetrically XI: bilateral shoulder shrug XII: midline tongue extension Motor: Right : Upper extremity   4/5  Left:     Upper extremity   4/5  Lower extremity   4/5   Lower extremity   4/5 bradykinetic Tone and bulk: increased tone Sensory:  light touch intact throughout, bilaterally Deep Tendon Reflexes: hyperreflexic throughout Plantars: Right: downgoing   Left: downgoing Cerebellar: FNF intact Gait: deferred   Impression: 77 year old male with known Parkinson's disease presented to the hospital with a 48-hour to 24-hour period of worsening of bradykinesia, freezing, eye-opening apraxia. Without other clear cause for worsening of his underlying neurodegenerative condition, I do think that constipation needs to be addressed. He may need placement if unable to meet care needs at home.   Recommendations: -- Continue sinemet, increase to 2 tabs QID --will defer constipation treatment to IM --may need placement, at least for the short/intermediate term.   Laurey Morale, MSN, NP-C [facility] [phone]   I have seen the patient and  reviewed the above note.  He is significantly better today, awake, talkative, I am able to understand much better.  I continue to suspect that his constipation may have played a role here.  I would continue to recommend a bowel regimen to get his bowels moving.  At this time, I would continue his current increased dose of Sinemet.  Though he may need placement, I do not think I make any other changes in the inpatient setting, he can follow-up with Dr. Carles Collet as an outpatient.  Neurology will sign off at this time, please call with further questions or concerns.  Roland Rack, MD [facility] [phone]  If 7pm- 7am, please page neurology on call as listed in [place].

## 2018-04-11 MED ORDER — BISACODYL 10 MG RE SUPP
10.0000 mg | Freq: Every day | RECTAL | Status: DC | PRN
  Filled 2018-04-11: qty 1

## 2018-04-11 MED ORDER — POLYETHYLENE GLYCOL 3350 17 G PO PACK
17.0000 g | PACK | Freq: Every day | ORAL | Status: DC
  Administered 2018-04-11 – 2018-04-15 (×5): 17 g via ORAL
  Filled 2018-04-11 (×5): qty 1

## 2018-04-11 MED ORDER — DOCUSATE SODIUM 100 MG PO CAPS
100.0000 mg | ORAL_CAPSULE | Freq: Two times a day (BID) | ORAL | Status: DC
  Administered 2018-04-11 – 2018-04-15 (×9): 100 mg via ORAL
  Filled 2018-04-11 (×9): qty 1

## 2018-04-11 NOTE — Progress Notes (Addendum)
PROGRESS NOTE  [name]. EXH:[id number] DOB: [birth year] DOA: 04/08/2018 PCP: [facility]   LOS: 1 day   Brief narrative:  Patient is a 77 years old male with past medical history of Parkinson's disease in 2008 followed by neurology Dr. Carles Collet as outpatient, presented to the hospital with stiffness, difficulty ambulating with difficulty opening eyes and worsening bradykinesia.  Neurology had seen the patient and recommended inpatient work-up.  MRI of the brain was performed which did not show any acute findings.  There was some question of left facial droop so the patient was admitted for possible TIA.  Assessment/Plan:  Active Problems:   TIA (transient ischemic attack)   Generalized weakness   Diastolic dysfunction   Thrombocytopenia (HCC)   Hypertension  TIA.  Ruled out.  MRI was negative.  Neurology has seen the patient.  Patient does have Parkinson's disease with bradykinesia.  Sinemet dose has been increased.  Mildly improved eye-opening today.  Parkinson's induced dementia.  Continue Zoloft and Klonopin, slow taper.  Chronic diastolic heart failure.  Not on diuretics.  We will closely monitor.  Chronic kidney disease stage II-III.  Closely monitor BMP.  Overflow incontinence.  Continue Myrbetriq.  Constipation.  We will continue the patient on bowel regimen.  Moderate to severe malnutrition.  Continue nutritional support.   VTE Prophylaxis: Lovenox  Code Status:  Full code  Family Communication: I  spoke with the patient's family (daughter) at bedside.  Disposition Plan:  CIR, pending evaluation.   Consultants:  Neurology,  Procedures:  None  Antibiotics:  None  Subjective:  Patient has mildly improved eye-opening today.  Still has bradykinesia slow speech.  Denies any pain cough fever or chest pain.  Objective: Vitals:   04/11/18 0818 04/11/18 1152  BP: (!) 147/68 (!) 90/56  Pulse: 72 70  Resp: 16 16  Temp: (!)  97.4 F (36.3 C) (!) 97.5 F (36.4 C)  SpO2: 95% 98%    Intake/Output Summary (Last 24 hours) at 04/11/2018 1224 Last data filed at 04/10/2018 2200 Gross per 24 hour  Intake 120 ml  Output -  Net 120 ml   Filed Weights   04/08/18 1044 04/08/18 1932  Weight: 60 kg 58.9 kg   Physical Examination: General exam: Appears calm and comfortable ,Not in distress, drooped eyelids HEENT:PERRL,Oral mucosa moist Respiratory system: Bilateral equal air entry, normal vesicular breath sounds, no wheezes or crackles  Cardiovascular system: S1 & S2 heard, RRR.  Gastrointestinal system: Abdomen is nondistended, soft and nontender. No organomegaly or masses felt. Normal bowel sounds heard. Central nervous system: Alert awake and communicative.  Bradykinesia.  Mild tremors noted.  Generalized weakness noted.   Extremities: No edema, no clubbing ,no cyanosis, distal peripheral pulses palpable. Skin: No rashes, lesions or ulcers,no icterus ,no pallor MSK: Normal muscle bulk,tone ,power   Data Review: I have personally reviewed the following laboratory data and studies,  CBC: Recent Labs  Lab 04/08/18 1228 04/10/18 0554  WBC 4.5 6.1  NEUTROABS 3.1 4.3  HGB 12.6* 13.6  HCT 40.4 44.4  MCV 91.4 89.5  PLT 75* 83*   Basic Metabolic Panel: Recent Labs  Lab 04/08/18 1228 04/10/18 0554  NA 143 141  K 4.0 3.7  CL 110 108  CO2 28 27  GLUCOSE 114* 95  BUN 16 12  CREATININE 1.04 0.96  CALCIUM 9.0 9.0  PHOS  --  2.7   Liver Function Tests: Recent Labs  Lab 04/08/18 1228 04/10/18 0554  AST  15  --   ALT 14  --   ALKPHOS 76  --   BILITOT 0.6  --   PROT 6.0*  --   ALBUMIN 3.7 3.8   No results for input(s): LIPASE, AMYLASE in the last 168 hours. No results for input(s): AMMONIA in the last 168 hours. Cardiac Enzymes: Recent Labs  Lab 04/08/18 1228  TROPONINI <0.03   BNP (last 3 results) No results for input(s): BNP in the last 8760 hours.  ProBNP (last 3 results) No results  for input(s): PROBNP in the last 8760 hours.  CBG: No results for input(s): GLUCAP in the last 168 hours. No results found for this or any previous visit (from the past 240 hour(s)).   Studies: No results found.  Scheduled Meds: . aspirin EC  81 mg Oral Daily  . carbidopa-levodopa  2 tablet Oral QID  . cholecalciferol  1,000 Units Oral QHS  . clonazePAM  0.5 mg Oral QHS  . docusate sodium  100 mg Oral BID  . donepezil  10 mg Oral QHS  . enoxaparin (LOVENOX) injection  40 mg Subcutaneous Q24H  . mirabegron ER  50 mg Oral Daily  . polyethylene glycol  17 g Oral Daily  . sertraline  12.5 mg Oral Daily  . sodium chloride flush  3 mL Intravenous Q12H   Continuous Infusions: . sodium chloride      Time spent: 25 minutes. More than 50% of that time was spent in counseling and/or coordination of care.  Patrice Matthew  [facility] Pager [phone]  If 7PM-7AM, please contact night-coverage at [email or web address], password [facility] 04/11/2018, 12:24 PM

## 2018-04-12 LAB — CBC
HCT: 41 % (ref 39.0–52.0)
Hemoglobin: 12.9 g/dL — ABNORMAL LOW (ref 13.0–17.0)
MCH: 27.7 pg (ref 26.0–34.0)
MCHC: 31.5 g/dL (ref 30.0–36.0)
MCV: 88.2 fL (ref 80.0–100.0)
Platelets: 92 10*3/uL — ABNORMAL LOW (ref 150–400)
RBC: 4.65 MIL/uL (ref 4.22–5.81)
RDW: 12.8 % (ref 11.5–15.5)
WBC: 6.7 10*3/uL (ref 4.0–10.5)
nRBC: 0 % (ref 0.0–0.2)

## 2018-04-12 LAB — BASIC METABOLIC PANEL
Anion gap: 6 (ref 5–15)
BUN: 15 mg/dL (ref 8–23)
CALCIUM: 9.1 mg/dL (ref 8.9–10.3)
CHLORIDE: 107 mmol/L (ref 98–111)
CO2: 26 mmol/L (ref 22–32)
CREATININE: 1.07 mg/dL (ref 0.61–1.24)
GFR calc Af Amer: >60 mL/min (ref >60)
GFR calc non Af Amer: >60 mL/min (ref >60)
Glucose, Bld: 124 mg/dL — ABNORMAL HIGH (ref 70–99)
Potassium: 3.9 mmol/L (ref 3.5–5.1)
SODIUM: 139 mmol/L (ref 135–145)

## 2018-04-12 MED ORDER — HYDRALAZINE HCL 20 MG/ML IJ SOLN: 10.0000 mg | Freq: Four times a day (QID) | INTRAMUSCULAR | Status: DC | PRN

## 2018-04-12 NOTE — Progress Notes (Signed)
t refused IV access to be inserted. Refuses every other care

## 2018-04-12 NOTE — Consult Note (Signed)
[facility] CM Primary Care Navigator  04/12/2018  [name]. February 24, 1941 [id number]   Met withpatientand sister [name]) at the bedside toidentify possible discharge needs.  Sister reports that patient had "stiffness, difficulty ambulating with difficulty opening his eyes" that resulted to this admission. (ruled out TIA- transient ischemic attack, Parkinson's disease induced dementia)  Patient's daughterendorsesDr.John Laurann Holmes with [facility] care provider.   [facility] [place] [facility].  Patient's sister reportsthat his daughter has been managinghis medications at home with use of "pill box" system filled every 2 weeks.  His sister verbalized that patient's son [name]) and daughter [name]) have been providing transportation tohis doctors' appointments.  Sister mentioned that patient lives by himself but his son is the main person that assists him daily and serves as the primary caregiver for him. Patient also has a private pay caregiver who is assisting him with his care needs at home for 3 hours once a week.   Anticipated plan for discharge [name] per therapy recommendationprior to returning home.  Patient and sistervoiced understandingto callprimary careprovider'soffice whenhereturns backhomefor a post discharge follow-upvisitwithin1- 2 weeksor sooner if needs arise.Patient letter (with PCP's contact number) was providedasareminder.   Explained topatientand sister regarding [facility] CM services available for health managementandresourcesat home but denied anypressing needs orconcernsat thispoint. Patient's health needs/ issues at home is being managed by his son/ daughter. Patientand sister hadverbalizedunderstandingof needto seekreferral from primary care provider to [facility] care management  ifdeemed necessary and appropriatefor anyservicesin the future- oncehereturnsbackhome.   [facility] care management information was provided for futureneeds thathemay have.  Primary care provider's office is listed as providing transition of care (TOC) follow-up.   For additional questions please contact:  Edwena Felty A. Annelise Mccoy, BSN, RN-BC [facility] PRIMARY CARE Navigator Cell: [phone]

## 2018-04-12 NOTE — Progress Notes (Signed)
Physical Therapy Treatment Patient Details Name: [name]. MRN: [id number] DOB: [birth year] Today's Date: 04/12/2018    History of Present Illness 77 y.o. male TIA, status post TAVR, restless leg, Parkinson's disease with tremor stiffness bradykinesia (follows with Dr. Carles Collet)  kidney stones, heart murmur, aortic stenosis, diabetes.Marland Kitchen CVA work up underway. MRI - for CVA.     PT Comments    Pt in bed awake and alert with sister and daughter present. Pt able to participate more this session. Pt A&Ox 2- not aware of situation or place, but able to follow one step commands and in a cheerful mood. Pt participated in transfers, gait training and supine LE exercises. Pt able to stand from elevated surface with Mod A. Pt ambulated 20 feet with RW this session requiring Mod A+2 for safety and to prevent LOB. Pt ambulates with slow scissoring,shuffling gait. I have discussed the patient's current level of function related to deficits (see below) with the patient and daughter. They acknowledge understanding of this and do not feel the patient would be able to have their care needs met at home.  They are interested in post-acute rehab in an inpatient setting. Pt is progressing toward stated goals and would benefit from continued PT in order to maximize functional independence and increase overall strength. Pt remains appropriate for CIR based on current functional status.   Follow Up Recommendations  CIR     Equipment Recommendations  None recommended by PT    Recommendations for Other Services Rehab consult     Precautions / Restrictions Precautions Precautions: Fall Restrictions Weight Bearing Restrictions: No    Mobility  Bed Mobility Overal bed mobility: Needs Assistance Bed Mobility: Supine to Sit     Supine to sit: Mod assist     General bed mobility comments: using bed rail and HOB elevated . Pt needing assistance scooting bottom closer to EOB with mod cueing for hand  placement and increased time to initiate task  Transfers Overall transfer level: Needs assistance Equipment used: Rolling walker (2 wheeled) Transfers: Sit to/from Stand Sit to Stand: Mod assist;From elevated surface         General transfer comment: cues for hand placement. Assist to boost into standing and to stedy. Pt lacking hip extension.   Ambulation/Gait Ambulation/Gait assistance: Mod assist;+2 physical assistance Gait Distance (Feet): 20 Feet Assistive device: Rolling walker (2 wheeled) Gait Pattern/deviations: Decreased stride length;Trunk flexed;Step-to pattern;Narrow base of support;Shuffle;Decreased step length - left;Decreased stance time - right;Decreased step length - right;Decreased stance time - left;Scissoring     General Gait Details: Pt was able to participate in gait training this session. Pt requring Mod A +2 for safety and to maintain balance. VC required for foot placement, maintaining upright posture and keeping RW close.   Stairs             Wheelchair Mobility    Modified Rankin (Stroke Patients Only) Modified Rankin (Stroke Patients Only) Pre-Morbid Rankin Score: No significant disability Modified Rankin: Moderately severe disability     Balance Overall balance assessment: Needs assistance Sitting-balance support: Feet supported Sitting balance-Leahy Scale: Poor Sitting balance - Comments: pt able to reach across to touch an object midline with min guard for safety.    Standing balance support: Bilateral upper extremity supported;During functional activity Standing balance-Leahy Scale: Poor Standing balance comment: posterior lean upon standing but resolved once pt took steps with RW. Pt reliant on RW and external assist  Cognition Arousal/Alertness: Lethargic Behavior During Therapy: Flat affect Overall Cognitive Status: Impaired/Different from baseline Area of Impairment: Attention;Following  commands;Safety/judgement                   Current Attention Level: Selective   Following Commands: Follows one step commands inconsistently;Follows one step commands with increased time Safety/Judgement: Decreased awareness of safety;Decreased awareness of deficits     General Comments: Pt more alert this session making conversation and able to participate. Pt's sister and daughter present during session      Exercises Total Joint Exercises Ankle Circles/Pumps: AROM;Right;Left;Supine;15 reps Short Arc Quad: AROM;Right;Left;10 reps;Supine Heel Slides: 10 reps;Right;Left;AAROM;Supine;AROM(assist with L) Hip ABduction/ADduction: AROM;Right;Left;10 reps;Supine    General Comments General comments (skin integrity, edema, etc.): Pt lethargic this a.m. but after lunch pt awake and alert and able to participate.      Pertinent Vitals/Pain Pain Assessment: No/denies pain    Home Living                      Prior Function            PT Goals (current goals can now be found in the care plan section) Acute Rehab PT Goals Patient Stated Goal: get better PT Goal Formulation: With patient/family Time For Goal Achievement: 05/07/18 Potential to Achieve Goals: Good Progress towards PT goals: Progressing toward goals    Frequency    Min 2X/week      PT Plan Current plan remains appropriate    Co-evaluation              AM-PAC PT "6 Clicks" Mobility   Outcome Measure  Help needed turning from your back to your side while in a flat bed without using bedrails?: A Lot Help needed moving from lying on your back to sitting on the side of a flat bed without using bedrails?: A Lot Help needed moving to and from a bed to a chair (including a wheelchair)?: A Lot Help needed standing up from a chair using your arms (e.g., wheelchair or bedside chair)?: A Lot Help needed to walk in hospital room?: A Lot Help needed climbing 3-5 steps with a railing? : Total 6  Click Score: 11    End of Session Equipment Utilized During Treatment: Gait belt Activity Tolerance: Patient tolerated treatment well Patient left: in chair;with call bell/phone within reach;with chair alarm set;with family/visitor present Nurse Communication: Mobility status PT Visit Diagnosis: Unsteadiness on feet (R26.81);Muscle weakness (generalized) (M62.81)     Time: 2111-5520 PT Time Calculation (min) (ACUTE ONLY): 26 min  Charges:  $Gait Training: 8-22 mins $Therapeutic Exercise: 8-22 mins                     [address] SPTA   [place] 04/12/2018, 4:58 PM

## 2018-04-12 NOTE — Progress Notes (Signed)
BP = BP= 172/74, MAP = 103. Pt a little more confused. Page Alla Feeling on call

## 2018-04-12 NOTE — Progress Notes (Signed)
Patient refused PIV start by IV/Vas RN. Patient RN notified

## 2018-04-12 NOTE — Progress Notes (Addendum)
PROGRESS NOTE  [name]. QIW:[id number] DOB: [birth year] DOA: 04/08/2018 PCP: [facility]   LOS: 2 days   Brief narrative:  Patient is a 77 years old male with past medical history of Parkinson's disease in 2008 followed by neurology Dr. Carles Collet as outpatient, presented to the hospital with stiffness, difficulty ambulating with difficulty opening eyes and worsening bradykinesia.  Neurology had seen the patient and recommended inpatient work-up.  MRI of the brain was performed which did not show any acute findings.  There was some question of left facial droop so the patient was admitted for possible TIA. Patient gradually improved with increased dose of sinemet, currently being assessed for CIR.  Assessment/Plan:  Active Problems:   Parkinson's disease ([place])   S/P TAVR (transcatheter aortic valve replacement)   Dementia due to Parkinson's disease without behavioral disturbance (HCC)   TIA (transient ischemic attack)   Generalized weakness   Diastolic dysfunction   Thrombocytopenia (HCC)   Hypertension  TIA.  Ruled out.  MRI was negative.  Neurology on board.  Patient does have Parkinson's disease with bradykinesia.  Sinemet dose has been increased.  Improving gradually.  Parkinson's induced dementia.  Continue Zoloft and Klonopin, slow taper on discharge.  Chronic diastolic heart failure.  Not on diuretics.  We will closely monitor. Currently compensated.  Chronic kidney disease stage II-III.  Closely monitor BMP.  Overflow incontinence.  Continue Myrbetriq.  History of AS status post TAVR. Currently stable.   Constipation.  We will continue the patient on bowel regimen.  Moderate to severe malnutrition, present on admission  Continue nutritional support.  Generalized weakness and deconditioning. PT on board. Recommend CIR.   VTE Prophylaxis: Lovenox  Code Status:  Full code  Family Communication: I  Again spoke with the patient's daughter  at bedside.  Disposition Plan:  CIR versus SNF, depending on PT progress.   Consultants:  Neurology, rehab  Procedures:  None  Antibiotics:  None  Subjective:  Patient denies interval complains, no headache, dizziness, has weakness. Improved speech and eye opening.   Objective: Vitals:   04/12/18 0428 04/12/18 1117  BP: (!) 172/74 (!) 102/50  Pulse: 70 66  Resp: 17 16  Temp: 97.8 F (36.6 C) 97.7 F (36.5 C)  SpO2: 95% 99%    Intake/Output Summary (Last 24 hours) at 04/12/2018 1328 Last data filed at 04/11/2018 2100 Gross per 24 hour  Intake 120 ml  Output -  Net 120 ml   Filed Weights   04/08/18 1044 04/08/18 1932  Weight: 60 kg 58.9 kg   Physical Examination: General exam: Appears calm and comfortable ,Not in distress, drooped eyelids HEENT:PERRL,Oral mucosa moist Respiratory system: Bilateral equal air entry, normal vesicular breath sounds, no wheezes or crackles  Cardiovascular system: S1 & S2 heard, RRR.  Gastrointestinal system: Abdomen is nondistended, soft and nontender. No organomegaly or masses felt. Normal bowel sounds heard. Central nervous system: Alert awake and communicative.  Bradykinesia.  Mild tremors noted.  Generalized weakness noted.   Extremities: No edema, no clubbing ,no cyanosis, distal peripheral pulses palpable. Generalized weakness noted. Skin: No rashes, lesions or ulcers,no icterus ,no pallor    Data Review: I have personally reviewed the following laboratory data and studies,  CBC: Recent Labs  Lab 04/08/18 1228 04/10/18 0554 04/12/18 0739  WBC 4.5 6.1 6.7  NEUTROABS 3.1 4.3  --   HGB 12.6* 13.6 12.9*  HCT 40.4 44.4 41.0  MCV 91.4 89.5 88.2  PLT 75* 83* 92*  Basic Metabolic Panel: Recent Labs  Lab 04/08/18 1228 04/10/18 0554 04/12/18 0739  NA 143 141 139  K 4.0 3.7 3.9  CL 110 108 107  CO2 28 27 26   GLUCOSE 114* 95 124*  BUN 16 12 15   CREATININE 1.04 0.96 1.07  CALCIUM 9.0 9.0 9.1  PHOS  --  2.7  --      Liver Function Tests: Recent Labs  Lab 04/08/18 1228 04/10/18 0554  AST 15  --   ALT 14  --   ALKPHOS 76  --   BILITOT 0.6  --   PROT 6.0*  --   ALBUMIN 3.7 3.8   No results for input(s): LIPASE, AMYLASE in the last 168 hours. No results for input(s): AMMONIA in the last 168 hours. Cardiac Enzymes: Recent Labs  Lab 04/08/18 1228  TROPONINI <0.03   BNP (last 3 results) No results for input(s): BNP in the last 8760 hours.  ProBNP (last 3 results) No results for input(s): PROBNP in the last 8760 hours.  CBG: No results for input(s): GLUCAP in the last 168 hours. No results found for this or any previous visit (from the past 240 hour(s)).   Studies: No results found.  Scheduled Meds: . aspirin EC  81 mg Oral Daily  . carbidopa-levodopa  2 tablet Oral QID  . cholecalciferol  1,000 Units Oral QHS  . clonazePAM  0.5 mg Oral QHS  . docusate sodium  100 mg Oral BID  . donepezil  10 mg Oral QHS  . enoxaparin (LOVENOX) injection  40 mg Subcutaneous Q24H  . mirabegron ER  50 mg Oral Daily  . polyethylene glycol  17 g Oral Daily  . sertraline  12.5 mg Oral Daily  . sodium chloride flush  3 mL Intravenous Q12H   Continuous Infusions: . sodium chloride      Time spent: 25 minutes. More than 50% of that time was spent in counseling and/or coordination of care.  Nilza Eaker  [facility] Pager [phone]  If 7PM-7AM, please contact night-coverage at [email or web address], password [facility] 04/12/2018, 1:27 PM

## 2018-04-12 NOTE — Progress Notes (Signed)
PT Cancellation Note  Patient Details Name: [name]. MRN: [id number] DOB: 1940-06-30   Cancelled Treatment:    Reason Eval/Treat Not Completed: Fatigue/lethargy limiting ability to participate Pt asleep. Pt's daughter requested for PT to come back after lunch. Will follow up per POC.  Valerie Roys 04/12/2018, 11:37 AM

## 2018-04-13 MED ORDER — CIPROFLOXACIN HCL 0.3 % OP SOLN
1.0000 [drp] | OPHTHALMIC | Status: DC
  Administered 2018-04-13 – 2018-04-15 (×11): 1 [drp] via OPHTHALMIC
  Filled 2018-04-13: qty 2.5

## 2018-04-13 NOTE — Progress Notes (Signed)
PROGRESS NOTE    [name].  BPZ:[id number] DOB: 1940-06-19 DOA: 04/08/2018 PCP: [facility]   Brief Narrative: Patient is a 77 year old male with past medical history of Parkinson's disease being followed by neurology presented here with complaints of increased stiffness, difficulty ambulation and worsening bradykinesia.  Neurology has been following.  MRI was done to rule out a stroke which did not show any acute intracranial abnormalities.. Patient has gradually improved with increased dose of Sinemet.  PT/OT recommended CIR.  Awaiting evaluation.  Assessment & Plan:   Active Problems:   Parkinson's disease ([place])   S/P TAVR (transcatheter aortic valve replacement)   Dementia due to Parkinson's disease without behavioral disturbance (HCC)   TIA (transient ischemic attack)   Generalized weakness   Diastolic dysfunction   Thrombocytopenia (HCC)   Hypertension   Suspected TIA/stroke: Ruled out.  MRI was negative.  Neurology was following.  Parkinson's disease: On Sinemet, dose has been increased.  He will follow-up with his own neurologist,Dr Tat, as an outpatient.  Parkinson's induced dementia: Continue supportive care.  Continue Zoloft and Klonopin.  Chronic diastolic heart failure: Not on diuretics.  Currently compensated.  CKD stage III: Currently kidney function is on baseline  History of aortic stenosis: Status post TAVR.  Currently stable  Overflow incontinence: Continue Myrbetriq.  Generalized weakness/deconditioning: PT recommended CIR.  Serial consulted.  Constipation: Continue bowel regimen.  Moderate to severe malnutrition: Continue nutritional support      DVT prophylaxis: Lovenox Code Status: Full code Family Communication: Daughter present at the bed side Disposition Plan: CIR   Consultants: Neurology  Procedures:None  Antimicrobials:None  Subjective: Patient seen and examined the pressure this morning.   Remains comfortable.  No active issues currently.  Parkinson symptoms have been improving but significantly weak and lies most on the bed.  Objective: Vitals:   04/12/18 1930 04/12/18 2300 04/13/18 0342 04/13/18 0721  BP: (!) 120/54 (!) 101/56 107/61 (!) 153/84  Pulse: 75 71  68  Resp: 18 18 18 17   Temp: 97.9 F (36.6 C) 98 F (36.7 C) 97.7 F (36.5 C) (!) 97.5 F (36.4 C)  TempSrc:  Axillary Axillary Oral  SpO2: 97% 98% 98% 99%  Weight:      Height:        Intake/Output Summary (Last 24 hours) at 04/13/2018 1018 Last data filed at 04/13/2018 0955 Gross per 24 hour  Intake 120 ml  Output 550 ml  Net -430 ml   Filed Weights   04/08/18 1044 04/08/18 1932  Weight: 60 kg 58.9 kg    Examination:  General exam: Appears calm and comfortable ,Not in distress,thin built HEENT:PERRL,Oral mucosa moist, Ear/Nose normal on gross exam Respiratory system: Bilateral equal air entry, normal vesicular breath sounds, no wheezes or crackles  Cardiovascular system: S1 & S2 heard, RRR. No JVD, murmurs, rubs, gallops or clicks. No pedal edema. Gastrointestinal system: Abdomen is nondistended, soft and nontender. No organomegaly or masses felt. Normal bowel sounds heard. Central nervous system: Alert and oriented. No focal neurological deficits.  Bradykinesia, mild tremors, generalized weakness Extremities: No edema, no clubbing ,no cyanosis, distal peripheral pulses palpable. Skin: No rashes, lesions or ulcers,no icterus ,no pallor   Data Reviewed: I have personally reviewed following labs and imaging studies  CBC: Recent Labs  Lab 04/08/18 1228 04/10/18 0554 04/12/18 0739  WBC 4.5 6.1 6.7  NEUTROABS 3.1 4.3  --   HGB 12.6* 13.6 12.9*  HCT 40.4 44.4 41.0  MCV 91.4 89.5  88.2  PLT 75* 83* 92*   Basic Metabolic Panel: Recent Labs  Lab 04/08/18 1228 04/10/18 0554 04/12/18 0739  NA 143 141 139  K 4.0 3.7 3.9  CL 110 108 107  CO2 28 27 26   GLUCOSE 114* 95 124*  BUN 16 12 15     CREATININE 1.04 0.96 1.07  CALCIUM 9.0 9.0 9.1  PHOS  --  2.7  --    GFR: Estimated Creatinine Clearance: 48.2 mL/min (by C-G formula based on SCr of 1.07 mg/dL). Liver Function Tests: Recent Labs  Lab 04/08/18 1228 04/10/18 0554  AST 15  --   ALT 14  --   ALKPHOS 76  --   BILITOT 0.6  --   PROT 6.0*  --   ALBUMIN 3.7 3.8   No results for input(s): LIPASE, AMYLASE in the last 168 hours. No results for input(s): AMMONIA in the last 168 hours. Coagulation Profile: No results for input(s): INR, PROTIME in the last 168 hours. Cardiac Enzymes: Recent Labs  Lab 04/08/18 1228  TROPONINI <0.03   BNP (last 3 results) No results for input(s): PROBNP in the last 8760 hours. HbA1C: No results for input(s): HGBA1C in the last 72 hours. CBG: No results for input(s): GLUCAP in the last 168 hours. Lipid Profile: No results for input(s): CHOL, HDL, LDLCALC, TRIG, CHOLHDL, LDLDIRECT in the last 72 hours. Thyroid Function Tests: No results for input(s): TSH, T4TOTAL, FREET4, T3FREE, THYROIDAB in the last 72 hours. Anemia Panel: No results for input(s): VITAMINB12, FOLATE, FERRITIN, TIBC, IRON, RETICCTPCT in the last 72 hours. Sepsis Labs: No results for input(s): PROCALCITON, LATICACIDVEN in the last 168 hours.  No results found for this or any previous visit (from the past 240 hour(s)).       Radiology Studies: No results found.      Scheduled Meds: . aspirin EC  81 mg Oral Daily  . carbidopa-levodopa  2 tablet Oral QID  . cholecalciferol  1,000 Units Oral QHS  . clonazePAM  0.5 mg Oral QHS  . docusate sodium  100 mg Oral BID  . donepezil  10 mg Oral QHS  . enoxaparin (LOVENOX) injection  40 mg Subcutaneous Q24H  . mirabegron ER  50 mg Oral Daily  . polyethylene glycol  17 g Oral Daily  . sertraline  12.5 mg Oral Daily  . sodium chloride flush  3 mL Intravenous Q12H   Continuous Infusions: . sodium chloride       LOS: 3 days    Time spent: 35 mins.More than  50% of that time was spent in counseling and/or coordination of care.      Shelly Coss, MD [facility] Pager [phone]  If 7PM-7AM, please contact night-coverage [email or web address] Password [email or web address] 04/13/2018, 10:18 AM

## 2018-04-13 NOTE — Progress Notes (Signed)
Physical Therapy Treatment Patient Details Name: [name]. MRN: [id number] DOB: August 14, 1940 Today's Date: 04/13/2018    History of Present Illness 77 y.o. male TIA, status post TAVR, restless leg, Parkinson's disease with tremor stiffness bradykinesia (follows with Dr. Carles Collet)  kidney stones, heart murmur, aortic stenosis, diabetes.Marland Kitchen CVA work up underway. MRI - for CVA.     PT Comments    Patient seen for mobility progression. Pt is making progress toward PT goals and tolerated session well. This session focused on functional transfers and gait training. Gait training initially with RW and then with [facility] as this is the AD pt uses when at baseline. Pt requires mod A for balance. +2 for chair follow. Continue to recommend CIR for further skilled PT services to maximize independence and safety with mobility.   Follow Up Recommendations  CIR     Equipment Recommendations  None recommended by PT    Recommendations for Other Services Rehab consult     Precautions / Restrictions Precautions Precautions: Fall    Mobility  Bed Mobility Overal bed mobility: Needs Assistance Bed Mobility: Supine to Sit     Supine to sit: Min guard     General bed mobility comments: HOB elevated and use of rail; min guard for safety and increased time and effort; cues to follow through with task  Transfers Overall transfer level: Needs assistance Equipment used: None Transfers: Sit to/from Stand Sit to Stand: Mod assist         General transfer comment: assist for balance as pt has posterior bias upon standing; pt requires at least single UE support from AD and assist at trunk from therapist to maintain balance   Ambulation/Gait Ambulation/Gait assistance: Mod assist;+2 safety/equipment(chair follow) Gait Distance (Feet): (60 ft then 75 ft with seated break) Assistive device: Rolling walker (2 wheeled);Straight cane Gait Pattern/deviations: Decreased stride length;Trunk flexed;Narrow  base of support;Shuffle;Decreased step length - left;Decreased step length - right;Decreased dorsiflexion - right;Decreased dorsiflexion - left;Festinating Gait velocity: reduced   General Gait Details: initially RW used for gait training and then [facility]; pt requries external assistance from therapist for balance with either AD; cues for increased step lenths and wider BOS which pt is able to correct for brief time; mod A to maintain balance    Stairs             Wheelchair Mobility    Modified Rankin (Stroke Patients Only) Modified Rankin (Stroke Patients Only) Pre-Morbid Rankin Score: No significant disability Modified Rankin: Moderately severe disability     Balance Overall balance assessment: Needs assistance Sitting-balance support: Feet supported Sitting balance-Leahy Scale: Fair     Standing balance support: Bilateral upper extremity supported;During functional activity Standing balance-Leahy Scale: Poor Standing balance comment: retropulsion                            Cognition Arousal/Alertness: Awake/alert Behavior During Therapy: WFL for tasks assessed/performed Overall Cognitive Status: Within Functional Limits for tasks assessed                                 General Comments: pt joking with staff during session; pt is soft spoken and with stutter at times so pt is difficult to understand at times        Exercises      General Comments General comments (skin integrity, edema, etc.): sister present  Pertinent Vitals/Pain Pain Assessment: Faces Faces Pain Scale: No hurt Pain Intervention(s): Monitored during session    Home Living                      Prior Function            PT Goals (current goals can now be found in the care plan section) Progress towards PT goals: Progressing toward goals    Frequency    Min 2X/week      PT Plan Current plan remains appropriate    Co-evaluation               AM-PAC PT "6 Clicks" Mobility   Outcome Measure  Help needed turning from your back to your side while in a flat bed without using bedrails?: A Lot Help needed moving from lying on your back to sitting on the side of a flat bed without using bedrails?: A Lot Help needed moving to and from a bed to a chair (including a wheelchair)?: A Lot Help needed standing up from a chair using your arms (e.g., wheelchair or bedside chair)?: A Lot Help needed to walk in hospital room?: A Lot Help needed climbing 3-5 steps with a railing? : Total 6 Click Score: 11    End of Session Equipment Utilized During Treatment: Gait belt Activity Tolerance: Patient tolerated treatment well Patient left: in chair;with call bell/phone within reach;with chair alarm set;with family/visitor present Nurse Communication: Mobility status PT Visit Diagnosis: Unsteadiness on feet (R26.81);Muscle weakness (generalized) (M62.81)     Time: 3491-7915 PT Time Calculation (min) (ACUTE ONLY): 37 min  Charges:  $Gait Training: 23-37 mins                     Earney Navy, PTA Acute Rehabilitation Services Pager: [phone] Office: [phone]     Darliss Cheney 04/13/2018, 3:59 PM

## 2018-04-14 NOTE — Progress Notes (Signed)
PROGRESS NOTE    [name].  YIR:[id number] DOB: Jan 13, 1941 DOA: 04/08/2018 PCP: [facility]   Brief Narrative: Patient is a 77 year old male with past medical history of Parkinson's disease being followed by neurology presented here with complaints of increased stiffness, difficulty ambulation and worsening bradykinesia.  Neurology has been following.  MRI was done to rule out a stroke which did not show any acute intracranial abnormalities.. Patient has gradually improved with increased dose of Sinemet.  PT/OT recommended CIR.  Awaiting evaluation.  Assessment & Plan:   Active Problems:   Parkinson's disease ([place])   S/P TAVR (transcatheter aortic valve replacement)   Dementia due to Parkinson's disease without behavioral disturbance (HCC)   TIA (transient ischemic attack)   Generalized weakness   Diastolic dysfunction   Thrombocytopenia (HCC)   Hypertension   Suspected TIA/stroke: Ruled out.  MRI was negative.  Neurology was following.  Parkinson's disease: On Sinemet, dose has been increased.  He will follow-up with his own neurologist,Dr Tat, as an outpatient.  Parkinson's induced dementia: Continue supportive care.  Continue Zoloft and Klonopin.  Chronic diastolic heart failure: Not on diuretics.  Currently compensated.  CKD stage III: Currently kidney function is on baseline  History of aortic stenosis: Status post TAVR.  Currently stable  Overflow incontinence: Continue Myrbetriq.  Generalized weakness/deconditioning: PT recommended CIR.  Serial consulted.  Constipation: Continue bowel regimen.  Moderate to severe malnutrition: Continue nutritional support   Bilateral Conjunctivitis: Could  be viral but he has been started on Cipro has not drops empirically   DVT prophylaxis: Lovenox Code Status: Full code Family Communication: Daughter present at the bed side Disposition Plan: CIR   Consultants:  Neurology  Procedures:None  Antimicrobials:None  Subjective: Patient seen and examined the pressure this morning.  Remains comfortable.  No active issues currently.  Parkinson symptoms have been improving but significantly weak and lies most of the time  on the bed.  Objective: Vitals:   04/13/18 2024 04/14/18 0000 04/14/18 0341 04/14/18 0739  BP: (!) 119/51 (!) 116/56 125/60 117/65  Pulse: 66 77 71 65  Resp: 18 17 18 18   Temp: 98.5 F (36.9 C) 97.8 F (36.6 C) 98.2 F (36.8 C) 98.1 F (36.7 C)  TempSrc: Oral Oral Oral Oral  SpO2: 97% 98% 90% 99%  Weight:      Height:        Intake/Output Summary (Last 24 hours) at 04/14/2018 1029 Last data filed at 04/14/2018 0416 Gross per 24 hour  Intake 920 ml  Output 725 ml  Net 195 ml   Filed Weights   04/08/18 1044 04/08/18 1932  Weight: 60 kg 58.9 kg    Examination:  General exam: Appears calm and comfortable ,Not in distress,thin built HEENT:PERRL,Oral mucosa moist, Ear/Nose normal on gross exam Respiratory system: Bilateral equal air entry, normal vesicular breath sounds, no wheezes or crackles  Cardiovascular system: S1 & S2 heard, RRR. No JVD, murmurs, rubs, gallops or clicks. No pedal edema. Gastrointestinal system: Abdomen is nondistended, soft and nontender. No organomegaly or masses felt. Normal bowel sounds heard. Central nervous system: Alert and oriented. No focal neurological deficits.  Bradykinesia, mild tremors, generalized weakness Extremities: No edema, no clubbing ,no cyanosis, distal peripheral pulses palpable. Skin: No rashes, lesions or ulcers,no icterus ,no pallor   Data Reviewed: I have personally reviewed following labs and imaging studies  CBC: Recent Labs  Lab 04/08/18 1228 04/10/18 0554 04/12/18 0739  WBC 4.5 6.1 6.7  NEUTROABS 3.1  4.3  --   HGB 12.6* 13.6 12.9*  HCT 40.4 44.4 41.0  MCV 91.4 89.5 88.2  PLT 75* 83* 92*   Basic Metabolic Panel: Recent Labs  Lab 04/08/18 1228  04/10/18 0554 04/12/18 0739  NA 143 141 139  K 4.0 3.7 3.9  CL 110 108 107  CO2 28 27 26   GLUCOSE 114* 95 124*  BUN 16 12 15   CREATININE 1.04 0.96 1.07  CALCIUM 9.0 9.0 9.1  PHOS  --  2.7  --    GFR: Estimated Creatinine Clearance: 48.2 mL/min (by C-G formula based on SCr of 1.07 mg/dL). Liver Function Tests: Recent Labs  Lab 04/08/18 1228 04/10/18 0554  AST 15  --   ALT 14  --   ALKPHOS 76  --   BILITOT 0.6  --   PROT 6.0*  --   ALBUMIN 3.7 3.8   No results for input(s): LIPASE, AMYLASE in the last 168 hours. No results for input(s): AMMONIA in the last 168 hours. Coagulation Profile: No results for input(s): INR, PROTIME in the last 168 hours. Cardiac Enzymes: Recent Labs  Lab 04/08/18 1228  TROPONINI <0.03   BNP (last 3 results) No results for input(s): PROBNP in the last 8760 hours. HbA1C: No results for input(s): HGBA1C in the last 72 hours. CBG: No results for input(s): GLUCAP in the last 168 hours. Lipid Profile: No results for input(s): CHOL, HDL, LDLCALC, TRIG, CHOLHDL, LDLDIRECT in the last 72 hours. Thyroid Function Tests: No results for input(s): TSH, T4TOTAL, FREET4, T3FREE, THYROIDAB in the last 72 hours. Anemia Panel: No results for input(s): VITAMINB12, FOLATE, FERRITIN, TIBC, IRON, RETICCTPCT in the last 72 hours. Sepsis Labs: No results for input(s): PROCALCITON, LATICACIDVEN in the last 168 hours.  No results found for this or any previous visit (from the past 240 hour(s)).       Radiology Studies: No results found.      Scheduled Meds: . aspirin EC  81 mg Oral Daily  . carbidopa-levodopa  2 tablet Oral QID  . cholecalciferol  1,000 Units Oral QHS  . ciprofloxacin  1 drop Both Eyes Q4H while awake  . clonazePAM  0.5 mg Oral QHS  . docusate sodium  100 mg Oral BID  . donepezil  10 mg Oral QHS  . enoxaparin (LOVENOX) injection  40 mg Subcutaneous Q24H  . mirabegron ER  50 mg Oral Daily  . polyethylene glycol  17 g Oral Daily   . sertraline  12.5 mg Oral Daily   Continuous Infusions:    LOS: 4 days    Time spent: 35 mins.More than 50% of that time was spent in counseling and/or coordination of care.      Shelly Coss, MD [facility] Pager [phone]  If 7PM-7AM, please contact night-coverage [email or web address] Password [email or web address] 04/14/2018, 10:29 AM

## 2018-04-15 ENCOUNTER — Other Ambulatory Visit: Payer: Self-pay

## 2018-04-15 ENCOUNTER — Inpatient Hospital Stay (HOSPITAL_COMMUNITY)
Admission: RE | Admit: 2018-04-15 | Discharge: 2018-04-30 | DRG: 946 | Disposition: A | Discharge status: Home Health | Payer: Medicare Other | Source: Intra-hospital | Attending: Physical Medicine & Rehabilitation | Admitting: Physical Medicine & Rehabilitation

## 2018-04-15 ENCOUNTER — Encounter (HOSPITAL_COMMUNITY): Payer: Self-pay

## 2018-04-15 DIAGNOSIS — Z7982 Long term (current) use of aspirin: Secondary | ICD-10-CM | POA: Diagnosis not present

## 2018-04-15 DIAGNOSIS — Z8249 Family history of ischemic heart disease and other diseases of the circulatory system: Secondary | ICD-10-CM

## 2018-04-15 DIAGNOSIS — Z953 Presence of xenogenic heart valve: Secondary | ICD-10-CM | POA: Diagnosis not present

## 2018-04-15 DIAGNOSIS — R258 Other abnormal involuntary movements: Secondary | ICD-10-CM | POA: Diagnosis not present

## 2018-04-15 DIAGNOSIS — Z803 Family history of malignant neoplasm of breast: Secondary | ICD-10-CM | POA: Diagnosis not present

## 2018-04-15 DIAGNOSIS — Z825 Family history of asthma and other chronic lower respiratory diseases: Secondary | ICD-10-CM

## 2018-04-15 DIAGNOSIS — R5381 Other malaise: Principal | ICD-10-CM | POA: Diagnosis present

## 2018-04-15 DIAGNOSIS — R0989 Other specified symptoms and signs involving the circulatory and respiratory systems: Secondary | ICD-10-CM | POA: Diagnosis present

## 2018-04-15 DIAGNOSIS — F0280 Dementia in other diseases classified elsewhere without behavioral disturbance: Secondary | ICD-10-CM

## 2018-04-15 DIAGNOSIS — K5900 Constipation, unspecified: Secondary | ICD-10-CM | POA: Diagnosis present

## 2018-04-15 DIAGNOSIS — R471 Dysarthria and anarthria: Secondary | ICD-10-CM | POA: Diagnosis present

## 2018-04-15 DIAGNOSIS — R3981 Functional urinary incontinence: Secondary | ICD-10-CM | POA: Diagnosis not present

## 2018-04-15 DIAGNOSIS — Z8673 Personal history of transient ischemic attack (TIA), and cerebral infarction without residual deficits: Secondary | ICD-10-CM

## 2018-04-15 DIAGNOSIS — Z8042 Family history of malignant neoplasm of prostate: Secondary | ICD-10-CM

## 2018-04-15 DIAGNOSIS — D696 Thrombocytopenia, unspecified: Secondary | ICD-10-CM | POA: Diagnosis present

## 2018-04-15 DIAGNOSIS — Z833 Family history of diabetes mellitus: Secondary | ICD-10-CM | POA: Diagnosis not present

## 2018-04-15 DIAGNOSIS — N3281 Overactive bladder: Secondary | ICD-10-CM | POA: Diagnosis present

## 2018-04-15 DIAGNOSIS — K5901 Slow transit constipation: Secondary | ICD-10-CM

## 2018-04-15 DIAGNOSIS — D62 Acute posthemorrhagic anemia: Secondary | ICD-10-CM

## 2018-04-15 DIAGNOSIS — R482 Apraxia: Secondary | ICD-10-CM | POA: Diagnosis present

## 2018-04-15 DIAGNOSIS — D649 Anemia, unspecified: Secondary | ICD-10-CM | POA: Diagnosis present

## 2018-04-15 DIAGNOSIS — Z87442 Personal history of urinary calculi: Secondary | ICD-10-CM | POA: Diagnosis not present

## 2018-04-15 DIAGNOSIS — G20 Parkinson's disease: Secondary | ICD-10-CM | POA: Diagnosis present

## 2018-04-15 DIAGNOSIS — G479 Sleep disorder, unspecified: Secondary | ICD-10-CM | POA: Diagnosis not present

## 2018-04-15 LAB — CBC
HCT: 38.1 % — ABNORMAL LOW (ref 39.0–52.0)
Hemoglobin: 12 g/dL — ABNORMAL LOW (ref 13.0–17.0)
MCH: 28 pg (ref 26.0–34.0)
MCHC: 31.5 g/dL (ref 30.0–36.0)
MCV: 88.8 fL (ref 80.0–100.0)
Platelets: 106 10*3/uL — ABNORMAL LOW (ref 150–400)
RBC: 4.29 MIL/uL (ref 4.22–5.81)
RDW: 12.8 % (ref 11.5–15.5)
WBC: 5.6 10*3/uL (ref 4.0–10.5)
nRBC: 0 % (ref 0.0–0.2)

## 2018-04-15 LAB — CREATININE, SERUM
Creatinine, Ser: 1.08 mg/dL (ref 0.61–1.24)
Creatinine, Ser: 1.27 mg/dL — ABNORMAL HIGH (ref 0.61–1.24)
GFR calc non Af Amer: >60 mL/min (ref >60)
GFR calc non Af Amer: 54 mL/min — ABNORMAL LOW (ref >60)

## 2018-04-15 MED ORDER — ACETAMINOPHEN 325 MG PO TABS
650.0000 mg | ORAL_TABLET | Freq: Four times a day (QID) | ORAL | Status: DC | PRN
  Administered 2018-04-23 – 2018-04-24 (×2): 650 mg via ORAL
  Filled 2018-04-15 (×2): qty 2

## 2018-04-15 MED ORDER — CIPROFLOXACIN HCL 0.3 % OP SOLN: 1.0000 [drp] | OPHTHALMIC | 0 refills | Status: DC

## 2018-04-15 MED ORDER — CARBIDOPA-LEVODOPA 25-100 MG PO TABS
2.0000 | ORAL_TABLET | Freq: Four times a day (QID) | ORAL | Status: DC
  Administered 2018-04-15 – 2018-04-30 (×58): 2 via ORAL
  Filled 2018-04-15 (×58): qty 2

## 2018-04-15 MED ORDER — ACETAMINOPHEN 650 MG RE SUPP: 650.0000 mg | Freq: Four times a day (QID) | RECTAL | Status: DC | PRN

## 2018-04-15 MED ORDER — CIPROFLOXACIN HCL 0.3 % OP SOLN
1.0000 [drp] | OPHTHALMIC | Status: AC
  Administered 2018-04-15 – 2018-04-18 (×14): 1 [drp] via OPHTHALMIC
  Filled 2018-04-15: qty 2.5

## 2018-04-15 MED ORDER — SERTRALINE HCL 25 MG PO TABS
12.5000 mg | ORAL_TABLET | Freq: Every day | ORAL | Status: DC
  Administered 2018-04-16: 12.5 mg via ORAL
  Filled 2018-04-15: qty 0.5

## 2018-04-15 MED ORDER — ENOXAPARIN SODIUM 40 MG/0.4ML ~~LOC~~ SOLN
40.0000 mg | SUBCUTANEOUS | Status: DC
  Administered 2018-04-15 – 2018-04-29 (×15): 40 mg via SUBCUTANEOUS
  Filled 2018-04-15 (×15): qty 0.4

## 2018-04-15 MED ORDER — BISACODYL 10 MG RE SUPP: 10.0000 mg | Freq: Every day | RECTAL | Status: DC | PRN

## 2018-04-15 MED ORDER — ASPIRIN EC 81 MG PO TBEC
81.0000 mg | DELAYED_RELEASE_TABLET | Freq: Every day | ORAL | Status: DC
  Administered 2018-04-16 – 2018-04-30 (×15): 81 mg via ORAL
  Filled 2018-04-15 (×15): qty 1

## 2018-04-15 MED ORDER — SENNOSIDES-DOCUSATE SODIUM 8.6-50 MG PO TABS
1.0000 | ORAL_TABLET | Freq: Two times a day (BID) | ORAL | Status: DC
  Administered 2018-04-15: 1 via ORAL
  Filled 2018-04-15: qty 1

## 2018-04-15 MED ORDER — POLYETHYLENE GLYCOL 3350 17 G PO PACK
17.0000 g | PACK | Freq: Every day | ORAL | Status: DC
  Administered 2018-04-16 – 2018-04-29 (×5): 17 g via ORAL
  Filled 2018-04-15 (×10): qty 1

## 2018-04-15 MED ORDER — CLONAZEPAM 0.5 MG PO TABS
0.5000 mg | ORAL_TABLET | Freq: Every day | ORAL | Status: DC
  Administered 2018-04-15 – 2018-04-29 (×15): 0.5 mg via ORAL
  Filled 2018-04-15 (×15): qty 1

## 2018-04-15 MED ORDER — ONDANSETRON HCL 4 MG PO TABS: 4.0000 mg | ORAL_TABLET | Freq: Four times a day (QID) | ORAL | Status: DC | PRN

## 2018-04-15 MED ORDER — ENOXAPARIN SODIUM 40 MG/0.4ML ~~LOC~~ SOLN: 40.0000 mg | SUBCUTANEOUS | Status: DC

## 2018-04-15 MED ORDER — MIRABEGRON ER 25 MG PO TB24
50.0000 mg | ORAL_TABLET | Freq: Every day | ORAL | Status: DC
  Administered 2018-04-16 – 2018-04-30 (×14): 50 mg via ORAL
  Filled 2018-04-15 (×14): qty 2

## 2018-04-15 MED ORDER — POLYETHYLENE GLYCOL 3350 17 G PO PACK: 17.0000 g | PACK | Freq: Every day | ORAL | 0 refills | Status: DC

## 2018-04-15 MED ORDER — VITAMIN D 25 MCG (1000 UNIT) PO TABS
1000.0000 [IU] | ORAL_TABLET | Freq: Every day | ORAL | Status: DC
  Administered 2018-04-15 – 2018-04-29 (×15): 1000 [IU] via ORAL
  Filled 2018-04-15 (×15): qty 1

## 2018-04-15 MED ORDER — DONEPEZIL HCL 10 MG PO TABS
10.0000 mg | ORAL_TABLET | Freq: Every day | ORAL | Status: DC
  Administered 2018-04-15 – 2018-04-29 (×15): 10 mg via ORAL
  Filled 2018-04-15 (×15): qty 1

## 2018-04-15 MED ORDER — ONDANSETRON HCL 4 MG/2ML IJ SOLN: 4.0000 mg | Freq: Four times a day (QID) | INTRAMUSCULAR | Status: DC | PRN

## 2018-04-15 MED ORDER — SORBITOL 70 % SOLN
30.0000 mL | Freq: Every day | Status: DC | PRN
  Administered 2018-04-16: 30 mL via ORAL
  Filled 2018-04-15: qty 30

## 2018-04-15 MED ORDER — CARBIDOPA-LEVODOPA 25-100 MG PO TABS: 2.0000 | ORAL_TABLET | Freq: Four times a day (QID) | ORAL | Status: DC

## 2018-04-15 NOTE — IPOC Note (Signed)
Overall Plan of Care [facility]) Patient Details Name: [name]. MRN: [id number] DOB: Sep 19, 1940  Admitting Diagnosis: Parkinson's Disease  Hospital Problems: Active Problems:   Debility   Acute blood loss anemia   Bradykinesia     Functional Problem List: Nursing Bladder, Bowel, Medication Management, Motor, Nutrition, Safety, Perception, Endurance  PT Balance, Endurance, Motor, Safety, Perception  OT Balance, Cognition, Endurance, Motor, Safety  SLP Cognition  TR         Basic ADL's: OT Grooming, Bathing, Dressing, Eating, Toileting     Advanced  ADL's: OT       Transfers: PT Bed Mobility, Bed to Chair, Car, Furniture, Floor  OT Tub/Shower, Agricultural engineer: PT Stairs, Ambulation, Wheelchair Mobility     Additional Impairments: OT None  SLP Communication, Social Cognition comprehension, expression(due to cognition ) Problem Solving, Attention, Memory, Awareness  TR      Anticipated Outcomes Item Anticipated Outcome  Self Feeding independent  Swallowing      Basic self-care  supervision  Toileting  supervision   Bathroom Transfers supervision  Bowel/Bladder  maintain regular pattern of emptying bowel and bladder, avoid constipation  Transfers  supervision w/ LRAD  Locomotion  supervision w/ LRAD  Communication  Mod-Min A  Cognition  Min A  Pain  less than 2  Safety/Judgment  Patient will remain free of falls, skin breakdown and infection   Therapy Plan: PT Intensity: Minimum of 1-2 x/day ,45 to 90 minutes PT Frequency: 5 out of 7 days PT Duration Estimated Length of Stay: 12-14 days OT Intensity: Minimum of 1-2 x/day, 45 to 90 minutes OT Frequency: 5 out of 7 days OT Duration/Estimated Length of Stay: 12-14 days SLP Intensity: Minumum of 1-2 x/day, 30 to 90 minutes SLP Frequency: 3 to 5 out of 7 days SLP Duration/Estimated Length of Stay: 10-14 days     Team Interventions: Nursing Interventions Patient/Family Education,  Bladder Management, Bowel Management, Disease Management/Prevention, Pain Management, Cognitive Remediation/Compensation, Dysphagia/Aspiration Precaution Training, Discharge Planning, Psychosocial Support  PT interventions Visual/perceptual remediation/compensation, Psychosocial support, Therapeutic Activities, Functional mobility training, Discharge planning, Ambulation/gait training, Balance/vestibular training, Disease management/prevention, Neuromuscular re-education, Skin care/wound management, Therapeutic Exercise, Wheelchair propulsion/positioning, UE/LE Strength taining/ROM, Splinting/orthotics, Pain management, DME/adaptive equipment instruction, Cognitive remediation/compensation, Community reintegration, Technical sales engineer stimulation, Patient/family education, IT trainer, UE/LE Coordination activities  OT Interventions Training and development officer, Discharge planning, Neuromuscular re-education, Patient/family education, Self Care/advanced ADL retraining, Therapeutic Exercise, UE/LE Coordination activities, DME/adaptive equipment instruction, Functional mobility training, Therapeutic Activities, UE/LE Strength taining/ROM, Disease mangement/prevention, Community reintegration, Cognitive remediation/compensation  SLP Interventions Cognitive remediation/compensation, English as a second language teacher, Functional tasks, Speech/Language facilitation, Patient/family education  TR Interventions    SW/CM Interventions Discharge Planning, Psychosocial Support, Patient/Family Education   Barriers to Discharge MD  Medical stability and Cognition  Nursing Lack of/limited family support per sister;s report, daughter only available a few hours on weekends to assist patient. Sister concerned with goal to have patient home independent.   PT Medical stability    OT Decreased caregiver support    SLP      SW       Team Discharge Planning: Destination: PT-Home ,OT-   , SLP-Home Projected Follow-up: PT-Home  health PT, OT-   , SLP-24 hour supervision/assistance, Home Health SLP, Outpatient SLP Projected Equipment Needs: PT-To be determined, OT-  , SLP-None recommended by SLP Equipment Details: PT-has RW, cane, and transport chair already, OT-  Patient/family involved in discharge planning: PT- Patient, Family Midwife,  OT- , SLP-Patient, Family member/caregiver  MD ELOS: 13-16 days. Medical Rehab Prognosis:  Fair Assessment:  77 year old right-handed male with history of  headaches, aortic stenosis status post TAVR procedure , Parkinson's disease followed by neurology services Dr. Wells Guiles Holmes maintained on Sinemet/Aricept with baseline mild cognitive deficits. Presented 04/08/2018 with significant difficulty in gait with bradykinesia. Patient with history of lid opening apraxia with plans for Botox in the future. MRI of the brain reviewed, showing atrophy. Per report, cerebral atrophy with small hygromas without mass effect. Echocardiogram with ejection fraction of 62% grade 1 diastolic dysfunction. Carotid Dopplers with no ICA stenosis. He did have a KUB revealing large volume stool filling the entire colon. Neurology consulted with recommendations of adjusting his Sinemet.  Patient with resulting functional deficits with mobility, transfers, self-care, endurance.  We will set goals for Supervision with PT/OT and Min/Mod A with SLP.  See Team Conference Notes for weekly updates to the plan of care

## 2018-04-15 NOTE — Plan of Care (Addendum)
Patient stable, discussed POC with patient and daughter, agreeable with plan, denies question/concerns at this time.  

## 2018-04-15 NOTE — Care Management Important Message (Signed)
Important Message  Patient Details  Name: [name]. MRN: [id number] Date of Birth: 1941/04/15   Medicare Important Message Given:  Yes    Yahira Timberman 04/15/2018, 4:51 PM

## 2018-04-15 NOTE — Care Management Note (Signed)
Case Management Note  Patient Details  Name: [name]. MRN: [id number] Date of Birth: August 19, 1940  Subjective/Objective:                    Action/Plan: Pt is discharging to CIR. CM signing off.   Expected Discharge Date:  04/15/18               Expected Discharge Plan:  [place]  In-House Referral:     Discharge planning Services  CM Consult  Post Acute Care Choice:    Choice offered to:     DME Arranged:    DME Agency:     HH Arranged:    HH Agency:     Status of Service:  Completed, signed off  If discussed at [employer], dates discussed:    Additional Comments:  Pollie Friar, RN 04/15/2018, 1:11 PM

## 2018-04-15 NOTE — Progress Notes (Signed)
Jamse Arn, MD    Jamse Arn, MD  Physician  Physical Medicine and Rehabilitation      Consult Note  Signed     Date of Service:  04/10/2018 11:12 AM         Related encounter: ED to Hosp-Admission (Discharged) from 04/08/2018 in [facility]             Signed          Expand All Collapse All            Expand widget buttonCollapse widget button    Show:Clear all   ManualTemplateCopied  Added by:     Bary Leriche, PA-C  Jamse Arn, MD   Hover for detailscustomization button                                                                                                                                                                                       untitled image              Physical Medicine and Rehabilitation Consult        Reason for Consult: Functional deficits/Parkinson's disease  Referring Physician: Dr. Louanne Belton        HPI: [name]. is a 77 y.o. male with history of T2DM, headaches, aortic stenosis s/p TVAR, Parkinson's disease with baseline mild cognitive deficits; who was admitted on 04/08/2018 with 48-hour history of significant difficulty with gait with bradykinesia, difficulty opening his eyes and new left facial droop.  History taken from chart review and family.  Patient with history of lid opening apraxia (worsening due in the past due to constipation) with plans for Botox in the future and neurology question stroke as cause of lid apraxia.  MRI brain reviewed, showing atrophy.  Per report, cerebral atrophy with small hygromas without mass-effect.  2D echo done showing EF 60 to 65% with grade 1 diastolic dysfunction.  Carotid Dopplers were  negative for significant ICA stenosis.  KUB done revealing large volume stool filling entire colon. Neurology felt that patient without clinical cause of worsening and recommended increasing Sinemet to 2 tabs 4 times daily as well as management of constipation.  Therapy evaluations done revealing impairments in mobility, ADLs as well as cognition and speech.  CIR recommended for follow-up therapy        Review of Systems   Unable to perform ROS: Mental acuity           Past Medical History:    Diagnosis  Date    .   Anxiety        .   Aortic stenosis        .   Arthritis            left hip    .   Cancer ([place])            basal cell- facial?    .   Depression        .   Diabetes mellitus without complication ([place])        .   GERD (gastroesophageal reflux disease)        .   Headache(784.0)        .   Heart murmur        .   Hernia, inguinal, bilateral, recurrent        .   Kidney stones        .   Parkinson disease ([place])        .   Parkinson's disease (tremor, stiffness, slow motion, unstable posture) ([place])   12/11/2006    .   PONV (postoperative nausea and vomiting)        .   Restless leg syndrome        .   S/P TAVR (transcatheter aortic valve replacement)   11/18/2013        26 mm Edwards Sapien XT transcatheter heart valve placed via open right transfemoral approach    .   TIA (transient ischemic attack)   02/08/2007                Past Surgical History:    Procedure   Laterality   Date    .   CARDIAC CATHETERIZATION       10/09/13    .   cataract       2008        bilateral    .   COLONOSCOPY            .   CYSTOSCOPY/RETROGRADE/URETEROSCOPY       05/29/2011        Procedure: CYSTOSCOPY/RETROGRADE/URETEROSCOPY;  Surgeon: Malka So, MD;  Location: [facility];  Service: Urology;  Laterality: Left;  no stent      .   EYE SURGERY   Bilateral            /w IOL    .   INGUINAL HERNIA REPAIR   Bilateral   03/30/2014        Procedure: OPEN BILATERAL INGUINAL HERNIA REPAIR WITH MESH;  Surgeon: Coralie Keens, MD;  Location: [place];  Service: General;  Laterality: Bilateral;    .   INSERTION OF MESH   Bilateral   03/30/2014        Procedure: INSERTION OF MESH;  Surgeon: Coralie Keens, MD;  Location: [place];  Service: General;  Laterality: Bilateral;    .   INTRAOPERATIVE TRANSESOPHAGEAL ECHOCARDIOGRAM   N/A   11/18/2013        Procedure: INTRAOPERATIVE TRANSESOPHAGEAL ECHOCARDIOGRAM;  Surgeon: Sherren Mocha, MD;  Location[facility];  Service: Open Heart Surgery;  Laterality: N/A;    .   LEFT AND RIGHT HEART CATHETERIZATION WITH CORONARY ANGIOGRAM   N/A   10/09/2013        Procedure: LEFT AND RIGHT HEART CATHETERIZATION WITH CORONARY ANGIOGRAM;  Surgeon: Blane Ohara, MD;  Location: [facility] CATH LAB;  Service: Cardiovascular;  Laterality: N/A;    .   TRANSCATHETER  AORTIC VALVE REPLACEMENT, TRANSFEMORAL   N/A   11/18/2013        Procedure: TRANSCATHETER AORTIC VALVE REPLACEMENT, TRANSFEMORAL;  Surgeon: Sherren Mocha, MD;  Location: [place];  Service: Open Heart Surgery;  Laterality: N/A;                Family History    Problem   Relation   Age of Onset    .   Arthritis/Rheumatoid   Mother        .   Prostate cancer   Father        .   Anemia   Daughter        .   Asthma   Daughter        .   Diabetes   Daughter        .   Hypertension   Daughter        .   Kidney disease   Daughter        .   Cancer   Son        .   Breast cancer   Sister        .   Heart attack   Neg Hx        .   Stroke   Neg Hx              Social History:  Lives alone and independent with cane PTA. Has caregiver 3 hours a day and different family members check in  thorough out the day. " Sleeps a lot" per sister next door who helps with meals. Per reports that he has never smoked. He has never used smokeless tobacco. Per reports that he does not drink alcohol or use drugs.              Allergies    Allergen   Reactions    .   Penicillins                Unsure of reaction    .   Vicodin [Hydrocodone-Acetaminophen]   Other (See Comments)            "Mental reaction"              Medications Prior to Admission    Medication   Sig   Dispense   Refill    .   acetaminophen (TYLENOL) 500 MG tablet   Take 1,000 mg by mouth 2 (two) times daily as needed (pain). For pain            .   aspirin EC 81 MG tablet   Take 1 tablet (81 mg total) by mouth daily.            .   carbidopa-levodopa (SINEMET IR) 25-100 MG tablet   Take 2 tablets by mouth 3 (three) times daily.   540 tablet   1    .   cholecalciferol (VITAMIN D) 1000 UNITS tablet   Take 1,000 Units by mouth at bedtime.             .   clonazePAM (KLONOPIN) 0.5 MG tablet   TAKE 1 TABLET (0.5 MG TOTAL) BY MOUTH AT BEDTIME.   90 tablet   1    .   donepezil (ARICEPT) 10 MG tablet   Take 1 tablet (10 mg total) by mouth at bedtime.   90 tablet   1    .   MYRBETRIQ 50 MG TB24 tablet  Take 50 mg by mouth daily.       11    .   sertraline (ZOLOFT) 25 MG tablet   Take 12.5 mg by mouth daily.                   Home:  Home Living  Family/patient expects to be discharged to:: Private residence  Available Help at Discharge: Available PRN/intermittently  Type of Home: House  Home Access: Stairs to enter  [employer] of Steps: ramp entry  Entrance Stairs-Rails: Left  Home Layout: Two level, Able to live on main level with bedroom/bathroom  Bathroom Shower/Tub: Tourist information centre manager: Standard  Bathroom Accessibility: No  Home Equipment: Foard - single  point, Environmental consultant - 2 wheels, Bedside commode, Grab bars - tub/shower, Transport chair   Lives With: Other (Comment)(caregivers and family)   Functional History:  Prior Function  Level of Independence: Independent with assistive device(s)  Comments: uses a cane in the house most of the time; in the community use the can as well  Functional Status:   Mobility:  Bed Mobility  Overal bed mobility: Needs Assistance  Bed Mobility: Supine to Sit, Sit to Supine  Supine to sit: Mod assist  Sit to supine: Mod assist  General bed mobility comments: using bed rail and HOB elevated to get up  Transfers  Overall transfer level: Needs assistance  Equipment used: 1 person hand held assist  Transfers: Sit to/from Stand  Sit to Stand: Mod assist  Stand pivot transfers: Mod assist  General transfer comment: cues for hand placement  Ambulation/Gait  Ambulation/Gait assistance: Mod assist  Gait Distance (Feet): 5 Feet  Assistive device: 1 person hand held assist, Rolling walker (2 wheeled)  Gait Pattern/deviations: Decreased stride length, Trunk flexed, Step-to pattern, Narrow base of support  General Gait Details: pt demonstrates good awareness of the task but PLOF was mod I on SPC and now is lethargic with eyes closed  Gait velocity: reduced  Gait velocity interpretation: <1.8 ft/sec, indicate of risk for recurrent falls       ADL:  ADL  Overall ADL's : Needs assistance/impaired  Eating/Feeding: Maximal assistance  Eating/Feeding Details (indicate cue type and reason): daughter feeding pt; appesr having difficulty keeping eyes open; bringing ofood to mouth that is not there  Grooming: Maximal assistance  Upper Body Bathing: Maximal assistance, Sitting  Lower Body Bathing: Maximal assistance, Sit to/from stand  Upper Body Dressing : Maximal assistance, Sitting  Lower Body Dressing: Maximal assistance, Sit to/from stand  Lower Body Dressing Details (indicate  cue type and reason): pt able to lean forward to touch socks and begin pulling socks up; appears to perseverate on task  Toilet Transfer: Maximal assistance  Toilet Transfer Details (indicate cue type and reason): simulated  Toileting- Clothing Manipulation and Hygiene: Maximal assistance  Functional mobility during ADLs: Maximal assistance, +2 for safety/equipment     Cognition:  Cognition  Overall Cognitive Status: Impaired/Different from baseline  Arousal/Alertness: Lethargic  Orientation Level: Oriented to person, Oriented to place, Disoriented to time, Disoriented to situation  Attention: Sustained  Sustained Attention: Impaired  Sustained Attention Impairment: Verbal basic, Functional basic(kept eyes closed during assessment)  Memory: Impaired  Memory Impairment: Retrieval deficit, Decreased recall of new information, Decreased short term memory  Decreased Short Term Memory: Verbal basic, Functional basic  Awareness: Appears intact  Problem Solving: Impaired  Problem Solving Impairment: Verbal basic, Functional basic  Executive Function: Organizing, Decision Making  Organizing: Impaired  Organizing Impairment:  Verbal basic, Functional basic  Decision Making: Impaired  Decision Making Impairment: Verbal basic, Functional basic  Behaviors: Perseveration  Safety/Judgment: Other (comment)(DTA)  Cognition  Arousal/Alertness: Lethargic  Behavior During Therapy: Flat affect, Restless  Overall Cognitive Status: Impaired/Different from baseline  Area of Impairment: Attention, Following commands, Safety/judgement  Current Attention Level: Selective  Following Commands: Follows one step commands inconsistently, Follows one step commands with increased time  Safety/Judgement: Decreased awareness of safety, Decreased awareness of deficits  General Comments: pt is confused and does not follow instructions to wait for assistance, using HHA to ambulate due  to his confusion with sitter        Blood pressure (!) 152/89, pulse 79, temperature 98.2 F (36.8 C), temperature source Oral, resp. rate 17, height 5\' 6"  (1.676 m), weight 58.9 kg, SpO2 100 %.  Physical Exam   Nursing note and vitals reviewed.  Constitutional: He appears well-developed. He appears lethargic. He appears cachectic. No distress.  Frail appearing elderly male- up in chair leaning over to right.    HENT:   Head: Normocephalic and atraumatic.   Eyes: Right eye exhibits no discharge. Left eye exhibits no discharge.  Left medial occular hemorrhage    Neck: Normal range of motion. Neck supple.   Cardiovascular: Normal rate and regular rhythm.   Respiratory: Effort normal and breath sounds normal.   GI: Soft. Bowel sounds are normal.  Musculoskeletal:  No edema or tenderness in extremities  Neurological: He appears lethargic.  Expressive aphasia Exteremly delayed but was able to state name as JR. Disoriented with mumbling speech and question hallucinating--asking about tractor that was here.  Severe lean to the right (chronic per family).   Bilateral ptosis but able to open eyes with cues and interact.  Bradykinesia with extensor tone LLE.  Motor: Right upper extremity 4-5 proximal distal apraxia Left upper extremity small distal Right lower extremity: Hip flexion, knee extension 3+/5, ankle dorsiflexion 4/5 Left lower extremity: Hip flexion, knee extension 4/5, ankle dorsal flexion 4+/5   Skin: Skin is warm and dry. He is not diaphoretic.  Psychiatric: His affect is blunt. His speech is delayed and slurred. He is slowed.         Lab Results Last 24 Hours  Imaging Results (Last 48 hours)                                                  Assessment/Plan:  Diagnosis: Debility  Labs and images (see above) independently reviewed.  Records reviewed and summated above.     1.Does the need for close, 24 hr/day medical supervision in concert with the patient's rehab needs make it unreasonable for this patient to be served in a less intensive setting? Potentially    2.Co-Morbidities requiring supervision/potential complications: diastolic dysfunction (monitor for signs and symptoms of fluid overload), T2 DM (Monitor in accordance with exercise and adjust meds as necessary), headaches, aortic stenosis s/p TVAR (Monitor in accordance with increased physical activity and avoid UE resistance excercises), Parkinson's disease with baseline mild cognitive deficits (continue meds per neurology), HTN (monitor and provide prns in accordance with increased physical exertion and pain), Thrombocytopenia (< 60,000/mm3 no resistive exercise)   3.Due to bladder management, safety, disease management, medication administration and patient education, does the patient require 24 hr/day rehab nursing? Yes   4.Does the patient require coordinated care of a physician, rehab nurse, PT (1-2 hrs/day, 5 days/week), OT (1-2 hrs/day, 5 days/week) and SLP (1-2 hrs/day, 5 days/week) to  address physical and functional deficits in the context of the above medical diagnosis(es)? Yes Addressing deficits in the following areas: balance, endurance, locomotion, strength, transferring, bathing, dressing, toileting, cognition, speech and psychosocial support   5.Can the patient actively participate in an intensive therapy program of at least 3 hrs of therapy per day at least 5 days per week? Potentially   6.The potential for patient to make measurable gains while on inpatient rehab is good   7.Anticipated functional outcomes upon discharge from inpatient rehab are min assist  with PT, min assist with OT, min assist and mod assist with SLP.   8.Estimated rehab length of stay to reach the above functional goals is: 17-20 days.   9.Anticipated D/C setting: SNF   10.Anticipated post D/C treatments: SNF   11.Overall Rehab/Functional Prognosis: good and fair      RECOMMENDATIONS:  This patient's condition is appropriate for continued rehabilitative care in the following setting: Will monitor for progress with increase in Sinemet.  If no significant improvement with change in medication may require further work-up and/or SNF placement.  Patient has agreed to participate in recommended program. Potentially  Note that insurance prior authorization may be required for reimbursement for recommended care.     Comment: Rehab Admissions Coordinator to follow up.        I have personally performed a face to face diagnostic evaluation, including, but not limited to relevant history and physical exam findings, of this patient and developed relevant assessment and plan.  Additionally, I have reviewed and concur with the physician assistant's documentation above.      Delice Lesch, MD, ABPMR  Bary Leriche, PA-C  04/10/2018                Revision History                                        Routing History

## 2018-04-15 NOTE — Progress Notes (Signed)
Charge RN and RN fetched patient from 3W, family accompanied to new room. Oriented to unit and in no distress. Patient resting with call bell in place.

## 2018-04-15 NOTE — Discharge Summary (Signed)
Physician Discharge Summary  [name]. HMC:[id number] DOB: January 22, 1941 DOA: 04/08/2018  PCP: [facility]  Admit date: 04/08/2018 Discharge date: 04/15/2018  Admitted From: Home Disposition:  CIR  Discharge Condition:Stable CODE STATUS:FULL, DNR, Comfort Care Diet recommendation: Heart Healthy  Brief/Interim Summary: Patient is a 77 year old male with past medical history of Parkinson's disease being followed by neurology presented here with complaints of increased stiffness, difficulty ambulation and worsening bradykinesia.  Neurology has been following here.  MRI was done to rule out a stroke which did not show any acute intracranial abnormalities. Patient has gradually improved with increased dose of Sinemet.  PT/OT recommended CIR.  He is stable to be transferred to CIR today.  Following problems were addressed during his hospitalization:   Suspected TIA/stroke: Ruled out.  MRI was negative.  Neurology was following.  Parkinson's disease: On Sinemet, dose has been increased to 4 times daily.  He will follow-up with his own neurologist,Dr Tat, as an outpatient.  Parkinson's induced dementia: Continue supportive care.  Continue Zoloft and Klonopin.  Chronic diastolic heart failure: Not on diuretics.  Currently compensated.  CKD stage III: Currently kidney function is on baseline  History of aortic stenosis: Status post TAVR.  Currently stable  Overflow incontinence: Continue Myrbetriq.  Generalized weakness/deconditioning: PT recommended CIR.    Constipation: Continue bowel regimen.  Moderate to severe malnutrition: Continue nutritional support   Bilateral Conjunctivitis: Could  be viral but he has been started on Cipro has not drops empirically.Plan is to continue for total of 5 days.   Discharge Diagnoses:  Active Problems:   Parkinson's disease ([place])   S/P TAVR (transcatheter aortic valve replacement)   Dementia due to  Parkinson's disease without behavioral disturbance (HCC)   TIA (transient ischemic attack)   Generalized weakness   Diastolic dysfunction   Thrombocytopenia (HCC)   Hypertension    Discharge Instructions  Discharge Instructions    Diet - low sodium heart healthy   Complete by:  As directed    Discharge instructions   Complete by:  As directed    1) Take prescribed medications as instructed. 2)Follow up with your neurologist after discharge from inpatient rehab.   Increase activity slowly   Complete by:  As directed      Allergies as of 04/15/2018      Reactions   Penicillins    Unsure of reaction   Vicodin [hydrocodone-acetaminophen] Other (See Comments)   "Mental reaction"      Medication List    TAKE these medications   acetaminophen 500 MG tablet Commonly known as:  TYLENOL Take 1,000 mg by mouth 2 (two) times daily as needed (pain). For pain   aspirin EC 81 MG tablet Take 1 tablet (81 mg total) by mouth daily.   carbidopa-levodopa 25-100 MG tablet Commonly known as:  SINEMET IR Take 2 tablets by mouth 4 (four) times daily. What changed:  when to take this   cholecalciferol 1000 units tablet Commonly known as:  VITAMIN D Take 1,000 Units by mouth at bedtime.   ciprofloxacin 0.3 % ophthalmic solution Commonly known as:  CILOXAN Place 1 drop into both eyes every 4 (four) hours while awake. Administer 1 drop, every 2 hours, while awake, for 2 days. Then 1 drop, every 4 hours, while awake, for the next 5 days.   clonazePAM 0.5 MG tablet Commonly known as:  KLONOPIN TAKE 1 TABLET (0.5 MG TOTAL) BY MOUTH AT BEDTIME.   donepezil 10 MG tablet Commonly known  as:  ARICEPT Take 1 tablet (10 mg total) by mouth at bedtime.   MYRBETRIQ 50 MG Tb24 tablet Generic drug:  mirabegron ER Take 50 mg by mouth daily.   polyethylene glycol packet Commonly known as:  MIRALAX / GLYCOLAX Take 17 g by mouth daily. Start taking on:  04/16/2018   sertraline 25 MG  tablet Commonly known as:  ZOLOFT Take 12.5 mg by mouth daily.       Allergies  Allergen Reactions  . Penicillins     Unsure of reaction  . Vicodin [Hydrocodone-Acetaminophen] Other (See Comments)    "Mental reaction"    Consultations: neurology  Procedures/Studies: Dg Abdomen 1 View  Result Date: 04/08/2018 CLINICAL DATA:  Altered mental status. EXAM: ABDOMEN - 1 VIEW COMPARISON:  11/16/2017 FINDINGS: Formed stool fills the entire colon. No dilated small bowel. Pelvic calcifications. No concerning mass effect or calcification. IMPRESSION: Large volume, generalized stool retention. Electronically Signed   By: Monte Fantasia M.D.   On: 04/08/2018 15:28   Ct Head Wo Contrast  Result Date: 04/08/2018 CLINICAL DATA:  Altered mental status EXAM: CT HEAD WITHOUT CONTRAST TECHNIQUE: Contiguous axial images were obtained from the base of the skull through the vertex without intravenous contrast. COMPARISON:  11/09/2017 FINDINGS: Brain: Small remote lacunar infarct of the right external capsule. Otherwise, the brainstem, cerebellum, cerebral peduncles, thalami, basal ganglia, basilar cisterns, and ventricular system appear within normal limits. No intracranial hemorrhage, mass lesion, or acute CVA. Vascular: There is atherosclerotic calcification of the cavernous carotid arteries bilaterally. Skull: Unremarkable Sinuses/Orbits: Chronic right maxillary, bilateral ethmoid, left sphenoid, and left frontal sinusitis. Other: Unremarkable IMPRESSION: 1. No acute intracranial findings. 2. Chronic paranasal sinusitis. 3. Small remote lacunar infarct of the right external capsule. Electronically Signed   By: Van Clines M.D.   On: 04/08/2018 12:51   Mr Brain Wo Contrast (neuro Protocol)  Result Date: 04/08/2018 CLINICAL DATA:  Focal neuro deficit with stroke suspected. Altered mental status EXAM: MRI HEAD WITHOUT CONTRAST TECHNIQUE: Multiplanar, multiecho pulse sequences of the brain and  surrounding structures were obtained without intravenous contrast. COMPARISON:  Head CT from earlier today.  Brain MRI 02/09/2007 FINDINGS: Brain: Brain atrophy, most notable in the posterior cerebral regions where there is atrial dilatation, progressed from 2008. Small CSF accumulation is along the interhemispheric fissure and left parietal convexity that are new from 2008 and consistent with hygromas. No significant mass effect. Small remote bilateral cerebellar infarcts. Vascular: Major flow voids are preserved Skull and upper cervical spine: Negative for marrow lesion Sinuses/Orbits: Bilateral cataract resection. Mild ethmoid mucosal thickening. IMPRESSION: 1. No acute finding. 2. Cerebral atrophy, central predominant, that is progressed from 2008. 3. Small hygromas along the interhemispheric fissure and left parietal convexity without significant mass effect. Electronically Signed   By: Monte Fantasia M.D.   On: 04/08/2018 16:45   Vas US Carotid (at [place] Only)  Result Date: 04/09/2018 Carotid Arterial Duplex Study Indications: TIA. Performing Technologist: Abram Sander RVS  Examination Guidelines: A complete evaluation includes B-mode imaging, spectral Doppler, color Doppler, and power Doppler as needed of all accessible portions of each vessel. Bilateral testing is considered an integral part of a complete examination. Limited examinations for reoccurring indications may be performed as noted.  Right Carotid Findings: +----------+--------+--------+--------+-----------+--------+           PSV cm/sEDV cm/sStenosisDescribe   Comments +----------+--------+--------+--------+-----------+--------+ CCA Prox  71      11  homogeneous         +----------+--------+--------+--------+-----------+--------+ CCA Distal57      10              homogeneous         +----------+--------+--------+--------+-----------+--------+ ICA Prox  32      10      1-39%   homogeneous          +----------+--------+--------+--------+-----------+--------+ ICA Distal67      22                                  +----------+--------+--------+--------+-----------+--------+ ECA       65      10                                  +----------+--------+--------+--------+-----------+--------+ +----------+--------+-------+--------+-------------------+           PSV cm/sEDV cmsDescribeArm Pressure (mmHG) +----------+--------+-------+--------+-------------------+ Subclavian80                                         +----------+--------+-------+--------+-------------------+ +---------+--------+--+--------+--+---------+ VertebralPSV cm/s35EDV cm/s12Antegrade +---------+--------+--+--------+--+---------+  Left Carotid Findings: +----------+--------+--------+--------+-----------+--------+           PSV cm/sEDV cm/sStenosisDescribe   Comments +----------+--------+--------+--------+-----------+--------+ CCA Prox  73      11              homogeneous         +----------+--------+--------+--------+-----------+--------+ CCA Distal69      14              homogeneous         +----------+--------+--------+--------+-----------+--------+ ICA Prox  48      14      1-39%   homogeneous         +----------+--------+--------+--------+-----------+--------+ ICA Distal69      16                                  +----------+--------+--------+--------+-----------+--------+ ECA       81                                          +----------+--------+--------+--------+-----------+--------+ +----------+--------+--------+--------+-------------------+ SubclavianPSV cm/sEDV cm/sDescribeArm Pressure (mmHG) +----------+--------+--------+--------+-------------------+           76                                          +----------+--------+--------+--------+-------------------+ +---------+--------+--+--------+-+---------+ VertebralPSV cm/s33EDV cm/s9Antegrade  +---------+--------+--+--------+-+---------+  Summary: Right Carotid: Velocities in the right ICA are consistent with a 1-39% stenosis. Left Carotid: Velocities in the left ICA are consistent with a 1-39% stenosis. Vertebrals: Bilateral vertebral arteries demonstrate antegrade flow. *See table(s) above for measurements and observations.  Electronically signed by Antony Contras MD on 04/09/2018 at 2:26:17 PM.    Final        Subjective: Patient seen and examined at bedside this morning.  No new change from yesterday.  Hemodynamically stable.  Stable for transfer to CIR today.  Discharge Exam: Vitals:   04/15/18 0400 04/15/18 0732  BP: (!) 122/56 Marland Kitchen)  146/77  Pulse: 67 67  Resp: 15 15  Temp: 98 F (36.7 C) 98.6 F (37 C)  SpO2: 99% 99%   Vitals:   04/14/18 1926 04/14/18 2357 04/15/18 0400 04/15/18 0732  BP: 113/61 132/67 (!) 122/56 (!) 146/77  Pulse: 66 68 67 67  Resp: 18 16 15 15   Temp: 98 F (36.7 C) 97.8 F (36.6 C) 98 F (36.7 C) 98.6 F (37 C)  TempSrc: Oral Oral Oral Oral  SpO2: 99% 98% 99% 99%  Weight:      Height:        General: Pt is alert, awake, not in acute distress Cardiovascular: RRR, S1/S2 +, no rubs, no gallops Respiratory: CTA bilaterally, no wheezing, no rhonchi Abdominal: Soft, NT, ND, bowel sounds + Extremities: no edema, no cyanosis Neuro: Bradykinesia, rigidity, dementia    The results of significant diagnostics from this hospitalization (including imaging, microbiology, ancillary and laboratory) are listed below for reference.     Microbiology: No results found for this or any previous visit (from the past 240 hour(s)).   Labs: BNP (last 3 results) No results for input(s): BNP in the last 8760 hours. Basic Metabolic Panel: Recent Labs  Lab 04/08/18 1228 04/10/18 0554 04/12/18 0739 04/15/18 0437  NA 143 141 139  --   K 4.0 3.7 3.9  --   CL 110 108 107  --   CO2 28 27 26   --   GLUCOSE 114* 95 124*  --   BUN 16 12 15   --   CREATININE  1.04 0.96 1.07 1.08  CALCIUM 9.0 9.0 9.1  --   PHOS  --  2.7  --   --    Liver Function Tests: Recent Labs  Lab 04/08/18 1228 04/10/18 0554  AST 15  --   ALT 14  --   ALKPHOS 76  --   BILITOT 0.6  --   PROT 6.0*  --   ALBUMIN 3.7 3.8   No results for input(s): LIPASE, AMYLASE in the last 168 hours. No results for input(s): AMMONIA in the last 168 hours. CBC: Recent Labs  Lab 04/08/18 1228 04/10/18 0554 04/12/18 0739  WBC 4.5 6.1 6.7  NEUTROABS 3.1 4.3  --   HGB 12.6* 13.6 12.9*  HCT 40.4 44.4 41.0  MCV 91.4 89.5 88.2  PLT 75* 83* 92*   Cardiac Enzymes: Recent Labs  Lab 04/08/18 1228  TROPONINI <0.03   BNP: Invalid input(s): POCBNP CBG: No results for input(s): GLUCAP in the last 168 hours. D-Dimer No results for input(s): DDIMER in the last 72 hours. Hgb A1c No results for input(s): HGBA1C in the last 72 hours. Lipid Profile No results for input(s): CHOL, HDL, LDLCALC, TRIG, CHOLHDL, LDLDIRECT in the last 72 hours. Thyroid function studies No results for input(s): TSH, T4TOTAL, T3FREE, THYROIDAB in the last 72 hours.  Invalid input(s): FREET3 Anemia work up No results for input(s): VITAMINB12, FOLATE, FERRITIN, TIBC, IRON, RETICCTPCT in the last 72 hours. Urinalysis    Component Value Date/Time   COLORURINE YELLOW 04/08/2018 1330   APPEARANCEUR CLEAR 04/08/2018 1330   LABSPEC 1.025 04/08/2018 1330   PHURINE 5.0 04/08/2018 1330   GLUCOSEU NEGATIVE 04/08/2018 1330   HGBUR NEGATIVE 04/08/2018 1330   BILIRUBINUR NEGATIVE 04/08/2018 1330   KETONESUR NEGATIVE 04/08/2018 1330   PROTEINUR NEGATIVE 04/08/2018 1330   UROBILINOGEN 1.0 11/17/2013 1038   NITRITE NEGATIVE 04/08/2018 1330   LEUKOCYTESUR NEGATIVE 04/08/2018 1330   Sepsis Labs Invalid input(s): PROCALCITONIN,  WBC,  LACTICIDVEN  Microbiology No results found for this or any previous visit (from the past 240 hour(s)).  Please note: You were cared for by a hospitalist during your hospital stay.  Once you are discharged, your primary care physician will handle any further medical issues. Please note that NO REFILLS for any discharge medications will be authorized once you are discharged, as it is imperative that you return to your primary care physician (or establish a relationship with a primary care physician if you do not have one) for your post hospital discharge needs so that they can reassess your need for medications and monitor your lab values.    Time coordinating discharge: 40 minutes  SIGNED:   Shelly Coss, MD  [facility] 04/15/2018, 12:13 PM Pager [phone]  If 7PM-7AM, please contact night-coverage [email or web address] Password [email or web address]

## 2018-04-15 NOTE — Progress Notes (Signed)
Bryce Gong, RN  Rehab Admission Coordinator  Physical Medicine and Rehabilitation  PMR Pre-admission  Signed  Date of Service:  04/15/2018 12:38 PM       Related encounter: ED to Hosp-Admission (Discharged) from 04/08/2018 in [facility]      Signed         Show:Clear all [x] Manual[x] Template[x] Copied  Added by: [x] Bryce Gong, RN  [] Hover for details PMR Admission Coordinator Pre-Admission Assessment  Patient: [name]. is an 77 y.o., male MRN: [id number] DOB: April 30, 1941 Height: 5\' 6"  (167.6 cm) Weight: 58.9 kg                                                                                                                                                  Insurance Information HMO:     PPO:      PCP:      IPA:      80/20:      OTHER: no HMO PRIMARY: Medicare a and b      Policy#: [id number]      Subscriber: pt Benefits:  Phone #: online     Name:  04/10/2018 Eff. Date: 04/14/2005     Deduct: $1364      Out of Pocket Max: none      Life Max: none CIR: 100%      SNF: 20 full days Outpatient: 80%     Co-Pay: 20% Home Health: 100%      Co-Pay: none DME: 80%     Co-Pay: 20% Providers: pt choice  SECONDARY: [employer] [place] supplement      Policy#: [id number]      Subscriber: pt  Medicaid Application Date:       Case Manager:  Disability Application Date:       Case Worker:   Emergency Contact Information         Contact Information    Name Relation Home Work Mobile   [place] Daughter [phone]  [phone]     Current Medical History  Patient Admitting Diagnosis: Debility; Parkinson's  History of Present Illness: [name] is a 77 year old right-handed male with history of diabetes mellitus, headaches, aortic stenosis status post TAVR procedure , Parkinson's disease maintained on Sinemet/Aricept with baseline mild cognitive deficits. Presented 04/08/2018 with significant difficulty in gait with  bradykinesia. Patient with history of lid opening apraxia with plans for Botox in the future. MRI of the brain reviewed, showing atrophy. Per report, cerebral atrophy with small hygromas without mass effect. Echocardiogram with ejection fraction of 53% grade 1 diastolic dysfunction. Carotid Dopplers with no ICA stenosis. He did have a KUB revealing large volume stool filling the entire colon. Neurology consulted with recommendations of adjusting his Sinemet. Subcutaneous Lovenox for DVT prophylaxis. Tolerating a regular consistency diet  .Complete NIHSS TOTAL: 1  Past Medical History  Past Medical History:  Diagnosis Date  . Anxiety   . Aortic stenosis   . Arthritis    left hip  . Cancer ([place])    basal cell- facial?  . Depression   . Diabetes mellitus without complication ([place])   . GERD (gastroesophageal reflux disease)   . Headache(784.0)   . Heart murmur   . Hernia, inguinal, bilateral, recurrent   . Hypertension   . Kidney stones   . Parkinson disease ([place])   . Parkinson's disease (tremor, stiffness, slow motion, unstable posture) ([place]) 12/11/2006  . PONV (postoperative nausea and vomiting)   . Restless leg syndrome   . S/P TAVR (transcatheter aortic valve replacement) 11/18/2013   26 mm Edwards Sapien XT transcatheter heart valve placed via open right transfemoral approach  . TIA (transient ischemic attack) 02/08/2007    Family History  family history includes Anemia in his daughter; Arthritis/Rheumatoid in his mother; Asthma in his daughter; Breast cancer in his sister; Cancer in his son; Diabetes in his daughter; Hypertension in his daughter; Kidney disease in his daughter; Prostate cancer in his father.  Prior Rehab/Hospitalizations:  Has the patient had major surgery during 100 days prior to admission? No  Current Medications   Current Facility-Administered Medications:  .  acetaminophen (TYLENOL) tablet 650 mg, 650 mg, Oral, Q6H PRN, 650 mg at  04/09/18 0556 **OR** acetaminophen (TYLENOL) suppository 650 mg, 650 mg, Rectal, Q6H PRN, Arrien, Jimmy Picket, MD .  aspirin EC tablet 81 mg, 81 mg, Oral, Daily, Arrien, Jimmy Picket, MD, 81 mg at 04/15/18 0933 .  bisacodyl (DULCOLAX) suppository 10 mg, 10 mg, Rectal, Daily PRN, Pokhrel, Laxman, MD .  carbidopa-levodopa (SINEMET IR) 25-100 MG per tablet immediate release 2 tablet, 2 tablet, Oral, QID, Greta Doom, MD, 2 tablet at 04/15/18 0933 .  cholecalciferol (VITAMIN D3) tablet 1,000 Units, 1,000 Units, Oral, QHS, Arrien, Jimmy Picket, MD, 1,000 Units at 04/14/18 2218 .  ciprofloxacin (CILOXAN) 0.3 % ophthalmic solution 1 drop, 1 drop, Both Eyes, Q4H while awake, Adhikari, Amrit, MD, 1 drop at 04/15/18 0934 .  clonazePAM (KLONOPIN) tablet 0.5 mg, 0.5 mg, Oral, QHS, Arrien, Jimmy Picket, MD, 0.5 mg at 04/14/18 2218 .  docusate sodium (COLACE) capsule 100 mg, 100 mg, Oral, BID, Pokhrel, Laxman, MD, 100 mg at 04/15/18 0957 .  donepezil (ARICEPT) tablet 10 mg, 10 mg, Oral, QHS, Arrien, Jimmy Picket, MD, 10 mg at 04/14/18 2218 .  enoxaparin (LOVENOX) injection 40 mg, 40 mg, Subcutaneous, Q24H, Arrien, Jimmy Picket, MD, 40 mg at 04/14/18 2053 .  hydrALAZINE (APRESOLINE) injection 10 mg, 10 mg, Intravenous, Q6H PRN, Bodenheimer, Charles A, NP .  mirabegron ER (MYRBETRIQ) tablet 50 mg, 50 mg, Oral, Daily, Arrien, Jimmy Picket, MD, 50 mg at 04/15/18 0933 .  ondansetron (ZOFRAN) tablet 4 mg, 4 mg, Oral, Q6H PRN **OR** ondansetron (ZOFRAN) injection 4 mg, 4 mg, Intravenous, Q6H PRN, Arrien, Jimmy Picket, MD .  polyethylene glycol (MIRALAX / GLYCOLAX) packet 17 g, 17 g, Oral, Daily, Pokhrel, Laxman, MD, 17 g at 04/15/18 0933 .  sertraline (ZOLOFT) tablet 12.5 mg, 12.5 mg, Oral, Daily, Arrien, Jimmy Picket, MD, 12.5 mg at 04/15/18 9024  Patients Current Diet:     Diet Order                  Diet - low sodium heart healthy         Diet heart healthy/carb  modified Room service appropriate? Yes; Fluid consistency: Thin  Diet effective now  Precautions / Restrictions Precautions Precautions: Fall Restrictions Weight Bearing Restrictions: No   Has the patient had 2 or more falls or a fall with injury in the past year?No  Prior Activity Level Limited Community (1-2x/wk): Mod I with cane pta; does not drive  Development worker, international aid / [place] Devices/Equipment: Radio producer (specify quad or straight) Home Equipment: Cane - single point, Walker - 2 wheels, Bedside commode, Grab bars - tub/shower, Transport chair  Prior Device Use: Indicate devices/aids used by the patient prior to current illness, exacerbation or injury? cane  Prior Functional Level Prior Function Level of Independence: Independent with assistive device(s) Comments: uses a cane in the house most of the time; in the community use the can as well  Self Care: Did the patient need help bathing, dressing, using the toilet or eating?  Independent  Indoor Mobility: Did the patient need assistance with walking from room to room (with or without device)? Independent  Stairs: Did the patient need assistance with internal or external stairs (with or without device)? Needed some help  Functional Cognition: Did the patient need help planning regular tasks such as shopping or remembering to take medications? Needed some help  Current Functional Level Cognition  Arousal/Alertness: Awake/alert Overall Cognitive Status: Within Functional Limits for tasks assessed Current Attention Level: Selective Orientation Level: Oriented X4 Following Commands: Follows one step commands inconsistently, Follows one step commands with increased time Safety/Judgement: Decreased awareness of safety, Decreased awareness of deficits General Comments: pt joking with staff during session; pt is soft spoken and with stutter at times so pt is difficult to understand  at times   Attention: Sustained Sustained Attention: Impaired Sustained Attention Impairment: Verbal basic, Functional basic(kept eyes closed during assessment) Memory: Impaired Memory Impairment: Retrieval deficit, Decreased recall of new information, Decreased short term memory Decreased Short Term Memory: Verbal basic, Functional basic Awareness: Appears intact Problem Solving: Impaired Problem Solving Impairment: Verbal basic, Functional basic Executive Function: Organizing, Decision Making Organizing: Impaired Organizing Impairment: Verbal basic, Functional basic Decision Making: Impaired Decision Making Impairment: Verbal basic, Functional basic Behaviors: Perseveration Safety/Judgment: Other (comment)(DTA)    Extremity Assessment (includes Sensation/Coordination)  Upper Extremity Assessment: Generalized weakness, RUE deficits/detail, LUE deficits/detail RUE Deficits / Details: No focal weakness identified; pt attempting to use funcitonally; slow rigid type movemetns; difficulty with holding utensil for self feeding RUE Coordination: decreased fine motor, decreased gross motor LUE Deficits / Details: similar to R  Lower Extremity Assessment: Defer to PT evaluation    ADLs  Overall ADL's : Needs assistance/impaired Eating/Feeding: Maximal assistance Eating/Feeding Details (indicate cue type and reason): daughter feeding pt; appesr having difficulty keeping eyes open; bringing ofood to mouth that is not there Grooming: Maximal assistance Upper Body Bathing: Maximal assistance, Sitting Lower Body Bathing: Maximal assistance, Sit to/from stand Upper Body Dressing : Maximal assistance, Sitting Lower Body Dressing: Maximal assistance, Sit to/from stand Lower Body Dressing Details (indicate cue type and reason): pt able to lean forward to touch socks and begin pulling socks up; appears to perseverate on task Toilet Transfer: Maximal assistance Toilet Transfer Details (indicate  cue type and reason): simulated Toileting- Clothing Manipulation and Hygiene: Maximal assistance Functional mobility during ADLs: Maximal assistance, +2 for safety/equipment    Mobility  Overal bed mobility: Needs Assistance Bed Mobility: Supine to Sit Supine to sit: Min guard Sit to supine: Mod assist General bed mobility comments: HOB elevated and use of rail; min guard for safety and increased time and effort; cues to follow through with task  Transfers  Overall transfer level: Needs assistance Equipment used: None Transfers: Sit to/from Stand Sit to Stand: Mod assist Stand pivot transfers: Mod assist General transfer comment: assist for balance as pt has posterior bias upon standing; pt requires at least single UE support from AD and assist at trunk from therapist to maintain balance     Ambulation / Gait / Stairs / Wheelchair Mobility  Ambulation/Gait Ambulation/Gait assistance: Mod assist, +2 safety/equipment(chair follow) Gait Distance (Feet): (60 ft then 75 ft with seated break) Assistive device: Rolling walker (2 wheeled), Straight cane Gait Pattern/deviations: Decreased stride length, Trunk flexed, Narrow base of support, Shuffle, Decreased step length - left, Decreased step length - right, Decreased dorsiflexion - right, Decreased dorsiflexion - left, Festinating General Gait Details: initially RW used for gait training and then [facility]; pt requries external assistance from therapist for balance with either AD; cues for increased step lenths and wider BOS which pt is able to correct for brief time; mod A to maintain balance  Gait velocity: reduced Gait velocity interpretation: <1.8 ft/sec, indicate of risk for recurrent falls    Posture / Balance Dynamic Sitting Balance Sitting balance - Comments: pt able to reach across to touch an object midline with min guard for safety.  Balance Overall balance assessment: Needs assistance Sitting-balance support: Feet  supported Sitting balance-Leahy Scale: Fair Sitting balance - Comments: pt able to reach across to touch an object midline with min guard for safety.  Standing balance support: Bilateral upper extremity supported, During functional activity Standing balance-Leahy Scale: Poor Standing balance comment: retropulsion    Special needs/care consideration BiPAP/CPAP n/a CPM n/a Continuous Drip IV n/a Dialysis n/a Life Vest n/a Oxygen n/a Special Bed n/a Trach Size n/a Wound Vac n/a Skin intact Bowel mgmt: continent LBM 04/14/2018 Bladder mgmt: incontinent Diabetic mgmt n/a   Previous Home Environment Living Arrangements: Alone  Lives With: Alone Available Help at Discharge: (sister lives next door; son and daughter check in with him ) Type of Home: House Home Layout: Two level(one level with a basement that he does not use) Home Access: Ramped entrance(ramp used for his wife before she died 5 years ago) Entrance Stairs-Rails: Horticulturist, commercial of Steps: ramp entry Bathroom Shower/Tub: Multimedia programmer: Programmer, systems: No Home Care Services: No  Discharge Living Setting Plans for Discharge Living Setting: Patient's home, Alone Type of Home at Discharge: House Discharge Home Layout: Two level(one level with ramp and basement) Discharge Home Access: Ramped entrance Discharge Bathroom Shower/Tub: Walk-in shower Discharge Bathroom Toilet: Standard Discharge Bathroom Accessibility: Yes How Accessible: Accessible via walker Does the patient have any problems obtaining your medications?: No  Social/Family/Support Systems Patient Roles: Parent Contact Information: daughter, Neoma Laming Anticipated Caregiver: daughter and son, sister and may hire assist if recommended Anticipated Caregiver's Contact Information: see above Ability/Limitations of Caregiver: son and daughter work Building control surveyor Availability: ([facility] 24/7 supervision can be arranged  if needed; daughter) Discharge Plan Discussed with Primary Caregiver: Yes Is Caregiver In Agreement with Plan?: Yes Does Caregiver/Family have Issues with Lodging/Transportation while Pt is in Rehab?: No  Daughter and son very involved and have made adaptations at pt's home pta. They provide food that pt just warms up. They have a pill box system for meds, and son face times pt to observe him taking his pills. Daughter states patient typically bounces back pretty quickly. She will arrange caregiver support as needed but feels he unlikely to need 24/7 supervision long term.  Goals/Additional Needs Patient/Family Goal for Rehab:  min assist with PT and OT, min to mod assist with SLP Expected length of stay: ELOS 10 to 14 days Equipment Needs: daughter does pillbox system; son face times patient using Ipad to remind ot administer meds Pt/Family Agrees to Admission and willing to participate: Yes Program Orientation Provided & Reviewed with Pt/Caregiver Including Roles  & Responsibilities: Yes   Decrease burden of Care through IP rehab admission: n/a  Possible need for SNF placement upon discharge: not anticipated  Patient Condition: This patient's medical and functional status has changed since the consult dated 04/10/2018 in which the Rehabilitation Physician determined and documented that the patient was potentially appropriate for intensive rehabilitative care in an inpatient rehabilitation facility. Issues have been addressed and update has been discussed with Dr. Posey Pronto and patient now appropriate for inpatient rehabilitation. Will admit to inpatient rehab today.   Preadmission Screen Completed By:  Cleatrice Burke, 04/15/2018 12:39 PM ______________________________________________________________________   Discussed status with Dr. Posey Pronto on 04/15/2018 at  1248 and received telephone approval for admission today.  Admission Coordinator:  Cleatrice Burke, time 1308 Date  04/15/2018           Cosigned by: Jamse Arn, MD at 04/15/2018 12:55 PM  Revision History

## 2018-04-15 NOTE — H&P (Signed)
Physical Medicine and Rehabilitation Admission H&P    Chief Complaint  Patient presents with  . Fatigue  : HPI: [name] is a 77 year old right-handed male with history of  headaches, aortic stenosis status post TAVR procedure , Parkinson's disease followed by neurology services [name] maintained on Sinemet/Aricept with baseline mild cognitive deficits. 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 2 level home with bedroom on main. Ramped entry of home. He has a son and daughter in the area as well as a sister next door. Family arranging 24-hour assistance. Family did assist with preparing meals. Presented 04/08/2018 with significant difficulty in gait with bradykinesia. Patient with history of lid opening apraxia with plans for Botox in the future. MRI of the brain reviewed, showing atrophy. Per report, cerebral atrophy with small hygromas without mass effect. Echocardiogram with ejection fraction of 41% grade 1 diastolic dysfunction. Carotid Dopplers with no ICA stenosis. He did have a KUB revealing large volume stool filling the entire colon. Neurology consulted with recommendations of adjusting his Sinemet. Subcutaneous Lovenox for DVT prophylaxis. Tolerating a regular consistency diet.Therapy evaluations completed with recommendations of physical medicine rehabilitation consult. Patient was admitted for a compress of rehabilitation program.  Review of Systems  Unable to perform ROS: Dementia   Past Medical History:  Diagnosis Date  . Anxiety   . Aortic stenosis   . Arthritis    left hip  . Cancer ([place])    basal cell- facial?  . Depression   . Diabetes mellitus without complication ([place])   . GERD (gastroesophageal reflux disease)   . Headache(784.0)   . Heart murmur   . Hernia, inguinal, bilateral, recurrent   . Hypertension   . Kidney stones   . Parkinson disease ([place])   . Parkinson's disease  (tremor, stiffness, slow motion, unstable posture) ([place]) 12/11/2006  . PONV (postoperative nausea and vomiting)   . Restless leg syndrome   . S/P TAVR (transcatheter aortic valve replacement) 11/18/2013   26 mm Edwards Sapien XT transcatheter heart valve placed via open right transfemoral approach  . TIA (transient ischemic attack) 02/08/2007   Past Surgical History:  Procedure Laterality Date  . CARDIAC CATHETERIZATION  10/09/13  . cataract  2008   bilateral  . COLONOSCOPY    . CYSTOSCOPY/RETROGRADE/URETEROSCOPY  05/29/2011   Procedure: CYSTOSCOPY/RETROGRADE/URETEROSCOPY;  Surgeon: Malka So, MD;  Location: [facility];  Service: Urology;  Laterality: Left;  no stent   . EYE SURGERY Bilateral    /w IOL  . INGUINAL HERNIA REPAIR Bilateral 03/30/2014   Procedure: OPEN BILATERAL INGUINAL HERNIA REPAIR WITH MESH;  Surgeon: Coralie Keens, MD;  Location: [facility];  Service: General;  Laterality: Bilateral;  . INSERTION OF MESH Bilateral 03/30/2014   Procedure: INSERTION OF MESH;  Surgeon: Coralie Keens, MD;  Location: [facility];  Service: General;  Laterality: Bilateral;  . INTRAOPERATIVE TRANSESOPHAGEAL ECHOCARDIOGRAM N/A 11/18/2013   Procedure: INTRAOPERATIVE TRANSESOPHAGEAL ECHOCARDIOGRAM;  Surgeon: Sherren Mocha, MD;  [facility]: [facility] OR;  Service: Open Heart Surgery;  Laterality: N/A;  . LEFT AND RIGHT HEART CATHETERIZATION WITH CORONARY ANGIOGRAM N/A 10/09/2013   Procedure: LEFT AND RIGHT HEART CATHETERIZATION WITH CORONARY ANGIOGRAM;  Surgeon: Blane Ohara, MD;  Location: [facility] CATH LAB;  Service: Cardiovascular;  Laterality: N/A;  . TRANSCATHETER AORTIC VALVE REPLACEMENT, TRANSFEMORAL N/A 11/18/2013   Procedure: TRANSCATHETER AORTIC VALVE REPLACEMENT, TRANSFEMORAL;  Surgeon: Sherren Mocha, MD;  Location: [facility];  Service: Open Heart Surgery;  Laterality: N/A;   Family History  Problem Relation Age of Onset  . Arthritis/Rheumatoid Mother   . Prostate cancer Father   . Anemia Daughter   . Asthma  Daughter   . Diabetes Daughter   . Hypertension Daughter   . Kidney disease Daughter   . Cancer Son   . Breast cancer Sister   . Heart attack Neg Hx   . Stroke Neg Hx    Social History:  reports that he has never smoked. He has never used smokeless tobacco. He reports that he does not drink alcohol or use drugs. Allergies:  Allergies  Allergen Reactions  . Penicillins     Unsure of reaction  . Vicodin [Hydrocodone-Acetaminophen] Other (See Comments)    "Mental reaction"   Medications Prior to Admission  Medication Sig Dispense Refill  . acetaminophen (TYLENOL) 500 MG tablet Take 1,000 mg by mouth 2 (two) times daily as needed (pain). For pain    . aspirin EC 81 MG tablet Take 1 tablet (81 mg total) by mouth daily.    . carbidopa-levodopa (SINEMET IR) 25-100 MG tablet Take 2 tablets by mouth 3 (three) times daily. 540 tablet 1  . cholecalciferol (VITAMIN D) 1000 UNITS tablet Take 1,000 Units by mouth at bedtime.     . clonazePAM (KLONOPIN) 0.5 MG tablet TAKE 1 TABLET (0.5 MG TOTAL) BY MOUTH AT BEDTIME. 90 tablet 1  . donepezil (ARICEPT) 10 MG tablet Take 1 tablet (10 mg total) by mouth at bedtime. 90 tablet 1  . MYRBETRIQ 50 MG TB24 tablet Take 50 mg by mouth daily.  11  . sertraline (ZOLOFT) 25 MG tablet Take 12.5 mg by mouth daily.       Drug Regimen Review Drug regimen was reviewed and remains appropriate with no significant issues identified  Home: Home Living Family/patient expects to be discharged to:: Private residence Living Arrangements: (P) Alone Available Help at Discharge: (P) (sister lives next door; son and daughter check in with him ) Type of Home: House Home Access: (P) Ramped entrance(ramp used for his wife before she died 5 years ago) Technical brewer of Steps: ramp entry Entrance Stairs-Rails: Left Home Layout: (P) Two level(one level with a basement that he does not use) Bathroom Shower/Tub: Multimedia programmer: Standard Bathroom  Accessibility: No Home Equipment: Cane - single point, Environmental consultant - 2 wheels, Bedside commode, Grab bars - tub/shower, Transport chair  Lives With: (P) Alone   Functional History: Prior Function Level of Independence: Independent with assistive device(s) Comments: uses a cane in the house most of the time; in the community use the can as well  Functional Status:  Mobility: Bed Mobility Overal bed mobility: Needs Assistance Bed Mobility: Supine to Sit Supine to sit: Min guard Sit to supine: Mod assist General bed mobility comments: HOB elevated and use of rail; min guard for safety and increased time and effort; cues to follow through with task Transfers Overall transfer level: Needs assistance Equipment used: None Transfers: Sit to/from Stand Sit to Stand: Mod assist Stand pivot transfers: Mod assist General transfer comment: assist for balance as pt has posterior bias upon standing; pt requires at least single UE support from AD and assist at trunk from therapist to maintain balance  Ambulation/Gait Ambulation/Gait assistance: Mod assist, +2 safety/equipment(chair follow) Gait Distance (Feet): (60 ft then 75 ft with seated break) Assistive device: Rolling walker (2 wheeled), Straight cane Gait Pattern/deviations: Decreased stride length, Trunk flexed, Narrow base of support,  Shuffle, Decreased step length - left, Decreased step length - right, Decreased dorsiflexion - right, Decreased dorsiflexion - left, Festinating General Gait Details: initially RW used for gait training and then [facility]; pt requries external assistance from therapist for balance with either AD; cues for increased step lenths and wider BOS which pt is able to correct for brief time; mod A to maintain balance  Gait velocity: reduced Gait velocity interpretation: <1.8 ft/sec, indicate of risk for recurrent falls    ADL: ADL Overall ADL's : Needs assistance/impaired Eating/Feeding: Maximal assistance Eating/Feeding  Details (indicate cue type and reason): daughter feeding pt; appesr having difficulty keeping eyes open; bringing ofood to mouth that is not there Grooming: Maximal assistance Upper Body Bathing: Maximal assistance, Sitting Lower Body Bathing: Maximal assistance, Sit to/from stand Upper Body Dressing : Maximal assistance, Sitting Lower Body Dressing: Maximal assistance, Sit to/from stand Lower Body Dressing Details (indicate cue type and reason): pt able to lean forward to touch socks and begin pulling socks up; appears to perseverate on task Toilet Transfer: Maximal assistance Toilet Transfer Details (indicate cue type and reason): simulated Toileting- Clothing Manipulation and Hygiene: Maximal assistance Functional mobility during ADLs: Maximal assistance, +2 for safety/equipment  Cognition: Cognition Overall Cognitive Status: Within Functional Limits for tasks assessed Arousal/Alertness: (P) Awake/alert Orientation Level: Oriented X4 Attention: Sustained Sustained Attention: Impaired Sustained Attention Impairment: Verbal basic, Functional basic(kept eyes closed during assessment) Memory: Impaired Memory Impairment: Retrieval deficit, Decreased recall of new information, Decreased short term memory Decreased Short Term Memory: Verbal basic, Functional basic Awareness: Appears intact Problem Solving: Impaired Problem Solving Impairment: Verbal basic, Functional basic Executive Function: Organizing, Decision Making Organizing: Impaired Organizing Impairment: Verbal basic, Functional basic Decision Making: Impaired Decision Making Impairment: Verbal basic, Functional basic Behaviors: Perseveration Safety/Judgment: Other (comment)(DTA) Cognition Arousal/Alertness: Awake/alert Behavior During Therapy: WFL for tasks assessed/performed Overall Cognitive Status: Within Functional Limits for tasks assessed Area of Impairment: Attention, Following commands, Safety/judgement Current  Attention Level: Selective Following Commands: Follows one step commands inconsistently, Follows one step commands with increased time Safety/Judgement: Decreased awareness of safety, Decreased awareness of deficits General Comments: pt joking with staff during session; pt is soft spoken and with stutter at times so pt is difficult to understand at times    Physical Exam: Blood pressure (!) 146/77, pulse 67, temperature 98.6 F (37 C), temperature source Oral, resp. rate 15, height 5\' 6"  (1.676 m), weight 58.9 kg, SpO2 99 %. Physical Exam  Constitutional: He appears well-developed.  77 year old right handed frail male sitting up in bed.  HENT:  Head: Normocephalic and atraumatic.  Eyes: EOM are normal. Right eye exhibits no discharge. Left eye exhibits no discharge.  Pupils sluggish but reactive to light.  Neck: Normal range of motion. Neck supple. No thyromegaly present.  Cardiovascular: Normal rate and regular rhythm.  Respiratory: Effort normal and breath sounds normal. No respiratory distress.  Limited inspiratory effort.  GI: Soft. Bowel sounds are normal. He exhibits no distension.  Musculoskeletal:  No edema or tenderness in extremities  Neurological:  Patient is alert sitting up in bed  Makes good eye contact with examiner.  Provides his name and age. Limited overall medical historian.  Follow simple commands. Motor: Grossly 4+/5 throughout, limited by bradykinesia and rigidity  Skin: Skin is warm and dry.  Psychiatric: His affect is blunt. His speech is delayed and slurred. He is slowed. Cognition and memory are impaired.    Results for orders placed or performed during the hospital encounter of 04/08/18 (from the  past 48 hour(s))  Creatinine, serum     Status: None   Collection Time: 04/15/18  4:37 AM  Result Value Ref Range   Creatinine, Ser 1.08 0.61 - 1.24 mg/dL   GFR calc non Af Amer >60 >60 mL/min   GFR calc Af Amer >60 >60 mL/min    Comment: Performed at [facility] [address] [place], [place] [address]   No results found.     Medical Problem List and Plan: 1.  Debility secondary to  Parkinson's disease/multi-medical 2.  DVT Prophylaxis/Anticoagulation: Subcutaneous Lovenox 3. Pain Management:  Tylenol as needed 4. Mood:  Klonopin 0.5 mg daily at bedtime, Aricept 10 mg daily at bedtime,Zoloft 12.5 mg daily 5. Neuropsych: This patient is capable of making decisions on his own behalf. 6. Skin/Wound Care:  Routine skin checks 7. Fluids/Electrolytes/Nutrition:  Routine in and out's with follow-up chemistries 8.Parkinson's disease. Sinemet 2 tablets 4 times a day. Follow-up outpatient neurology Dr. Skipper Cliche 9.  History of aortic stenosis. Status post TAVR procedure. Continue aspirin 10. Constipation. Laxative assistance 11.Overactive bladder.myrbetriq  Post Admission Physician Evaluation: 1. Preadmission assessment reviewed and changes made below. 2. Functional deficits secondary  to Parkinson's disease. 3. Patient is admitted to receive collaborative, interdisciplinary care between the physiatrist, rehab nursing staff, and therapy team. 4. Patient's level of medical complexity and substantial therapy needs in context of that medical necessity cannot be provided at a lesser intensity of care such as a SNF. 5. Patient has experienced substantial functional loss from his/her baseline which was documented above under the "Functional History" and "Functional Status" headings.  Judging by the patient's diagnosis, physical exam, and functional history, the patient has potential for functional progress which will result in measurable gains while on inpatient rehab.  These gains will be of substantial and practical use upon discharge  in facilitating mobility and self-care at the household level. 54. Physiatrist will provide 24 hour management of medical needs as well as oversight of the therapy plan/treatment and provide guidance as appropriate  regarding the interaction of the two. 7. 24 hour rehab nursing will assist with bladder management, safety, disease management, medication administration and patient education  and help integrate therapy concepts, techniques,education, etc. 8. PT will assess and treat for/with: Lower extremity strength, range of motion, stamina, balance, functional mobility, safety, adaptive techniques and equipment, coping skills, pain control, education. Goals are: Min a. 9. OT will assess and treat for/with: ADL's, functional mobility, safety, upper extremity strength, adaptive techniques and equipment, ego support, and community reintegration.   Goals are: Min a. Therapy may proceed with showering this patient. 10. SLP will assess and treat for/with: Speech, language, cognition.  Goals are: Min/mod a. 11. Case Management and Social Worker will assess and treat for psychological issues and discharge planning. 12. Team conference will be held weekly to assess progress toward goals and to determine barriers to discharge. 13. Patient will receive at least 3 hours of therapy per day at least 5 days per week. 14. ELOS: 14-18 days.       15. Prognosis:  fair  I have personally performed a face to face diagnostic evaluation, including, but not limited to relevant history and physical exam findings, of this patient and developed relevant assessment and plan.  Additionally, I have reviewed and concur with the physician assistant's documentation above.  The patient's status has not changed. The original post admission physician evaluation remains appropriate, and any changes from the pre-admission screening or documentation from the  acute chart are noted above.    Delice Lesch, MD, ABPMR Lavon Paganini Angiulli, PA-C 04/15/2018

## 2018-04-15 NOTE — PMR Pre-admission (Signed)
PMR Admission Coordinator Pre-Admission Assessment  Patient: [name]. is an 77 y.o., male MRN: [id number] DOB: 1940-09-27 Height: 5\' 6"  (167.6 cm) Weight: 58.9 kg              Insurance Information HMO:     PPO:      PCP:      IPA:      80/20:      OTHER: no HMO PRIMARY: Medicare a and b      Policy#: [id number]      Subscriber: pt Benefits:  Phone #: online     Name:  04/10/2018 Eff. Date: 04/14/2005     Deduct: $1364      Out of Pocket Max: none      Life Max: none CIR: 100%      SNF: 20 full days Outpatient: 80%     Co-Pay: 20% Home Health: 100%      Co-Pay: none DME: 80%     Co-Pay: 20% Providers: pt choice  SECONDARY: [employer] [place] supplement      Policy#: [id number]      Subscriber: pt  Medicaid Application Date:       Case Manager:  Disability Application Date:       Case Worker:   Emergency Contact Information Contact Information    Name Relation Home Work Mobile   [place] Daughter [phone]  [phone]     Current Medical History  Patient Admitting Diagnosis: Debility; Parkinson's  History of Present Illness: [name] is a 77 year old right-handed male with history of diabetes mellitus, headaches, aortic stenosis status post TAVR procedure , Parkinson's disease maintained on Sinemet/Aricept with baseline mild cognitive deficits. Presented 04/08/2018 with significant difficulty in gait with bradykinesia. Patient with history of lid opening apraxia with plans for Botox in the future. MRI of the brain reviewed, showing atrophy. Per report, cerebral atrophy with small hygromas without mass effect. Echocardiogram with ejection fraction of 38% grade 1 diastolic dysfunction. Carotid Dopplers with no ICA stenosis. He did have a KUB revealing large volume stool filling the entire colon. Neurology consulted with recommendations of adjusting his Sinemet. Subcutaneous Lovenox for DVT prophylaxis. Tolerating a regular consistency diet  .Complete NIHSS  TOTAL: 1    Past Medical History  Past Medical History:  Diagnosis Date  . Anxiety   . Aortic stenosis   . Arthritis    left hip  . Cancer ([place])    basal cell- facial?  . Depression   . Diabetes mellitus without complication ([place])   . GERD (gastroesophageal reflux disease)   . Headache(784.0)   . Heart murmur   . Hernia, inguinal, bilateral, recurrent   . Hypertension   . Kidney stones   . Parkinson disease ([place])   . Parkinson's disease (tremor, stiffness, slow motion, unstable posture) ([place]) 12/11/2006  . PONV (postoperative nausea and vomiting)   . Restless leg syndrome   . S/P TAVR (transcatheter aortic valve replacement) 11/18/2013   26 mm Edwards Sapien XT transcatheter heart valve placed via open right transfemoral approach  . TIA (transient ischemic attack) 02/08/2007    Family History  family history includes Anemia in his daughter; Arthritis/Rheumatoid in his mother; Asthma in his daughter; Breast cancer in his sister; Cancer in his son; Diabetes in his daughter; Hypertension in his daughter; Kidney disease in his daughter; Prostate cancer in his father.  Prior Rehab/Hospitalizations:  Has the patient had major surgery during 100 days prior to admission? No  Current Medications   Current  Facility-Administered Medications:  .  acetaminophen (TYLENOL) tablet 650 mg, 650 mg, Oral, Q6H PRN, 650 mg at 04/09/18 0556 **OR** acetaminophen (TYLENOL) suppository 650 mg, 650 mg, Rectal, Q6H PRN, Arrien, Jimmy Picket, MD .  aspirin EC tablet 81 mg, 81 mg, Oral, Daily, Arrien, Jimmy Picket, MD, 81 mg at 04/15/18 0933 .  bisacodyl (DULCOLAX) suppository 10 mg, 10 mg, Rectal, Daily PRN, Pokhrel, Laxman, MD .  carbidopa-levodopa (SINEMET IR) 25-100 MG per tablet immediate release 2 tablet, 2 tablet, Oral, QID, Greta Doom, MD, 2 tablet at 04/15/18 0933 .  cholecalciferol (VITAMIN D3) tablet 1,000 Units, 1,000 Units, Oral, QHS, Arrien, Jimmy Picket, MD, 1,000 Units  at 04/14/18 2218 .  ciprofloxacin (CILOXAN) 0.3 % ophthalmic solution 1 drop, 1 drop, Both Eyes, Q4H while awake, Adhikari, Amrit, MD, 1 drop at 04/15/18 0934 .  clonazePAM (KLONOPIN) tablet 0.5 mg, 0.5 mg, Oral, QHS, Arrien, Jimmy Picket, MD, 0.5 mg at 04/14/18 2218 .  docusate sodium (COLACE) capsule 100 mg, 100 mg, Oral, BID, Pokhrel, Laxman, MD, 100 mg at 04/15/18 0957 .  donepezil (ARICEPT) tablet 10 mg, 10 mg, Oral, QHS, Arrien, Jimmy Picket, MD, 10 mg at 04/14/18 2218 .  enoxaparin (LOVENOX) injection 40 mg, 40 mg, Subcutaneous, Q24H, Arrien, Jimmy Picket, MD, 40 mg at 04/14/18 2053 .  hydrALAZINE (APRESOLINE) injection 10 mg, 10 mg, Intravenous, Q6H PRN, Bodenheimer, Charles A, NP .  mirabegron ER (MYRBETRIQ) tablet 50 mg, 50 mg, Oral, Daily, Arrien, Jimmy Picket, MD, 50 mg at 04/15/18 0933 .  ondansetron (ZOFRAN) tablet 4 mg, 4 mg, Oral, Q6H PRN **OR** ondansetron (ZOFRAN) injection 4 mg, 4 mg, Intravenous, Q6H PRN, Arrien, Jimmy Picket, MD .  polyethylene glycol (MIRALAX / GLYCOLAX) packet 17 g, 17 g, Oral, Daily, Pokhrel, Laxman, MD, 17 g at 04/15/18 0933 .  sertraline (ZOLOFT) tablet 12.5 mg, 12.5 mg, Oral, Daily, Arrien, Jimmy Picket, MD, 12.5 mg at 04/15/18 6720  Patients Current Diet:  Diet Order            Diet - low sodium heart healthy        Diet heart healthy/carb modified Room service appropriate? Yes; Fluid consistency: Thin  Diet effective now              Precautions / Restrictions Precautions Precautions: Fall Restrictions Weight Bearing Restrictions: No   Has the patient had 2 or more falls or a fall with injury in the past year?No  Prior Activity Level Limited Community (1-2x/wk): Mod I with cane pta; does not drive  Development worker, international aid / [place] Devices/Equipment: Radio producer (specify quad or straight) Home Equipment: Cane - single point, Walker - 2 wheels, Bedside commode, Grab bars - tub/shower, Transport chair  Prior  Device Use: Indicate devices/aids used by the patient prior to current illness, exacerbation or injury? cane  Prior Functional Level Prior Function Level of Independence: Independent with assistive device(s) Comments: uses a cane in the house most of the time; in the community use the can as well  Self Care: Did the patient need help bathing, dressing, using the toilet or eating?  Independent  Indoor Mobility: Did the patient need assistance with walking from room to room (with or without device)? Independent  Stairs: Did the patient need assistance with internal or external stairs (with or without device)? Needed some help  Functional Cognition: Did the patient need help planning regular tasks such as shopping or remembering to take medications? Needed some help  Current Functional Level Cognition  Arousal/Alertness:  Awake/alert Overall Cognitive Status: Within Functional Limits for tasks assessed Current Attention Level: Selective Orientation Level: Oriented X4 Following Commands: Follows one step commands inconsistently, Follows one step commands with increased time Safety/Judgement: Decreased awareness of safety, Decreased awareness of deficits General Comments: pt joking with staff during session; pt is soft spoken and with stutter at times so pt is difficult to understand at times   Attention: Sustained Sustained Attention: Impaired Sustained Attention Impairment: Verbal basic, Functional basic(kept eyes closed during assessment) Memory: Impaired Memory Impairment: Retrieval deficit, Decreased recall of new information, Decreased short term memory Decreased Short Term Memory: Verbal basic, Functional basic Awareness: Appears intact Problem Solving: Impaired Problem Solving Impairment: Verbal basic, Functional basic Executive Function: Organizing, Decision Making Organizing: Impaired Organizing Impairment: Verbal basic, Functional basic Decision Making: Impaired Decision  Making Impairment: Verbal basic, Functional basic Behaviors: Perseveration Safety/Judgment: Other (comment)(DTA)    Extremity Assessment (includes Sensation/Coordination)  Upper Extremity Assessment: Generalized weakness, RUE deficits/detail, LUE deficits/detail RUE Deficits / Details: No focal weakness identified; pt attempting to use funcitonally; slow rigid type movemetns; difficulty with holding utensil for self feeding RUE Coordination: decreased fine motor, decreased gross motor LUE Deficits / Details: similar to R  Lower Extremity Assessment: Defer to PT evaluation    ADLs  Overall ADL's : Needs assistance/impaired Eating/Feeding: Maximal assistance Eating/Feeding Details (indicate cue type and reason): daughter feeding pt; appesr having difficulty keeping eyes open; bringing ofood to mouth that is not there Grooming: Maximal assistance Upper Body Bathing: Maximal assistance, Sitting Lower Body Bathing: Maximal assistance, Sit to/from stand Upper Body Dressing : Maximal assistance, Sitting Lower Body Dressing: Maximal assistance, Sit to/from stand Lower Body Dressing Details (indicate cue type and reason): pt able to lean forward to touch socks and begin pulling socks up; appears to perseverate on task Toilet Transfer: Maximal assistance Toilet Transfer Details (indicate cue type and reason): simulated Toileting- Clothing Manipulation and Hygiene: Maximal assistance Functional mobility during ADLs: Maximal assistance, +2 for safety/equipment    Mobility  Overal bed mobility: Needs Assistance Bed Mobility: Supine to Sit Supine to sit: Min guard Sit to supine: Mod assist General bed mobility comments: HOB elevated and use of rail; min guard for safety and increased time and effort; cues to follow through with task    Transfers  Overall transfer level: Needs assistance Equipment used: None Transfers: Sit to/from Stand Sit to Stand: Mod assist Stand pivot transfers: Mod  assist General transfer comment: assist for balance as pt has posterior bias upon standing; pt requires at least single UE support from AD and assist at trunk from therapist to maintain balance     Ambulation / Gait / Stairs / Wheelchair Mobility  Ambulation/Gait Ambulation/Gait assistance: Mod assist, +2 safety/equipment(chair follow) Gait Distance (Feet): (60 ft then 75 ft with seated break) Assistive device: Rolling walker (2 wheeled), Straight cane Gait Pattern/deviations: Decreased stride length, Trunk flexed, Narrow base of support, Shuffle, Decreased step length - left, Decreased step length - right, Decreased dorsiflexion - right, Decreased dorsiflexion - left, Festinating General Gait Details: initially RW used for gait training and then [facility]; pt requries external assistance from therapist for balance with either AD; cues for increased step lenths and wider BOS which pt is able to correct for brief time; mod A to maintain balance  Gait velocity: reduced Gait velocity interpretation: <1.8 ft/sec, indicate of risk for recurrent falls    Posture / Balance Dynamic Sitting Balance Sitting balance - Comments: pt able to reach across to touch an object  midline with min guard for safety.  Balance Overall balance assessment: Needs assistance Sitting-balance support: Feet supported Sitting balance-Leahy Scale: Fair Sitting balance - Comments: pt able to reach across to touch an object midline with min guard for safety.  Standing balance support: Bilateral upper extremity supported, During functional activity Standing balance-Leahy Scale: Poor Standing balance comment: retropulsion    Special needs/care consideration BiPAP/CPAP n/a CPM n/a Continuous Drip IV n/a Dialysis n/a Life Vest n/a Oxygen n/a Special Bed n/a Trach Size n/a Wound Vac n/a Skin intact Bowel mgmt: continent LBM 04/14/2018 Bladder mgmt: incontinent Diabetic mgmt n/a   Previous Home Environment Living  Arrangements: Alone  Lives With: Alone Available Help at Discharge: (sister lives next door; son and daughter check in with him ) Type of Home: House Home Layout: Two level(one level with a basement that he does not use) Home Access: Ramped entrance(ramp used for his wife before she died 5 years ago) Entrance Stairs-Rails: Horticulturist, commercial of Steps: ramp entry Bathroom Shower/Tub: Multimedia programmer: Programmer, systems: No Home Care Services: No  Discharge Living Setting Plans for Discharge Living Setting: Patient's home, Alone Type of Home at Discharge: House Discharge Home Layout: Two level(one level with ramp and basement) Discharge Home Access: Ramped entrance Discharge Bathroom Shower/Tub: Walk-in shower Discharge Bathroom Toilet: Standard Discharge Bathroom Accessibility: Yes How Accessible: Accessible via walker Does the patient have any problems obtaining your medications?: No  Social/Family/Support Systems Patient Roles: Parent Contact Information: daughter, Neoma Laming Anticipated Caregiver: daughter and son, sister and may hire assist if recommended Anticipated Caregiver's Contact Information: see above Ability/Limitations of Caregiver: son and daughter work Building control surveyor Availability: ([place] 24/7 supervision can be arranged if needed; daughter) Discharge Plan Discussed with Primary Caregiver: Yes Is Caregiver In Agreement with Plan?: Yes Does Caregiver/Family have Issues with Lodging/Transportation while Pt is in Rehab?: No  Daughter and son very involved and have made adaptations at pt's home pta. They provide food that pt just warms up. They have a pill box system for meds, and son face times pt to observe him taking his pills. Daughter states patient typically bounces back pretty quickly. She will arrange caregiver support as needed but feels he unlikely to need 24/7 supervision long term.  Goals/Additional Needs Patient/Family Goal  for Rehab: min assist with PT and OT, min to mod assist with SLP Expected length of stay: ELOS 10 to 14 days Equipment Needs: daughter does pillbox system; son face times patient using Ipad to remind ot administer meds Pt/Family Agrees to Admission and willing to participate: Yes Program Orientation Provided & Reviewed with Pt/Caregiver Including Roles  & Responsibilities: Yes   Decrease burden of Care through IP rehab admission: n/a  Possible need for SNF placement upon discharge: not anticipated  Patient Condition: This patient's medical and functional status has changed since the consult dated 04/10/2018 in which the Rehabilitation Physician determined and documented that the patient was potentially appropriate for intensive rehabilitative care in an inpatient rehabilitation facility. Issues have been addressed and update has been discussed with Dr. Posey Pronto and patient now appropriate for inpatient rehabilitation. Will admit to inpatient rehab today.   Preadmission Screen Completed By:  Cleatrice Burke, 04/15/2018 12:39 PM ______________________________________________________________________   Discussed status with Dr. Posey Pronto on 04/15/2018 at  1248 and received telephone approval for admission today.  Admission Coordinator:  Cleatrice Burke, time [id number] Date 04/15/2018

## 2018-04-15 NOTE — Clinical Social Work Note (Signed)
Pt will admit to CIR today. Clinical Social Worker will sign off for now as social work intervention is no longer needed. Please consult [place] again if new need arises.   Shelton Silvas A Cimberly Stoffel 04/15/2018

## 2018-04-15 NOTE — Progress Notes (Signed)
Inpatient Rehabilitation Admissions Coordinator  I met with patient and his daughter at bedside. We discussed goals and expectations of an inpt rehab admit. They prefer inpt rehab and daughter will arrange support at home as needed.I have discussed with Dr. Delice Lesch.  I can admit pt to inpt rehab today. They prefer inpt rehab rather than SNF. I will contact Dr. Tawanna Solo for d/c orders. I have updated RN CM, RN and will make the arrangements to admit today.  Danne Baxter, RN, MSN Rehab Admissions Coordinator [phone] 04/15/2018 12:04 PM

## 2018-04-16 ENCOUNTER — Inpatient Hospital Stay (HOSPITAL_COMMUNITY): Payer: Medicare Other

## 2018-04-16 ENCOUNTER — Inpatient Hospital Stay (HOSPITAL_COMMUNITY): Payer: Medicare Other | Admitting: Occupational Therapy

## 2018-04-16 ENCOUNTER — Inpatient Hospital Stay (HOSPITAL_COMMUNITY): Payer: Medicare Other | Admitting: Physical Therapy

## 2018-04-16 DIAGNOSIS — D62 Acute posthemorrhagic anemia: Secondary | ICD-10-CM

## 2018-04-16 DIAGNOSIS — N3281 Overactive bladder: Secondary | ICD-10-CM

## 2018-04-16 DIAGNOSIS — R258 Other abnormal involuntary movements: Secondary | ICD-10-CM

## 2018-04-16 DIAGNOSIS — D696 Thrombocytopenia, unspecified: Secondary | ICD-10-CM

## 2018-04-16 DIAGNOSIS — G20 Parkinson's disease: Secondary | ICD-10-CM

## 2018-04-16 LAB — CBC WITH DIFFERENTIAL/PLATELET
Abs Immature Granulocytes: 0.05 10*3/uL (ref 0.00–0.07)
Basophils Absolute: 0 10*3/uL (ref 0.0–0.1)
Basophils Relative: 1 %
Eosinophils Absolute: 0.2 10*3/uL (ref 0.0–0.5)
Eosinophils Relative: 4 %
HCT: 38.5 % — ABNORMAL LOW (ref 39.0–52.0)
Hemoglobin: 12 g/dL — ABNORMAL LOW (ref 13.0–17.0)
Immature Granulocytes: 1 %
Lymphocytes Relative: 23 %
Lymphs Abs: 1.2 10*3/uL (ref 0.7–4.0)
MCH: 27.8 pg (ref 26.0–34.0)
MCHC: 31.2 g/dL (ref 30.0–36.0)
MCV: 89.1 fL (ref 80.0–100.0)
MONO ABS: 0.4 10*3/uL (ref 0.1–1.0)
Monocytes Relative: 8 %
NEUTROS ABS: 3.5 10*3/uL (ref 1.7–7.7)
Neutrophils Relative %: 63 %
Platelets: 101 10*3/uL — ABNORMAL LOW (ref 150–400)
RBC: 4.32 MIL/uL (ref 4.22–5.81)
RDW: 12.8 % (ref 11.5–15.5)
WBC: 5.4 10*3/uL (ref 4.0–10.5)
nRBC: 0 % (ref 0.0–0.2)

## 2018-04-16 LAB — COMPREHENSIVE METABOLIC PANEL
ALT: 8 U/L (ref 0–44)
AST: 15 U/L (ref 15–41)
Albumin: 3.6 g/dL (ref 3.5–5.0)
Alkaline Phosphatase: 91 U/L (ref 38–126)
Anion gap: 7 (ref 5–15)
BUN: 17 mg/dL (ref 8–23)
CALCIUM: 8.8 mg/dL — AB (ref 8.9–10.3)
CO2: 28 mmol/L (ref 22–32)
Chloride: 104 mmol/L (ref 98–111)
Creatinine, Ser: 1.13 mg/dL (ref 0.61–1.24)
GFR calc Af Amer: >60 mL/min (ref >60)
GFR calc non Af Amer: >60 mL/min (ref >60)
GLUCOSE: 106 mg/dL — AB (ref 70–99)
Potassium: 3.9 mmol/L (ref 3.5–5.1)
Sodium: 139 mmol/L (ref 135–145)
Total Bilirubin: 0.5 mg/dL (ref 0.3–1.2)
Total Protein: 6.2 g/dL — ABNORMAL LOW (ref 6.5–8.1)

## 2018-04-16 NOTE — Evaluation (Signed)
Occupational Therapy Assessment and Plan  Patient Details  Name: [name]. MRN: [id number] Date of Birth: [birth year]  OT Diagnosis: abnormal posture, altered mental status, cognitive deficits and muscle weakness (generalized) Rehab Potential: Rehab Potential (ACUTE ONLY): Good ELOS: 12-14 days   Today's Date: 04/16/2018 OT Individual Time: 4627-0350 OT Individual Time Calculation (min): 76 min     Problem List:  Patient Active Problem List   Diagnosis Date Noted  . Acute blood loss anemia   . Bradykinesia   . Debility 04/15/2018  . Overactive bladder   . Slow transit constipation   . Diastolic dysfunction   . Thrombocytopenia ([place])   . Hypertension   . Generalized weakness   . TIA (transient ischemic attack) 04/08/2018  . Dementia due to Parkinson's disease without behavioral disturbance ([place]) 12/16/2015  . S/P TAVR (transcatheter aortic valve replacement) 11/18/2013  . Aortic valve disorder 11/18/2013  . Parkinson's disease ([place])   . Severe aortic stenosis 09/26/2013  . Idiopathic Parkinson's disease ([place]) 10/23/2011    Past Medical History:  Past Medical History:  Diagnosis Date  . Anxiety   . Aortic stenosis   . Arthritis    left hip  . Cancer ([place])    basal cell- facial?  . Depression   . Diabetes mellitus without complication ([place])   . GERD (gastroesophageal reflux disease)   . Headache(784.0)   . Heart murmur   . Hernia, inguinal, bilateral, recurrent   . Hypertension   . Kidney stones   . Parkinson disease ([place])   . Parkinson's disease (tremor, stiffness, slow motion, unstable posture) ([place]) 12/11/2006  . PONV (postoperative nausea and vomiting)   . Restless leg syndrome   . S/P TAVR (transcatheter aortic valve replacement) 11/18/2013   26 mm Edwards Sapien XT transcatheter heart valve placed via open right transfemoral approach  . TIA (transient ischemic attack) 02/08/2007   Past Surgical History:  Past Surgical History:  Procedure  Laterality Date  . CARDIAC CATHETERIZATION  10/09/13  . cataract  2008   bilateral  . COLONOSCOPY    . CYSTOSCOPY/RETROGRADE/URETEROSCOPY  05/29/2011   Procedure: CYSTOSCOPY/RETROGRADE/URETEROSCOPY;  Surgeon: Malka So, MD;  Location: [facility];  Service: Urology;  Laterality: Left;  no stent   . EYE SURGERY Bilateral    /w IOL  . INGUINAL HERNIA REPAIR Bilateral 03/30/2014   Procedure: OPEN BILATERAL INGUINAL HERNIA REPAIR WITH MESH;  Surgeon: Coralie Keens, MD;  Location: [place];  Service: General;  Laterality: Bilateral;  . INSERTION OF MESH Bilateral 03/30/2014   Procedure: INSERTION OF MESH;  Surgeon: Coralie Keens, MD;  Location: [place];  Service: General;  Laterality: Bilateral;  . INTRAOPERATIVE TRANSESOPHAGEAL ECHOCARDIOGRAM N/A 11/18/2013   Procedure: INTRAOPERATIVE TRANSESOPHAGEAL ECHOCARDIOGRAM;  Surgeon: Sherren Mocha, MD;  Location: [facility] OR;  Service: Open Heart Surgery;  Laterality: N/A;  . LEFT AND RIGHT HEART CATHETERIZATION WITH CORONARY ANGIOGRAM N/A 10/09/2013   Procedure: LEFT AND RIGHT HEART CATHETERIZATION WITH CORONARY ANGIOGRAM;  Surgeon: Blane Ohara, MD;  Location: [facility] CATH LAB;  Service: Cardiovascular;  Laterality: N/A;  . TRANSCATHETER AORTIC VALVE REPLACEMENT, TRANSFEMORAL N/A 11/18/2013   Procedure: TRANSCATHETER AORTIC VALVE REPLACEMENT, TRANSFEMORAL;  Surgeon: Sherren Mocha, MD;  Location: [place];  Service: Open Heart Surgery;  Laterality: N/A;    Assessment & Plan Clinical Impression: Patient is a 77 y.o. year old male with recent admission to the hospital on 04/08/2018 with significant difficulty in gait with bradykinesia. Patient with history of lid opening apraxia with plans for Botox  in the future. MRI of the brain reviewed, showing atrophy. Per report, cerebral atrophy with small hygromas without mass effect. Echocardiogram with ejection fraction of 92% grade 1 diastolic dysfunction. Carotid Dopplers with no ICA stenosis. He did have a KUB revealing large  volume stool filling the entire colon. Neurology consulted with recommendations of adjusting his Sinemet. Subcutaneous Lovenox for DVT prophylaxis. Tolerating a regular consistency diet.  Patient transferred to CIR on 04/15/2018 .    Patient currently requires mod with basic self-care skills secondary to muscle weakness and muscle joint tightness, impaired timing and sequencing and decreased coordination, decreased attention, decreased awareness, decreased safety awareness and decreased memory and decreased sitting balance, decreased standing balance, decreased postural control and decreased balance strategies.  Prior to hospitalization, patient could complete ADLs with modified independent .  Patient will benefit from skilled intervention to decrease level of assist with basic self-care skills and increase independence with basic self-care skills prior to discharge home with care partner.  Anticipate patient will require 24 hour supervision and follow up home health.  OT - End of Session Activity Tolerance: Tolerates 10 - 20 min activity with multiple rests Endurance Deficit: Yes OT Assessment Rehab Potential (ACUTE ONLY): Good OT Barriers to Discharge: Decreased caregiver support OT Patient demonstrates impairments in the following area(s): Balance;Cognition;Endurance;Motor;Safety OT Basic ADL's Functional Problem(s): Grooming;Bathing;Dressing;Eating;Toileting OT Transfers Functional Problem(s): Tub/Shower;Toilet OT Additional Impairment(s): None OT Plan OT Intensity: Minimum of 1-2 x/day, 45 to 90 minutes OT Frequency: 5 out of 7 days OT Duration/Estimated Length of Stay: 12-14 days OT Treatment/Interventions: Balance/vestibular training;Discharge planning;Neuromuscular re-education;Patient/family education;Self Care/advanced ADL retraining;Therapeutic Exercise;UE/LE Coordination activities;DME/adaptive equipment instruction;Functional mobility training;Therapeutic Activities;UE/LE Strength  taining/ROM;Disease mangement/prevention;Community reintegration;Cognitive remediation/compensation OT Self Feeding Anticipated Outcome(s): independent OT Basic Self-Care Anticipated Outcome(s): supervision OT Toileting Anticipated Outcome(s): supervision OT Bathroom Transfers Anticipated Outcome(s): supervision   Skilled Therapeutic Intervention Pt worked on selfcare retraining sit to stand during session.  Pt repeatedly attempting to get up OOB while therapist was instructing him on what he would work on during session.  Max instructional cueing needed for him to wait until therapist was ready.  Min assist for transition to the EOB with mod assist for functional mobility to the shower bench.  Once in the shower pt demonstrated confusion, attempting to get up from the shower seat and walk over to the door to turn on the bathroom vent, even though no vent is present.  He needed mod instructional cueing for sequencing shower with sit to stand at min assist level using the grab bar.  Pt able to state that he was at "[facility]", but not oriented to why his is here.  He was also incorrect on the year and day of the week, when asked during shower.  Mod assist from therapist to sequence through bath and for rinsing off with hand held shower.  Therapist had him transfer over to the 3:1 in the bathroom for dressing.  Pt able to donn incontinence brief in sitting with min assist for standing to pull over his hips.  He attempted to stand to donn pants, stepping in one leg at a time, while stabilizing his legs against the 3:1.  Therapist attempted to re-direct him to sit, however, he just remained standing.  Min assist needed to maintain balance and thread the RLE.  He donned gripper socks in sitting.  Finished session with ambulation back out to the EOB with mod assist and transition to supine with min assist.  Pt's daughter [name], present for part of  session and familiar with pt's normal performance and ADLs and his  current one based on medical issues.  She is hopeful these will resolve themselves soon and he can go home with them arranging for 24 hour care.  Call button in reach and bed alarm in place.    OT Evaluation Precautions/Restrictions  Precautions Precautions: Fall Restrictions Weight Bearing Restrictions: No   Pain Pain Assessment Pain Scale: Faces Pain Score: 0-No pain Home Living/Prior Functioning Home Living Family/patient expects to be discharged to:: Private residence Living Arrangements: Alone Available Help at Discharge: Family, Available 24 hours/day Type of Home: House Home Access: Ramped entrance Home Layout: Two level, Able to live on main level with bedroom/bathroom Bathroom Shower/Tub: Multimedia programmer: Standard  Lives With: Alone IADL History Homemaking Responsibilities: No Occupation: Retired Prior Function Level of Independence: Independent with basic ADLs, Independent with transfers Driving: No Vocation: Retired Comments: uses cane both in home and community, would go out in the community frequently w/ friends/family ADL ADL Eating: Maximal assistance Where Assessed-Eating: Wheelchair Grooming: Supervision/safety Where Assessed-Grooming: Bed level Upper Body Bathing: Supervision/safety Where Assessed-Upper Body Bathing: Shower Lower Body Bathing: Minimal assistance Where Assessed-Lower Body Bathing: Shower Upper Body Dressing: Minimal assistance Where Assessed-Upper Body Dressing: Chair Lower Body Dressing: Moderate assistance Where Assessed-Lower Body Dressing: Chair Toileting: Moderate assistance Toilet Transfer: Moderate assistance Toilet Transfer Method: Counselling psychologist: Grab bars, Raised toilet seat Tub/Shower Transfer: Moderate assistance Tub/Shower Transfer Method: Optometrist: Walk in Retail buyer: Minimal assistance Social research officer, government Method:  Heritage manager: Radio broadcast assistant Vision Baseline Vision/History: Wears glasses Wears Glasses: At all times Patient Visual Report: Other (comment)(Pt closing eyes more secondary to eye lid apraxia) Vision Assessment?: No apparent visual deficits Perception  Comments: hallucinations during sleep, thinking he was eating something Praxis Praxis: Intact Cognition Overall Cognitive Status: Impaired/Different from baseline Arousal/Alertness: Awake/alert Orientation Level: Person;Place;Situation Year: (14) Month: December Day of Week: Incorrect(Thursday) Memory: Impaired Memory Impairment: Decreased recall of new information;Decreased short term memory Decreased Short Term Memory: Verbal basic;Functional basic Immediate Memory Recall: Sock;Blue;Bed Memory Recall: Blue Memory Recall Blue: Without Cue Attention: Sustained Sustained Attention: Impaired Sustained Attention Impairment: Functional basic;Verbal basic Awareness: Impaired Awareness Impairment: Anticipatory impairment;Emergent impairment;Intellectual impairment Problem Solving: Impaired Problem Solving Impairment: Verbal basic;Functional basic Behaviors: Perseveration Safety/Judgment: Impaired Comments: Pt continually trying to stand to place his legs in his pants, even though he was unsuccessful on 2 attempts.   Sensation Sensation Light Touch: Appears Intact Hot/Cold: Not tested Proprioception: Not tested Stereognosis: Not tested Coordination Gross Motor Movements are Fluid and Coordinated: No Fine Motor Movements are Fluid and Coordinated: No Coordination and Movement Description: Parkinsonian tremors w/ volitional movement and functional use.  He was unable to fasten the buttons on his shirt after several attempts. Motor  Motor Motor: Abnormal tone Motor - Skilled Clinical Observations: Parkinsonian tremors , generalized weakness  Mobility  Bed Mobility Bed Mobility: Rolling Right;Rolling  Left;Supine to Sit;Sit to Supine Rolling Right: Minimal Assistance - Patient > 75% Rolling Left: Minimal Assistance - Patient > 75% Supine to Sit: Minimal Assistance - Patient > 75% Sit to Supine: Minimal Assistance - Patient > 75% Transfers Sit to Stand: Minimal Assistance - Patient > 75% Stand to Sit: Minimal Assistance - Patient > 75%  Trunk/Postural Assessment  Cervical Assessment Cervical Assessment: Exceptions to WFL(forward cervical flexion) Thoracic Assessment Thoracic Assessment: Exceptions to WFL(thoracic kyphosis) Lumbar Assessment Lumbar Assessment: Exceptions to WFL(maintains posterior pelvic tilt)  Balance Balance Balance Assessed:  Yes Static Sitting Balance Static Sitting - Balance Support: No upper extremity supported;Feet supported Static Sitting - Level of Assistance: 5: Stand by assistance Dynamic Sitting Balance Dynamic Sitting - Balance Support: During functional activity Dynamic Sitting - Level of Assistance: 4: Min assist Static Standing Balance Static Standing - Balance Support: During functional activity Static Standing - Level of Assistance: 4: Min assist Dynamic Standing Balance Dynamic Standing - Balance Support: During functional activity Dynamic Standing - Level of Assistance: 3: Mod assist Extremity/Trunk Assessment RUE Assessment RUE Assessment: Exceptions to [facility] Active Range of Motion (AROM) Comments: AROM shoulder flexion 0-130 degrees bilaterally.  All other joints AROM WFLS with slight tremor noted and occasional jerking or arms.  Strength throughout 4/5 LUE Assessment LUE Assessment: Exceptions to [facility] Active Range of Motion (AROM) Comments: AROM shoulder flexion 0-130 degrees bilaterally.  All other joints AROM WFLS with slight tremor noted and occasional jerking or arms.  Strength throughout 4/5     Refer to Care Plan for Long Term Goals  Recommendations for other services: None    Discharge Criteria: Patient will be discharged from  OT if patient refuses treatment 3 consecutive times without medical reason, if treatment goals not met, if there is a change in medical status, if patient makes no progress towards goals or if patient is discharged from hospital.  The above assessment, treatment plan, treatment alternatives and goals were discussed and mutually agreed upon: by patient and by family  Truda Staub,Charlee OTR/L 04/16/2018, 4:51 PM

## 2018-04-16 NOTE — Progress Notes (Signed)
Inpatient Rehabilitation  Patient information reviewed and entered into eRehab system by Hodari Chuba M. Brytney Somes, M.A., CCC/SLP, PPS Coordinator.  Information including medical coding, functional ability and quality indicators will be reviewed and updated through discharge.    Per nursing patient was given "Data Collection Information Summary" for Patients in Inpatient Rehabilitation Facilities with attached "Privacy Act Statement-Health Care Records" upon admission.   

## 2018-04-16 NOTE — Evaluation (Signed)
Speech Language Pathology Assessment and Plan  Patient Details  Name: [name]. MRN: [id number] Date of Birth: 12/23/1940  SLP Diagnosis: Cognitive Impairments  Rehab Potential: Good ELOS: 10-14 days     Today's Date: 04/16/2018 SLP Individual Time: 1000-1100 SLP Individual Time Calculation (min): 60 min   Problem List:  Patient Active Problem List   Diagnosis Date Noted  . Acute blood loss anemia   . Bradykinesia   . Debility 04/15/2018  . Overactive bladder   . Slow transit constipation   . Diastolic dysfunction   . Thrombocytopenia ([place])   . Hypertension   . Generalized weakness   . TIA (transient ischemic attack) 04/08/2018  . Dementia due to Parkinson's disease without behavioral disturbance ([place]) 12/16/2015  . S/P TAVR (transcatheter aortic valve replacement) 11/18/2013  . Aortic valve disorder 11/18/2013  . Parkinson's disease ([place])   . Severe aortic stenosis 09/26/2013  . Idiopathic Parkinson's disease ([place]) 10/23/2011   Past Medical History:  Past Medical History:  Diagnosis Date  . Anxiety   . Aortic stenosis   . Arthritis    left hip  . Cancer ([place])    basal cell- facial?  . Depression   . Diabetes mellitus without complication ([place])   . GERD (gastroesophageal reflux disease)   . Headache(784.0)   . Heart murmur   . Hernia, inguinal, bilateral, recurrent   . Hypertension   . Kidney stones   . Parkinson disease ([place])   . Parkinson's disease (tremor, stiffness, slow motion, unstable posture) ([place]) 12/11/2006  . PONV (postoperative nausea and vomiting)   . Restless leg syndrome   . S/P TAVR (transcatheter aortic valve replacement) 11/18/2013   26 mm Edwards Sapien XT transcatheter heart valve placed via open right transfemoral approach  . TIA (transient ischemic attack) 02/08/2007   Past Surgical History:  Past Surgical History:  Procedure Laterality Date  . CARDIAC CATHETERIZATION  10/09/13  . cataract  2008   bilateral  . COLONOSCOPY     . CYSTOSCOPY/RETROGRADE/URETEROSCOPY  05/29/2011   Procedure: CYSTOSCOPY/RETROGRADE/URETEROSCOPY;  Surgeon: Malka So, MD;  Location: [facility];  Service: Urology;  Laterality: Left;  no stent   . EYE SURGERY Bilateral    /w IOL  . INGUINAL HERNIA REPAIR Bilateral 03/30/2014   Procedure: OPEN BILATERAL INGUINAL HERNIA REPAIR WITH MESH;  Surgeon: Coralie Keens, MD;  Location: [place];  Service: General;  Laterality: Bilateral;  . INSERTION OF MESH Bilateral 03/30/2014   Procedure: INSERTION OF MESH;  Surgeon: Coralie Keens, MD;  Location: [place];  Service: General;  Laterality: Bilateral;  . INTRAOPERATIVE TRANSESOPHAGEAL ECHOCARDIOGRAM N/A 11/18/2013   Procedure: INTRAOPERATIVE TRANSESOPHAGEAL ECHOCARDIOGRAM;  Surgeon: Sherren Mocha, MD;  Location: [facility] OR;  Service: Open Heart Surgery;  Laterality: N/A;  . LEFT AND RIGHT HEART CATHETERIZATION WITH CORONARY ANGIOGRAM N/A 10/09/2013   Procedure: LEFT AND RIGHT HEART CATHETERIZATION WITH CORONARY ANGIOGRAM;  Surgeon: Blane Ohara, MD;  Location: [facility] CATH LAB;  Service: Cardiovascular;  Laterality: N/A;  . TRANSCATHETER AORTIC VALVE REPLACEMENT, TRANSFEMORAL N/A 11/18/2013   Procedure: TRANSCATHETER AORTIC VALVE REPLACEMENT, TRANSFEMORAL;  Surgeon: Sherren Mocha, MD;  Location: [place];  Service: Open Heart Surgery;  Laterality: N/A;    Assessment / Plan / Recommendation Clinical Impression SHADRACH BARTUNEK is a 77 year old right-handed male with history of diabetes mellitus, headaches, aortic stenosis status post TAVR procedure , Parkinson's disease maintained on Sinemet/Aricept with baseline mild cognitive deficits. Presented 04/08/2018 with significant difficulty in gait with bradykinesia. Patient with history of  lid opening apraxia with plans for Botox in the future. MRI of the brain reviewed, showing atrophy. Per report, cerebral atrophy with small hygromas without mass effect. Echocardiogram with ejection fraction of 24% grade 1 diastolic  dysfunction. Carotid Dopplers with no ICA stenosis. He did have a KUB revealing large volume stool filling the entire colon. Neurology consulted with recommendations of adjusting his Sinemet. Subcutaneous Lovenox for DVT prophylaxis. Tolerating a regular consistency diet   Pt presents with sever cognitive linguistic impairments, deficits include sustained attention (eyes remained eyes for majority of session), recall, orientation, basic problem solving, intellectual/safety awareness, language of confusion, ineffective expression of wants/needs and reduced intelligibility of speech at phrase level, which was further supported by informal cognitive linguistic assessment. SLP attempted administration of MOCA version 7.1, however unsuccessful due to attention deficits. Pt presented as extremely lethargic, falling asleep several times resulting in jolting tremors. Pt has a history of mild-moderate memory and orientation deficits, with mild word finding and 80% intelligibility speech at sentence level according to pt's daughter. SLP recommends regular and thin textured diet with full supervision due to cognition. Pt would benefit from skilled ST services in order to maximize functional independence and reduce burden of care, likely requiring 24 hour supervision at discharge with continued skilled ST services.   Skilled Therapeutic Interventions          Skilled ST services focused on cognitive skills and education. SLP facilitated informal cogntive linguistic assessment, attempted to complete MOCA version 7.1, however due to reduced sustained attention it was unsuccessful. SLP communicated with pt's daughter about baseline abilities and changes in current status. Pt's daughter advocated for pt's need for consistent sleep to assist with cognitive function. SLP provided education of informal assessment and all questions were answered to satisfaction.  Pt was left in room with call bell within reach,daughter present and  chair alarm set. SLP reccomends to continue skilled services.   SLP Assessment  Patient will need skilled Speech Lanaguage Pathology Services during CIR admission    Recommendations  SLP Diet Recommendations: Thin Liquid Administration via: Straw;Cup Medication Administration: Whole meds with liquid Supervision: Full supervision/cueing for compensatory strategies(due to cognition ) Compensations: Minimize environmental distractions;Slow rate;Small sips/bites Postural Changes and/or Swallow Maneuvers: Seated upright 90 degrees Oral Care Recommendations: Oral care BID Patient destination: Home Follow up Recommendations: 24 hour supervision/assistance;Home Health SLP;Outpatient SLP Equipment Recommended: None recommended by SLP    SLP Frequency 3 to 5 out of 7 days   SLP Duration  SLP Intensity  SLP Treatment/Interventions 10-14 days   Minumum of 1-2 x/day, 30 to 90 minutes  Cognitive remediation/compensation;Cueing hierarchy;Functional tasks;Speech/Language facilitation;Patient/family education    Pain Pain Assessment Pain Scale: Faces Pain Score: 0-No pain  Prior Functioning Cognitive/Linguistic Baseline: Baseline deficits Baseline deficit details: memory/orientation/word finding  Type of Home: House  Lives With: Alone Available Help at Discharge: Family;Available 24 hours/day Vocation: Retired  Industrial/product designer Term Goals: Week 1: SLP Short Term Goal 1 (Week 1): Pt will demonstrate sustained attention to function task in 2 minute intervals with mod A verbal cues. SLP Short Term Goal 2 (Week 1): Pt will express wants/needs in response to verbal questions with mod A verbal cues. SLP Short Term Goal 3 (Week 1): Pt will utilize speech intelligibility strategies at phrase level with max A verbal cues to achieve 50% intelligibility. SLP Short Term Goal 4 (Week 1): Pt will demonstrate basic problem solving in functional task with Mod A verbal cues.  Refer to Care Plan for Long Term  Goals  Recommendations for other services: None   Discharge Criteria: Patient will be discharged from SLP if patient refuses treatment 3 consecutive times without medical reason, if treatment goals not met, if there is a change in medical status, if patient makes no progress towards goals or if patient is discharged from hospital.  The above assessment, treatment plan, treatment alternatives and goals were discussed and mutually agreed upon: by patient  Braeden Kennan  [facility] 04/16/2018, 4:40 PM

## 2018-04-16 NOTE — Plan of Care (Signed)
  Problem: RH BOWEL ELIMINATION Goal: RH STG MANAGE BOWEL W/MEDICATION W/ASSISTANCE Description STG Manage Bowel with Medication with Assistance. mod  Outcome: Progressing   Problem: RH SKIN INTEGRITY Goal: RH STG SKIN FREE OF INFECTION/BREAKDOWN Description Skin to remain free of breakdown and infection on rehab with mod assist  Outcome: Progressing   Problem: RH SAFETY Goal: RH STG ADHERE TO SAFETY PRECAUTIONS W/ASSISTANCE/DEVICE Description STG Adhere to Safety Precautions With Assistance/Device. Mod assist  Outcome: Progressing Goal: RH STG DECREASED RISK OF FALL WITH ASSISTANCE Description STG Decreased Risk of Fall With Assistance. Mod assist  Outcome: Progressing   Problem: RH BOWEL ELIMINATION Goal: RH STG MANAGE BOWEL WITH ASSISTANCE Description STG Manage Bowel with Assistance. mod  Outcome: Not Progressing   Problem: RH BLADDER ELIMINATION Goal: RH STG MANAGE BLADDER WITH ASSISTANCE Description STG Manage Bladder With Assistance. Mod  Outcome: Not Progressing

## 2018-04-16 NOTE — Evaluation (Addendum)
Physical Therapy Assessment and Plan  Patient Details  Name: [name]. MRN: [id number] Date of Birth: 07/28/1940  PT Diagnosis: Abnormal posture, Abnormality of gait, Cognitive deficits, Coordination disorder, Difficulty walking, Impaired cognition and Muscle weakness Rehab Potential: Good ELOS: 12-14 days   Today's Date: 04/16/2018 PT Individual Time: 0900-1000 PT Individual Time Calculation (min): 60 min    Problem List:  Patient Active Problem List   Diagnosis Date Noted  . Acute blood loss anemia   . Bradykinesia   . Debility 04/15/2018  . Overactive bladder   . Slow transit constipation   . Diastolic dysfunction   . Thrombocytopenia ([place])   . Hypertension   . Generalized weakness   . TIA (transient ischemic attack) 04/08/2018  . Dementia due to Parkinson's disease without behavioral disturbance ([place]) 12/16/2015  . S/P TAVR (transcatheter aortic valve replacement) 11/18/2013  . Aortic valve disorder 11/18/2013  . Parkinson's disease ([place])   . Severe aortic stenosis 09/26/2013  . Idiopathic Parkinson's disease ([place]) 10/23/2011    Past Medical History:  Past Medical History:  Diagnosis Date  . Anxiety   . Aortic stenosis   . Arthritis    left hip  . Cancer ([place])    basal cell- facial?  . Depression   . Diabetes mellitus without complication ([place])   . GERD (gastroesophageal reflux disease)   . Headache(784.0)   . Heart murmur   . Hernia, inguinal, bilateral, recurrent   . Hypertension   . Kidney stones   . Parkinson disease ([place])   . Parkinson's disease (tremor, stiffness, slow motion, unstable posture) ([place]) 12/11/2006  . PONV (postoperative nausea and vomiting)   . Restless leg syndrome   . S/P TAVR (transcatheter aortic valve replacement) 11/18/2013   26 mm Edwards Sapien XT transcatheter heart valve placed via open right transfemoral approach  . TIA (transient ischemic attack) 02/08/2007   Past Surgical History:  Past Surgical History:   Procedure Laterality Date  . CARDIAC CATHETERIZATION  10/09/13  . cataract  2008   bilateral  . COLONOSCOPY    . CYSTOSCOPY/RETROGRADE/URETEROSCOPY  05/29/2011   Procedure: CYSTOSCOPY/RETROGRADE/URETEROSCOPY;  Surgeon: Malka So, MD;  Location: [facility];  Service: Urology;  Laterality: Left;  no stent   . EYE SURGERY Bilateral    /w IOL  . INGUINAL HERNIA REPAIR Bilateral 03/30/2014   Procedure: OPEN BILATERAL INGUINAL HERNIA REPAIR WITH MESH;  Surgeon: Coralie Keens, MD;  Location: [facility];  Service: General;  Laterality: Bilateral;  . INSERTION OF MESH Bilateral 03/30/2014   Procedure: INSERTION OF MESH;  Surgeon: Coralie Keens, MD;  Location: [facility];  Service: General;  Laterality: Bilateral;  . INTRAOPERATIVE TRANSESOPHAGEAL ECHOCARDIOGRAM N/A 11/18/2013   Procedure: INTRAOPERATIVE TRANSESOPHAGEAL ECHOCARDIOGRAM;  Surgeon: Sherren Mocha, MD;  [facility] OR;  Service: Open Heart Surgery;  Laterality: N/A;  . LEFT AND RIGHT HEART CATHETERIZATION WITH CORONARY ANGIOGRAM N/A 10/09/2013   Procedure: LEFT AND RIGHT HEART CATHETERIZATION WITH CORONARY ANGIOGRAM;  Surgeon: Blane Ohara, MD;  Location: [facility] CATH LAB;  Service: Cardiovascular;  Laterality: N/A;  . TRANSCATHETER AORTIC VALVE REPLACEMENT, TRANSFEMORAL N/A 11/18/2013   Procedure: TRANSCATHETER AORTIC VALVE REPLACEMENT, TRANSFEMORAL;  Surgeon: Sherren Mocha, MD;  Location: [facility];  Service: Open Heart Surgery;  Laterality: N/A;    Assessment & Plan Clinical Impression: Patient is a 77 year old right-handed male with history of  headaches, aortic stenosis status post TAVR procedure , Parkinson's disease followed by neurology services [name] maintained on Sinemet/Aricept with baseline mild cognitive  deficits. 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 2 level home with bedroom on main. Ramped entry of home. He has a son and daughter in the area as well as a  sister next door. Family arranging 24-hour assistance. Family did assist with preparing meals. Presented 04/08/2018 with significant difficulty in gait with bradykinesia. Patient with history of lid opening apraxia with plans for Botox in the future. MRI of the brain reviewed, showing atrophy. Per report, cerebral atrophy with small hygromas without mass effect. Echocardiogram with ejection fraction of 27% grade 1 diastolic dysfunction. Carotid Dopplers with no ICA stenosis. He did have a KUB revealing large volume stool filling the entire colon. Neurology consulted with recommendations of adjusting his Sinemet. Subcutaneous Lovenox for DVT prophylaxis. Tolerating a regular consistency diet.Therapy evaluations completed with recommendations of physical medicine rehabilitation consult. Patient transferred to CIR on 04/15/2018 .   Patient currently requires mod with mobility secondary to muscle weakness and muscle joint tightness, decreased cardiorespiratoy endurance, abnormal tone, unbalanced muscle activation and decreased coordination, decreased visual awareness 2/2 apraxic lid opening, decreased awareness, decreased problem solving, decreased safety awareness and decreased memory and decreased sitting balance, decreased standing balance, decreased postural control and decreased balance strategies.  Prior to hospitalization, patient was modified independent  with mobility and lived with Alone in a House home.  Home access is  Ramped entrance.  Patient will benefit from skilled PT intervention to maximize safe functional mobility, minimize fall risk and decrease caregiver burden for planned discharge home with 24 hour supervision.  Anticipate patient will benefit from follow up [place] at discharge.  PT - End of Session Activity Tolerance: Tolerates 10 - 20 min activity with multiple rests Endurance Deficit: Yes Endurance Deficit Description: decreased w/ all functional movement, fell asleep multiple times during  eval PT Assessment Rehab Potential (ACUTE/IP ONLY): Good PT Barriers to Discharge: Medical stability PT Patient demonstrates impairments in the following area(s): Balance;Endurance;Motor;Safety;Perception PT Transfers Functional Problem(s): Bed Mobility;Bed to Chair;Car;Furniture;Floor PT Locomotion Functional Problem(s): Stairs;Ambulation;Wheelchair Mobility PT Plan PT Intensity: Minimum of 1-2 x/day ,45 to 90 minutes PT Frequency: 5 out of 7 days PT Duration Estimated Length of Stay: 7-10 days? PT Treatment/Interventions: Visual/perceptual remediation/compensation;Psychosocial support;Therapeutic Activities;Functional mobility training;Discharge planning;Ambulation/gait training;Balance/vestibular training;Disease management/prevention;Neuromuscular re-education;Skin care/wound management;Therapeutic Exercise;Wheelchair propulsion/positioning;UE/LE Strength taining/ROM;Splinting/orthotics;Pain management;DME/adaptive equipment instruction;Cognitive remediation/compensation;Community reintegration;Functional electrical stimulation;Patient/family education;Stair training;UE/LE Coordination activities PT Transfers Anticipated Outcome(s): supervision w/ LRAD PT Locomotion Anticipated Outcome(s): supervision w/ LRAD PT Recommendation Follow Up Recommendations: Home health PT Patient destination: Home Equipment Recommended: To be determined Equipment Details: has RW, cane, and transport chair already  Skilled Therapeutic Intervention  Pt in supine and agreeable to therapy, denies pain. Brief noted to be soiled, provided total assist for pericare and changing brief in supine for time management. Performed bed mobility as detailed below. Additionally performed functional mobility as detailed below including transfers, gait, w/c mobility, and car transfer. Required increased time and mod verbal, tactile, and manual cues for initiation, sequencing of movement, and safety awareness. Pt volitionally  opening eyelids when conversing with therapist, but rests w/ eyes closed and falls asleep easily, jerking himself awake multiple times during session. Pt's daughter present and provided PLOF information and asked appropriate questions. Educated daughter and pt on d/c recommendations, CLOF and LTGs, and CIR policies including using quick release alarm belt when seated in w/c. Both verbalized understanding and in agreement. Ended session in w/c and in care of daughter, all needs  met.   PT Evaluation Precautions/Restrictions Precautions Precautions: Fall Restrictions Weight Bearing Restrictions: No Pain Pain Assessment Pain Scale: 0-10 Pain Score: 0-No pain Home Living/Prior Functioning Home Living Available Help at Discharge: Family;Available 24 hours/day(children, other family, and friends able to arrange for 24/7 assist (daughter plans to coordinate this)) Type of Home: House Home Access: Ramped entrance Home Layout: Two level;Able to live on main level with bedroom/bathroom Bathroom Shower/Tub: Multimedia programmer: Standard  Lives With: Alone Prior Function Level of Independence: Independent with transfers;Independent with basic ADLs;Requires assistive device for independence;Needs assistance with homemaking;Independent with gait(needs assistance w/ cooking/cleaning, children check in daily) Driving: No(has not driven since 0940) Vocation: Retired Comments: uses cane both in home and community, would go out in the community frequently w/ friends/family Vision/Perception  Perception Perception: Within Functional Limits Praxis Praxis: Intact  Cognition Overall Cognitive Status: Impaired/Different from baseline Arousal/Alertness: Awake/alert Orientation Level: Oriented to person;Oriented to situation(thought it was in a school) Sustained Attention: Impaired Memory: Impaired Memory Impairment: Decreased recall of new information Awareness: Appears intact Problem  Solving: Impaired Safety/Judgment: Impaired Comments: mild impulsivity, decreased awareness of deficits and environment, suspect some 2/2 eyelids being closed part of the time Sensation Sensation Light Touch: Appears Intact Coordination Gross Motor Movements are Fluid and Coordinated: No Fine Motor Movements are Fluid and Coordinated: No Coordination and Movement Description: Parkinsonian tremors w/ volitional movement Heel Shin Test: Impaired bilaterally Motor  Motor Motor: Abnormal tone Motor - Skilled Clinical Observations: Parkinsonian tremors , generalized weakness   Mobility Bed Mobility Bed Mobility: Rolling Right;Rolling Left;Supine to Sit;Sit to Supine Rolling Right: Minimal Assistance - Patient > 75% Rolling Left: Minimal Assistance - Patient > 75% Supine to Sit: Minimal Assistance - Patient > 75% Sit to Supine: Minimal Assistance - Patient > 75% Transfers Transfers: Sit to Stand;Stand to Sit;Stand Pivot Transfers Sit to Stand: Minimal Assistance - Patient > 75% Stand to Sit: Minimal Assistance - Patient > 75% Stand Pivot Transfers: Moderate Assistance - Patient 50 - 74% Stand Pivot Transfer Details: Manual facilitation for weight shifting;Manual facilitation for placement;Tactile cues for initiation;Tactile cues for posture;Verbal cues for precautions/safety;Verbal cues for technique Stand Pivot Transfer Details (indicate cue type and reason): mod assist 2/2 skilled cues needed  Transfer (Assistive device): 1 person hand held assist Locomotion  Gait Ambulation: Yes Gait Assistance: Minimal Assistance - Patient > 75% Gait Distance (Feet): 100 Feet Assistive device: Rolling walker Gait Assistance Details: Tactile cues for posture;Verbal cues for safe use of DME/AE;Manual facilitation for weight shifting;Verbal cues for gait pattern;Verbal cues for precautions/safety;Tactile cues for initiation Gait Gait: Yes Gait Pattern: Impaired Gait Pattern:  Shuffle;Festinating;Narrow base of support;Poor foot clearance - right;Poor foot clearance - left Gait velocity: decreased Stairs / Additional Locomotion Stairs: No Wheelchair Mobility Wheelchair Mobility: Yes Wheelchair Assistance: Chartered loss adjuster: Both upper extremities Wheelchair Parts Management: Needs assistance Distance: 100'  Trunk/Postural Assessment  Cervical Assessment Cervical Assessment: Exceptions to WFL(forward head) Thoracic Assessment Thoracic Assessment: Exceptions to WFL(kyphotic) Lumbar Assessment Lumbar Assessment: Exceptions to WFL(posterior pelvic tilt) Postural Control Postural Control: Deficits on evaluation(posterior lean bias)  Balance Balance Balance Assessed: Yes Static Sitting Balance Static Sitting - Balance Support: No upper extremity supported;Feet supported Static Sitting - Level of Assistance: 5: Stand by assistance Dynamic Sitting Balance Dynamic Sitting - Balance Support: Feet supported;No upper extremity supported Dynamic Sitting - Level of Assistance: 4: Min Insurance risk surveyor Standing - Balance Support: During functional activity;Bilateral upper extremity supported Static Standing - Level of Assistance: 4:  Min assist Dynamic Standing Balance Dynamic Standing - Balance Support: Bilateral upper extremity supported;During functional activity Dynamic Standing - Level of Assistance: 3: Mod assist Extremity Assessment  RLE Assessment RLE Assessment: Exceptions to [facility] Passive Range of Motion (PROM) Comments: hamstring tightness General Strength Comments: 5/5 globally LLE Assessment LLE Assessment: Exceptions to [facility] Passive Range of Motion (PROM) Comments: hamstring tightness General Strength Comments: 5/5 globally    Refer to Care Plan for Long Term Goals  Recommendations for other services: None   Discharge Criteria: Patient will be discharged from PT if patient refuses treatment 3  consecutive times without medical reason, if treatment goals not met, if there is a change in medical status, if patient makes no progress towards goals or if patient is discharged from hospital.  The above assessment, treatment plan, treatment alternatives and goals were discussed and mutually agreed upon: by patient and by family  Enrique Manganaro Clent Demark 04/16/2018, 12:17 PM

## 2018-04-16 NOTE — Progress Notes (Signed)
[place] PHYSICAL MEDICINE & REHABILITATION PROGRESS NOTE  Subjective/Complaints: Patient seen sitting up in bed this morning.  He states he did not sleep well overnight because?  His chair was swinging around.  Family with several questions regarding Zoloft, jerking movements, nighttime medication timings, Lovenox.  ROS: Denies CP, SOB, N/V/D.  Objective: Vital Signs: Blood pressure 115/72, pulse 66, temperature 98 F (36.7 C), temperature source Oral, resp. rate 18, height 5\' 6"  (1.676 m), weight 58.1 kg, SpO2 96 %. No results found. Recent Labs    04/15/18 1616 04/16/18 0447  WBC 5.6 5.4  HGB 12.0* 12.0*  HCT 38.1* 38.5*  PLT 106* 101*   Recent Labs    04/15/18 1616 04/16/18 0447  NA  --  139  K  --  3.9  CL  --  104  CO2  --  28  GLUCOSE  --  106*  BUN  --  17  CREATININE 1.27* 1.13  CALCIUM  --  8.8*    Physical Exam: BP 115/72 (BP Location: Right Arm)   Pulse 66   Temp 98 F (36.7 C) (Oral)   Resp 18   Ht 5\' 6"  (1.676 m)   Wt 58.1 kg   SpO2 96%   BMI 20.67 kg/m  Gen: NAD. Frail. HENT: Normocephalic and atraumatic.  Eyes: EOMI. No discharge.   Cardiovascular: Normal rate and regular rhythm. No JVD. Respiratory: Effort normal and breath sounds normal. No respiratory distress.  GI: Bowel sounds are normal. He exhibits no distension.  Musculoskeletal: No edema or tenderness in extremities  Neurological:  Alert Makes good eye contact with examiner.  Limited overall medical historian.  Follow simple commands. Motor: Grossly 4+/5 throughout, limited by bradykinesia and rigidity  Skin: Skin is warm and dry.  Psychiatric: His affect is blunt. His speech is delayed and slurred. He is slowed. Cognition and memory are impaired.   Assessment/Plan: 1. Functional deficits secondary to Parkinson's disease which require 3+ hours per day of interdisciplinary therapy in a comprehensive inpatient rehab setting.  Physiatrist is providing close team supervision and  24 hour management of active medical problems listed below.  Physiatrist and rehab team continue to assess barriers to discharge/monitor patient progress toward functional and medical goals  Care Tool:  Bathing              Bathing assist       Upper Body Dressing/Undressing Upper body dressing        Upper body assist      Lower Body Dressing/Undressing Lower body dressing            Lower body assist       Toileting Toileting    Toileting assist Assist for toileting: Maximal Assistance - Patient 25 - 49%     Transfers Chair/bed transfer  Transfers assist     Chair/bed transfer assist level: 2 Helpers     Locomotion Ambulation   Ambulation assist              Walk 10 feet activity   Assist           Walk 50 feet activity   Assist           Walk 150 feet activity   Assist           Walk 10 feet on uneven surface  activity   Assist           Wheelchair     Assist  Wheelchair 50 feet with 2 turns activity    Assist            Wheelchair 150 feet activity     Assist            Medical Problem List and Plan: 1.  Debility secondary to  Parkinson's disease/multi-medical  Begin CIR 2.  DVT Prophylaxis/Anticoagulation: Subcutaneous Lovenox 3. Pain Management:  Tylenol as needed 4. Mood:  Klonopin 0.5 mg daily at bedtime, Aricept 10 mg daily at bedtime  Zoloft 12.5 mg daily d/ced on 12/4 per daughter (plan was to wean) 5. Neuropsych: This patient is not capable of making decisions on his own behalf. 6. Skin/Wound Care:  Routine skin checks 7. Fluids/Electrolytes/Nutrition:  Routine in and out's   BMP within acceptable range on 12/3 8. Parkinson's disease. Sinemet 2 tablets 4 times a day. Follow-up outpatient neurology Dr. Skipper Cliche 9.  History of aortic stenosis. Status post TAVR procedure. Continue aspirin 10. Constipation. Laxative assistance 11.Overactive bladder.  Myrbetriq 12. ABLA  Hemoglobin 12.0 on 12/3  Continue to monitor 13.  Thrombocytopenia  Platelets 101 on 12/3  Continue to monitor  LOS: 1 days A FACE TO FACE EVALUATION WAS PERFORMED   Lorie Phenix 04/16/2018, 10:14 AM

## 2018-04-17 ENCOUNTER — Ambulatory Visit: Payer: Medicare Other | Admitting: Neurology

## 2018-04-17 ENCOUNTER — Inpatient Hospital Stay (HOSPITAL_COMMUNITY): Payer: Medicare Other | Admitting: Occupational Therapy

## 2018-04-17 ENCOUNTER — Inpatient Hospital Stay (HOSPITAL_COMMUNITY): Payer: Medicare Other | Admitting: Physical Therapy

## 2018-04-17 ENCOUNTER — Inpatient Hospital Stay (HOSPITAL_COMMUNITY): Payer: Medicare Other

## 2018-04-17 DIAGNOSIS — R5381 Other malaise: Principal | ICD-10-CM

## 2018-04-17 DIAGNOSIS — G479 Sleep disorder, unspecified: Secondary | ICD-10-CM

## 2018-04-17 NOTE — Progress Notes (Signed)
[place] PHYSICAL MEDICINE & REHABILITATION PROGRESS NOTE  Subjective/Complaints: Patient seen sitting up in bed this morning.  Daughter awaiting outside stating that patient is paranoid this morning and to attempt to calm him.  Patient states he did not sleep well overnight.  He does state that he had a good first day of therapies yesterday.  He is confused.  ROS: Denies CP, SOB, N/V/D.  Objective: Vital Signs: Blood pressure 119/79, pulse 66, temperature 97.7 F (36.5 C), resp. rate 20, height 5\' 6"  (1.676 m), weight 58.1 kg, SpO2 98 %. No results found. Recent Labs    04/15/18 1616 04/16/18 0447  WBC 5.6 5.4  HGB 12.0* 12.0*  HCT 38.1* 38.5*  PLT 106* 101*   Recent Labs    04/15/18 1616 04/16/18 0447  NA  --  139  K  --  3.9  CL  --  104  CO2  --  28  GLUCOSE  --  106*  BUN  --  17  CREATININE 1.27* 1.13  CALCIUM  --  8.8*    Physical Exam: BP 119/79 (BP Location: Left Arm)   Pulse 66   Temp 97.7 F (36.5 C)   Resp 20   Ht 5\' 6"  (1.676 m)   Wt 58.1 kg   SpO2 98%   BMI 20.67 kg/m  Gen: NAD. Frail. HENT: Normocephalic and atraumatic.  Eyes: EOMI. No discharge.   Cardiovascular: RRR.  No JVD. Respiratory: Effort normal and breath sounds normal. No respiratory distress.  GI: Bowel sounds are normal. He exhibits no distension.  Musculoskeletal: No edema or tenderness in extremities  Neurological:  Alert Makes good eye contact with examiner.  Limited overall medical historian.  Follow simple commands. Motor: Grossly 4+/5 throughout, limited by bradykinesia and rigidity, slightly improved  Skin: Skin is warm and dry.  Psychiatric: His affect is blunt. His speech is delayed and slurred. He is slowed. Cognition and memory are impaired.   Assessment/Plan: 1. Functional deficits secondary to Parkinson's disease which require 3+ hours per day of interdisciplinary therapy in a comprehensive inpatient rehab setting.  Physiatrist is providing close team  supervision and 24 hour management of active medical problems listed below.  Physiatrist and rehab team continue to assess barriers to discharge/monitor patient progress toward functional and medical goals  Care Tool:  Bathing    Body parts bathed by patient: Right arm, Left arm, Chest, Abdomen, Buttocks, Right upper leg, Front perineal area, Face, Left lower leg, Right lower leg, Left upper leg         Bathing assist Assist Level: Minimal Assistance - Patient > 75%     Upper Body Dressing/Undressing Upper body dressing   What is the patient wearing?: Button up shirt    Upper body assist Assist Level: Minimal Assistance - Patient > 75%    Lower Body Dressing/Undressing Lower body dressing      What is the patient wearing?: Pants, Incontinence brief     Lower body assist Assist for lower body dressing: Moderate Assistance - Patient 50 - 74%     Toileting Toileting    Toileting assist Assist for toileting: Moderate Assistance - Patient 50 - 74%     Transfers Chair/bed transfer  Transfers assist     Chair/bed transfer assist level: Minimal Assistance - Patient > 75%(Hand held assist)     Locomotion Ambulation   Ambulation assist      Assist level: Minimal Assistance - Patient > 75% Assistive device: Hand held assist Max distance:  10'   Walk 10 feet activity   Assist     Assist level: Minimal Assistance - Patient > 75% Assistive device: Walker-rolling   Walk 50 feet activity   Assist    Assist level: Minimal Assistance - Patient > 75% Assistive device: Walker-rolling    Walk 150 feet activity   Assist Walk 150 feet activity did not occur: Safety/medical concerns         Walk 10 feet on uneven surface  activity   Assist Walk 10 feet on uneven surfaces activity did not occur: Safety/medical concerns         Wheelchair     Assist   Type of Wheelchair: Manual    Wheelchair assist level: Supervision/Verbal cueing, 2  helpers Max wheelchair distance: 100'    Wheelchair 50 feet with 2 turns activity    Assist        Assist Level: Supervision/Verbal cueing   Wheelchair 150 feet activity     Assist Wheelchair 150 feet activity did not occur: Safety/medical concerns          Medical Problem List and Plan: 1.  Debility secondary to  Parkinson's disease/multi-medical  Continue CIR  Team conference today to discuss current and goals and coordination of care, home and environmental barriers, and discharge planning with nursing, case manager, and therapies.  2.  DVT Prophylaxis/Anticoagulation: Subcutaneous Lovenox 3. Pain Management:  Tylenol as needed 4. Mood:  Klonopin 0.5 mg daily at bedtime, Aricept 10 mg daily at bedtime  Zoloft 12.5 mg daily d/ced on 12/4 per daughter (plan was to wean) 5. Neuropsych: This patient is not capable of making decisions on his own behalf. 6. Skin/Wound Care:  Routine skin checks 7. Fluids/Electrolytes/Nutrition:  Routine in and out's   BMP within acceptable range on 12/3 8. Parkinson's disease. Sinemet 2 tablets 4 times a day. Follow-up outpatient neurology Dr. Skipper Cliche 9.  History of aortic stenosis. Status post TAVR procedure. Continue aspirin 10. Constipation. Laxative assistance 11.Overactive bladder. Myrbetriq 12. ABLA  Hemoglobin 12.0 on 12/3  Continue to monitor 13.  Thrombocytopenia  Platelets 101 on 12/3  Continue to monitor 14.?  Sleep disturbance  Sleep chart ordered  LOS: 2 days A FACE TO FACE EVALUATION WAS PERFORMED  Dessie Delcarlo Lorie Phenix 04/17/2018, 10:05 AM

## 2018-04-17 NOTE — Plan of Care (Signed)
  Problem: RH BOWEL ELIMINATION Goal: RH STG MANAGE BOWEL WITH ASSISTANCE Description STG Manage Bowel with Assistance. mod  Outcome: Progressing Goal: RH STG MANAGE BOWEL W/MEDICATION W/ASSISTANCE Description STG Manage Bowel with Medication with Assistance. mod  Outcome: Progressing   Problem: RH BLADDER ELIMINATION Goal: RH STG MANAGE BLADDER WITH ASSISTANCE Description STG Manage Bladder With Assistance. Mod  Outcome: Progressing   Problem: RH SKIN INTEGRITY Goal: RH STG SKIN FREE OF INFECTION/BREAKDOWN Description Skin to remain free of breakdown and infection on rehab with mod assist  Outcome: Progressing   Problem: RH SAFETY Goal: RH STG ADHERE TO SAFETY PRECAUTIONS W/ASSISTANCE/DEVICE Description STG Adhere to Safety Precautions With Assistance/Device. Mod assist  Outcome: Progressing Goal: RH STG DECREASED RISK OF FALL WITH ASSISTANCE Description STG Decreased Risk of Fall With Assistance. Mod assist  Outcome: Progressing

## 2018-04-17 NOTE — Progress Notes (Signed)
Speech Language Pathology Daily Session Note  Patient Details  Name: [name]. MRN: [id number] Date of Birth: Oct 06, 1940  Today's Date: 04/17/2018 SLP Individual Time: 0900-1000 SLP Individual Time Calculation (min): 60 min  Short Term Goals: Week 1: SLP Short Term Goal 1 (Week 1): Pt will demonstrate sustained attention to function task in 2 minute intervals with mod A verbal cues. SLP Short Term Goal 2 (Week 1): Pt will express wants/needs in response to verbal questions with min A verbal cues. SLP Short Term Goal 3 (Week 1): Pt will utilize speech intelligibility strategies at phrase level with max A verbal cues to achieve 50% intelligibility. SLP Short Term Goal 4 (Week 1): Pt will demonstrate basic problem solving in functional task with Mod A verbal cues. SLP Short Term Goal 5 (Week 1): Pt will idenitify 2 cognitive and 2 physical deficits due to acute changes with Mod A verbal cues.  Skilled Therapeutic Interventions: Skilled ST services focused on cognitive skills. SLP communicated with pt's daughter on the way to pt's room, she stated he had a better night sleep. SLP facilitated sustained attention and use of speech intelligibility skills, following education to open mouth and project voice, utilizing structured simple picture descriptions, pt initially required mod A verbal cues for sustained attention, however following 10 minutes required max A verbal cues, in 2 minute intervals for the rest of the session and demonstrated 70% intelligibility at phrase level in structured task, picture description, during the initial 10 minutes. Pt's speech intelligibility decreased to 50% at phrase level with max A verbal cues, including some language of confusion and largely tangential speech as sustained attention decreased and pt demonstrated greater difficulty keeping eyes open. SLP facilitated basic problem solving skills while brushing teeth, pt required mod A verbal cues. Pt required  min A verbal cues to express wants and needs with verbal prompts, goal has been updated to reflect. SLP facilitated intellectual awareness given question prompts, pt required max A verbal cues, SLP added awareness goal based on current deficit. Pt was left in room with call bell within reach and bed alarm set. Recommend to continue skilled ST services.      Pain Pain Assessment Pain Score: 0-No pain  Therapy/Group: Individual Therapy  Aiysha Jillson  [facility] 04/17/2018, 3:17 PM

## 2018-04-17 NOTE — Plan of Care (Signed)
  Problem: RH BLADDER ELIMINATION Goal: RH STG MANAGE BLADDER WITH ASSISTANCE Description STG Manage Bladder With Assistance. Mod  Outcome: Progressing  Pt held urinal as he stood on side of bed. Contact guard assist

## 2018-04-17 NOTE — Progress Notes (Signed)
Occupational Therapy Session Note  Patient Details  Name: [name]. MRN: [id number] Date of Birth: 08-Dec-1940  Today's Date: 04/17/2018 OT Individual Time: 2683-4196 OT Individual Time Calculation (min): 29 min    Short Term Goals: Week 1:  OT Short Term Goal 1 (Week 1): Pt will perform LB bathing sit to stand with supervision for 2 consecutive sessions.  OT Short Term Goal 2 (Week 1): Pt will perform LB dressing sit to stand with supervision and 2 consecutive sessions.  OT Short Term Goal 3 (Week 1): Pt will complete all aspects of toileting with supervision using LRAD and elevated toilet.  OT Short Term Goal 4 (Week 1): Pt will demonstrate emergent awareness of balance issues and sit down to thread LB clothing with no more than one questioning cue.   Skilled Therapeutic Interventions/Progress Updates:    Began session with transfer from supine to sit EOB with min guard assist and pt requesting to go to the bathroom.  Once on the EOB, therapist gave him his shoes to donn which he completed with min assist and mod instructional cueing to not try to stand and place his feet in them, but to sit and donn them.  Once shoes were on he stood with mod assist and ambulated to the bathroom.  Moderate increased posterior lean noted during transfer.  Instructed pt that it would likely be better to sit down based on current balance to use the bathroom however he continued to stand, requiring mod assist to keep from falling backward.  Increased time needed for him to manage unzipping and zipping up his pants.  Next he ambulated out to the sink for washing his hands and then therapist had him sit in the wheelchair for safety to work on shaving with his Copy.  Setup to complete shaving task.  Pt left in the wheelchair with safety alarm belt in place and call button in reach to conclude session.    Therapy Documentation Precautions:  Precautions Precautions: Fall Restrictions Weight  Bearing Restrictions: No  Pain: Pain Assessment Pain Scale: Faces Pain Score: 0-No pain   Therapy/Group: Individual Therapy  Jandi Swiger,Julio OTR/L 04/17/2018, 3:36 PM

## 2018-04-17 NOTE — Progress Notes (Signed)
Pt slept well. He did not try to get up OOB. Pt stands beside the bed and holds urinal with right hand. Pt does exceptional well. He likes to sleep on his right side. Daughter requested meds to be given early, after educating her she agreed meds be given at scheduled time. Assured her she is welcomed to call to check on at any time.

## 2018-04-17 NOTE — Progress Notes (Signed)
Physical Therapy Session Note  Patient Details  Name: [name]. MRN: [id number] Date of Birth: [birth year]  Today's Date: 04/17/2018 PT Individual Time: 1460-4799 PT Individual Time Calculation (min): 60 min   Short Term Goals: Week 1:  PT Short Term Goal 1 (Week 1): Pt will demonstrate appropriate sequencing and initiation of functional mobility 50% of the time PT Short Term Goal 2 (Week 1): Pt will ambulate 68' w/ CGA w/ LRAD PT Short Term Goal 3 (Week 1): Pt will maintain dynamic standing balance w/ min assist PT Short Term Goal 4 (Week 1): Pt will participate in 30 min of upright activity w/o increase in fatigue.   Skilled Therapeutic Interventions/Progress Updates:    Pt received seated in WC and agreeable to PT.  Gait training throughout session x25 ft, x78f, x338fwith RW. Min assist with gait; verbal cues for increasing step length and AD management. Min assist to prevent posterior LOB during sit to stand; verbal cues for maintaining anterior weight shift.   Nustep, 3 min bouts x3. Verbal cues for hand placement, maintaining eccentric control, and speed of exercise.   Dynavision; 1 min 4 rings. 2 sets.  Min assist from SPT. Tactile and verbal cuing to prevent posterior LOB during activity. 1st round with L UE, 2nd round with RUE. Improved performance during second round.   Anterior weight shifting on mat table. Pt reaching down to touch toes x5 to promote anterior weight shift for improved sit to stand. Progressed to pt reaching to touch walker x5 then pt reaching anteriorly for walker and standing up x5. Mod verbal cuing for sequencing of each exercise. CGA-min assist for pt safety.   Ended session with pt seated in WC, call bell within reach, and all needs met.    Therapy Documentation Precautions:  Precautions Precautions: Fall Restrictions Weight Bearing Restrictions: No Vital Signs: Therapy Vitals Temp: 98.4 F (36.9 C) Temp Source: Oral Pulse Rate:  68 Resp: 14 BP: 139/76 Patient Position (if appropriate): Lying Oxygen Therapy SpO2: 100 % O2 Device: Room Air Pain: Pain Assessment Pain Scale: Faces Pain Score: 0-No pain   Therapy/Group: Individual Therapy  AmAmador Cunas2/08/2017, 5:13 PM

## 2018-04-17 NOTE — Progress Notes (Signed)
Occupational Therapy Session Note  Patient Details  Name: [name]. MRN: [id number] Date of Birth: 1940-06-04  Today's Date: 04/17/2018 OT Individual Time: 1004-1105 OT Individual Time Calculation (min): 61 min    Short Term Goals: Week 1:  OT Short Term Goal 1 (Week 1): Pt will perform LB bathing sit to stand with supervision for 2 consecutive sessions.  OT Short Term Goal 2 (Week 1): Pt will perform LB dressing sit to stand with supervision and 2 consecutive sessions.  OT Short Term Goal 3 (Week 1): Pt will complete all aspects of toileting with supervision using LRAD and elevated toilet.  OT Short Term Goal 4 (Week 1): Pt will demonstrate emergent awareness of balance issues and sit down to thread LB clothing with no more than one questioning cue.   Skilled Therapeutic Interventions/Progress Updates:    Pt completed transfer from supine to sit EOB with increased time.  He was able to state place this am "[place]".  He agreed to work on washing up a little at the sink from the wheelchair and getting dressing.  Increased posterior lean with standing, requiring initial max assist to maintain his balance.  He performed stand pivot transfer over to the wheelchair, with therapist moving him to the sink for bathing.  Mod instructional cueing for sequencing bathing throughout session as well as for not attempting to stand up to complete most tasks.  Multiple times pt would attempt to stand up such as when combing his hair, when attempting to donn button up shirt, or when attempting to donn pants.  Increased difficulty placing RLE in brief and pants this session compared to yesterday.  Pt reports using a foot stool at home, so will attempt to get one for tomorrows session.  [name] needed mod assist for sit to stand at the sink when tucking in his shirt and pulling up and fastening his pants.  Max assist for fastening the buttons on his shirt as well.  Finished session with therapist  assisting to donn his socks with max assist and then pt sliding into his shoes without unfastening them with min assist.  Pt left in the wheelchair at end of session with call button and phone in reach and alarm belt in place.    Therapy Documentation Precautions:  Precautions Precautions: Fall Restrictions Weight Bearing Restrictions: No Pain: Pain Assessment Pain Scale: Faces Pain Score: 0-No pain ADL: See Care Tool Section of chart for some Details of ADL  Therapy/Group: Individual Therapy  Jule Schlabach,Lonny OTR/L 04/17/2018, 12:39 PM

## 2018-04-18 ENCOUNTER — Inpatient Hospital Stay (HOSPITAL_COMMUNITY): Payer: Medicare Other | Admitting: Occupational Therapy

## 2018-04-18 ENCOUNTER — Inpatient Hospital Stay (HOSPITAL_COMMUNITY): Payer: Medicare Other | Admitting: Speech Pathology

## 2018-04-18 ENCOUNTER — Inpatient Hospital Stay (HOSPITAL_COMMUNITY): Payer: Medicare Other | Admitting: Physical Therapy

## 2018-04-18 NOTE — Progress Notes (Signed)
Social Work Scientific laboratory technician  Patient Details  Name: [name]. MRN: [id number] Date of Birth: 05/21/1940  Today's Date: 04/17/2018  Problem List:  Patient Active Problem List   Diagnosis Date Noted  . Sleep disturbance   . Acute blood loss anemia   . Bradykinesia   . Debility 04/15/2018  . Overactive bladder   . Slow transit constipation   . Diastolic dysfunction   . Thrombocytopenia ([place])   . Hypertension   . Generalized weakness   . TIA (transient ischemic attack) 04/08/2018  . 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 S/P TAVR (transcatheter aortic valve replacement) 11/18/2013  . Aortic valve disorder 11/18/2013  . Parkinson's disease ([place])   . Severe aortic stenosis 09/26/2013  . Idiopathic Parkinson's disease ([place]) 10/23/2011   Past Medical History:  Past Medical History:  Diagnosis Date  . Anxiety   . Aortic stenosis   . Arthritis    left hip  . Cancer ([place])    basal cell- facial?  . Depression   . Diabetes mellitus without complication ([place])   . GERD (gastroesophageal reflux disease)   . Headache(784.0)   . Heart murmur   . Hernia, inguinal, bilateral, recurrent   . Hypertension   . Kidney stones   . Parkinson disease ([place])   . Parkinson's disease (tremor, stiffness, slow motion, unstable posture) ([place]) 12/11/2006  . PONV (postoperative nausea and vomiting)   . Restless leg syndrome   . S/P TAVR (transcatheter aortic valve replacement) 11/18/2013   26 mm Edwards Sapien XT transcatheter heart valve placed via open right transfemoral approach  . TIA (transient ischemic attack) 02/08/2007   Past Surgical History:  Past Surgical History:  Procedure Laterality Date  . CARDIAC CATHETERIZATION  10/09/13  . cataract  2008   bilateral  . COLONOSCOPY    . CYSTOSCOPY/RETROGRADE/URETEROSCOPY  05/29/2011   Procedure: CYSTOSCOPY/RETROGRADE/URETEROSCOPY;  Surgeon: Malka So, MD;  Location: [facility];   Service: Urology;  Laterality: Left;  no stent   . EYE SURGERY Bilateral    /w IOL  . INGUINAL HERNIA REPAIR Bilateral 03/30/2014   Procedure: OPEN BILATERAL INGUINAL HERNIA REPAIR WITH MESH;  Surgeon: Coralie Keens, MD;  Location: [facility];  Service: General;  Laterality: Bilateral;  . INSERTION OF MESH Bilateral 03/30/2014   Procedure: INSERTION OF MESH;  Surgeon: Coralie Keens, MD;  Location: [facility];  Service: General;  Laterality: Bilateral;  . INTRAOPERATIVE TRANSESOPHAGEAL ECHOCARDIOGRAM N/A 11/18/2013   Procedure: INTRAOPERATIVE TRANSESOPHAGEAL ECHOCARDIOGRAM;  Surgeon: Sherren Mocha, MD;  Location: [facility];  Service: Open Heart Surgery;  Laterality: N/A;  . LEFT AND RIGHT HEART CATHETERIZATION WITH CORONARY ANGIOGRAM N/A 10/09/2013   Procedure: LEFT AND RIGHT HEART CATHETERIZATION WITH CORONARY ANGIOGRAM;  Surgeon: Blane Ohara, MD;  Location: [facility] CATH LAB;  Service: Cardiovascular;  Laterality: N/A;  . TRANSCATHETER AORTIC VALVE REPLACEMENT, TRANSFEMORAL N/A 11/18/2013   Procedure: TRANSCATHETER AORTIC VALVE REPLACEMENT, TRANSFEMORAL;  Surgeon: Sherren Mocha, MD;  Location: [place];  Service: Open Heart Surgery;  Laterality: N/A;   Social History:  reports that he has never smoked. He has never used smokeless tobacco. He reports that he does not drink alcohol or use drugs.  Family / Support Systems Marital Status: Widow/Widower How Long?: 2014 Patient Roles: Parent Children: daughter, Limuel Nieblas [facility]) @ (C) [phone];  son, Kasandra Knudsen (living ~ 5 mins from pt) and son, Liliane Channel. Other Supports: Pt's sister, Wilburn Cornelia, living next door to pt Anticipated Caregiver: daughter and son,  sister and may hire assist if recommended Ability/Limitations of Caregiver: son and daughter work Building control surveyor Availability: Other (Comment)((iniital 24/7 supervision can be arranged if needed; daughter)) Family Dynamics: Daughter very involved with assessment interview process as she also was providing  meal assistance.  She is very concerned about pt's current presentation.  States that all children and pt's sister will provide assistance upon d/c.  Social History Preferred language: English Religion: [employer] Cultural Background: NA Read: Yes Write: Yes Employment Status: Retired Freight forwarder Issues: None Guardian/Conservator: None - per MD, pt is not capable of making decisions on his own behalf - defer to children.   Abuse/Neglect Abuse/Neglect Assessment Can Be Completed: Yes Physical Abuse: Denies Verbal Abuse: Denies Sexual Abuse: Denies Exploitation of patient/patient's resources: Denies Self-Neglect: Denies  Emotional Status Pt's affect, behavior adn adjustment status: Pt sitting up in w/c but with significant confusion and unable to participate in interview process.  Falling asleep and jerking in w/c which awakens him with a startle.  Cannot assess mood at this point.  Will monitor as cognition improves and refer to neuropsychology when appropriate. Recent Psychosocial Issues: Daughter notes pt was widowed in 08/16/12 and eldest daughter died in 2014-08-17 Pyschiatric History: None Substance Abuse History: None  Patient / Family Perceptions, Expectations & Goals Pt/Family understanding of illness & functional limitations: As noted, pt with siginificant confusion, cognitive impairments. Daughter with basic understanding of his medical issues and current state of debility.  Daughter believes that pt's consitipation and poor sleep are causing his current confusion and reports "this always happens in the hospital." Premorbid pt/family roles/activities: Pt was living alone with sister next door.  Sister and children providing intermittent support and assistance with transportation, finances and med management.  Report he was independent with ADLs and mobility. Anticipated changes in roles/activities/participation: Per goals of supervision (at best), family will  need to indentify primary caregiver and arrange 24/7 support in the home. Pt/family expectations/goals: Daughter's primary focus is getting pt to normal bowel function, better sleep and feels we will "see a completely differenct person."  Pt unable to articulate a rehab goal.  [employer]: None Premorbid Home Care/DME Agencies: None Transportation available at discharge: yes Resource referrals recommended: Neuropsychology, Support group (specify)  Discharge Planning Living Arrangements: Alone Support Systems: Children, Friends/neighbors Type of Residence: Private residence Insurance underwriter Resources: [employer], Multimedia programmer (specify)(BCBS) Financial Resources: Radio broadcast assistant Screen Referred: No Living Expenses: Own Money Management: Family Does the patient have any problems obtaining your medications?: No Home Management: Pt with family assist. Patient/Family Preliminary Plans: Daughter hopeful pt can d/c to his home and that they will initially provide 24/7 supervision with hopes he can return to only intermittent support needed. Social Work Anticipated Follow Up Needs: HH/OP Expected length of stay: 12-14 days  Clinical Impression Very cognitively impaired gentleman here on CIR for debility and Parkinson's dz.  Daughter feels that "he gets like this every time he comes to the hospital.  If he can go to the bathroom and get some sleep you will see another person."  Daughter completes interview process.  States family can coordinate short term 24/7 support.  Will follow for d/c planning needs and monitor pt's progress to see if daughter's speculations are correct.  Moises Terpstra 04/17/2018, 2:47 PM

## 2018-04-18 NOTE — Progress Notes (Signed)
Occupational Therapy Session Note  Patient Details  Name: [name]. MRN: [id number] Date of Birth: Jul 12, 1940  Today's Date: 04/18/2018 OT Individual Time: 0901-1002 OT Individual Time Calculation (min): 61 min    Short Term Goals: Week 1:  OT Short Term Goal 1 (Week 1): Pt will perform LB bathing sit to stand with supervision for 2 consecutive sessions.  OT Short Term Goal 2 (Week 1): Pt will perform LB dressing sit to stand with supervision and 2 consecutive sessions.  OT Short Term Goal 3 (Week 1): Pt will complete all aspects of toileting with supervision using LRAD and elevated toilet.  OT Short Term Goal 4 (Week 1): Pt will demonstrate emergent awareness of balance issues and sit down to thread LB clothing with no more than one questioning cue.   Skilled Therapeutic Interventions/Progress Updates:    Pt had already dressed with SLP prior to this OT session secondary to not having on any clothes when she came to see him.  Did have pt work on donning socks and shoes from wheelchair level however.  Therapist provided stool for him to prop his feet on when donning his socks.  He was able to complete donning shoes without use of the stool.  Supervision to complete both tasks.  He was able to complete combing his hair next while standing at the sink.  Mod assist for initial standing balance, progressing to min assist for sustained balance while performing grooming task.  Next had pt transfer back to the wheelchair.  He was able to then propel himself down to the therapy gym with supervision and increased time.  Had him transfer to the therapy mat with min assist stand pivot once in the gym.  Attempted to have pt work on standing balance while incorporating peg design task.  He was able to stand with min assist but could not replicate the easiest peg design when given increased time and max instructional cueing.  Instead just had pt focus on picking up pegs from the rolling stool beside of  him while standing, and placing them into the peg board randomly.  Had pt alternate hands as well.  Increased posterior bias on his heels in standing resulting in initial LOB posteriorly with all standing.  Finished session with ambulation back to the room with min hand held assist and pt left sitting up in the wheelchair with safety belt in place and call button in reach.  Nursing student also present to apply dressing to the right hand which currently had a band aid that was soaked with blood.    Therapy Documentation Precautions:  Precautions Precautions: Fall Restrictions Weight Bearing Restrictions: No   Pain: Pain Assessment Pain Scale: 0-10 Pain Score: 0-No pain ADL: See Care Tool Section for some details of ADL function  Therapy/Group: Individual Therapy  Lavetta Geier,Antwane OTR/L 04/18/2018, 12:10 PM

## 2018-04-18 NOTE — Progress Notes (Signed)
Physical Therapy Session Note  Patient Details  Name: [name]. MRN: [id number] Date of Birth: September 02, 1940  Today's Date: 04/18/2018 PT Individual Time: 1400-1515 PT Individual Time Calculation (min): 75 min   Short Term Goals: Week 1:  PT Short Term Goal 1 (Week 1): Pt will demonstrate appropriate sequencing and initiation of functional mobility 50% of the time PT Short Term Goal 2 (Week 1): Pt will ambulate 29' w/ CGA w/ LRAD PT Short Term Goal 3 (Week 1): Pt will maintain dynamic standing balance w/ min assist PT Short Term Goal 4 (Week 1): Pt will participate in 30 min of upright activity w/o increase in fatigue.   Skilled Therapeutic Interventions/Progress Updates:    Pt received seated in WC and agreeable to PT. Self propelled WC ~120 ft x2 with CGA for UE sequencing for turns.   Stand pivot transfer to mat with RW, min assist. Verbal and tactile cues for increasing LE step length, LE placement, and safe management of AD.   Anterior weight shifting on mat table. Pt reaching down to touch toes x5, progressed to touching walker x5, then reaching anteriorly for RW and standing x5. Progression completed 2x during session. Mod verbal cuing for sequencing of each exercise. CGA-min assist for pt safety.  Standing balance on red wedge. Pt completed cup stacking utilizing R UE then L UE with RW x2 each side. Pt also completed color sorting activity while standing. Pt mod assist initially progressing to min assist; required mod cuing and initial assistance to prevent posterior LOB. Pt able to sustain standing position for up to 5 minutes.   Gait training x39f with RW, min assist. Adduction of LLE during turns, mild LOB with correction from SPT. Mod verbal cues for management of AD during gait and increased step length.   Ended session with pt seated in WC, call bell within reach, and all needs met.  Therapy Documentation Precautions:  Precautions Precautions:  Fall Restrictions Weight Bearing Restrictions: No   Pain:  0/10   Therapy/Group: Individual Therapy  AAmador Cunas12/09/2017, 3:38 PM

## 2018-04-18 NOTE — Progress Notes (Signed)
[facility] PHYSICAL MEDICINE & REHABILITATION PROGRESS NOTE  Subjective/Complaints: Patient seen sitting up in bed eating breakfast this morning.  He states he slept well overnight.  No family at bedside.  ROS: Denies CP, SOB, N/V/D.  Objective: Vital Signs: Blood pressure 119/61, pulse 67, temperature 98.6 F (37 C), temperature source Oral, resp. rate 17, height 5\' 6"  (1.676 m), weight 58.1 kg, SpO2 99 %. No results found. Recent Labs    04/15/18 1616 04/16/18 0447  WBC 5.6 5.4  HGB 12.0* 12.0*  HCT 38.1* 38.5*  PLT 106* 101*   Recent Labs    04/15/18 1616 04/16/18 0447  NA  --  139  K  --  3.9  CL  --  104  CO2  --  28  GLUCOSE  --  106*  BUN  --  17  CREATININE 1.27* 1.13  CALCIUM  --  8.8*    Physical Exam: BP 119/61 (BP Location: Left Arm)   Pulse 67   Temp 98.6 F (37 C) (Oral)   Resp 17   Ht 5\' 6"  (1.676 m)   Wt 58.1 kg   SpO2 99%   BMI 20.67 kg/m  Gen: NAD. Frail. HENT: Normocephalic and atraumatic.  Eyes: EOMI. No discharge.   Cardiovascular: RRR.  No JVD. Respiratory: Effort normal and breath sounds normal.  GI: Bowel sounds are normal. He exhibits no distension.  Musculoskeletal: No edema or tenderness in extremities  Neurological:  Alert and oriented x1 Makes good eye contact with examiner.  Follow simple commands. Motor: Grossly 4+/5 throughout, limited by bradykinesia and rigidity, slightly improved  Skin: Skin is warm and dry.  Psychiatric: His affect is blunt. His speech is delayed and slurred. He is slowed. Cognition and memory are impaired.   Assessment/Plan: 1. Functional deficits secondary to Parkinson's disease which require 3+ hours per day of interdisciplinary therapy in a comprehensive inpatient rehab setting.  Physiatrist is providing close team supervision and 24 hour management of active medical problems listed below.  Physiatrist and rehab team continue to assess barriers to discharge/monitor patient progress toward  functional and medical goals  Care Tool:  Bathing    Body parts bathed by patient: Right arm, Left arm, Chest, Abdomen, Front perineal area, Buttocks, Right upper leg, Left upper leg, Right lower leg, Left lower leg, Face         Bathing assist Assist Level: Moderate Assistance - Patient 50 - 74%     Upper Body Dressing/Undressing Upper body dressing   What is the patient wearing?: Button up shirt    Upper body assist Assist Level: Minimal Assistance - Patient > 75%    Lower Body Dressing/Undressing Lower body dressing      What is the patient wearing?: Pants     Lower body assist Assist for lower body dressing: Minimal Assistance - Patient > 75%     Toileting Toileting    Toileting assist Assist for toileting: Moderate Assistance - Patient 50 - 74%     Transfers Chair/bed transfer  Transfers assist     Chair/bed transfer assist level: Minimal Assistance - Patient > 75%     Locomotion Ambulation   Ambulation assist      Assist level: Minimal Assistance - Patient > 75% Assistive device: Walker-rolling Max distance: 60   Walk 10 feet activity   Assist     Assist level: Minimal Assistance - Patient > 75% Assistive device: Walker-rolling   Walk 50 feet activity   Assist  Assist level: Minimal Assistance - Patient > 75% Assistive device: Walker-rolling    Walk 150 feet activity   Assist Walk 150 feet activity did not occur: Safety/medical concerns         Walk 10 feet on uneven surface  activity   Assist Walk 10 feet on uneven surfaces activity did not occur: Safety/medical concerns         Wheelchair     Assist   Type of Wheelchair: Manual    Wheelchair assist level: Supervision/Verbal cueing, 2 helpers Max wheelchair distance: 100'    Wheelchair 50 feet with 2 turns activity    Assist        Assist Level: Supervision/Verbal cueing   Wheelchair 150 feet activity     Assist Wheelchair 150 feet  activity did not occur: Safety/medical concerns          Medical Problem List and Plan: 1.  Debility secondary to  Parkinson's disease/multi-medical  Continue CIR 2.  DVT Prophylaxis/Anticoagulation: Subcutaneous Lovenox 3. Pain Management:  Tylenol as needed 4. Mood:  Klonopin 0.5 mg daily at bedtime, Aricept 10 mg daily at bedtime  Zoloft 12.5 mg daily d/ced on 12/4 per daughter (plan was to wean) 5. Neuropsych: This patient is not capable of making decisions on his own behalf. 6. Skin/Wound Care:  Routine skin checks 7. Fluids/Electrolytes/Nutrition:  Routine in and out's   BMP within acceptable range on 12/3  Labs ordered for tomorrow 8. Parkinson's disease. Sinemet 2 tablets 4 times a day. Follow-up outpatient neurology Dr. Skipper Cliche 9.  History of aortic stenosis. Status post TAVR procedure. Continue aspirin 10. Constipation. Laxative assistance 11.Overactive bladder. Myrbetriq 12. ABLA  Hemoglobin 12.0 on 12/3  Continue to monitor 13.  Thrombocytopenia  Platelets 101 on 12/3  Labs ordered for tomorrow  Continue to monitor 14.?  Sleep disturbance  Sleep chart pending  LOS: 3 days A FACE TO FACE EVALUATION WAS PERFORMED  Ankit Lorie Phenix 04/18/2018, 8:58 AM

## 2018-04-18 NOTE — Care Management (Signed)
[facility] Individual Statement of Services  Patient Name:  Keaton Beichner.  Date:  04/18/2018  Welcome to the [facility].  Our goal is to provide you with an individualized program based on your diagnosis and situation, designed to meet your specific needs.  With this comprehensive rehabilitation program, you will be expected to participate in at least 3 hours of rehabilitation therapies Monday-Friday, with modified therapy programming on the weekends.  Your rehabilitation program will include the following services:  Physical Therapy (PT), Occupational Therapy (OT), Speech Therapy (ST), 24 hour per day rehabilitation nursing, Neuropsychology, Case Management (Social Worker), Rehabilitation Medicine, Nutrition Services and Pharmacy Services  Weekly team conferences will be held on Wednesdays to discuss your progress.  Your Social Worker will talk with you frequently to get your input and to update you on team discussions.  Team conferences with you and your family in attendance may also be held.  Expected length of stay: 12-14 days   Overall anticipated outcome: supervision  Depending on your progress and recovery, your program may change. Your Social Worker will coordinate services and will keep you informed of any changes. Your Social Worker's name and contact numbers are listed  below.  The following services may also be recommended but are not provided by the [facility] will be made to provide these services after discharge if needed.  Arrangements include referral to agencies that provide these services.  Your insurance has been verified to be:  Medicare; [place] Your primary doctor is:  Development worker, community  Pertinent information will be shared with your doctor and your insurance company.  Social Worker:  [place], [place] or ([phone]   Information discussed with and copy given to patient by: Lennart Pall, 04/18/2018, 2:50 PM

## 2018-04-18 NOTE — Progress Notes (Signed)
Speech Language Pathology Daily Session Note  Patient Details  Name: [name]. MRN: [id number] Date of Birth: 1940-09-07  Today's Date: 04/18/2018 SLP Individual Time: 0805-0900 SLP Individual Time Calculation (min): 55 min  Short Term Goals: Week 1: SLP Short Term Goal 1 (Week 1): Pt will demonstrate sustained attention to function task in 2 minute intervals with mod A verbal cues. SLP Short Term Goal 2 (Week 1): Pt will express wants/needs in response to verbal questions with min A verbal cues. SLP Short Term Goal 3 (Week 1): Pt will utilize speech intelligibility strategies at phrase level with max A verbal cues to achieve 50% intelligibility. SLP Short Term Goal 4 (Week 1): Pt will demonstrate basic problem solving in functional task with Mod A verbal cues. SLP Short Term Goal 5 (Week 1): Pt will idenitify 2 cognitive and 2 physical deficits due to acute changes with Mod A verbal cues.  Skilled Therapeutic Interventions:  Pt was seen for skilled ST targeting cognitive goals.  Pt was in bed upon arrival eating breakfast.  Pt wasn't wearing any clothes and after finishing breakfast he requested to get dressed.  Pt needed intermittent assistance for buttoning the buttons on his shirt and threading his belt through the back 3 belt loops but was otherwise supervision for dressing.  He needed min-mod cues for sequencing and safety awareness during set up at the sink for brushing his teeth as he wanted to push his wheelchair up to the sink while walking and had several instances of near loss of balance.  After ADLs were completed, SLP facilitated the session with a novel card game for the remainder of today's therapy session.  Pt needed mod cues for working memory of task rules and procedures initially, but therapist was able to fade cues to min assist as task progressed.  Pt was left in wheelchair with call bell within reach, handed off to OT.  Continue per current plan of care.     Pain Pain Assessment Pain Scale: 0-10 Pain Score: 0-No pain  Therapy/Group: Individual Therapy  Mikael Skoda, Selinda Orion 04/18/2018, 10:43 AM

## 2018-04-18 NOTE — Plan of Care (Signed)
  Problem: RH SAFETY Goal: RH STG ADHERE TO SAFETY PRECAUTIONS W/ASSISTANCE/DEVICE Description STG Adhere to Safety Precautions With Assistance/Device. Mod assist  Outcome: Progressing  Bell alarm, call light within reach, three side rails. HOB with consumption of liquid/food.

## 2018-04-19 ENCOUNTER — Inpatient Hospital Stay (HOSPITAL_COMMUNITY): Payer: Medicare Other | Admitting: Physical Therapy

## 2018-04-19 ENCOUNTER — Inpatient Hospital Stay (HOSPITAL_COMMUNITY): Payer: Medicare Other | Admitting: Occupational Therapy

## 2018-04-19 ENCOUNTER — Inpatient Hospital Stay (HOSPITAL_COMMUNITY): Payer: Medicare Other | Admitting: Speech Pathology

## 2018-04-19 LAB — CBC WITH DIFFERENTIAL/PLATELET
Abs Immature Granulocytes: 0.04 10*3/uL (ref 0.00–0.07)
Basophils Absolute: 0 10*3/uL (ref 0.0–0.1)
Basophils Relative: 1 %
Eosinophils Absolute: 0.2 10*3/uL (ref 0.0–0.5)
Eosinophils Relative: 3 %
HCT: 37.1 % — ABNORMAL LOW (ref 39.0–52.0)
Hemoglobin: 11.9 g/dL — ABNORMAL LOW (ref 13.0–17.0)
Immature Granulocytes: 1 %
Lymphocytes Relative: 19 %
Lymphs Abs: 1.2 10*3/uL (ref 0.7–4.0)
MCH: 28.6 pg (ref 26.0–34.0)
MCHC: 32.1 g/dL (ref 30.0–36.0)
MCV: 89.2 fL (ref 80.0–100.0)
MONO ABS: 0.6 10*3/uL (ref 0.1–1.0)
MONOS PCT: 9 %
Neutro Abs: 4.2 10*3/uL (ref 1.7–7.7)
Neutrophils Relative %: 67 %
Platelets: 112 10*3/uL — ABNORMAL LOW (ref 150–400)
RBC: 4.16 MIL/uL — ABNORMAL LOW (ref 4.22–5.81)
RDW: 12.9 % (ref 11.5–15.5)
WBC: 6.3 10*3/uL (ref 4.0–10.5)
nRBC: 0 % (ref 0.0–0.2)

## 2018-04-19 LAB — BASIC METABOLIC PANEL
Anion gap: 6 (ref 5–15)
BUN: 20 mg/dL (ref 8–23)
CO2: 27 mmol/L (ref 22–32)
Calcium: 8.7 mg/dL — ABNORMAL LOW (ref 8.9–10.3)
Chloride: 107 mmol/L (ref 98–111)
Creatinine, Ser: 1.23 mg/dL (ref 0.61–1.24)
GFR calc Af Amer: >60 mL/min (ref >60)
GFR calc non Af Amer: 56 mL/min — ABNORMAL LOW (ref >60)
GLUCOSE: 112 mg/dL — AB (ref 70–99)
Potassium: 4.1 mmol/L (ref 3.5–5.1)
Sodium: 140 mmol/L (ref 135–145)

## 2018-04-19 NOTE — Plan of Care (Signed)
  Problem: RH SKIN INTEGRITY Goal: RH STG SKIN FREE OF INFECTION/BREAKDOWN Description Skin to remain free of breakdown and infection on rehab with mod assist  Outcome: Progressing   Problem: RH SAFETY Goal: RH STG ADHERE TO SAFETY PRECAUTIONS W/ASSISTANCE/DEVICE Description STG Adhere to Safety Precautions With Assistance/Device. Mod assist  Outcome: Progressing Goal: RH STG DECREASED RISK OF FALL WITH ASSISTANCE Description STG Decreased Risk of Fall With Assistance. Mod assist  Outcome: Progressing

## 2018-04-19 NOTE — Patient Care Conference (Addendum)
Inpatient RehabilitationTeam Conference and Plan of Care Update Date: 04/17/2018   Time: 2:45 PM    Patient Name: [name].      Medical Record Number: [id number]  Date of Birth: 1940/09/28 Sex: Male         Room/Bed: 4M04C/4M04C-01 Payor Info: Payor: MEDICARE / Plan: MEDICARE PART A AND B / Product Type: *No Product type* /    Admitting Diagnosis: Parkinson Dz  Admit Date/Time:  04/15/2018  3:22 PM Admission Comments: No comment available   Primary Diagnosis:  <principal problem not specified> Principal Problem: <principal problem not specified>  Patient Active Problem List   Diagnosis Date Noted  . Sleep disturbance   . Acute blood loss anemia   . Bradykinesia   . Debility 04/15/2018  . Overactive bladder   . Slow transit constipation   . Diastolic dysfunction   . Thrombocytopenia ([place])   . Hypertension   . Generalized weakness   . TIA (transient ischemic attack) 04/08/2018  . Dementia due to Parkinson's disease without behavioral disturbance ([place]) 12/16/2015  . S/P TAVR (transcatheter aortic valve replacement) 11/18/2013  . Aortic valve disorder 11/18/2013  . Parkinson's disease ([place])   . Severe aortic stenosis 09/26/2013  . Idiopathic Parkinson's disease ([place]) 10/23/2011    Expected Discharge Date: Expected Discharge Date: 04/30/18  Team Members Present: Physician leading conference: Dr. Delice Lesch Social Worker Present: Lennart Pall, LCSW Nurse Present: Benjie Karvonen, RN PT Present: Phylliss Bob, PTA;Barrie Folk, PT OT Present: Clyda Greener, OT SLP Present: Charolett Bumpers, SLP PPS Coordinator present : Daiva Nakayama, RN, CRRN;Melissa Gertie Fey     Current Status/Progress Goal Weekly Team Focus  Medical   Debility secondary to  Parkinson's disease/multi-medical  Improve mobility, transfers, cognition, sleep, thrombocytopenia  See above   Bowel/Bladder   LBM 04/14/18, CONT B/B  CONTINUE TO ASSIST WITH TOILETING       Swallow/Nutrition/ Hydration   full supervision due to cognition, reg and thin         ADL's   supervision for UB bathing with min assist for LB bathing, min assist for UB dressing with mod assist for LB dressing as well as functional mobility to the shower and toilet with hand held assist.  Decreased memory and safety awareness.   supervision overall  selfcare retraining, transfer training, neuromuscular re-education, balance retraining, pt/family education, DME education   Mobility   min Assist overall for mobility with occassional Mod assist wiht sit<>stand and transfers. brady kinesia and flexed posture due to PD are the greatest limiting factors at this time.   Ambulatory at Supervision assist overall with RW. Supervision assist transfers and bed mobillty   improved safety and independence with gait and transfers. balance, endurance, postural control. PD education.   Communication   Max-Mod A  Min A  expressing wants/needs, speech intelligibility at phrase level, opening mouth, vocal intensity and logical thought   Safety/Cognition/ Behavioral Observations  Max-mod A  Min A  basic problem solving, sustained attention, intellectual awareness   Pain   DENIES PAIN   CONTINUE TO ASSESS PAIN LEVEL, ADMINISTERED PAIN REGIMEN AS NEEDED   ASSESS NONVERBAL/VERBAL EACH SHIFT AND AS NEEDED    Skin   NO SKIN ISSUES   CONTINUE TO ASSESS SKIN EACH SHIFT AND AS NEEDED   SKIN REMAIN INTACT     Rehab Goals Patient on target to meet rehab goals: No Rehab Goals Revised: still with significant confusion which may affect goals. *See Care Plan and progress notes for long  and short-term goals.     Barriers to Discharge  Current Status/Progress Possible Resolutions Date Resolved   Physician    Medical stability;Decreased caregiver support;Lack of/limited family support     See above  Therapies, optimize sleep meds, follow labs      Nursing  Lack of/limited family support  per sister;s report, daughter only available a few hours on  weekends to assist patient. Sister concerned with goal to have patient home independent.             PT  Medical stability                 OT Decreased caregiver support                SLP                SW                Discharge Planning/Teaching Needs:  Plan for pt to d/c to his home with family to arrange 24/7 support, however, this may be time limited.  Teaching to be completed with family prior to d/c.   Team Discussion:  Began sleep chart;  Pt with some paranoia this morning per daughter;  Adjust PD meds.  incont bladder and constipated.  Mod - max with transfers;  Decreased safety awareness.  Min assist amb 100'.  Concern may be having some hospital delirium.  Mod/max for cognition.  Dtr feels that if pt's bowel function is regular and he is getting better sleep that he will show improvement. Team feels family must plan for 24/7 supervision at the least if significant gains aren't made.  Revisions to Treatment Plan:  NA    Continued Need for Acute Rehabilitation Level of Care: The patient requires daily medical management by a physician with specialized training in physical medicine and rehabilitation for the following conditions: Daily direction of a multidisciplinary physical rehabilitation program to ensure safe treatment while eliciting the highest outcome that is of practical value to the patient.: Yes Daily medical management of patient stability for increased activity during participation in an intensive rehabilitation regime.: Yes Daily analysis of laboratory values and/or radiology reports with any subsequent need for medication adjustment of medical intervention for : Neurological problems;Other;Mood/behavior problems   I attest that I was present, lead the team conference, and concur with the assessment and plan of the team.   Julieta Rogalski 04/19/2018, 12:31 PM

## 2018-04-19 NOTE — Progress Notes (Signed)
Physical Therapy Session Note  Patient Details  Name: [name]. MRN: [id number] Date of Birth: 1940/06/27  Today's Date: 04/19/2018 PT Individual Time: 0800-0900 and 1550-1620 PT Individual Time Calculation (min): 60 min and 30 min  Short Term Goals: Week 1:  PT Short Term Goal 1 (Week 1): Pt will demonstrate appropriate sequencing and initiation of functional mobility 50% of the time PT Short Term Goal 2 (Week 1): Pt will ambulate 58' w/ CGA w/ LRAD PT Short Term Goal 3 (Week 1): Pt will maintain dynamic standing balance w/ min assist PT Short Term Goal 4 (Week 1): Pt will participate in 30 min of upright activity w/o increase in fatigue.   Skilled Therapeutic Interventions/Progress Updates:    Session One: Pt received seated in WC and agreeable to PT.   PT assisted pt in dressing tasks. Mod assist for donning pants; cuing and manual facilitation for placement of B LE. Min assist for sit to stand with RW to prevent initial posterior LOB. Max assist to pull up pants in standing. Min assist with donning shirt for buttons and tucking shirt into pants. Total assist for donning socks and shoes for time management.   Standing balance on red wedge. Pt completed color sorting activity while standing with RW x2. Progressed activity from yesterday's session to include less cuing for cognitive task and increase balance challenge by stepping further forward onto wedge. Pt mod assist initially progressing to min assist; required mod cuing and initial assistance to prevent posterior LOB. Pt able to sustain standing position for up to 5 minutes.  Gait training throughout session x49f and [id number] RW, min assist. Improved turns with less cuing needed to prevent adduction of LLE. Min verbal cues for management of AD during gait and increased step length. Min cues for sequencing pivot [facility]  Ended session with pt seated in WC, call bell within reach, and all needs met.   Session  Two: Pt received seated in WC and agreeable to PT.   Gait training [id number] RW, min assist. Occasional LOB due to narrow BOS, PT corrected. Worked on turns with gait with mod verbal cuing for LE/AD sequencing and increased step length when turning. Min cues for sequencing pivot to return to WC.[facility] LtdSit to stand x5 from WC [facility] RW. Min assist progressing to CGATrentonr last 2 reps. Pt initially demonstrating posterior lean/LOB when standing, but with max cuing for hand placement and increased anterior lean, able to maintain balance independently for last two reps.  Ended session with pt seated in WC, call bell within reach, and all needs met.   Therapy Documentation Precautions:  Precautions Precautions: Fall Restrictions Weight Bearing Restrictions: No  Pain: Pain Assessment Pain Scale: 0-10 Pain Score: 0-No pain   Therapy/Group: Individual Therapy  AmaAmador Cunas/10/2017, 10:45 AM

## 2018-04-19 NOTE — Progress Notes (Signed)
Social Work Patient ID: Bryce Garbe., male   DOB: September 09, 1940, 77 y.o.   MRN: [id number]   Spoke with pt and daughter yesterday to review team conference.  Daughter very happy that pt's bowel function is improved and hopeful his overall picture will improve as well.  She is aware and agreeable with targeted d/c date of 12/17 and supervision/ min assist goals overall.  She believes that he will "improve even more once he gets back into his home and a familiar place".  Explained that family needs to be prepared to cover 24/7 at d/c.    Bryce Hoogland, LCSW

## 2018-04-19 NOTE — Progress Notes (Signed)
[facility] PHYSICAL MEDICINE & REHABILITATION PROGRESS NOTE  Subjective/Complaints: Patient seen sitting up in his bed this morning.  He states he did not sleep well overnight.  Per sleep chart, patient slept well overnight.  Remains confused.  ROS: Denies CP, SOB, N/V/D.  Objective: Vital Signs: Blood pressure (!) 121/52, pulse 68, temperature 98.6 F (37 C), temperature source Oral, resp. rate 18, height 5\' 6"  (1.676 m), weight 58.1 kg, SpO2 99 %. No results found. Recent Labs    04/19/18 0526  WBC 6.3  HGB 11.9*  HCT 37.1*  PLT 112*   Recent Labs    04/19/18 0526  NA 140  K 4.1  CL 107  CO2 27  GLUCOSE 112*  BUN 20  CREATININE 1.23  CALCIUM 8.7*    Physical Exam: BP (!) 121/52 (BP Location: Right Arm)   Pulse 68   Temp 98.6 F (37 C) (Oral)   Resp 18   Ht 5\' 6"  (1.676 m)   Wt 58.1 kg   SpO2 99%   BMI 20.67 kg/m  Gen: NAD. Frail. HENT: Normocephalic and atraumatic.  Eyes: EOMI. No discharge.   Cardiovascular: RRR.  No JVD. Respiratory: Effort normal and breath sounds normal.  GI: Bowel sounds are normal. He exhibits no distension.  Musculoskeletal: No edema or tenderness in extremities  Neurological:  Alert and oriented x1 Makes good eye contact with examiner.  Follow simple commands. Motor: Grossly 4+/5 throughout, limited by bradykinesia and rigidity, stable Skin: Skin is warm and dry.  Psychiatric: His affect is blunt. His speech is delayed and slurred. He is slowed. Cognition and memory are impaired.   Assessment/Plan: 1. Functional deficits secondary to Parkinson's disease which require 3+ hours per day of interdisciplinary therapy in a comprehensive inpatient rehab setting.  Physiatrist is providing close team supervision and 24 hour management of active medical problems listed below.  Physiatrist and rehab team continue to assess barriers to discharge/monitor patient progress toward functional and medical goals  Care Tool:  Bathing     Body parts bathed by patient: Right arm, Left arm, Chest, Abdomen, Front perineal area, Buttocks, Right upper leg, Left upper leg, Right lower leg, Left lower leg, Face         Bathing assist Assist Level: Moderate Assistance - Patient 50 - 74%     Upper Body Dressing/Undressing Upper body dressing   What is the patient wearing?: Button up shirt    Upper body assist Assist Level: Minimal Assistance - Patient > 75%    Lower Body Dressing/Undressing Lower body dressing      What is the patient wearing?: Underwear/pull up, Pants     Lower body assist Assist for lower body dressing: Minimal Assistance - Patient > 75%     Toileting Toileting    Toileting assist Assist for toileting: Moderate Assistance - Patient 50 - 74%     Transfers Chair/bed transfer  Transfers assist     Chair/bed transfer assist level: Minimal Assistance - Patient > 75%     Locomotion Ambulation   Ambulation assist      Assist level: Minimal Assistance - Patient > 75% Assistive device: Walker-rolling Max distance: 70   Walk 10 feet activity   Assist     Assist level: Minimal Assistance - Patient > 75% Assistive device: Walker-rolling   Walk 50 feet activity   Assist    Assist level: Minimal Assistance - Patient > 75% Assistive device: Walker-rolling    Walk 150 feet activity   Assist  Walk 150 feet activity did not occur: Safety/medical concerns         Walk 10 feet on uneven surface  activity   Assist Walk 10 feet on uneven surfaces activity did not occur: Safety/medical concerns         Wheelchair     Assist   Type of Wheelchair: Manual    Wheelchair assist level: Contact Guard/Touching assist Max wheelchair distance: 120    Wheelchair 50 feet with 2 turns activity    Assist        Assist Level: Contact Guard/Touching assist   Wheelchair 150 feet activity     Assist Wheelchair 150 feet activity did not occur: Safety/medical  concerns          Medical Problem List and Plan: 1.  Debility secondary to  Parkinson's disease/multi-medical  Continue CIR 2.  DVT Prophylaxis/Anticoagulation: Subcutaneous Lovenox 3. Pain Management:  Tylenol as needed 4. Mood:  Klonopin 0.5 mg daily at bedtime, Aricept 10 mg daily at bedtime  Zoloft 12.5 mg daily d/ced on 12/4 per daughter (plan was to wean) 5. Neuropsych: This patient is not capable of making decisions on his own behalf. 6. Skin/Wound Care:  Routine skin checks 7. Fluids/Electrolytes/Nutrition:  Routine in and out's   BMP within acceptable range on 12/6  Encourage fluids 8. Parkinson's disease. Sinemet 2 tablets 4 times a day. Follow-up outpatient neurology Dr. Skipper Cliche 9.  History of aortic stenosis. Status post TAVR procedure. Continue aspirin 10. Constipation. Laxative assistance 11.Overactive bladder. Myrbetriq 12. ABLA  Hemoglobin 11.9 on 12/6  Continue to monitor 13.  Thrombocytopenia  Platelets 112 on 12/6  Continue to monitor 14.?  Sleep disturbance  Sleep chart   LOS: 4 days A FACE TO FACE EVALUATION WAS PERFORMED  Ankit Lorie Phenix 04/19/2018, 9:22 AM

## 2018-04-19 NOTE — Progress Notes (Signed)
Speech Language Pathology Daily Session Note  Patient Details  Name: [name]. MRN: [id number] Date of Birth: [birth year]  Today's Date: 04/19/2018 SLP Individual Time: 1406-1500 SLP Individual Time Calculation (min): 54 min  Short Term Goals: Week 1: SLP Short Term Goal 1 (Week 1): Pt will demonstrate sustained attention to function task in 2 minute intervals with mod A verbal cues. SLP Short Term Goal 2 (Week 1): Pt will express wants/needs in response to verbal questions with min A verbal cues. SLP Short Term Goal 3 (Week 1): Pt will utilize speech intelligibility strategies at phrase level with max A verbal cues to achieve 50% intelligibility. SLP Short Term Goal 4 (Week 1): Pt will demonstrate basic problem solving in functional task with Mod A verbal cues. SLP Short Term Goal 5 (Week 1): Pt will idenitify 2 cognitive and 2 physical deficits due to acute changes with Mod A verbal cues.  Skilled Therapeutic Interventions:  Pt was seen for skilled ST targeting cognitive-communication goals.  Pt was awake and seated up in wheelchair with sister at bedside upon therapist's arrival.  Sister reports that pt is not vastly altered from his baseline for cognition and communication and described a carefully controlled and sedentary lifestyle prior to admission (pt's children would call to remind him to take his medications, check on him every afternoon, provided him with microwave meals which pt could prepare himself, pt would dress himself in the morning and then spent "a lot of time on the couch").    Pt's sister endorses concerns about pt returning home by himself but pt says that he thinks he will be fine as long as he "follows all the rules."  While pt may not be vastly altered from his baseline cognition, he sounds like he could benefit from outpatient ST services with a therapist who specializes in Parkinson's management post discharge.  Pt needed mod-max cues to increase vocal intensity  to achieve intelligibility at the conversational level during functional conversations with therapist.  Pt was returned to room and left in room with call bell within reach, chair alarm set.  Continue per current plan of care.    Pain Pain Assessment Pain Scale: 0-10 Pain Score: 0-No pain  Therapy/Group: Individual Therapy  Tawanna Funk, Selinda Orion 04/19/2018, 5:11 PM

## 2018-04-19 NOTE — Progress Notes (Signed)
Occupational Therapy Session Note  Patient Details  Name: [name]. MRN: [id number] Date of Birth: 1940/10/27  Today's Date: 04/19/2018 OT Individual Time: 7371-0626 OT Individual Time Calculation (min): 60 min    Short Term Goals: Week 1:  OT Short Term Goal 1 (Week 1): Pt will perform LB bathing sit to stand with supervision for 2 consecutive sessions.  OT Short Term Goal 2 (Week 1): Pt will perform LB dressing sit to stand with supervision and 2 consecutive sessions.  OT Short Term Goal 3 (Week 1): Pt will complete all aspects of toileting with supervision using LRAD and elevated toilet.  OT Short Term Goal 4 (Week 1): Pt will demonstrate emergent awareness of balance issues and sit down to thread LB clothing with no more than one questioning cue.   Skilled Therapeutic Interventions/Progress Updates:    Pt pleasant and cooperative during session.  He agreed to washing up at the sink and changing his clothes.  Min instructional cueing to sequence through bathing with mod instructional cueing and min assist for sequencing sit to stand.  He was able to complete all dressing with min assist as well.  He does require use of a step stool for washing his feet and helping to donn some of his LB clothing.  Mod instructional cueing to not try and stand to donn pants.  He was able to complete 75% of the buttons on his shirt but needed assist from therapist for buttons on his sleeves.  Min assist for standing balance while combing his hair and brushing his teeth.  Finished session with call button in lap and safety alarm belt in place.  Pt oriented to place and month this session.    Therapy Documentation Precautions:  Precautions Precautions: Fall Restrictions Weight Bearing Restrictions: No  Pain: Pain Assessment Pain Scale: 0-10 Pain Score: 0-No pain ADL: See Care Tool Section for some details of ADL.  Therapy/Group: Individual Therapy  [name],[name] 04/19/2018, 12:14  PM

## 2018-04-20 ENCOUNTER — Inpatient Hospital Stay (HOSPITAL_COMMUNITY): Payer: Medicare Other | Admitting: Physical Therapy

## 2018-04-20 ENCOUNTER — Inpatient Hospital Stay (HOSPITAL_COMMUNITY): Payer: Medicare Other | Admitting: Occupational Therapy

## 2018-04-20 ENCOUNTER — Inpatient Hospital Stay (HOSPITAL_COMMUNITY): Payer: Medicare Other | Admitting: Speech Pathology

## 2018-04-20 NOTE — Progress Notes (Signed)
Speech Language Pathology Daily Session Note  Patient Details  Name: [name]. MRN: [id number] Date of Birth: 09/19/1940  Today's Date: 04/20/2018 SLP Individual Time: 1100-1130 SLP Individual Time Calculation (min): 30 min  Short Term Goals: Week 1: SLP Short Term Goal 1 (Week 1): Pt will demonstrate sustained attention to function task in 2 minute intervals with mod A verbal cues. SLP Short Term Goal 2 (Week 1): Pt will express wants/needs in response to verbal questions with min A verbal cues. SLP Short Term Goal 3 (Week 1): Pt will utilize speech intelligibility strategies at phrase level with max A verbal cues to achieve 50% intelligibility. SLP Short Term Goal 4 (Week 1): Pt will demonstrate basic problem solving in functional task with Mod A verbal cues. SLP Short Term Goal 5 (Week 1): Pt will idenitify 2 cognitive and 2 physical deficits due to acute changes with Mod A verbal cues.  Skilled Therapeutic Interventions:  Skilled treatment session focused on communication goals and education with pt on how to increase speech intelligibility. SLP received pt upright in wheelchair following PT. Pt with appropriate vocal intensity at the simple sentence level. However pt's vocal quality is tremorous and his tremors are impactful to his articulators. As a result, his speech is moderately dysfluent in nature when describing pictures in a photo album at the phrase level. General education provided on speech and pt able to state that he occasionally asks people to give him time to think (I.e., extra time to produce speech). Pt able to make wants and needs known during this session. Pt left upright in wheelchair with lap belt alarm on and all needs within reach.      Pain Pain Assessment Pain Scale: 0-10 Pain Score: 0-No pain  Therapy/Group: Individual Therapy  Shanin Szymanowski 04/20/2018, 11:17 AM

## 2018-04-20 NOTE — Progress Notes (Signed)
Physical Therapy Session Note  Patient Details  Name: [name]. MRN: [id number] Date of Birth: Oct 21, 1940  Today's Date: 04/20/2018 PT Individual Time: 9150-5697 PT Individual Time Calculation (min): 60 min   Short Term Goals: Week 1:  PT Short Term Goal 1 (Week 1): Pt will demonstrate appropriate sequencing and initiation of functional mobility 50% of the time PT Short Term Goal 2 (Week 1): Pt will ambulate 11' w/ CGA w/ LRAD PT Short Term Goal 3 (Week 1): Pt will maintain dynamic standing balance w/ min assist PT Short Term Goal 4 (Week 1): Pt will participate in 30 min of upright activity w/o increase in fatigue.   Skilled Therapeutic Interventions/Progress Updates:    Patient received in [place], asleep but easily woken by verbal stimuli and willing to participate in PT session. Noted patient shirt wet (unsure if spilled drink or saliva), so used this as a cognitive/fine motor activity for sequencing and working out undoing/doing up buttons and changing out shirt; patient required extended time for this task as well as Mod cues for correct placement of buttons and button management. Transported to day room totalA in [facility] and able to transfer to Nustep with MinA using RW, Mod cues for hand placement and safety/sequencing during transfers. Tolerated riding Nustep at level 4 with B UEs/LEs and average steps 75-85SPM well for reciprocal movement and functional activity tolerance training. Able to ambulate multiple distances of 100-113f with RW and MinA for safe distance from device and for balance without difficulty, states "I slept well last night!". He was left up in his WC with all needs met and seat belt alarm active this morning.   Therapy Documentation Precautions:  Precautions Precautions: Fall Restrictions Weight Bearing Restrictions: No General:   Vital Signs:   Pain: Pain Assessment Pain Scale: Faces Pain Score: 0-No pain Faces Pain Scale: No hurt    Therapy/Group:  Individual Therapy  KDeniece ReePT, DPT, CBIS  Supplemental Physical Therapist [facility]   Pager [phone] Rehab Office [phone]  04/20/2018, 11:27 AM

## 2018-04-20 NOTE — Progress Notes (Signed)
Physical Therapy Session Note  Patient Details  Name: [name]. MRN: [id number] Date of Birth: April 24, 1941  Today's Date: 04/20/2018 PT Individual Time: 0800-0845 PT Individual Time Calculation (min): 45 min   Short Term Goals: Week 1:  PT Short Term Goal 1 (Week 1): Pt will demonstrate appropriate sequencing and initiation of functional mobility 50% of the time PT Short Term Goal 2 (Week 1): Pt will ambulate 69' w/ CGA w/ LRAD PT Short Term Goal 3 (Week 1): Pt will maintain dynamic standing balance w/ min assist PT Short Term Goal 4 (Week 1): Pt will participate in 30 min of upright activity w/o increase in fatigue.   Skilled Therapeutic Interventions/Progress Updates:    Pt presented in [facility] and agreeable to PT. Mod assistance for donning socks and shoes in seated. Self propelled WC 150 ft to rehab gym with supervision assist for making turns and navigating obstacles.  Gait training throughout session x131f, x770fwith RW and min assist. Verbal cues to maintain RW within BOS. Improved carryover from last session for LE/AD sequencing when making turns. Still requires mod verbal cues for pivots to [facility] Standing Balance with clothespins x2. Placing yellow and green clothespins on middle/top row of bin and returning to basket while standing on red wedge. Progressed activity from yesterday's session to include higher level cognitive task. Pt min assist initially progressing to CGA; required min cuing and initial assistance to prevent posterior LOB. Pt able to sustain standing position for up to 5 min.  STS x10 with RW; focus on anterior weight shift. Pt demonstrated improved carryover from previous sessions and was CGA for activity. No posterior LOB noted. Pt required mod cues initially for hand placement on RW progressing to min cues.   Ended session with pt seated in WC, call bell within reach, and all needs met.  Therapy Documentation Precautions:   Precautions Precautions: Fall Restrictions Weight Bearing Restrictions: No   Pain: Pain Assessment Pain Scale: Faces Pain Score: 0-No pain Faces Pain Scale: No hurt    Therapy/Group: Individual Therapy  AmAmador Cunas2/11/2017, 12:28 PM

## 2018-04-20 NOTE — Progress Notes (Signed)
Occupational Therapy Session Note  Patient Details  Name: [name]. MRN: [id number] Date of Birth: August 21, 1940  Today's Date: 04/20/2018 OT Individual Time: 1500-1600 OT Individual Time Calculation (min): 60 min    Short Term Goals: Week 1:  OT Short Term Goal 1 (Week 1): Pt will perform LB bathing sit to stand with supervision for 2 consecutive sessions.  OT Short Term Goal 2 (Week 1): Pt will perform LB dressing sit to stand with supervision and 2 consecutive sessions.  OT Short Term Goal 3 (Week 1): Pt will complete all aspects of toileting with supervision using LRAD and elevated toilet.  OT Short Term Goal 4 (Week 1): Pt will demonstrate emergent awareness of balance issues and sit down to thread LB clothing with no more than one questioning cue.       Skilled Therapeutic Interventions/Progress Updates:    Pt seen this session for there ex to promote coordination and smooth movement patterns. Pt received in w[place] and agreeable to therapy.  Taken to gym and transferred to mat to work on large ROM of shoulders using therapy ball, hula hoop, dowel bar. Pt participated extremely well and did well with the exercises.  Sit to stand practice with holding a large ball and then holding a dowel with adding in a reach towards the floor with each stand practice with CGA.  [facility] with fastening large buttons. He has difficulty due to decreased strength and coordination with tip pinch.  Used yellow and red clothes pins in B hands without difficulty and then practiced popping bubbles in packaging with B hands.  Pt returned to room and used HHA to transfer back to bed.  Pt resting in bed with alarm set and all needs met.  Therapy Documentation Precautions:  Precautions Precautions: Fall Restrictions Weight Bearing Restrictions: No Vital Signs: Therapy Vitals Temp: 98 F (36.7 C) Pulse Rate: 65 Resp: 17 BP: (!) 97/58 Patient Position (if appropriate): Sitting Oxygen Therapy SpO2: 100  % O2 Device: Room Air Pain: Pain Assessment Pain Score: 0-No pain ADL: ADL Eating: Maximal assistance Where Assessed-Eating: Wheelchair Grooming: Supervision/safety Where Assessed-Grooming: Bed level Upper Body Bathing: Supervision/safety Where Assessed-Upper Body Bathing: Shower Lower Body Bathing: Minimal assistance Where Assessed-Lower Body Bathing: Shower Upper Body Dressing: Minimal assistance Where Assessed-Upper Body Dressing: Chair Lower Body Dressing: Moderate assistance Where Assessed-Lower Body Dressing: Chair Toileting: Moderate assistance Toilet Transfer: Moderate assistance Toilet Transfer Method: Counselling psychologist: Grab bars, Raised toilet seat Tub/Shower Transfer: Moderate assistance Tub/Shower Transfer Method: Optometrist: Walk in Retail buyer: Environmental education officer Method: Heritage manager: Radio broadcast assistant  Therapy/Group: Individual Therapy  [place] 04/20/2018, 4:27 PM

## 2018-04-20 NOTE — Plan of Care (Signed)
  Problem: RH SAFETY Goal: RH STG ADHERE TO SAFETY PRECAUTIONS W/ASSISTANCE/DEVICE Description STG Adhere to Safety Precautions With Assistance/Device. Mod assist  Outcome: Progressing Goal: RH STG DECREASED RISK OF FALL WITH ASSISTANCE Description STG Decreased Risk of Fall With Assistance. Mod assist  Outcome: Progressing  Call light within reach, proper footwear, bed/chair alarm, staff assist pt with transfers and bathroom needs.

## 2018-04-20 NOTE — Progress Notes (Signed)
[place] PHYSICAL MEDICINE & REHABILITATION PROGRESS NOTE  Subjective/Complaints:  Hypophonic dysarthria  ROS: Denies CP, SOB, N/V/D.  Objective: Vital Signs: Blood pressure 125/69, pulse 64, temperature 98.5 F (36.9 C), temperature source Oral, resp. rate 15, height 5\' 6"  (1.676 m), weight 58.1 kg, SpO2 97 %. No results found. Recent Labs    04/19/18 0526  WBC 6.3  HGB 11.9*  HCT 37.1*  PLT 112*   Recent Labs    04/19/18 0526  NA 140  K 4.1  CL 107  CO2 27  GLUCOSE 112*  BUN 20  CREATININE 1.23  CALCIUM 8.7*    Physical Exam: BP 125/69 (BP Location: Right Arm)   Pulse 64   Temp 98.5 F (36.9 C) (Oral)   Resp 15   Ht 5\' 6"  (1.676 m)   Wt 58.1 kg   SpO2 97%   BMI 20.67 kg/m  Gen: NAD. Frail. HENT: Normocephalic and atraumatic.  Eyes: EOMI. No discharge.   Cardiovascular: RRR.  No JVD. Respiratory: Effort normal and breath sounds normal.  GI: Bowel sounds are normal. He exhibits no distension.  Musculoskeletal: No edema or tenderness in extremities  Neurological:  Alert and oriented x1 Makes good eye contact with examiner.  Follow simple commands. Motor: Grossly 4+/5 throughout, limited by bradykinesia and rigidity, stable Skin: Skin is warm and dry.  Psychiatric: His affect is blunt. His speech is delayed and slurred. He is slowed. Cognition and memory are impaired.   Assessment/Plan: 1. Functional deficits secondary to Parkinson's disease which require 3+ hours per day of interdisciplinary therapy in a comprehensive inpatient rehab setting.  Physiatrist is providing close team supervision and 24 hour management of active medical problems listed below.  Physiatrist and rehab team continue to assess barriers to discharge/monitor patient progress toward functional and medical goals  Care Tool:  Bathing    Body parts bathed by patient: Right arm, Left arm, Chest, Abdomen, Front perineal area, Buttocks, Right upper leg, Left upper leg, Right lower  leg, Left lower leg, Face         Bathing assist Assist Level: Minimal Assistance - Patient > 75%     Upper Body Dressing/Undressing Upper body dressing   What is the patient wearing?: Button up shirt    Upper body assist Assist Level: Minimal Assistance - Patient > 75%    Lower Body Dressing/Undressing Lower body dressing      What is the patient wearing?: Underwear/pull up, Pants     Lower body assist Assist for lower body dressing: Minimal Assistance - Patient > 75%     Toileting Toileting    Toileting assist Assist for toileting: Minimal Assistance - Patient > 75%     Transfers Chair/bed transfer  Transfers assist     Chair/bed transfer assist level: Minimal Assistance - Patient > 75%     Locomotion Ambulation   Ambulation assist      Assist level: Minimal Assistance - Patient > 75% Assistive device: Walker-rolling Max distance: 120   Walk 10 feet activity   Assist     Assist level: Minimal Assistance - Patient > 75% Assistive device: Walker-rolling   Walk 50 feet activity   Assist    Assist level: Minimal Assistance - Patient > 75% Assistive device: Walker-rolling    Walk 150 feet activity   Assist Walk 150 feet activity did not occur: Safety/medical concerns         Walk 10 feet on uneven surface  activity   Assist Walk 10  feet on uneven surfaces activity did not occur: Safety/medical concerns         Wheelchair     Assist   Type of Wheelchair: Manual    Wheelchair assist level: Contact Guard/Touching assist Max wheelchair distance: 120    Wheelchair 50 feet with 2 turns activity    Assist        Assist Level: Contact Guard/Touching assist   Wheelchair 150 feet activity     Assist Wheelchair 150 feet activity did not occur: Safety/medical concerns          Medical Problem List and Plan: 1.  Debility secondary to  Parkinson's disease/multi-medical  Continue CIR PT< OT, SLP 2.  DVT  Prophylaxis/Anticoagulation: Subcutaneous Lovenox may be causing  plt low at 112K, monitor 3. Pain Management:  Tylenol as needed 4. Mood:  Klonopin 0.5 mg daily at bedtime, Aricept 10 mg daily at bedtime  Zoloft 12.5 mg daily d/ced on 12/4 per daughter (plan was to wean) 5. Neuropsych: This patient is not capable of making decisions on his own behalf. 6. Skin/Wound Care:  Routine skin checks 7. Fluids/Electrolytes/Nutrition:  Routine in and out's   BMP within acceptable range on 12/6  Encourage fluids 8. Parkinson's disease. Sinemet 2 tablets 4 times a day. Follow-up outpatient neurology Dr. Skipper Cliche 9.  History of aortic stenosis. Status post TAVR procedure. Continue aspirin 10. Constipation. Laxative assistance 11.Overactive bladder. Myrbetriq 12. ABLA  Hemoglobin 11.9 on 12/6  Continue to monitor 13.  Thrombocytopenia  Platelets 112 on 12/6  Continue to monitor 14.?  Sleep disturbance  Sleep chart   LOS: 5 days A FACE TO FACE EVALUATION WAS PERFORMED  Charlett Blake 04/20/2018, 7:31 AM

## 2018-04-21 NOTE — Progress Notes (Signed)
[place] PHYSICAL MEDICINE & REHABILITATION PROGRESS NOTE  Subjective/Complaints:  Awake this am, no issues  ROS: Denies CP, SOB, N/V/D.  Objective: Vital Signs: Blood pressure (!) 111/53, pulse 64, temperature 98.1 F (36.7 C), temperature source Oral, resp. rate 18, height 5\' 6"  (1.676 m), weight 58.1 kg, SpO2 97 %. No results found. Recent Labs    04/19/18 0526  WBC 6.3  HGB 11.9*  HCT 37.1*  PLT 112*   Recent Labs    04/19/18 0526  NA 140  K 4.1  CL 107  CO2 27  GLUCOSE 112*  BUN 20  CREATININE 1.23  CALCIUM 8.7*    Physical Exam: BP (!) 111/53 (BP Location: Right Arm)   Pulse 64   Temp 98.1 F (36.7 C) (Oral)   Resp 18   Ht 5\' 6"  (1.676 m)   Wt 58.1 kg   SpO2 97%   BMI 20.67 kg/m  Gen: NAD. Frail. HENT: Normocephalic and atraumatic.  Eyes: EOMI. No discharge.   Cardiovascular: RRR.  No JVD. Respiratory: Effort normal and breath sounds normal.  GI: Bowel sounds are normal. He exhibits no distension.  Musculoskeletal: No edema or tenderness in extremities  Neurological:  Alert and oriented x1 Makes good eye contact with examiner.  Follow simple commands. Motor: Grossly 4+/5 throughout, limited by bradykinesia and rigidity, stable Skin: Skin is warm and dry.  Psychiatric: His affect is blunt. His speech is delayed and slurred. He is slowed. Cognition and memory are impaired.   Assessment/Plan: 1. Functional deficits secondary to Parkinson's disease which require 3+ hours per day of interdisciplinary therapy in a comprehensive inpatient rehab setting.  Physiatrist is providing close team supervision and 24 hour management of active medical problems listed below.  Physiatrist and rehab team continue to assess barriers to discharge/monitor patient progress toward functional and medical goals  Care Tool:  Bathing    Body parts bathed by patient: Right arm, Left arm, Chest, Abdomen, Front perineal area, Buttocks, Right upper leg, Left upper leg,  Right lower leg, Left lower leg, Face         Bathing assist Assist Level: Minimal Assistance - Patient > 75%     Upper Body Dressing/Undressing Upper body dressing   What is the patient wearing?: Button up shirt    Upper body assist Assist Level: Minimal Assistance - Patient > 75%    Lower Body Dressing/Undressing Lower body dressing      What is the patient wearing?: Underwear/pull up, Pants     Lower body assist Assist for lower body dressing: Minimal Assistance - Patient > 75%     Toileting Toileting    Toileting assist Assist for toileting: Moderate Assistance - Patient 50 - 74%     Transfers Chair/bed transfer  Transfers assist     Chair/bed transfer assist level: Moderate Assistance - Patient 50 - 74%     Locomotion Ambulation   Ambulation assist      Assist level: Minimal Assistance - Patient > 75% Assistive device: Walker-rolling Max distance: 120   Walk 10 feet activity   Assist     Assist level: Minimal Assistance - Patient > 75% Assistive device: Walker-rolling   Walk 50 feet activity   Assist    Assist level: Minimal Assistance - Patient > 75% Assistive device: Walker-rolling    Walk 150 feet activity   Assist Walk 150 feet activity did not occur: Safety/medical concerns  Assist level: Minimal Assistance - Patient > 75% Assistive device: Walker-rolling  Walk 10 feet on uneven surface  activity   Assist Walk 10 feet on uneven surfaces activity did not occur: Safety/medical concerns         Wheelchair     Assist   Type of Wheelchair: Manual    Wheelchair assist level: Supervision/Verbal cueing Max wheelchair distance: 150    Wheelchair 50 feet with 2 turns activity    Assist        Assist Level: Supervision/Verbal cueing   Wheelchair 150 feet activity     Assist Wheelchair 150 feet activity did not occur: Safety/medical concerns   Assist Level: Supervision/Verbal cueing       Medical Problem List and Plan: 1.  Debility secondary to  Parkinson's disease/multi-medical  Continue CIR PT< OT, SLP 2.  DVT Prophylaxis/Anticoagulation: Subcutaneous Lovenox may be causing  plt low at 112K, recheck in am 3. Pain Management:  Tylenol as needed 4. Mood:  Klonopin 0.5 mg daily at bedtime, Aricept 10 mg daily at bedtime  Zoloft 12.5 mg daily d/ced on 12/4 per daughter (plan was to wean) 5. Neuropsych: This patient is not capable of making decisions on his own behalf. 6. Skin/Wound Care:  Routine skin checks 7. Fluids/Electrolytes/Nutrition:  Routine in and out's   BMP within acceptable range on 12/6  Encourage fluids 8. Parkinson's disease. Sinemet 2 tablets 4 times a day. Follow-up outpatient neurology Dr. Skipper Cliche 9.  History of aortic stenosis. Status post TAVR procedure. Continue aspirin 10. Constipation. Laxative assistance 11.Overactive bladder. Myrbetriq 12. ABLA  Hemoglobin 11.9 on 12/6  Continue to monitor 13.  Thrombocytopenia  Platelets 112 on 12/6  Continue to monitor 14.?  Sleep disturbance  Sleep chart - indicates 10hrs last noc  LOS: 6 days A FACE TO FACE EVALUATION WAS PERFORMED  Charlett Blake 04/21/2018, 7:31 AM

## 2018-04-22 ENCOUNTER — Inpatient Hospital Stay (HOSPITAL_COMMUNITY): Payer: Medicare Other | Admitting: Physical Therapy

## 2018-04-22 ENCOUNTER — Inpatient Hospital Stay (HOSPITAL_COMMUNITY): Payer: Medicare Other | Admitting: Speech Pathology

## 2018-04-22 ENCOUNTER — Inpatient Hospital Stay (HOSPITAL_COMMUNITY): Payer: Medicare Other | Admitting: Occupational Therapy

## 2018-04-22 LAB — CREATININE, SERUM
Creatinine, Ser: 1.13 mg/dL (ref 0.61–1.24)
GFR calc non Af Amer: >60 mL/min (ref >60)

## 2018-04-22 LAB — CBC
HCT: 37.8 % — ABNORMAL LOW (ref 39.0–52.0)
Hemoglobin: 11.9 g/dL — ABNORMAL LOW (ref 13.0–17.0)
MCH: 28.4 pg (ref 26.0–34.0)
MCHC: 31.5 g/dL (ref 30.0–36.0)
MCV: 90.2 fL (ref 80.0–100.0)
Platelets: 113 10*3/uL — ABNORMAL LOW (ref 150–400)
RBC: 4.19 MIL/uL — ABNORMAL LOW (ref 4.22–5.81)
RDW: 13 % (ref 11.5–15.5)
WBC: 4.8 10*3/uL (ref 4.0–10.5)
nRBC: 0 % (ref 0.0–0.2)

## 2018-04-22 NOTE — Progress Notes (Signed)
Speech Language Pathology Daily Session Note  Patient Details  Name: Blakeley Margraf. MRN: [id number] Date of Birth: 1941/02/25  Today's Date: 04/22/2018 SLP Individual Time: 1030-1130 SLP Individual Time Calculation (min): 60 min  Short Term Goals: Week 1: SLP Short Term Goal 1 (Week 1): Pt will demonstrate sustained attention to function task in 2 minute intervals with mod A verbal cues. SLP Short Term Goal 2 (Week 1): Pt will express wants/needs in response to verbal questions with min A verbal cues. SLP Short Term Goal 3 (Week 1): Pt will utilize speech intelligibility strategies at phrase level with max A verbal cues to achieve 50% intelligibility. SLP Short Term Goal 4 (Week 1): Pt will demonstrate basic problem solving in functional task with Mod A verbal cues. SLP Short Term Goal 5 (Week 1): Pt will idenitify 2 cognitive and 2 physical deficits due to acute changes with Mod A verbal cues.  Skilled Therapeutic Interventions:  Skilled treatment session focused on communication goals and education. SLP received pt upright in wheelchair with sister present. Sister states that pt is doing "better than ever" from cognitive linguistic standpoint. Pt's vocal intensity continues to be appopriate (and much louder than baseline prior to admission). Pt is able to express effective strategies of increasing breath support and loudness. All questions answered to their satisfaction.      Pain Pain Assessment Pain Scale: 0-10 Pain Score: 0-No pain  Therapy/Group: Individual Therapy  Glenda Kunst 04/22/2018, 12:10 PM

## 2018-04-22 NOTE — Progress Notes (Signed)
Physical Therapy Session Note  Patient Details  Name: [name]. MRN: [id number] Date of Birth: [birth year]  Today's Date: 04/22/2018 PT Individual Time: 8099-8338 and 1430-1510 PT Individual Time Calculation (min): 42 min and 40 min  Short Term Goals: Week 1:  PT Short Term Goal 1 (Week 1): Pt will demonstrate appropriate sequencing and initiation of functional mobility 50% of the time PT Short Term Goal 2 (Week 1): Pt will ambulate 66' w/ CGA w/ LRAD PT Short Term Goal 3 (Week 1): Pt will maintain dynamic standing balance w/ min assist PT Short Term Goal 4 (Week 1): Pt will participate in 30 min of upright activity w/o increase in fatigue.   Skilled Therapeutic Interventions/Progress Updates: Tx1:  Pt presented in w/c completing breakfast and agreeable to therapy. Pt required minA for threading pants for time management and performed STS to complete donning pants. Pt donned shirt with set up with increased time for coordinating and buttoning shirt. Pt ambulated to rehab gym with RW and CGA with verbal cues for increasing BOS with limited carryover. Pt participated in toe taps to target stepping on colored dots to increase BOS. Pt was able to perform with and without AD without LOB. Pt also performed obstacle course weaving through cones with emphasis on RW management and increasing BOS. Pt then ambulated back to room and returned to w/c. Pt left with belt alarm on, call bell within reach and needs met.   Tx2: Pt presented in w/c agreeable to therapy. PTA donned shoes per pt's request total A for time management. Pt engaged in meaningful conversation for approx 5 min with pt naming people in phot book and relationship to pt. Pt then ambulated to rehab gym with verbal cues for increasing BOS. Participated in balance activities with pt performing step overs walking stick with tape marked on floor between feet to provide visual aide for increasing BOS. Pt noted to have difficulty initially  when performing STS to push LLE downward to floor. Pt was able to correct with verbal cues. Performed STS x 5 without AD for carryover and pt was able to demonstrate improved technique with no posterior lean on final attempt. Pt ambulated back to room and returned to w/c in same manner as prior. Pt left with belt alarm on, call bell within reach and needs met.      Therapy Documentation Precautions:  Precautions Precautions: Fall Restrictions Weight Bearing Restrictions: No General:   Vital Signs:  Pain: Pain Assessment Pain Scale: 0-10 Pain Score: 0-No pain Mobility:   Locomotion :    Trunk/Postural Assessment :    Balance:   Exercises:   Other Treatments:      Therapy/Group: Individual Therapy  Anton Cheramie 04/22/2018, 12:30 PM

## 2018-04-22 NOTE — Progress Notes (Signed)
Occupational Therapy Session Note  Patient Details  Name: [name]. MRN: [id number] Date of Birth: 10/23/1940  Today's Date: 04/22/2018 OT Individual Time: 1300-1350 OT Individual Time Calculation (min): 50 min    Short Term Goals: Week 1:  OT Short Term Goal 1 (Week 1): Pt will perform LB bathing sit to stand with supervision for 2 consecutive sessions.  OT Short Term Goal 2 (Week 1): Pt will perform LB dressing sit to stand with supervision and 2 consecutive sessions.  OT Short Term Goal 3 (Week 1): Pt will complete all aspects of toileting with supervision using LRAD and elevated toilet.  OT Short Term Goal 4 (Week 1): Pt will demonstrate emergent awareness of balance issues and sit down to thread LB clothing with no more than one questioning cue.   Skilled Therapeutic Interventions/Progress Updates:    Pt completed bathing and dressing sit to stand at the sink.  He needed mod instructional cueing to push up from the wheelchair with sit to stand instead of trying to pull up on the sink.  Min assist for sit to stand transitions when removing his LB clothing, washing peri area, and pulling brief and pants back up over his hips.  He needed mod instructional cueing at times to sit down to wash his lower legs and feet as he was trying to wash them standing up.  He donned pullover shirt with setup and increased time.  Nursing in room at end of session to administer meds and supervise pt for completion of donning gripper socks.    Therapy Documentation Precautions:  Precautions Precautions: Fall Restrictions Weight Bearing Restrictions: No   Pain: Pain Assessment Pain Scale: 0-10 Pain Score: 0-No pain ADL: See Care Tool Section for some details of ADL  Therapy/Group: Individual Therapy  Fahad Cisse,Hansel OTR/L 04/22/2018, 3:50 PM

## 2018-04-22 NOTE — Progress Notes (Signed)
[facility] PHYSICAL MEDICINE & REHABILITATION PROGRESS NOTE  Subjective/Complaints: Patient seen sitting up in a chair this morning.  He states he slept well overnight.  He states he had a good weekend.  ROS: Denies CP, SOB, N/V/D.  Objective: Vital Signs: Blood pressure (!) 115/54, pulse 63, temperature 98.3 F (36.8 C), temperature source Oral, resp. rate 18, height 5\' 6"  (1.676 m), weight 58.1 kg, SpO2 97 %. No results found. Recent Labs    04/22/18 0608  WBC 4.8  HGB 11.9*  HCT 37.8*  PLT 113*   Recent Labs    04/22/18 0608  CREATININE 1.13    Physical Exam: BP (!) 115/54 (BP Location: Left Arm)   Pulse 63   Temp 98.3 F (36.8 C) (Oral)   Resp 18   Ht 5\' 6"  (1.676 m)   Wt 58.1 kg   SpO2 97%   BMI 20.67 kg/m  Gen: NAD. Frail. HENT: Normocephalic and atraumatic.  Eyes: EOMI. No discharge.   Cardiovascular: RRR.  No JVD. Respiratory: Effort normal and breath sounds normal.  GI: Bowel sounds are normal. He exhibits no distension.  Musculoskeletal: No edema or tenderness in extremities  Neurological:  Alert and oriented x2 Makes good eye contact with examiner.  Follow simple commands. Motor: Grossly 4+/5 throughout, limited by bradykinesia and rigidity, unchanged Skin: Skin is warm and dry.  Psychiatric: His affect is blunt. His speech is delayed and slurred. He is slowed. Cognition and memory are impaired.   Assessment/Plan: 1. Functional deficits secondary to Parkinson's disease which require 3+ hours per day of interdisciplinary therapy in a comprehensive inpatient rehab setting.  Physiatrist is providing close team supervision and 24 hour management of active medical problems listed below.  Physiatrist and rehab team continue to assess barriers to discharge/monitor patient progress toward functional and medical goals  Care Tool:  Bathing    Body parts bathed by patient: Right arm, Left arm, Chest, Abdomen, Front perineal area, Buttocks, Right upper  leg, Left upper leg, Right lower leg, Left lower leg, Face         Bathing assist Assist Level: Minimal Assistance - Patient > 75%     Upper Body Dressing/Undressing Upper body dressing   What is the patient wearing?: Button up shirt    Upper body assist Assist Level: Minimal Assistance - Patient > 75%    Lower Body Dressing/Undressing Lower body dressing      What is the patient wearing?: Underwear/pull up, Pants     Lower body assist Assist for lower body dressing: Minimal Assistance - Patient > 75%     Toileting Toileting    Toileting assist Assist for toileting: Moderate Assistance - Patient 50 - 74%     Transfers Chair/bed transfer  Transfers assist     Chair/bed transfer assist level: Moderate Assistance - Patient 50 - 74%     Locomotion Ambulation   Ambulation assist      Assist level: Minimal Assistance - Patient > 75% Assistive device: Walker-rolling Max distance: 120   Walk 10 feet activity   Assist     Assist level: Minimal Assistance - Patient > 75% Assistive device: Walker-rolling   Walk 50 feet activity   Assist    Assist level: Minimal Assistance - Patient > 75% Assistive device: Walker-rolling    Walk 150 feet activity   Assist Walk 150 feet activity did not occur: Safety/medical concerns  Assist level: Minimal Assistance - Patient > 75% Assistive device: Walker-rolling    Walk 10  feet on uneven surface  activity   Assist Walk 10 feet on uneven surfaces activity did not occur: Safety/medical concerns         Wheelchair     Assist   Type of Wheelchair: Manual    Wheelchair assist level: Supervision/Verbal cueing Max wheelchair distance: 150    Wheelchair 50 feet with 2 turns activity    Assist        Assist Level: Supervision/Verbal cueing   Wheelchair 150 feet activity     Assist Wheelchair 150 feet activity did not occur: Safety/medical concerns   Assist Level: Supervision/Verbal  cueing      Medical Problem List and Plan: 1.  Debility secondary to  Parkinson's disease/multi-medical  Continue CIR  2.  DVT Prophylaxis/Anticoagulation: Subcutaneous Lovenox  3. Pain Management:  Tylenol as needed 4. Mood:  Klonopin 0.5 mg daily at bedtime, Aricept 10 mg daily at bedtime  Zoloft 12.5 mg daily d/ced on 12/4 per daughter (plan was to wean) 5. Neuropsych: This patient is not capable of making decisions on his own behalf. 6. Skin/Wound Care:  Routine skin checks 7. Fluids/Electrolytes/Nutrition:  Routine in and out's   BMP within acceptable range on 12/6, creatinine within normal range on 12/9  Encourage fluids 8. Parkinson's disease. Sinemet 2 tablets 4 times a day. Follow-up outpatient neurology Dr. Skipper Cliche 9.  History of aortic stenosis. Status post TAVR procedure. Continue aspirin 10. Constipation. Laxative assistance 11.Overactive bladder. Myrbetriq 12. ABLA  Hemoglobin 11.9 on 12/9  Continue to monitor 13.  Thrombocytopenia  Platelets 113 on 12/9  Continue to monitor 14.? Sleep disturbance  Sleep chart  LOS: 7 days A FACE TO FACE EVALUATION WAS PERFORMED   Lorie Phenix 04/22/2018, 9:41 AM

## 2018-04-23 ENCOUNTER — Inpatient Hospital Stay (HOSPITAL_COMMUNITY): Payer: Medicare Other | Admitting: Physical Therapy

## 2018-04-23 ENCOUNTER — Inpatient Hospital Stay (HOSPITAL_COMMUNITY): Payer: Medicare Other | Admitting: Occupational Therapy

## 2018-04-23 ENCOUNTER — Inpatient Hospital Stay (HOSPITAL_COMMUNITY): Payer: Medicare Other | Admitting: Speech Pathology

## 2018-04-23 DIAGNOSIS — R0989 Other specified symptoms and signs involving the circulatory and respiratory systems: Secondary | ICD-10-CM

## 2018-04-23 NOTE — Progress Notes (Signed)
Physical Therapy Session Note  Patient Details  Name: [name]. MRN: [id number] Date of Birth: Mar 31, 1941  Today's Date: 04/23/2018 PT Individual Time: 1530-1600 PT Individual Time Calculation (min): 30 min   Short Term Goals: Week 1:  PT Short Term Goal 1 (Week 1): Pt will demonstrate appropriate sequencing and initiation of functional mobility 50% of the time PT Short Term Goal 2 (Week 1): Pt will ambulate 42' w/ CGA w/ LRAD PT Short Term Goal 3 (Week 1): Pt will maintain dynamic standing balance w/ min assist PT Short Term Goal 4 (Week 1): Pt will participate in 30 min of upright activity w/o increase in fatigue.   Skilled Therapeutic Interventions/Progress Updates:   Pt in w/c and agreeable to therapy, no c/o pain. Total assist w/c transport to/from therapy gym. Worked on standing balance w/o UE support and postural control while performing clothespins and card matching tasks, emphasized increasing thoracic extension. Min guard for safety, no LOB and pt using appropriate ankle weight shifting strategies for balance. Returned to room in w/c and ambulated to toilet w/ CGA, supervision for LE garment management and verbal cues to utilize grab bars for support to maintain balance. Ended session toileting and in care of NT, all needs met.   Therapy Documentation Precautions:  Precautions Precautions: Fall Restrictions Weight Bearing Restrictions: No Pain: Pain Assessment Pain Scale: 0-10 Pain Score: 0-No pain  Therapy/Group: Individual Therapy  Silvana Holecek Clent Demark 04/23/2018, 3:59 PM

## 2018-04-23 NOTE — Progress Notes (Signed)
Physical Therapy Session Note  Patient Details  Name: [name]. MRN: [id number] Date of Birth: May 17, 1940  Today's Date: 04/23/2018 PT Individual Time: 1303-1400 PT Individual Time Calculation (min): 57 min   Short Term Goals: Week 1:  PT Short Term Goal 1 (Week 1): Pt will demonstrate appropriate sequencing and initiation of functional mobility 50% of the time PT Short Term Goal 2 (Week 1): Pt will ambulate 33' w/ CGA w/ LRAD PT Short Term Goal 3 (Week 1): Pt will maintain dynamic standing balance w/ min assist PT Short Term Goal 4 (Week 1): Pt will participate in 30 min of upright activity w/o increase in fatigue.   Skilled Therapeutic Interventions/Progress Updates: Pt presented in w/c agreeable to therapy with sister present. Pt participated in ambulation in room with RW with PTA observing pt managing RW. Pt was able to safely turn in room, walk between bed and wall to bathroom and turn around in bathroom with CGA and safe management of RW. Pt then ambulated to rehab gym CGA with RW and participated in STS without AD for BLE strengthening and toe taps to target on 6in step for coordination and emphasis on increasing BOS and encouraging larger movement. Pt participated in ascending/descending x 8 steps per sister's request as entry to sister's home (next door) requires x 4 steps with B rails that are accessible. Pt was able to complete with CGA and initially moderate verbal cues for sequencing. Pt was able to complete final 4 steps with improved carryover and CGA. Pt participated in weaving through cones x 10 with pt demonstrating good safety with RW and safety in negotiating cones without vc's at [place]. Pt continued performing STS throughout session with vc's for hand placement and decreased posterior lean noted with repetition. Pt ambulated back to room with RW CGA and returned to w/c. Pt remained in w/c at end of session with belt alarm on, call bell within reach and current needs met.        Therapy Documentation Precautions:  Precautions Precautions: Fall Restrictions Weight Bearing Restrictions: No General:   Vital Signs:  Pain: Pain Assessment Pain Scale: 0-10 Pain Score: 0-No pain    Therapy/Group: Individual Therapy  Deion Swift  Tongela Encinas, PTA  04/23/2018, 3:49 PM

## 2018-04-23 NOTE — Progress Notes (Signed)
Speech Language Pathology Discharge Summary  Patient Details  Name: [name]. MRN: [id number] Date of Birth: 1940-05-28  Today's Date: 04/23/2018 SLP Individual Time: 0094-1791 SLP Individual Time Calculation (min): 45 min   Skilled Therapeutic Interventions:  Pt was seen for skilled ST targeting cognitive goals.  Upon arrival, pt was sitting in his wheelchair, awake, alert, and agreeable to participating in [place].  SLP facilitated the session with a novel card game to address functional problem solving goals and attention goals.  Pt completed task with overall min assist for working memory of task rules and procedures.  He sustained his attention to task for its duration (~20  Minutes) with no cues needed for redirection.  Pt was returned to room at the end of today's therapy session.  Per report from pt's primary PTA and following multiple discussions with pt's family, all feel that pt is returned to his cognitive-linguistic baseline.  As a result, no further ST needs are indicated at this time.  Pt was returned to room and left in wheelchair with chair alarm set and all needs within reach.      Patient has met 4 of 4 long term goals.  Patient to discharge at [facility] level.  Reasons goals not met:     Clinical Impression/Discharge Summary:   Pt has made excellent gains while inpatient and is discharging from [facility] services having met 4 out of 4 long term goals.  Pt is currently min assist for tasks due to mild baseline cognitive deficits.  Pt/family education is complete at this time.  Pt has demonstrated improved attention to tasks, functional problem solving, orientation, and speech intelligibility.  Pt will be discharging home with recommendations for ST follow up at next level of care for further Parkinson's management but no additional acute ST needs are identified at this time as pt, family, and therapy team feels that pt is returned to baseline for cognition.    Care Partner:   Caregiver Able to Provide Assistance: Yes  Type of Caregiver Assistance: Physical;Cognitive  Recommendation:  Outpatient SLP;24 hour supervision/assistance  Rationale for SLP Follow Up: Maximize functional communication;Maximize cognitive function and independence(for Parkinson's Management )   Equipment: none recommended by SLP    Reasons for discharge: Treatment goals met   Patient/Family Agrees with Progress Made and Goals Achieved: Yes    [name], Selinda Orion 04/23/2018, 4:40 PM

## 2018-04-23 NOTE — Plan of Care (Signed)
  Problem: RH SAFETY Goal: RH STG ADHERE TO SAFETY PRECAUTIONS W/ASSISTANCE/DEVICE Description STG Adhere to Safety Precautions With Assistance/Device. Mod assist  Outcome: Progressing  Call  light within reach, proper footwear, chair/bed alarm

## 2018-04-23 NOTE — Progress Notes (Signed)
Physical Therapy Weekly Progress Note  Patient Details  Name: [name]. MRN: [id number] Date of Birth: 1940-08-07  Beginning of progress report period: April 16, 2018 End of progress report period: April 23, 2018  Patient has met 4 of 4 short term goals.  Pt has demonstrated overall improvement during current week of therapy. Pt is now consistently CGA ambulation with RW and minA without AD. Pt demonstrates improved safety with transfers and overall improved endurance with functional mobility.   Patient continues to demonstrate the following deficits muscle weakness, impaired timing and sequencing, unbalanced muscle activation, decreased coordination and decreased motor planning and decreased initiation, decreased attention and decreased awareness and therefore will continue to benefit from skilled PT intervention to increase functional independence with mobility.  Patient progressing toward long term goals..  Continue plan of care.  PT Short Term Goals Week 1:  PT Short Term Goal 1 (Week 1): Pt will demonstrate appropriate sequencing and initiation of functional mobility 50% of the time PT Short Term Goal 1 - Progress (Week 1): Met PT Short Term Goal 2 (Week 1): Pt will ambulate 65' w/ CGA w/ LRAD PT Short Term Goal 2 - Progress (Week 1): Met PT Short Term Goal 3 (Week 1): Pt will maintain dynamic standing balance w/ min assist PT Short Term Goal 3 - Progress (Week 1): Met PT Short Term Goal 4 (Week 1): Pt will participate in 30 min of upright activity w/o increase in fatigue.  PT Short Term Goal 4 - Progress (Week 1): Met Week 2:  PT Short Term Goal 1 (Week 2): =LTGs due to ELOS  [name]  [name], PTA   Burnard Bunting, PT, DPT  04/23/2018, 4:15 PM

## 2018-04-23 NOTE — Progress Notes (Signed)
[place] PHYSICAL MEDICINE & REHABILITATION PROGRESS NOTE  Subjective/Complaints: Patient seen laying in bed this morning he slept fairly overnight per sleep chart.  Patient denies complaints this morning.  No reported issues overnight.  ROS: Denies CP, SOB, N/V/D.  Objective: Vital Signs: Blood pressure (!) 154/79, pulse 67, temperature 98 F (36.7 C), temperature source Oral, resp. rate 18, height 5\' 6"  (1.676 m), weight 58.3 kg, SpO2 100 %. No results found. Recent Labs    04/22/18 0608  WBC 4.8  HGB 11.9*  HCT 37.8*  PLT 113*   Recent Labs    04/22/18 0608  CREATININE 1.13    Physical Exam: BP (!) 154/79 (BP Location: Right Arm)   Pulse 67   Temp 98 F (36.7 C) (Oral)   Resp 18   Ht 5\' 6"  (1.676 m)   Wt 58.3 kg   SpO2 100%   BMI 20.74 kg/m  Gen: NAD. Frail. HENT: Normocephalic and atraumatic.  Eyes: EOMI. No discharge.   Cardiovascular: RRR.  No JVD. Respiratory: Effort normal and breath sounds normal.  GI: Bowel sounds are normal. He exhibits no distension.  Musculoskeletal: No edema or tenderness in extremities  Neurological:  Alert and oriented x2 with cues Makes good eye contact with examiner.  Follow simple commands. Motor: Grossly 4+/5 throughout, limited by bradykinesia and rigidity, stable Skin: Skin is warm and dry.  Psychiatric: His affect is blunt. His speech is delayed and slurred. He is slowed. Cognition and memory are impaired.   Assessment/Plan: 1. Functional deficits secondary to Parkinson's disease which require 3+ hours per day of interdisciplinary therapy in a comprehensive inpatient rehab setting.  Physiatrist is providing close team supervision and 24 hour management of active medical problems listed below.  Physiatrist and rehab team continue to assess barriers to discharge/monitor patient progress toward functional and medical goals  Care Tool:  Bathing    Body parts bathed by patient: Right arm, Left arm, Chest, Abdomen,  Front perineal area, Buttocks, Right upper leg, Left upper leg, Right lower leg, Left lower leg, Face         Bathing assist Assist Level: Minimal Assistance - Patient > 75%     Upper Body Dressing/Undressing Upper body dressing   What is the patient wearing?: Pull over shirt    Upper body assist Assist Level: Set up assist    Lower Body Dressing/Undressing Lower body dressing      What is the patient wearing?: Pants, Incontinence brief     Lower body assist Assist for lower body dressing: Minimal Assistance - Patient > 75%     Toileting Toileting    Toileting assist Assist for toileting: Moderate Assistance - Patient 50 - 74%     Transfers Chair/bed transfer  Transfers assist     Chair/bed transfer assist level: Moderate Assistance - Patient 50 - 74%     Locomotion Ambulation   Ambulation assist      Assist level: Minimal Assistance - Patient > 75% Assistive device: Walker-rolling Max distance: 120   Walk 10 feet activity   Assist     Assist level: Minimal Assistance - Patient > 75% Assistive device: Walker-rolling   Walk 50 feet activity   Assist    Assist level: Minimal Assistance - Patient > 75% Assistive device: Walker-rolling    Walk 150 feet activity   Assist Walk 150 feet activity did not occur: Safety/medical concerns  Assist level: Minimal Assistance - Patient > 75% Assistive device: Walker-rolling    Walk 10  feet on uneven surface  activity   Assist Walk 10 feet on uneven surfaces activity did not occur: Safety/medical concerns         Wheelchair     Assist   Type of Wheelchair: Manual    Wheelchair assist level: Supervision/Verbal cueing Max wheelchair distance: 150    Wheelchair 50 feet with 2 turns activity    Assist        Assist Level: Supervision/Verbal cueing   Wheelchair 150 feet activity     Assist Wheelchair 150 feet activity did not occur: Safety/medical concerns   Assist  Level: Supervision/Verbal cueing      Medical Problem List and Plan: 1.  Debility secondary to  Parkinson's disease/multi-medical  Continue CIR  2.  DVT Prophylaxis/Anticoagulation: Subcutaneous Lovenox  3. Pain Management:  Tylenol as needed 4. Mood:  Klonopin 0.5 mg daily at bedtime, Aricept 10 mg daily at bedtime  Zoloft 12.5 mg daily d/ced on 12/4 per daughter (plan was to wean) 5. Neuropsych: This patient is not capable of making decisions on his own behalf. 6. Skin/Wound Care:  Routine skin checks 7. Fluids/Electrolytes/Nutrition:  Routine in and out's   BMP within acceptable range on 12/6, creatinine within normal range on 12/9  Encourage fluids   Good nutritional intake 8. Parkinson's disease. Sinemet 2 tablets 4 times a day. Follow-up outpatient neurology Dr. Skipper Cliche 9.  History of aortic stenosis. Status post TAVR procedure. Continue aspirin 10. Constipation. Laxative assistance 11.Overactive bladder. Myrbetriq 12. ABLA  Hemoglobin 11.9 on 12/9  Continue to monitor 13.  Thrombocytopenia  Platelets 113 on 12/9  Continue to monitor 14.? Sleep disturbance  Sleep chart 15.  Labile blood pressure  Labile on 12/10  LOS: 8 days A FACE TO FACE EVALUATION WAS PERFORMED  Philamena Kramar Lorie Phenix 04/23/2018, 7:29 AM

## 2018-04-23 NOTE — Progress Notes (Signed)
Occupational Therapy Session Note  Patient Details  Name: [name]. MRN: [id number] Date of Birth: 1941-05-09  Today's Date: 04/23/2018 OT Individual Time: 0900-1001 OT Individual Time Calculation (min): 61 min    Short Term Goals: Week 1:  OT Short Term Goal 1 (Week 1): Pt will perform LB bathing sit to stand with supervision for 2 consecutive sessions.  OT Short Term Goal 2 (Week 1): Pt will perform LB dressing sit to stand with supervision and 2 consecutive sessions.  OT Short Term Goal 3 (Week 1): Pt will complete all aspects of toileting with supervision using LRAD and elevated toilet.  OT Short Term Goal 4 (Week 1): Pt will demonstrate emergent awareness of balance issues and sit down to thread LB clothing with no more than one questioning cue.   Skilled Therapeutic Interventions/Progress Updates:    Pt completed bathing and dressing during session.  He ambulated to the shower with min assist without use of an assistive device.  Once in the shower he was able to remove all clothing with min assist and then complete toilet transfer with min assist level to the toilet to stand and urinate.  Once this was completed, he transferred back to the shower and performed bathing task with increased time.  Mod instructional cueing to sit down to wash his feet and his UB.  He transitioned out to the wheelchair at the sink for dressing.  Min assist for buttoning buttons on his shirt.  Min guard for all LB dressing with mod instructional cueing to push up from the wheelchair and not try and pull up on the sink.  He completed session with combing of his hair in sitting.  Pt left in the wheelchair with call button and phone in reach and safety alarm belt in place.    Therapy Documentation Precautions:  Precautions Precautions: Fall Restrictions Weight Bearing Restrictions: No  Pain: Pain Assessment Pain Score: 0-No pain ADL: See Care Tool Section for some ADL details  Therapy/Group:  Individual Therapy  Rabecca Birge,[place] OTR/L 04/23/2018, 12:21 PM

## 2018-04-23 NOTE — Progress Notes (Signed)
Occupational Therapy Session Note  Patient Details  Name: [name]. MRN: [id number] Date of Birth: [birth year]  Today's Date: 04/23/2018 OT Individual Time: 1015-1100 OT Individual Time Calculation (min): 45 min    Short Term Goals: Week 1:  OT Short Term Goal 1 (Week 1): Pt will perform LB bathing sit to stand with supervision for 2 consecutive sessions.  OT Short Term Goal 2 (Week 1): Pt will perform LB dressing sit to stand with supervision and 2 consecutive sessions.  OT Short Term Goal 3 (Week 1): Pt will complete all aspects of toileting with supervision using LRAD and elevated toilet.  OT Short Term Goal 4 (Week 1): Pt will demonstrate emergent awareness of balance issues and sit down to thread LB clothing with no more than one questioning cue.   Skilled Therapeutic Interventions/Progress Updates:    Patient seated in w/c, denies pain and happy to participate in therapy.  After walking in the hallway he states "that was soothing" Functional transfers and ambulation with RW CS/CG, cues to maintain appropriate distance from the walker. Completed unsupported sitting core, upper body and coordination exercises - rhythmic activities are difficult but he responds to strategies and improved with repetition.  Patient returned to room, seated in w/c, alarm set.  Sister arrived at close of session and patient able to recall session details for her.   Therapy Documentation Precautions:  Precautions Precautions: Fall Restrictions Weight Bearing Restrictions: No General:   Vital Signs:  Pain: Pain Assessment Pain Scale: 0-10 Pain Score: 0-No pain   Therapy/Group: Individual Therapy  Carlos Levering 04/23/2018, 12:32 PM

## 2018-04-24 ENCOUNTER — Inpatient Hospital Stay (HOSPITAL_COMMUNITY): Payer: Medicare Other | Admitting: Physical Therapy

## 2018-04-24 ENCOUNTER — Inpatient Hospital Stay (HOSPITAL_COMMUNITY): Payer: Medicare Other | Admitting: Occupational Therapy

## 2018-04-24 NOTE — Progress Notes (Signed)
[place] PHYSICAL MEDICINE & REHABILITATION PROGRESS NOTE  Subjective/Complaints: Up with NT. No new issues. In good spirits  ROS: Patient denies fever, rash, sore throat, blurred vision, nausea, vomiting, diarrhea, cough, shortness of breath or chest pain, joint or back pain, headache, or mood change.    Objective: Vital Signs: Blood pressure (!) 141/79, pulse 61, temperature 98.7 F (37.1 C), resp. rate 17, height 5\' 6"  (1.676 m), weight 58.3 kg, SpO2 100 %. No results found. Recent Labs    04/22/18 0608  WBC 4.8  HGB 11.9*  HCT 37.8*  PLT 113*   Recent Labs    04/22/18 0608  CREATININE 1.13    Physical Exam: BP (!) 141/79 (BP Location: Left Arm)   Pulse 61   Temp 98.7 F (37.1 C)   Resp 17   Ht 5\' 6"  (1.676 m)   Wt 58.3 kg   SpO2 100%   BMI 20.74 kg/m  Constitutional: No distress . Vital signs reviewed. HEENT: EOMI, oral membranes moist Neck: supple Cardiovascular: RRR without murmur. No JVD    Respiratory: CTA Bilaterally without wheezes or rales. Normal effort    GI: BS +, non-tender, non-distended   Musculoskeletal: No edema or tenderness in extremities  Neurological:  Alert and oriented x2 with cues Makes good eye contact with examiner.  Follow simple commands. Motor: Grossly 4+/5 throughout, ongoing bradykinesia and rigidity  Skin: Skin is warm and dry.  Psychiatric: pleasant and cooperative  Assessment/Plan: 1. Functional deficits secondary to Parkinson's disease which require 3+ hours per day of interdisciplinary therapy in a comprehensive inpatient rehab setting.  Physiatrist is providing close team supervision and 24 hour management of active medical problems listed below.  Physiatrist and rehab team continue to assess barriers to discharge/monitor patient progress toward functional and medical goals  Care Tool:  Bathing    Body parts bathed by patient: Right arm, Left arm, Chest, Abdomen, Front perineal area, Buttocks, Right upper leg,  Left upper leg, Right lower leg, Left lower leg, Face         Bathing assist Assist Level: Minimal Assistance - Patient > 75%     Upper Body Dressing/Undressing Upper body dressing   What is the patient wearing?: Button up shirt    Upper body assist Assist Level: Minimal Assistance - Patient > 75%    Lower Body Dressing/Undressing Lower body dressing      What is the patient wearing?: Incontinence brief, Pants     Lower body assist Assist for lower body dressing: Minimal Assistance - Patient > 75%     Toileting Toileting    Toileting assist Assist for toileting: Moderate Assistance - Patient 50 - 74%     Transfers Chair/bed transfer  Transfers assist     Chair/bed transfer assist level: Moderate Assistance - Patient 50 - 74%     Locomotion Ambulation   Ambulation assist      Assist level: Contact Guard/Touching assist Assistive device: Walker-rolling Max distance: 15'   Walk 10 feet activity   Assist     Assist level: Contact Guard/Touching assist Assistive device: Walker-rolling   Walk 50 feet activity   Assist    Assist level: Contact Guard/Touching assist Assistive device: Walker-rolling    Walk 150 feet activity   Assist Walk 150 feet activity did not occur: Safety/medical concerns  Assist level: Minimal Assistance - Patient > 75% Assistive device: Walker-rolling    Walk 10 feet on uneven surface  activity   Assist Walk 10 feet on uneven  surfaces activity did not occur: Safety/medical concerns         Wheelchair     Assist   Type of Wheelchair: Manual    Wheelchair assist level: Supervision/Verbal cueing Max wheelchair distance: 150    Wheelchair 50 feet with 2 turns activity    Assist        Assist Level: Supervision/Verbal cueing   Wheelchair 150 feet activity     Assist Wheelchair 150 feet activity did not occur: Safety/medical concerns   Assist Level: Supervision/Verbal cueing       Medical Problem List and Plan: 1.  Debility secondary to  Parkinson's disease/multi-medical  Continue CIR  2.  DVT Prophylaxis/Anticoagulation: Subcutaneous Lovenox  3. Pain Management:  Tylenol as needed 4. Mood:  Klonopin 0.5 mg daily at bedtime, Aricept 10 mg daily at bedtime  Zoloft 12.5 mg daily d/ced on 12/4 per daughter (plan was to wean) 5. Neuropsych: This patient is not capable of making decisions on his own behalf. 6. Skin/Wound Care:  Routine skin checks 7. Fluids/Electrolytes/Nutrition:  Routine in and out's   BMP within acceptable range on 12/6, creatinine within normal range on 12/9  Encourage fluids   Intake solid 8. Parkinson's disease. Sinemet 2 tablets 4 times a day. Follow-up outpatient neurology Dr. Skipper Cliche 9.  History of aortic stenosis. Status post TAVR procedure. Continue aspirin 10. Constipation. Laxative assistance 11.Overactive bladder. Myrbetriq 12. ABLA  Hemoglobin 11.9 on 12/9  Continue to monitor 13.  Thrombocytopenia  Platelets 113 on 12/9  Continue to monitor 14.? Sleep disturbance  Continue Sleep chart 15.  Labile blood pressure  Improved control 12/11  LOS: 9 days A FACE TO [place] 04/24/2018, 8:20 AM

## 2018-04-24 NOTE — Progress Notes (Signed)
Occupational Therapy Weekly Progress Note  Patient Details  Name: Reo Portela. MRN: [id number] Date of Birth: 1940-11-01  Beginning of progress report period: April 16, 2018 End of progress report period: April 24, 2018  Today's Date: 04/24/2018 OT Individual Time: 1434-1530 OT Individual Time Calculation (min): 56 min    Patient has met 1 of 4 short term goals.  Mr. Flinn is making steady progress with OT at this time.  He is able to complete all selfcare tasks sit to stand at a min guard assist level.  He continues to demonstrate decreased dynamic standing balance, with occasional LOB posteriorly with initial standing.  Mod instructional cueing is needed for pt to recall the need to sit down when attempting to remove LB clothing from his feet or when donning it.  He is able to complete functional transfers to the toilet and the walk-in shower at a min guard to min assist level without assistive device.  Short distance functional mobility with use of the RW is at a min guard assist as well.  He demonstrates awareness of situation but still needs cueing for anticipatory awareness related to his balance issues.  Feel he is on target to meet supervision level goals with anticipated discharge of 12/17.  Will continue with current OT POC at this time.    Patient continues to demonstrate the following deficits: muscle weakness and decreased sitting balance, decreased standing balance and decreased balance strategies and therefore will continue to benefit from skilled OT intervention to enhance overall performance with BADL and Reduce care partner burden.  Patient progressing toward long term goals..  Continue plan of care.  OT Short Term Goals Week 2:  OT Short Term Goal 1 (Week 2): Continue working on established LTGs set at supervision level.   Skilled Therapeutic Interventions/Progress Updates:    Pt worked on dynamic standing balance during session.  He was able to use the RW  for support to ambulate down to the day room with min guard assist.  He then worked on [employer] for dynamic standing balance.  He was able to toss, ambulate short distance, and pick up bean bags from the floor with min guard assist.  Loss of balance to the right X 2 when tossing the bean bags with staggered stance.  Finished session with functional mobility back to the room with pt left in room with call button and phone in reach and chair alarm in place.    Therapy Documentation Precautions:  Precautions Precautions: Fall Restrictions Weight Bearing Restrictions: No  Pain: Pain Assessment Pain Score: 0-No pain  Therapy/Group: Individual Therapy  Braxton Weisbecker,[place] OTR/L 04/24/2018, 4:50 PM

## 2018-04-24 NOTE — Progress Notes (Signed)
Physical Therapy Session Note  Patient Details  Name: [name]. MRN: [id number] Date of Birth: [birth year]  Today's Date: 04/24/2018 PT Individual Time: 0800-0915 PT Individual Time Calculation (min): 75 min   Short Term Goals: Week 2:  PT Short Term Goal 1 (Week 2): =LTGs due to ELOS  Skilled Therapeutic Interventions/Progress Updates:    Pt received seated in WC and agreeable to PT. Min assist donning pants due to LE ROM and fine motor deficits.  Pt CGA for donning socks and min A for donning shoes. Initial sit to stand with RW min assist to prevent posterior LOB.  Self propelled WC 188f to rehab gym with supervision assist for managing obstacles and attention to task.  Nustep 2 bouts of 3 min each. Verbal cues to maintain eccentric control during exercise. Stand pivot with RW to nustep with CGA and verbal cues for LE sequencing.  Gait training 150 ft with RW CGA overall with some instances requiring min A to prevent LOB, particularly when pt is turning. Verbal cues to keep RW closer to body during gait and cues for LE sequencing when turning.  Sit to stand x10 with RW. Progressed to STS without AD while holding ball x10 and x15. Min assist for first two reps then CGA for remainder of exercise. Verbal cues for anterior weight shift when transitioning from sitting to standing. Pt demonstrates improved carryover from previous sessions.  Standing balance on red wedge x2. Pt placed and returned green/yellow/blue clothespins to rack while standing. Min A to step onto wedge then CGA throughout. Pt demonstrates improved carryover from previous sessions.  Ended session with pt seated in WC, call bell within reach, and all needs met.  Therapy Documentation Precautions:  Precautions Precautions: Fall Restrictions Weight Bearing Restrictions: No  Pain: Pain Assessment Pain Scale: 0-10 Pain Score: 0-No pain   Therapy/Group: Individual Therapy  AAmador Cunas12/03/2018,  12:45 PM

## 2018-04-24 NOTE — Progress Notes (Signed)
Physical Therapy Session Note  Patient Details  Name: Ramirez Fullbright. MRN: [id number] Date of Birth: 10/07/1940  Today's Date: 04/24/2018 PT Individual Time: 1303-1400 PT Individual Time Calculation (min): 57 min   Short Term Goals: Week 2:  PT Short Term Goal 1 (Week 2): =LTGs due to ELOS  Skilled Therapeutic Interventions/Progress Updates: Pt presented in w/c completing lunch and agreeable to therapy. Pt performed STS from w/c minA due to increased posterior lean. Pt noted to have episode of bowl incontinence and ambulated to bathroom. Pt able to doff pants with at toilet with CGA and increased time. Performed toilet transfer CGA (+BM). Pt initiated peri-care however required minA for completion. Pt ambulated to sit and performed hand hygiene with use of BUE. Pt then ambulated to rehab gym CGA with RW and participated in forward step ups to 6in step x 5bilaterally in parallel bars. Pt given target on step to place foot to increase step length. Pt also participated in side steps and backwards walking in parallel bars with verbal cues for increasing step length. Pt ambulated back to room in same manner as prior and returned to w/c. Pt left with belt alarm on, call bell within reach and needs met.      Therapy Documentation Precautions:  Precautions Precautions: Fall Restrictions Weight Bearing Restrictions: No General:   Vital Signs: Therapy Vitals Temp: 97.6 F (36.4 C) Temp Source: Oral Pulse Rate: (!) 54 Resp: 17 BP: (!) 106/51 Patient Position (if appropriate): Sitting Oxygen Therapy SpO2: 100 % O2 Device: Room Air   Therapy/Group: Individual Therapy  Samanthajo Payano  Nathanie Ottley, PTA  04/24/2018, 3:57 PM

## 2018-04-25 ENCOUNTER — Inpatient Hospital Stay (HOSPITAL_COMMUNITY): Payer: Medicare Other | Admitting: Physical Therapy

## 2018-04-25 ENCOUNTER — Inpatient Hospital Stay (HOSPITAL_COMMUNITY): Payer: Medicare Other | Admitting: Occupational Therapy

## 2018-04-25 NOTE — Progress Notes (Signed)
[facility] PHYSICAL MEDICINE & REHABILITATION PROGRESS NOTE  Subjective/Complaints: Up in chair. Just finished breakfast. Feels well. Denies pain  ROS: Patient denies fever, rash, sore throat, blurred vision, nausea, vomiting, diarrhea, cough, shortness of breath or chest pain, joint or back pain, headache, or mood change.    Objective: Vital Signs: Blood pressure (!) 144/62, pulse 76, temperature (!) 97.5 F (36.4 C), resp. rate 16, height 5\' 6"  (1.676 m), weight 58.3 kg, SpO2 100 %. No results found. No results for input(s): WBC, HGB, HCT, PLT in the last 72 hours. No results for input(s): NA, K, CL, CO2, GLUCOSE, BUN, CREATININE, CALCIUM in the last 72 hours.  Physical Exam: BP (!) 144/62 (BP Location: Left Arm)   Pulse 76   Temp (!) 97.5 F (36.4 C)   Resp 16   Ht 5\' 6"  (1.676 m)   Wt 58.3 kg   SpO2 100%   BMI 20.74 kg/m  Constitutional: No distress . Vital signs reviewed. HEENT: EOMI, oral membranes moist Neck: supple Cardiovascular: RRR without murmur. No JVD    Respiratory: CTA Bilaterally without wheezes or rales. Normal effort    GI: BS +, non-tender, non-distended  Musculoskeletal: No edema or tenderness in extremities  Neurological:  Alert and oriented x2 with cues Makes good eye contact with examiner.  Follow simple commands. Motor: Grossly 4+/5 throughout, ongoing bradykinesia and rigidity unchanged  Skin: Skin is warm and dry.  Psychiatric: pleasant and cooperative  Assessment/Plan: 1. Functional deficits secondary to Parkinson's disease which require 3+ hours per day of interdisciplinary therapy in a comprehensive inpatient rehab setting.  Physiatrist is providing close team supervision and 24 hour management of active medical problems listed below.  Physiatrist and rehab team continue to assess barriers to discharge/monitor patient progress toward functional and medical goals  Care Tool:  Bathing    Body parts bathed by patient: Right arm, Left  arm, Chest, Abdomen, Front perineal area, Buttocks, Right upper leg, Left upper leg, Right lower leg, Left lower leg, Face         Bathing assist Assist Level: Minimal Assistance - Patient > 75%     Upper Body Dressing/Undressing Upper body dressing   What is the patient wearing?: Button up shirt    Upper body assist Assist Level: Minimal Assistance - Patient > 75%    Lower Body Dressing/Undressing Lower body dressing      What is the patient wearing?: Incontinence brief, Pants     Lower body assist Assist for lower body dressing: Minimal Assistance - Patient > 75%     Toileting Toileting    Toileting assist Assist for toileting: Moderate Assistance - Patient 50 - 74%     Transfers Chair/bed transfer  Transfers assist     Chair/bed transfer assist level: Moderate Assistance - Patient 50 - 74%     Locomotion Ambulation   Ambulation assist      Assist level: Contact Guard/Touching assist Assistive device: Walker-rolling Max distance: 200'   Walk 10 feet activity   Assist     Assist level: Contact Guard/Touching assist Assistive device: Walker-rolling   Walk 50 feet activity   Assist    Assist level: Contact Guard/Touching assist Assistive device: Walker-rolling    Walk 150 feet activity   Assist Walk 150 feet activity did not occur: Safety/medical concerns  Assist level: Contact Guard/Touching assist Assistive device: Walker-rolling    Walk 10 feet on uneven surface  activity   Assist Walk 10 feet on uneven surfaces activity did  not occur: Safety/medical concerns         Wheelchair     Assist   Type of Wheelchair: Manual    Wheelchair assist level: Supervision/Verbal cueing Max wheelchair distance: 150    Wheelchair 50 feet with 2 turns activity    Assist        Assist Level: Supervision/Verbal cueing   Wheelchair 150 feet activity     Assist Wheelchair 150 feet activity did not occur: Safety/medical  concerns   Assist Level: Supervision/Verbal cueing      Medical Problem List and Plan: 1.  Debility secondary to  Parkinson's disease/multi-medical  Continue CIR  2.  DVT Prophylaxis/Anticoagulation: Subcutaneous Lovenox  3. Pain Management:  Tylenol as needed 4. Mood:  Klonopin 0.5 mg daily at bedtime, Aricept 10 mg daily at bedtime  Zoloft 12.5 mg daily d/ced on 12/4 per daughter (plan was to wean) 5. Neuropsych: This patient is not capable of making decisions on his own behalf. 6. Skin/Wound Care:  Routine skin checks 7. Fluids/Electrolytes/Nutrition:  Routine in and out's   BMP within acceptable range on 12/6, creatinine within normal range on 12/9  Encourage fluids   Intake consistent 8. Parkinson's disease. Sinemet 2 tablets 4 times a day. Follow-up outpatient neurology Dr. Skipper Cliche 9.  History of aortic stenosis. Status post TAVR procedure. Continue aspirin 10. Constipation. Laxative assistance 11.Overactive bladder. Myrbetriq 12. ABLA  Hemoglobin 11.9 on 12/9  Continue to monitor 13.  Thrombocytopenia  Platelets 113 on 12/9  Continue to monitor 14.? Sleep disturbance  Continue Sleep chart 15.  Labile blood pressure  Improved control 12/12  LOS: 10 days A FACE TO FACE EVALUATION WAS PERFORMED  Meredith Staggers 04/25/2018, 11:12 AM

## 2018-04-25 NOTE — Patient Care Conference (Signed)
Inpatient RehabilitationTeam Conference and Plan of Care Update Date: 04/24/2018   Time: 11:45 AM    Patient Name: [name].      Medical Record Number: [id number]  Date of Birth: [birth year] Sex: Male         Room/Bed: 4M04C/4M04C-01 Payor Info: Payor: MEDICARE / Plan: MEDICARE PART A AND B / Product Type: *No Product type* /    Admitting Diagnosis: Parkinson Dz  Admit Date/Time:  04/15/2018  3:22 PM Admission Comments: No comment available   Primary Diagnosis:  <principal problem not specified> Principal Problem: <principal problem not specified>  Patient Active Problem List   Diagnosis Date Noted  . Labile blood pressure   . Sleep disturbance   . Acute blood loss anemia   . Bradykinesia   . Debility 04/15/2018  . Overactive bladder   . Slow transit constipation   . Diastolic dysfunction   . Thrombocytopenia ([place])   . Hypertension   . Generalized weakness   . TIA (transient ischemic attack) 04/08/2018  . Dementia due to Parkinson's disease without behavioral disturbance ([place]) 12/16/2015  . S/P TAVR (transcatheter aortic valve replacement) 11/18/2013  . Aortic valve disorder 11/18/2013  . Parkinson's disease ([place])   . Severe aortic stenosis 09/26/2013  . Idiopathic Parkinson's disease ([place]) 10/23/2011    Expected Discharge Date: Expected Discharge Date: 04/30/18  Team Members Present: Physician leading conference: Dr. Alysia Penna Social Worker Present: Lennart Pall, LCSW Nurse Present: Other (comment)(Marie Ena Dawley, RN) PT Present: Barrie Folk, PT;Rosita Dechalus, PTA OT Present: Clyda Greener, OT SLP Present: Windell Moulding, SLP PPS Coordinator present : Gunnar Fusi     Current Status/Progress Goal Weekly Team Focus  Medical   improved PO intake. bp's under control. renal function stabilized  improve balance and afety awareness  monitoring hgb and platelets, see above   Bowel/Bladder   Continent of bowel/bladder; LBM 04/23/18  Remain continent of  bowel/bladder with min assist  Assess bowel/bladder function q shift and as needed   Swallow/Nutrition/ Hydration             ADL's   Supervision for UB selfcare with min guard for LB selfcare.  Min guard to min assist for transfers to the shower and the toilet.  Decreased safety awareness   supervision overall  selfcare retraining, balance retraining, transfer training, neuromuscular re-education, pt/family education   Mobility   CGA transfers, CGA with RW, minA no AD gait 13ft, CGA ascending/descending stairs with B rails.   Ambulatory at Supervision assist overall with RW. Supervision assist transfers and bed mobillty   postural control, balance, gait, improved safety with transfers.    Communication             Safety/Cognition/ Behavioral Observations            Pain   Denies pain  ,2  Assess and treat pain q shift and as needed   Skin   No skin issues notad  Maintain skin integrity with min assist  Assess skin q shift and as needed      *See Care Plan and progress notes for long and short-term goals.     Barriers to Discharge  Current Status/Progress Possible Resolutions Date Resolved   Physician    Medical stability        see medical progress notes      Nursing                  PT  OT                  SLP                SW                Discharge Planning/Teaching Needs:  Plan for pt to d/c to his home with family to arrange 24/7 support, however, this may be time limited.  Teaching to be completed with family prior to d/c.   Team Discussion:  No significant medical issues.  Pt with some incont of b/b.  Supervision to min-guard overall with PT/ OT.  Needs a lot of cues to maintain safety.  Most goals set for supervision - min assist.  SW to confirm that family still planning for home d/c.    Revisions to Treatment Plan:  NA    Continued Need for Acute Rehabilitation Level of Care: The patient requires daily medical management by a  physician with specialized training in physical medicine and rehabilitation for the following conditions: Daily direction of a multidisciplinary physical rehabilitation program to ensure safe treatment while eliciting the highest outcome that is of practical value to the patient.: Yes Daily medical management of patient stability for increased activity during participation in an intensive rehabilitation regime.: Yes Daily analysis of laboratory values and/or radiology reports with any subsequent need for medication adjustment of medical intervention for : Neurological problems;Other   I attest that I was present, lead the team conference, and concur with the assessment and plan of the team.   Ellanora Rayborn 04/25/2018, 3:57 PM

## 2018-04-25 NOTE — Progress Notes (Signed)
Occupational Therapy Session Note  Patient Details  Name: [name]. MRN: [id number] Date of Birth: 1941-01-11  Today's Date: 04/25/2018 OT Individual Time: 0902-1003 OT Individual Time Calculation (min): 61 min    Short Term Goals: Week 2:  OT Short Term Goal 1 (Week 2): Continue working on established LTGs set at supervision level.   Skilled Therapeutic Interventions/Progress Updates:    Pt completed shower and dressing during session.  Pt's daughter [name] present for session. Discussed pt's need for 24 hour supervision as well as need for shower seat for safety and 3:1 to help increase independence with toileting.  She will check to see if they have a 3:1, but will need shower seat prior to discharge.  He was able to ambulate into the shower with use of the RW and min guard assist.  Min instructional cueing for hand placement and to not try and pull up on the walker but instead push up from the arms of the chair with all sit to stand.  Min guard for sit to stand in the shower to complete all bathing.  He still tends to not flex his knees or trunk with sit to stand but instead relies on use of the grab bar to pull him up however.  He transferred out to the wheelchair at the sink for dressing.  Min guard assist sit to stand.  Instructed pt and daughter on the need to sit when attempting to donn LB clothing as well as button up shirt.  She voices understanding.  Finished with pt in the wheelchair and call button and phone in reach with pt waiting for next PT session.   Therapy Documentation Precautions:  Precautions Precautions: Fall Restrictions Weight Bearing Restrictions: No  Pain: Pain Assessment Pain Scale: Faces Pain Score: 0-No pain ADL: See Care Tool Section of chart for details  Therapy/Group: Individual Therapy  Ivanka Kirshner,Bert OTR/L 04/25/2018, 12:13 PM

## 2018-04-25 NOTE — Progress Notes (Signed)
Physical Therapy Session Note  Patient Details  Name: [name]. MRN: [id number] Date of Birth: Jun 14, 1940  Today's Date: 04/25/2018 PT Individual Time: 1000-1100 and 1330-1445 PT Individual Time Calculation (min): 60 min and 75 min  Short Term Goals: Week 2:  PT Short Term Goal 1 (Week 2): =LTGs due to ELOS  Skilled Therapeutic Interventions/Progress Updates:    Session One: Pt received seated in WC and agreeable to PT.   Gait training with RW >235f throughout rehab unit with CGA overall. Occasional LOB with turns requiring min assist. Verbal cues to "step into walker" to keep RW closer to trunk.   Daughter present today; provided family education on each of the tasks below. Emphasized the need for close supervision when pt is discharged home.   Car transfer with RW, CGA. Verbal cues for LE sequencing and safety awareness. Performed once with SPT and once with daughter.   Ramp with RW x1 with daughter providing CGA and SPT providing close supervision. Verbal cues to keep RW closer when descending ramp. Verbal cues for increased step length when ascending.  Bed mobility in rehab apartment. Provided family education on the most efficient way to facilitate/cue pt to get into and out of bed. Pt CGA with bed mobility; required mod verbal cues for LE/UE sequencing into and out of bed.   Ended session with pt seated in WC, call bell within reach, and all needs met.    Session Two: Pt received seated in WC and agreeable to PT.  Self propelled WC x1533fto dayroom with supervision assist for pt safety. Stand pivot transfer with RW to nustep, CGA. Verbal cues for LE/AD sequencing.  Nustep x5 min, 2 bouts. Pt required min assist for BLE onto pedals. Verbal cues to maintain eccentric control and decrease speed.   Dynavision x3 with RW. 1 set on firm surface, 2 sets standing on red wedge. Pt min assist to gain balance initially, progressing to CGA throughout activity. Verbal cues  to maintain upright posture and prevent posterior lean.  STS with RW x10, STS with no AD holding ball x10 3sets. Verbal and tactile cues to maintain upright posture and shift weight anteriorly. Pt CGA-min assist throughout exercise to prevent posterior LOB.  Gait training with RW throughout unit >20070fCGA overall. Occasional LOB with turns requiring min assist. Verbal cues to keep RW closer to trunk.   Ended session with pt seated in WC, call bell within reach, and all needs met.    Therapy Documentation Precautions:  Precautions Precautions: Fall Restrictions Weight Bearing Restrictions: No Therapy Vitals Temp: 98 F (36.7 C) Pulse Rate: 69 Resp: 17 BP: 99/60 Patient Position (if appropriate): Sitting Oxygen Therapy SpO2: 97 % O2 Device: Room Air Pain:     Therapy/Group: Individual Therapy  AmaAmador Cunas/04/2018, 3:25 PM

## 2018-04-26 ENCOUNTER — Inpatient Hospital Stay (HOSPITAL_COMMUNITY): Payer: Medicare Other | Admitting: Physical Therapy

## 2018-04-26 ENCOUNTER — Inpatient Hospital Stay (HOSPITAL_COMMUNITY): Payer: Medicare Other | Admitting: Occupational Therapy

## 2018-04-26 ENCOUNTER — Inpatient Hospital Stay (HOSPITAL_COMMUNITY): Payer: Medicare Other

## 2018-04-26 NOTE — Progress Notes (Signed)
Social Work Patient ID: [name]., male   DOB: Dec 04, 1940, 77 y.o.   MRN: [id number]   Have reviewed d/c planning issues with daughter who confirms that she and family have 24/7 assistance covered for pt's d/c on 12/17.  Discussed HH follow up and DME.  Daughter pleased with progress especially in terms of cognition and feels he is much closer to baseline function.    Laretta Pyatt, LCSW

## 2018-04-26 NOTE — Progress Notes (Signed)
Occupational Therapy Session Note  Patient Details  Name: [name]. MRN: [id number] Date of Birth: May 04, 1941  Today's Date: 04/26/2018 OT Individual Time: 0902-1003 OT Individual Time Calculation (min): 61 min    Short Term Goals: Week 2:  OT Short Term Goal 1 (Week 2): Continue working on established LTGs set at supervision level.   Skilled Therapeutic Interventions/Progress Updates:    Pt completed bathing and dressing sit to stand during session.  He was able to ambulate to the shower with min assist without use of an assistive device.  He completed bathing with min guard assist on the shower seat, with min instructional cueing to maintain sitting for UB selfcare.  He transitioned out to the wheelchair for dressing.  Min assist for donning button up shirt.  He was able to donn the shirt and button 90% of the buttons.  He was able to donn LB clothing with min guard assist.  Finished session with grooming tasks of brushing his teeth in standing with supervision and then sitting in the chair to complete combing his hair.  Finished session with call button in reach and safety alarm belt in place.    Therapy Documentation Precautions:  Precautions Precautions: Fall Restrictions Weight Bearing Restrictions: No  Pain: Pain Assessment Pain Score: 0-No pain ADL: See Care Tool Section for some details of ADLs  Therapy/Group: Individual Therapy  Tian Davison,[place] OTR/L 04/26/2018, 12:07 PM

## 2018-04-26 NOTE — Progress Notes (Signed)
Occupational Therapy Session Note  Patient Details  Name: [name]. MRN: [id number] Date of Birth: [birth year]  Today's Date: 04/26/2018 OT Individual Time: 1120-1200 OT Individual Time Calculation (min): 40 min   Skilled Therapeutic Interventions/Progress Updates:    1;1. Pt received in w/c. Pt completes standing folding with progressively larger pieces of laundry with CGA for balance and VC for large UE movements. Pt demo larger smooth movements with sheets and large towels. Pt ambulates with RW back to room with CGA and VC for RW management. Exited session with pt seated in w/c, belt alarm on and call light in reach  Therapy Documentation Precautions:  Precautions Precautions: Fall Restrictions Weight Bearing Restrictions: No General:   Vital Signs:  Pain: Pain Assessment Pain Score: 0-No pain ADL: ADL Eating: Maximal assistance Where Assessed-Eating: Wheelchair Grooming: Supervision/safety Where Assessed-Grooming: Bed level Upper Body Bathing: Supervision/safety Where Assessed-Upper Body Bathing: Shower Lower Body Bathing: Minimal assistance Where Assessed-Lower Body Bathing: Shower Upper Body Dressing: Minimal assistance Where Assessed-Upper Body Dressing: Chair Lower Body Dressing: Moderate assistance Where Assessed-Lower Body Dressing: Chair Toileting: Moderate assistance Toilet Transfer: Moderate assistance Toilet Transfer Method: Counselling psychologist: Grab bars, Raised toilet seat Tub/Shower Transfer: Moderate assistance Tub/Shower Transfer Method: Optometrist: Walk in Retail buyer: Minimal assistance Social research officer, government Method: Heritage manager: Nurse, learning disability    Praxis   Exercises:   Other Treatments:     Therapy/Group: Individual Therapy  Tonny Branch 04/26/2018, 12:09 PM

## 2018-04-26 NOTE — Progress Notes (Signed)
Physical Therapy Session Note  Patient Details  Name: [name]. MRN: [id number] Date of Birth: Nov 16, 1940  Today's Date: 04/26/2018 PT Individual Time: 1500-1600 PT Individual Time Calculation (min): 60 min   Short Term Goals: Week 2:  PT Short Term Goal 1 (Week 2): =LTGs due to ELOS  Skilled Therapeutic Interventions/Progress Updates:    Pt received seated in WC and agreeable to PT.   Gait training x322f to dayroom, x2037fto rehab gym, x15020fo room, CGA with RW. Verbal cues to step into walker; one instance of min assist due to slight LOB making a turn.  Nustep x10 min. Stand pivot with RW onto nustep, CGA with cues for AD sequencing. Verbal cues to maintain eccentric control to prevent shoulder from jerking at end range.   Dynavision x2 with RW standing on red wedge. Pt CGA throughout activity; demonstrates improved carryover from yesterday's session. Verbal cues to maintain upright posture.  STS with no AD holding ball x10, 3sets. Pt CGA throughout exercise with verbal cues to maintain upright posture and shift weight anteriorly.  Ended session with pt seated in WC, call bell within reach, and all needs met.  Therapy Documentation Precautions:  Precautions Precautions: Fall Restrictions Weight Bearing Restrictions: No   Vital Signs: Therapy Vitals Pulse Rate: 63 Resp: 16 BP: 99/68 Patient Position (if appropriate): Sitting Oxygen Therapy SpO2: 100 % O2 Device: Room Air Pain: Pain Assessment Pain Score: 0-No pain   Therapy/Group: Individual Therapy  AmaAmador Cunas/13/2019, 5:25 PM

## 2018-04-26 NOTE — Progress Notes (Signed)
Occupational Therapy Session Note  Patient Details  Name: [place]. MRN: [id number] Date of Birth: Apr 29, 1941  Today's Date: 04/26/2018 OT Individual Time: 8979-1504 OT Individual Time Calculation (min): 28 min    Short Term Goals: Week 2:  OT Short Term Goal 1 (Week 2): Continue working on established LTGs set at supervision level.   Skilled Therapeutic Interventions/Progress Updates:    Pt ambulated down to the ADL apartment with min guard assist using the RW.  He was able to practice stand pivot transfers with the RW with min guard assist from the lower sofa.  Also had pt complete simulated walk-in shower transfer with hand held assist as pt's daughter stated on previous day that she did not think the RW would fit into his bathroom.  He was able to step over the simulated edge with min hand held assist and simulated use of the grab bar he has at home.  He will also need a shower seat, which him and his daughter is in agreement with.  Will need to practice transfer again when family or daughter are present.  Finished session with return to the room and pt left up in the wheelchair with safety belt in place and call button and phone in reach.   Therapy Documentation Precautions:  Precautions Precautions: Fall Restrictions Weight Bearing Restrictions: No  Pain: Pain Assessment Pain Score: 0-No pain   Therapy/Group: Individual Therapy  Daschel Roughton,[place] OTR/L 04/26/2018, 3:15 PM

## 2018-04-26 NOTE — Progress Notes (Signed)
[place] PHYSICAL MEDICINE & REHABILITATION PROGRESS NOTE  Subjective/Complaints: Had an episode of urinary incontinence last night. Very upset about it  ROS: Patient denies fever, rash, sore throat, blurred vision, nausea, vomiting, diarrhea, cough, shortness of breath or chest pain, joint or back pain, headache, or mood change.   Objective: Vital Signs: Blood pressure (!) 148/85, pulse 67, temperature 98.3 F (36.8 C), temperature source Oral, resp. rate 16, height 5\' 6"  (1.676 m), weight 58.3 kg, SpO2 97 %. No results found. No results for input(s): WBC, HGB, HCT, PLT in the last 72 hours. No results for input(s): NA, K, CL, CO2, GLUCOSE, BUN, CREATININE, CALCIUM in the last 72 hours.  Physical Exam: BP (!) 148/85 (BP Location: Left Arm)   Pulse 67   Temp 98.3 F (36.8 C) (Oral)   Resp 16   Ht 5\' 6"  (1.676 m)   Wt 58.3 kg   SpO2 97%   BMI 20.74 kg/m  Constitutional: No distress . Vital signs reviewed. HEENT: EOMI, oral membranes moist Neck: supple Cardiovascular: RRR without murmur. No JVD    Respiratory: CTA Bilaterally without wheezes or rales. Normal effort    GI: BS +, non-tender, non-distended  Neurological:  Alert and oriented x2 with cues. Very dysarthric Makes good eye contact with examiner.  Follow simple commands. Motor: Grossly 4+/5 throughout, ongoing bradykinesia and rigidity unchanged  Skin: Skin is warm and dry.  Psychiatric: anxious and tearful  Assessment/Plan: 1. Functional deficits secondary to Parkinson's disease which require 3+ hours per day of interdisciplinary therapy in a comprehensive inpatient rehab setting.  Physiatrist is providing close team supervision and 24 hour management of active medical problems listed below.  Physiatrist and rehab team continue to assess barriers to discharge/monitor patient progress toward functional and medical goals  Care Tool:  Bathing    Body parts bathed by patient: Right arm, Left arm, Chest,  Abdomen, Front perineal area, Buttocks, Right upper leg, Left upper leg, Right lower leg, Left lower leg, Face         Bathing assist Assist Level: Contact Guard/Touching assist     Upper Body Dressing/Undressing Upper body dressing   What is the patient wearing?: Button up shirt    Upper body assist Assist Level: Minimal Assistance - Patient > 75%    Lower Body Dressing/Undressing Lower body dressing      What is the patient wearing?: Incontinence brief, Pants     Lower body assist Assist for lower body dressing: Contact Guard/Touching assist     Toileting Toileting    Toileting assist Assist for toileting: Moderate Assistance - Patient 50 - 74%     Transfers Chair/bed transfer  Transfers assist     Chair/bed transfer assist level: Contact Guard/Touching assist     Locomotion Ambulation   Ambulation assist      Assist level: Contact Guard/Touching assist Assistive device: Walker-rolling Max distance: 250   Walk 10 feet activity   Assist     Assist level: Contact Guard/Touching assist Assistive device: Walker-rolling   Walk 50 feet activity   Assist    Assist level: Contact Guard/Touching assist Assistive device: Walker-rolling    Walk 150 feet activity   Assist Walk 150 feet activity did not occur: Safety/medical concerns  Assist level: Contact Guard/Touching assist Assistive device: Walker-rolling    Walk 10 feet on uneven surface  activity   Assist Walk 10 feet on uneven surfaces activity did not occur: Safety/medical concerns         Wheelchair  Assist   Type of Wheelchair: Manual    Wheelchair assist level: Supervision/Verbal cueing Max wheelchair distance: 150    Wheelchair 50 feet with 2 turns activity    Assist        Assist Level: Supervision/Verbal cueing   Wheelchair 150 feet activity     Assist Wheelchair 150 feet activity did not occur: Safety/medical concerns   Assist Level:  Supervision/Verbal cueing      Medical Problem List and Plan: 1.  Debility secondary to  Parkinson's disease/multi-medical  Continue CIR  2.  DVT Prophylaxis/Anticoagulation: Subcutaneous Lovenox  3. Pain Management:  Tylenol as needed 4. Mood:  Klonopin 0.5 mg daily at bedtime, Aricept 10 mg daily at bedtime  Zoloft 12.5 mg daily d/ced on 12/4 per daughter (plan was to wean) 5. Neuropsych: This patient is not capable of making decisions on his own behalf. 6. Skin/Wound Care:  Routine skin checks 7. Fluids/Electrolytes/Nutrition:  Routine in and out's   BMP within acceptable range on 12/6, creatinine within normal range on 12/9 8. Parkinson's disease. Sinemet 2 tablets 4 times a day. Follow-up outpatient neurology Dr. Skipper Cliche 9.  History of aortic stenosis. Status post TAVR procedure. Continue aspirin 10. Constipation. Laxative assistance 11.Overactive bladder. Myrbetriq 12. ABLA  Hemoglobin 11.9 on 12/9  Continue to monitor 13.  Thrombocytopenia  Platelets 113 on 12/9  Continue to monitor 14.? Sleep disturbance  Continue Sleep chart 15.  Labile blood pressure  Improved control 12/13 16. Episode of urinary incontinence: observe for further issues, reassured patient today  LOS: 11 days A FACE TO FACE EVALUATION WAS PERFORMED  Meredith Staggers 04/26/2018, 8:39 AM

## 2018-04-27 NOTE — Progress Notes (Signed)
[place] PHYSICAL MEDICINE & REHABILITATION PROGRESS NOTE  Subjective/Complaints: In much better spirits this morning. Good appetite.   ROS: Patient denies fever, rash, sore throat, blurred vision, nausea, vomiting, diarrhea, cough, shortness of breath or chest pain, joint or back pain, headache, or mood change.   Objective: Vital Signs: Blood pressure 132/75, pulse 66, temperature 97.7 F (36.5 C), temperature source Oral, resp. rate 15, height 5\' 6"  (2.119 m), weight 58.3 kg, SpO2 95 %. No results found. No results for input(s): WBC, HGB, HCT, PLT in the last 72 hours. No results for input(s): NA, K, CL, CO2, GLUCOSE, BUN, CREATININE, CALCIUM in the last 72 hours.  Physical Exam: BP 132/75 (BP Location: Left Arm)   Pulse 66   Temp 97.7 F (36.5 C) (Oral)   Resp 15   Ht 5\' 6"  (1.676 m)   Wt 58.3 kg   SpO2 95%   BMI 20.74 kg/m  Constitutional: No distress . Vital signs reviewed. HEENT: EOMI, oral membranes moist Neck: supple Cardiovascular: RRR without murmur. No JVD    Respiratory: CTA Bilaterally without wheezes or rales. Normal effort    GI: BS +, non-tender, non-distended  Neurological:  Alert and oriented x2 with cues. Very dysarthric/muffled speech  Follow simple commands. Motor: Grossly 4+/5 throughout, ongoing bradykinesia and rigidity-stable  Skin: Skin is warm and dry.  Psychiatric: smiling, pleasant  Assessment/Plan: 1. Functional deficits secondary to Parkinson's disease which require 3+ hours per day of interdisciplinary therapy in a comprehensive inpatient rehab setting.  Physiatrist is providing close team supervision and 24 hour management of active medical problems listed below.  Physiatrist and rehab team continue to assess barriers to discharge/monitor patient progress toward functional and medical goals  Care Tool:  Bathing    Body parts bathed by patient: Right arm, Left arm, Chest, Abdomen, Front perineal area, Buttocks, Right upper leg, Left  upper leg, Right lower leg, Left lower leg, Face         Bathing assist Assist Level: Contact Guard/Touching assist     Upper Body Dressing/Undressing Upper body dressing   What is the patient wearing?: Button up shirt    Upper body assist Assist Level: Minimal Assistance - Patient > 75%    Lower Body Dressing/Undressing Lower body dressing      What is the patient wearing?: Incontinence brief, Pants     Lower body assist Assist for lower body dressing: Contact Guard/Touching assist     Toileting Toileting    Toileting assist Assist for toileting: Moderate Assistance - Patient 50 - 74%     Transfers Chair/bed transfer  Transfers assist     Chair/bed transfer assist level: Contact Guard/Touching assist     Locomotion Ambulation   Ambulation assist      Assist level: Contact Guard/Touching assist Assistive device: Walker-rolling Max distance: 200   Walk 10 feet activity   Assist     Assist level: Contact Guard/Touching assist Assistive device: Walker-rolling   Walk 50 feet activity   Assist    Assist level: Contact Guard/Touching assist Assistive device: Walker-rolling    Walk 150 feet activity   Assist Walk 150 feet activity did not occur: Safety/medical concerns  Assist level: Contact Guard/Touching assist Assistive device: Walker-rolling    Walk 10 feet on uneven surface  activity   Assist Walk 10 feet on uneven surfaces activity did not occur: Safety/medical concerns         Wheelchair     Assist   Type of Wheelchair: Manual  Wheelchair assist level: Supervision/Verbal cueing Max wheelchair distance: 150    Wheelchair 50 feet with 2 turns activity    Assist        Assist Level: Supervision/Verbal cueing   Wheelchair 150 feet activity     Assist Wheelchair 150 feet activity did not occur: Safety/medical concerns   Assist Level: Supervision/Verbal cueing      Medical Problem List and  Plan: 1.  Debility secondary to  Parkinson's disease/multi-medical  Continue CIR  2.  DVT Prophylaxis/Anticoagulation: Subcutaneous Lovenox  3. Pain Management:  Tylenol as needed 4. Mood:  Klonopin 0.5 mg daily at bedtime, Aricept 10 mg daily at bedtime  Zoloft 12.5 mg daily d/ced on 12/4 per daughter (plan was to wean) 5. Neuropsych: This patient is not capable of making decisions on his own behalf. 6. Skin/Wound Care:  Routine skin checks 7. Fluids/Electrolytes/Nutrition:  Routine in and out's   BMP within acceptable range on 12/6, creatinine within normal range on 12/9  -eating well 8. Parkinson's disease. Sinemet 2 tablets 4 times a day. Follow-up outpatient neurology Dr. Skipper Cliche 9.  History of aortic stenosis. Status post TAVR procedure. Continue aspirin 10. Constipation. Laxative assistance 11.Overactive bladder. Myrbetriq 12. ABLA  Hemoglobin 11.9 on 12/9  Continue to monitor 13.  Thrombocytopenia  Platelets 113 on 12/9  Continue to monitor 14.? Sleep disturbance  Continue Sleep chart 15.  Labile blood pressure  Improved control 12/14 16. Episode of urinary incontinence: observe for further issues, reassured patient today  LOS: 12 days A FACE TO FACE EVALUATION WAS PERFORMED  Meredith Staggers 04/27/2018, 9:05 AM

## 2018-04-28 ENCOUNTER — Inpatient Hospital Stay (HOSPITAL_COMMUNITY): Payer: Medicare Other

## 2018-04-28 DIAGNOSIS — R3981 Functional urinary incontinence: Secondary | ICD-10-CM

## 2018-04-28 NOTE — Progress Notes (Signed)
[place] PHYSICAL MEDICINE & REHABILITATION PROGRESS NOTE  Subjective/Complaints: Again feeling well. Voiced some concerns about urinary incontinence.   ROS: Patient denies fever, rash, sore throat, blurred vision, nausea, vomiting, diarrhea, cough, shortness of breath or chest pain, joint or back pain, headache, or mood change.   Objective: Vital Signs: Blood pressure (!) 101/53, pulse 60, temperature 98.3 F (36.8 C), resp. rate 19, height 5\' 6"  (1.676 m), weight 58.3 kg, SpO2 99 %. No results found. No results for input(s): WBC, HGB, HCT, PLT in the last 72 hours. No results for input(s): NA, K, CL, CO2, GLUCOSE, BUN, CREATININE, CALCIUM in the last 72 hours.  Physical Exam: BP (!) 101/53 (BP Location: Left Arm)   Pulse 60   Temp 98.3 F (36.8 C)   Resp 19   Ht 5\' 6"  (5.732 m)   Wt 58.3 kg   SpO2 99%   BMI 20.74 kg/m  Constitutional: No distress . Vital signs reviewed. HEENT: EOMI, oral membranes moist Neck: supple Cardiovascular: RRR without murmur. No JVD    Respiratory: CTA Bilaterally without wheezes or rales. Normal effort    GI: BS +, non-tender, non-distended   Neurological:  Alert and oriented x2 with cues. Very dysarthric/muffled speech  Follow simple commands. Motor: Grossly 4+/5 throughout, ongoing bradykinesia and rigidity-stable  Skin: Skin is warm and dry.  Psychiatric: smiling, pleasant  Assessment/Plan: 1. Functional deficits secondary to Parkinson's disease which require 3+ hours per day of interdisciplinary therapy in a comprehensive inpatient rehab setting.  Physiatrist is providing close team supervision and 24 hour management of active medical problems listed below.  Physiatrist and rehab team continue to assess barriers to discharge/monitor patient progress toward functional and medical goals  Care Tool:  Bathing    Body parts bathed by patient: Right arm, Left arm, Chest, Abdomen, Front perineal area, Buttocks, Right upper leg, Left  upper leg, Right lower leg, Left lower leg, Face         Bathing assist Assist Level: Contact Guard/Touching assist     Upper Body Dressing/Undressing Upper body dressing   What is the patient wearing?: Button up shirt    Upper body assist Assist Level: Minimal Assistance - Patient > 75%    Lower Body Dressing/Undressing Lower body dressing      What is the patient wearing?: Incontinence brief, Pants     Lower body assist Assist for lower body dressing: Contact Guard/Touching assist     Toileting Toileting    Toileting assist Assist for toileting: Contact Guard/Touching assist     Transfers Chair/bed transfer  Transfers assist     Chair/bed transfer assist level: Contact Guard/Touching assist     Locomotion Ambulation   Ambulation assist      Assist level: Contact Guard/Touching assist Assistive device: Walker-rolling Max distance: 200   Walk 10 feet activity   Assist     Assist level: Contact Guard/Touching assist Assistive device: Walker-rolling   Walk 50 feet activity   Assist    Assist level: Contact Guard/Touching assist Assistive device: Walker-rolling    Walk 150 feet activity   Assist Walk 150 feet activity did not occur: Safety/medical concerns  Assist level: Contact Guard/Touching assist Assistive device: Walker-rolling    Walk 10 feet on uneven surface  activity   Assist Walk 10 feet on uneven surfaces activity did not occur: Safety/medical concerns         Wheelchair     Assist   Type of Wheelchair: Manual    Wheelchair assist  level: Supervision/Verbal cueing Max wheelchair distance: 150    Wheelchair 50 feet with 2 turns activity    Assist        Assist Level: Supervision/Verbal cueing   Wheelchair 150 feet activity     Assist Wheelchair 150 feet activity did not occur: Safety/medical concerns   Assist Level: Supervision/Verbal cueing      Medical Problem List and Plan: 1.   Debility secondary to  Parkinson's disease/multi-medical  Continue CIR  2.  DVT Prophylaxis/Anticoagulation: Subcutaneous Lovenox  3. Pain Management:  Tylenol as needed 4. Mood:  Klonopin 0.5 mg daily at bedtime, Aricept 10 mg daily at bedtime  Zoloft 12.5 mg daily d/ced on 12/4 per daughter (plan was to wean) 5. Neuropsych: This patient is not capable of making decisions on his own behalf. 6. Skin/Wound Care:  Routine skin checks 7. Fluids/Electrolytes/Nutrition:  Routine in and out's   BMP within acceptable range on 12/6, creatinine within normal range on 12/9  -eating well 8. Parkinson's disease. Sinemet 2 tablets 4 times a day. Follow-up outpatient neurology Dr. Skipper Cliche 9.  History of aortic stenosis. Status post TAVR procedure. Continue aspirin 10. Constipation. Laxative assistance 11.Overactive bladder. Myrbetriq 12. ABLA  Hemoglobin 11.9 on 12/9  Continue to monitor 13.  Thrombocytopenia  Platelets 113 on 12/9  Continue to monitor 14.? Sleep disturbance  Continue Sleep chart 15.  Labile blood pressure  Improved control 12/14 16. Intermittent urinary incontinence:  -check UA,UCX  -check pvr's  LOS: 13 days A FACE TO FACE EVALUATION WAS PERFORMED  Meredith Staggers 04/28/2018, 3:10 PM

## 2018-04-28 NOTE — Progress Notes (Signed)
Occupational Therapy Session Note  Patient Details  Name: [name]. MRN: [id number] Date of Birth: 1940/07/16  Today's Date: 04/28/2018 OT Individual Time: 1300-1402 OT Individual Time Calculation (min): 62 min    Short Term Goals: Week 2:  OT Short Term Goal 1 (Week 2): Continue working on established LTGs set at supervision level.   Skilled Therapeutic Interventions/Progress Updates:    1:1. Pt ambulates to/from all tx destinations with CGA fading to supervision with VC for keeping feet inside RW. Pt completes kitchen search activity with RW bag toretrieve items from  Various cabinet levels/appliances with S and VC for RW management/positioning/ hand placement when reaching for safety. Pt completes couch transfer with VC for hand palcement. Pt completes boxing activity 50 punches uppercut, jab, and cross with CGA for side stepping around boxing bag. Exited session with RN/family in room and pt seated in recliner with belt alarm on and call light in reach  Therapy Documentation Precautions:  Precautions Precautions: Fall Restrictions Weight Bearing Restrictions: No General:   Vital Signs:   Therapy/Group: Individual Therapy  Tonny Branch 04/28/2018, 2:03 PM

## 2018-04-29 ENCOUNTER — Inpatient Hospital Stay (HOSPITAL_COMMUNITY): Payer: Medicare Other | Admitting: Physical Therapy

## 2018-04-29 ENCOUNTER — Inpatient Hospital Stay (HOSPITAL_COMMUNITY): Payer: Medicare Other | Admitting: Occupational Therapy

## 2018-04-29 LAB — CREATININE, SERUM
Creatinine, Ser: 1.03 mg/dL (ref 0.61–1.24)
GFR calc Af Amer: >60 mL/min (ref >60)

## 2018-04-29 NOTE — Progress Notes (Signed)
Occupational Therapy Session Note  Patient Details  Name: [name]. MRN: [id number] Date of Birth: [birth year]  Today's Date: 04/29/2018 OT Individual Time: 0930-1005 OT Individual Time Calculation (min): 35 min    Short Term Goals: Week 1:  OT Short Term Goal 1 (Week 1): Pt will perform LB bathing sit to stand with supervision for 2 consecutive sessions.  OT Short Term Goal 1 - Progress (Week 1): Not met OT Short Term Goal 2 (Week 1): Pt will perform LB dressing sit to stand with supervision and 2 consecutive sessions.  OT Short Term Goal 2 - Progress (Week 1): Met OT Short Term Goal 3 (Week 1): Pt will complete all aspects of toileting with supervision using LRAD and elevated toilet.  OT Short Term Goal 3 - Progress (Week 1): Not met OT Short Term Goal 4 (Week 1): Pt will demonstrate emergent awareness of balance issues and sit down to thread LB clothing with no more than one questioning cue.  OT Short Term Goal 4 - Progress (Week 1): Not met Week 2:  OT Short Term Goal 1 (Week 2): Continue working on established LTGs set at supervision level.   Skilled Therapeutic Interventions/Progress Updates:      Pt seen for BADL retraining of  bathing and dressing with a focus on balance and postural awareness of balance. See ADL documentation below.  Pt did have a slight posterior lean in standing but used tub bench and w/c to support himself. When cued to slide his feet back, then pt was able to stand with improved posture.  He bathed and dressed with min verbal cues for safety precautions, such as sitting down to don/doff clothing over feet.  Pt completed dressing except for socks and shoes so NT arrived to S pt as he completed the task.   Therapy Documentation Precautions:  Precautions Precautions: Fall Restrictions Weight Bearing Restrictions: No   Pain: Pain Assessment Pain Scale: 0-10 Pain Score: 0-No pain ADL: ADL Eating: Set up Where Assessed-Eating:  Wheelchair Grooming: Setup Where Assessed-Grooming: Sitting at sink Upper Body Bathing: Supervision/safety Where Assessed-Upper Body Bathing: Shower Lower Body Bathing: Supervision/safety Where Assessed-Lower Body Bathing: Shower Upper Body Dressing: Supervision/safety Where Assessed-Upper Body Dressing: Chair Lower Body Dressing: Supervision/safety Where Assessed-Lower Body Dressing: Chair Toileting: Supervision/safety Where Assessed-Toileting: Glass blower/designer: Close supervision Toilet Transfer Method: Counselling psychologist: Grab bars, Raised toilet seat Tub/Shower Transfer: Moderate assistance Tub/Shower Transfer Method: Ambulating Tub/Shower Equipment: Walk in Retail buyer: Close supervision Social research officer, government Method: Heritage manager: Shower seat with back   Therapy/Group: Individual Therapy  Iasha Mccalister 04/29/2018, 12:31 PM

## 2018-04-29 NOTE — Progress Notes (Signed)
[place] PHYSICAL MEDICINE & REHABILITATION PROGRESS NOTE  Subjective/Complaints: Patient seen sitting up in a chair this morning.  He states he slept well overnight.  He states he had a good weekend.  He states he is ready for discharge tomorrow.  ROS: Denies CP, SOB, N/V/D  Objective: Vital Signs: Blood pressure 115/64, pulse 65, temperature 98.1 F (36.7 C), temperature source Oral, resp. rate 16, height 5\' 6"  (1.676 m), weight 58.3 kg, SpO2 99 %. No results found. No results for input(s): WBC, HGB, HCT, PLT in the last 72 hours. Recent Labs    04/29/18 0526  CREATININE 1.03    Physical Exam: BP 115/64 (BP Location: Left Arm)   Pulse 65   Temp 98.1 F (36.7 C) (Oral)   Resp 16   Ht 5\' 6"  (1.676 m)   Wt 58.3 kg   SpO2 99%   BMI 20.74 kg/m  Constitutional: No distress . Vital signs reviewed. HENT: Normocephalic.  Atraumatic. Eyes: EOMI. No discharge. Cardiovascular: RRR. No JVD. Respiratory: CTA Bilaterally. Normal effort. GI: BS +. Non-distended. Musc: No edema or tenderness in extremities. Neurological:  Alert  Dysarthria Follow simple commands. Motor: Grossly 4+/5 throughout, limited by bradykinesia and rigidity Skin: Skin is warm and dry.  Psychiatric: smiling, pleasant  Assessment/Plan: 1. Functional deficits secondary to Parkinson's disease which require 3+ hours per day of interdisciplinary therapy in a comprehensive inpatient rehab setting.  Physiatrist is providing close team supervision and 24 hour management of active medical problems listed below.  Physiatrist and rehab team continue to assess barriers to discharge/monitor patient progress toward functional and medical goals  Care Tool:  Bathing    Body parts bathed by patient: Right arm, Left arm, Chest, Abdomen, Front perineal area, Buttocks, Right upper leg, Left upper leg, Right lower leg, Left lower leg, Face         Bathing assist Assist Level: Contact Guard/Touching assist      Upper Body Dressing/Undressing Upper body dressing   What is the patient wearing?: Button up shirt    Upper body assist Assist Level: Minimal Assistance - Patient > 75%    Lower Body Dressing/Undressing Lower body dressing      What is the patient wearing?: Incontinence brief, Pants     Lower body assist Assist for lower body dressing: Contact Guard/Touching assist     Toileting Toileting    Toileting assist Assist for toileting: Contact Guard/Touching assist     Transfers Chair/bed transfer  Transfers assist     Chair/bed transfer assist level: Contact Guard/Touching assist     Locomotion Ambulation   Ambulation assist      Assist level: Contact Guard/Touching assist Assistive device: Walker-rolling Max distance: 200   Walk 10 feet activity   Assist     Assist level: Contact Guard/Touching assist Assistive device: Walker-rolling   Walk 50 feet activity   Assist    Assist level: Contact Guard/Touching assist Assistive device: Walker-rolling    Walk 150 feet activity   Assist Walk 150 feet activity did not occur: Safety/medical concerns  Assist level: Contact Guard/Touching assist Assistive device: Walker-rolling    Walk 10 feet on uneven surface  activity   Assist Walk 10 feet on uneven surfaces activity did not occur: Safety/medical concerns         Wheelchair     Assist   Type of Wheelchair: Manual    Wheelchair assist level: Supervision/Verbal cueing Max wheelchair distance: 150    Wheelchair 50 feet with 2 turns activity  Assist        Assist Level: Supervision/Verbal cueing   Wheelchair 150 feet activity     Assist Wheelchair 150 feet activity did not occur: Safety/medical concerns   Assist Level: Supervision/Verbal cueing      Medical Problem List and Plan: 1.  Debility secondary to  Parkinson's disease/multi-medical  Continue CIR  Plan for d/c tomorrow  Will see patient for  transitional care management in 1-2 weeks post-discharge 2.  DVT Prophylaxis/Anticoagulation: Subcutaneous Lovenox  3. Pain Management:  Tylenol as needed 4. Mood:  Klonopin 0.5 mg daily at bedtime, Aricept 10 mg daily at bedtime  Zoloft 12.5 mg daily d/ced on 12/4 per daughter (plan was to wean) 5. Neuropsych: This patient is not capable of making decisions on his own behalf. 6. Skin/Wound Care:  Routine skin checks 7. Fluids/Electrolytes/Nutrition:  Routine in and out's   BMP within acceptable range on 12/6, creatinine within normal range on 12/16 8. Parkinson's disease. Sinemet 2 tablets 4 times a day. Follow-up outpatient neurology Dr. Skipper Cliche 9.  History of aortic stenosis. Status post TAVR procedure. Continue aspirin 10. Constipation. Laxative assistance 11.Overactive bladder. Myrbetriq 12. ABLA  Hemoglobin 11.9 on 12/9  Continue to monitor 13.  Thrombocytopenia  Platelets 113 on 12/9  Will require ambulatory monitoring  Continue to monitor 14.? Sleep disturbance  Continue Sleep chart  Improving 15.  Labile blood pressure  Controlled on 12/16  LOS: 14 days A FACE TO FACE EVALUATION WAS PERFORMED  Kemisha Bonnette Lorie Phenix 04/29/2018, 9:24 AM

## 2018-04-29 NOTE — Progress Notes (Signed)
Physical Therapy Discharge Summary  Patient Details  Name: [name]. MRN: [id number] Date of Birth: Dec 22, 1940  Today's Date: 04/29/2018    Patient has met 10 of 10 long term goals due to improved activity tolerance, improved balance, improved postural control, increased strength, improved attention, improved awareness and improved coordination.  Patient to discharge at an ambulatory level Supervision.   Patient's care partner is independent to provide the necessary physical and cognitive assistance at discharge.  Reasons goals not met: N/A goals met  Recommendation:  Patient will benefit from ongoing skilled PT services in home health setting to continue to advance safe functional mobility, address ongoing impairments in strength, balance, endurance, safety awareness, and to minimize fall risk.  Equipment: No equipment provided  Reasons for discharge: treatment goals met  Patient/family agrees with progress made and goals achieved: Yes  PT Discharge Precautions/Restrictions   Vital Signs Therapy Vitals Temp: 97.9 F (36.6 C) Temp Source: Oral Pulse Rate: (!) 57 Resp: 17 BP: (!) 109/49 Patient Position (if appropriate): Sitting Oxygen Therapy SpO2: 99 % O2 Device: Room Air Pain Pain Assessment Pain Scale: 0-10 Pain Score: 0-No pain Vision/Perception  Perception Perception: Within Functional Limits Praxis Praxis: Intact  Cognition Overall Cognitive Status: History of cognitive impairments - at baseline Arousal/Alertness: Awake/alert Orientation Level: Oriented X4 Sensation Sensation Light Touch: Appears Intact Hot/Cold: Appears Intact Proprioception: Appears Intact Stereognosis: Not tested Coordination Gross Motor Movements are Fluid and Coordinated: No Fine Motor Movements are Fluid and Coordinated: No Coordination and Movement Description: Minimal Parkinsonian tremors and festination w/ volitional movement and functional use.  He is able to  fasten the buttons on his shirt with extra time. Motor  Motor Motor: Abnormal tone Motor - Discharge Observations: Parkinsonian tremors  Mobility Bed Mobility Bed Mobility: Rolling Right;Rolling Left;Supine to Sit;Sit to Supine Rolling Right: Independent with assistive device Rolling Left: Independent with assistive device Supine to Sit: Supervision/Verbal cueing Sit to Supine: Supervision/Verbal cueing Transfers Transfers: Stand to Sit;Sit to Stand Sit to Stand: Supervision/Verbal cueing Stand to Sit: Supervision/Verbal cueing Locomotion  Gait Gait Assistance: Supervision/Verbal cueing Gait Distance (Feet): 150 Feet Assistive device: Rolling walker Gait Assistance Details: Verbal cues for safe use of DME/AE Gait Assistance Details: forward flexed posture, verbal cues for staying within RW  Gait Gait: Yes Gait Pattern: Impaired Gait Pattern: Festinating;Narrow base of support;Poor foot clearance - right;Poor foot clearance - left Gait velocity: decreased  Trunk/Postural Assessment  Cervical Assessment Cervical Assessment: Exceptions to WFL(forward head) Thoracic Assessment Thoracic Assessment: Exceptions to WFL(rounded shoulders) Lumbar Assessment Lumbar Assessment: Exceptions to WFL(posterior pelvic tilt) Postural Control Postural Control: Deficits on evaluation  Balance Balance Balance Assessed: Yes Static Sitting Balance Static Sitting - Balance Support: Feet supported Static Sitting - Level of Assistance: 7: Independent Dynamic Sitting Balance Dynamic Sitting - Balance Support: Feet supported Dynamic Sitting - Level of Assistance: 5: Stand by assistance Dynamic Sitting - Balance Activities: Tourist information centre manager Standing - Balance Support: During functional activity Static Standing - Level of Assistance: 5: Stand by assistance Dynamic Standing Balance Dynamic Standing - Balance Support: During functional activity Dynamic Standing - Level of  Assistance: 4: Min assist Extremity Assessment  RUE Assessment Active Range of Motion (AROM) Comments: AROM shoulder flexion 0-130 degrees bilaterally.  All other joints AROM WFLS with slight tremor noted and occasional jerking or arms.  Strength throughout 4/5 LUE Assessment Active Range of Motion (AROM) Comments: AROM shoulder flexion 0-130 degrees bilaterally.  All other joints AROM WFLS with slight  tremor noted and occasional jerking or arms.  Strength throughout 4/5 RLE Assessment RLE Assessment: Exceptions to [facility] General Strength Comments: 5/5 globally LLE Assessment LLE Assessment: Exceptions to [facility] Passive Range of Motion (PROM) Comments: hamstring tightness General Strength Comments: 5/5 globally    Deniece Ree PT, DPT, CBIS  Supplemental Physical Therapist [place]    Pager [phone] Acute Rehab Office [phone]

## 2018-04-29 NOTE — Discharge Instructions (Signed)
Inpatient Rehab Discharge Instructions  [name]. Discharge date and time: No discharge date for patient encounter.   Activities/Precautions/ Functional Status: Activity: activity as tolerated Diet: regular diet Wound Care: none needed Functional status:  ___ No restrictions     ___ Walk up steps independently ___ 24/7 supervision/assistance   ___ Walk up steps with assistance ___ Intermittent supervision/assistance  ___ Bathe/dress independently ___ Walk with walker     _x__ Bathe/dress with assistance ___ Walk Independently    ___ Shower independently ___ Walk with assistance    ___ Shower with assistance ___ No alcohol     ___ Return to work/school ________    COMMUNITY REFERRALS UPON DISCHARGE:    Home Health:   PT     OT                      Agency:  [place] Phone: [phone]      Special Instructions:    My questions have been answered and I understand these instructions. I will adhere to these goals and the provided educational materials after my discharge from the hospital.  Patient/Caregiver Signature _______________________________ Date __________  Clinician Signature _______________________________________ Date __________  Please bring this form and your medication list with you to all your follow-up doctor's appointments.

## 2018-04-29 NOTE — Progress Notes (Signed)
Physical Therapy Session Note  Patient Details  Name: [name]. MRN: [id number] Date of Birth: 1940-05-25  Today's Date: 04/29/2018 PT Individual Time: 1115-1203 PT Individual Time Calculation (min): 48 min   Short Term Goals: Week 2:  PT Short Term Goal 1 (Week 2): =LTGs due to ELOS  Skilled Therapeutic Interventions/Progress Updates:    Patient received asleep in [facility], pleasant and willing to participate. Transported to PT gym via [facility] and able to perform transfer to Nustep with min guard and heavy cues for safety and sequencing. Tolerated riding Nustep for 10 minutes with B LEs/UEs on level 5 at first at 40SPM and increasing to 70SPM. Min guard/heavy cues for safety to transfer back to [facility], then able to ambulate approximately 229f with Min guard and RW. Practiced car transfers with min guard, unsteady surfaces with ModA and RW for safety, single curb with ModA and RW due to safety concerns. He reports he did not sleep well last night and that is why he is not moving as well as usual this morning. He was [facility] education provided regarding importance of regular exercise with PD and benefits of support group participation. He was left up in his chair with all needs met and seat belt alarm active.   Therapy Documentation Precautions:  Precautions Precautions: Fall Restrictions Weight Bearing Restrictions: No General:   Vital Signs:   Pain: Pain Assessment Pain Scale: 0-10 Pain Score: 0-No pain    Therapy/Group: Individual Therapy  KDeniece ReePT, DPT, CBIS  Supplemental Physical Therapist [facility]   Pager [phone] Rehab Office [phone]  04/29/2018, 12:50 PM

## 2018-04-29 NOTE — Progress Notes (Signed)
Physical Therapy Session Note  Patient Details  Name: [name]. MRN: [id number] Date of Birth: 01/20/1941  Today's Date: 04/29/2018 PT Individual Time: 1305-1420 and [id number]  PT Individual Time Calculation (min): 75 min and 30 min  Short Term Goals: Week 2:  PT Short Term Goal 1 (Week 2): =LTGs due to ELOS  Skilled Therapeutic Interventions/Progress Updates: Tx1: Pt presented in bathroom with nsg present agreeable to therapy. Pt ambulated bathroom to sink no AD CGA and performed hand hygiene SBA. Pt handed RW and ambulated to rehab gym CGA. Participated in functional activities including stairs, curb training, bed mobility and furniture transfers. Pt's sister present and asking questions regarding curb training HHA vs RW. Performed both ways and noted that pt was much safer with RW vs HHA with sister in agreement. Pt participated in seated and standing dynamic balance activities with use of zoom ball with pt demonstrating fair balance without LOB. Discussed with pt's sister safe method for transfers and use of vc's for safe use of RW with sister verbalizing understanding. Pt ambulated back to room with RW CGA fading to close S and pt returned to w/c. Pt left in w/c with belt alarm on, call bell within reach and needs met.   Tx2: Pt presented in w/c sleeping but easily aroused and agreeable to therapy. Ambulated to ortho gym to re-do car transfer with CGA with RW. First attempt with car transfer pt required verbal and tactile cues for sequencing with CGA level, second attempt pt was able to perform only requiring min verbal cues for sequencing. Pt ambulated to rehab gym and participated in sitting and standing therex as follows: LAQ 2 x10 Hip flexion 2 x 10 Hip flexion with opposite hand touching knee 2 x 10 Hip abd in sitting 2 x 10 Hamstring pulls with level 2 resistance band 2 x 10 Standing hip abd 2 x 10  Pt was able to complete tasks with min verbal cues for technique with  overall good tolerance. Pt ambulated back to room at end of session with RW and close S and returned to w/c in same manner as prior. Pt left in w/c with belt alarm on, call bell within reach and needs met.      Therapy Documentation Precautions:  Precautions Precautions: Fall Restrictions Weight Bearing Restrictions: No General:   Vital Signs:   Pain: Pain Assessment Pain Scale: 0-10 Pain Score: 0-No pain Mobility:   Locomotion :    Trunk/Postural Assessment : Postural Control Postural Control: Deficits on evaluation(posterior lean bias)  Balance: Static Sitting Balance Static Sitting - Level of Assistance: 7: Independent Dynamic Sitting Balance Dynamic Sitting - Level of Assistance: 5: Stand by assistance Static Standing Balance Static Standing - Level of Assistance: 5: Stand by assistance Dynamic Standing Balance Dynamic Standing - Level of Assistance: 4: Min assist Exercises:   Other Treatments:      Therapy/Group: Individual Therapy  Toney Lizaola  Kalee Broxton, PTA  04/29/2018, 2:47 PM

## 2018-04-29 NOTE — Discharge Summary (Addendum)
NAME: [name] MEDICAL RECORD [id number] ACCOUNT [id number] DATE OF BIRTH:11/24/1940 FACILITY: [facility]: [facility] PHYSICIAN:ANKIT PATEL, MD  DISCHARGE SUMMARY  DATE OF DISCHARGE:  04/30/2018  DISCHARGE DIAGNOSES: 1.  Debility secondary to Parkinson's disease.   2.  Subcutaneous Lovenox for deep venous thrombosis  prophylaxis.   3.  Mood.   4.  Parkinson's disease.   5.  History of aortic stenosis.   6.  Acute on chronic anemia.   7.  Labile blood pressure.  HOSPITAL COURSE:  This is a 77 year old right-handed male with history of headache, aortic stenosis, status post TAVR procedure, Parkinson's disease followed by neurology services, [name], maintained on Sinemet as well as Aricept.  Per chart  review and family, lives alone.  Used a cane in the house.  Community ambulator.  Ramped entry to the home.  Has a son and daughter in the area.  Presented 04/08/2018 with significant difficulty in gait with bradykinesia.   The patient with history of  lid opening apraxia.  Plans for Botox in the future.  MRI reviewed showed atrophy.  Per report cerebral atrophy with small hygromas without mass effect.  Echocardiogram with ejection fraction of 18%, grade I diastolic dysfunction.  Carotid Dopplers with  no ICA stenosis.  He did have a KUB revealing large volume of stool filling the entire  colon.  Neurology consulted for recommendations and adjustments in his Sinemet.  Subcutaneous Lovenox for DVT prophylaxis.  Tolerating a regular diet.  The patient  was admitted for comprehensive rehabilitation program.  PAST MEDICAL HISTORY:  See discharge diagnoses.  SOCIAL HISTORY:  Lives alone, has a son and daughter in the area arranging for assistance on discharge.  FUNCTIONAL STATUS:  Upon admission to rehab services was moderate assist with stand pivot transfers, moderate assist 60 feet rolling walker with a straight cane.  Max assist for activities of daily  living.  PHYSICAL EXAMINATION: VITAL SIGNS:  Blood pressure 146/77, pulse 67, temperature 98, respirations 15. GENERAL:  Alert male in no acute distress. HEENT:  EOMs intact. NECK:  Supple, nontender, no JVD. CARDIOVASCULAR:  Rate controlled. ABDOMEN:  Soft, nontender, good bowel sounds. LUNGS:  Clear to auscultation.  REHABILITATION HOSPITAL COURSE:  The patient makes good eye contact with examiner. Limited overall medical historian, follows simple commands.  Muscle strength grossly graded 4/5, limited by bradykinesia and rigidity.  REHABILITATION HOSPITAL COURSE:  The patient was admitted to inpatient rehabilitation services.  Therapies initiated on a 3-hour daily basis, consisting of physical therapy, occupational therapy and rehabilitation nursing.  The following issues were  addressed during patient's rehabilitation stay:  Pertaining to the patient's debilitation related to Parkinson's disease, he continued to do well with overall mobility.  His Sinemet had been adjusted.  He would follow up outpatient with neurology services, Dr. Carles Collet.    Mood stabilization with Aricept and Klonopin, again attending therapies and cooperative.    History of aortic stenosis with TAVR procedure.  He remained on aspirin therapy.  No chest pain or shortness of breath.    Overactive bladder.  Doing well.  Routine toileting.  Maintained on Myrbetriq 50 mg p.o. daily.  The patient received weekly collaborative interdisciplinary team conferences to discuss estimated length of stay, family teaching, any barriers to discharge.  He could ambulate up to 300 feet contact guard assist with rolling walker with verbal cues.   Stand pivot rolling walker, contact guard.  Contact guard throughout his exercise sessions.  Gathered belongings for activities of daily  living and homemaking.  He was able to practice stand pivot transfers, rolling walker, minimal guard assist from a  low sofa.  Also complete simulated walk-in  shower transfers handheld assist.  Full family teaching was completed with planned discharge to home.  DISCHARGE MEDICATIONS:  Included aspirin 81 mg p.o. daily, Sinemet 25/100 mg 2 tablets q.i.d., vitamin D 1000 mg p.o. at bedtime, Klonopin 0.5 mg p.o. at bedtime, Aricept 10 mg p.o. at bedtime, Myrbetriq 50 mg p.o. daily, MiraLax daily, hold for loose  stools.  Tylenol as needed.  DIET:  Regular.  FOLLOWUP:  The patient would follow up with Dr. Delice Lesch at the outpatient rehab service office as directed; Dr. Alonza Bogus, neurology services, call for appointment; [facility] Physician medical management.  SPECIAL INSTRUCTIONS:  No driving.  AN/NUANCE Y:03/31/3566 T:04/29/2018 JOB:[id number]  Patient seen and examined by me on day of discharge. Delice Lesch, MD, ABPMR

## 2018-04-29 NOTE — Progress Notes (Signed)
Occupational Therapy Discharge Summary  Patient Details  Name: [name]. MRN: [id number] Date of Birth: February 06, 1941     Patient has met 28 of 13 long term goals due to improved activity tolerance, improved balance, postural control, ability to compensate for deficits, improved awareness and improved coordination.    Patient to discharge at overall Supervision level.  Patient's care partner is independent to provide the necessary physical and cognitive assistance at discharge.    Reasons goals not met: n/a  Recommendation:  Patient will benefit from ongoing skilled OT services in home health setting to continue to advance functional skills in the area of BADL.  Equipment: No equipment provided  Reasons for discharge: treatment goals met  Patient/family agrees with progress made and goals achieved: Yes  OT Discharge ADL ADL Eating: Set up Where Assessed-Eating: Wheelchair Grooming: Setup Where Assessed-Grooming: Sitting at sink Upper Body Bathing: Supervision/safety Where Assessed-Upper Body Bathing: Shower Lower Body Bathing: Supervision/safety Where Assessed-Lower Body Bathing: Shower Upper Body Dressing: Supervision/safety Where Assessed-Upper Body Dressing: Chair Lower Body Dressing: Supervision/safety Where Assessed-Lower Body Dressing: Chair Toileting: Supervision/safety Where Assessed-Toileting: Glass blower/designer: Close supervision Toilet Transfer Method: Counselling psychologist: Grab bars, Raised toilet seat Tub/Shower Transfer: Moderate assistance Tub/Shower Transfer Method: Optometrist: Walk in Retail buyer: Close supervision Social research officer, government Method: Heritage manager: Civil engineer, contracting with back Vision Baseline Vision/History: Wears glasses Wears Glasses: At all times Vision Assessment?: No apparent visual deficits Perception  Perception: Within Functional  Limits Praxis Praxis: Intact Cognition Overall Cognitive Status: History of cognitive impairments - at baseline Sensation Sensation Light Touch: Appears Intact Hot/Cold: Appears Intact Proprioception: Appears Intact Stereognosis: Not tested Coordination Gross Motor Movements are Fluid and Coordinated: No Fine Motor Movements are Fluid and Coordinated: No Coordination and Movement Description: Minimal Parkinsonian tremors and festination w/ volitional movement and functional use.  He is able to fasten the buttons on his shirt with extra time. Motor   hx of Parkinson Mobility    close S with RW for ADL transfers Trunk/Postural Assessment  Postural Control Postural Control: Deficits on evaluation(posterior lean bias)  Balance Static Sitting Balance Static Sitting - Level of Assistance: 7: Independent Dynamic Sitting Balance Dynamic Sitting - Level of Assistance: 5: Stand by assistance Static Standing Balance Static Standing - Level of Assistance: 5: Stand by assistance Dynamic Standing Balance Dynamic Standing - Level of Assistance: 4: Min assist Extremity/Trunk Assessment RUE Assessment Active Range of Motion (AROM) Comments: AROM shoulder flexion 0-130 degrees bilaterally.  All other joints AROM WFLS with slight tremor noted and occasional jerking or arms.  Strength throughout 4/5 LUE Assessment Active Range of Motion (AROM) Comments: AROM shoulder flexion 0-130 degrees bilaterally.  All other joints AROM WFLS with slight tremor noted and occasional jerking or arms.  Strength throughout 4/5   [place] 04/29/2018, 12:43 PM

## 2018-04-29 NOTE — Discharge Summary (Signed)
Discharge summary job [phone]

## 2018-04-29 NOTE — Plan of Care (Signed)
  Problem: RH BOWEL ELIMINATION Goal: RH STG MANAGE BOWEL WITH ASSISTANCE Description STG Manage Bowel with Assistance. mod I  Outcome: Completed/Met Goal: RH STG MANAGE BOWEL W/MEDICATION W/ASSISTANCE Description STG Manage Bowel with Medication with Assistance. mod  Outcome: Completed/Met   Problem: RH BLADDER ELIMINATION Goal: RH STG MANAGE BLADDER WITH ASSISTANCE Description STG Manage Bladder With Assistance. Mod I  Outcome: Completed/Met   Problem: RH SKIN INTEGRITY Goal: RH STG SKIN FREE OF INFECTION/BREAKDOWN Description Skin to remain free of breakdown and infection on rehab with mod assist  Outcome: Completed/Met   Problem: RH SAFETY Goal: RH STG ADHERE TO SAFETY PRECAUTIONS W/ASSISTANCE/DEVICE Description STG Adhere to Safety Precautions With Assistance/Device. Mod assist  Outcome: Completed/Met Goal: RH STG DECREASED RISK OF FALL WITH ASSISTANCE Description STG Decreased Risk of Fall With Assistance. Mod assist  Outcome: Completed/Met

## 2018-04-30 MED ORDER — ACETAMINOPHEN 325 MG PO TABS: 650.0000 mg | ORAL_TABLET | Freq: Four times a day (QID) | ORAL | Status: DC | PRN

## 2018-04-30 MED ORDER — DONEPEZIL HCL 10 MG PO TABS: 10.0000 mg | ORAL_TABLET | Freq: Every day | ORAL | 1 refills | Status: DC

## 2018-04-30 MED ORDER — CARBIDOPA-LEVODOPA 25-100 MG PO TABS: 2.0000 | ORAL_TABLET | Freq: Four times a day (QID) | ORAL | 1 refills | Status: DC

## 2018-04-30 MED ORDER — CLONAZEPAM 0.5 MG PO TABS: 0.5000 mg | ORAL_TABLET | Freq: Every day | ORAL | 1 refills | Status: DC

## 2018-04-30 MED ORDER — VITAMIN D 1000 UNITS PO TABS: 1000.0000 [IU] | ORAL_TABLET | Freq: Every day | ORAL | 1 refills | Status: AC

## 2018-04-30 MED ORDER — POLYETHYLENE GLYCOL 3350 17 G PO PACK: 17.0000 g | PACK | Freq: Every day | ORAL | 0 refills | Status: DC

## 2018-04-30 MED ORDER — MYRBETRIQ 50 MG PO TB24: 50.0000 mg | ORAL_TABLET | Freq: Every day | ORAL | 11 refills | Status: AC

## 2018-04-30 NOTE — Progress Notes (Signed)
Social Work  Discharge Note  The overall goal for the admission was met for:   Discharge location: Yes - home with daughter and family providing 24/7 supervision/ assist  Length of Stay: Yes - 15 days  Discharge activity level: Yes - supervision/ CGA  Home/community participation: Yes  Services provided included: MD, RD, PT, OT, SLP, RN, TR, Pharmacy and [place]: Medicare and Private Insurance: [place]  Follow-up services arranged: Home Health: PT, OT via [place], DME: tub seat via [place] and Patient/Family has no preference for HH/DME agencies  Comments (or additional information):  Patient/Family verbalized understanding of follow-up arrangements: Yes  Individual responsible for coordination of the follow-up plan: pt/daughter  Confirmed correct DME delivered: Lennart Pall 04/30/2018    Meilech Virts

## 2018-04-30 NOTE — Progress Notes (Signed)
Patient discharged home.  Left floor via wheelchair, escorted by nursing staff and daughter.  All patient belongings sent with patient including DME and prescriptions.  Patient appears to be in no immediate distress at this time.  Brita Romp, RN

## 2018-05-01 ENCOUNTER — Telehealth: Payer: Self-pay | Admitting: Neurology

## 2018-05-01 ENCOUNTER — Telehealth: Payer: Self-pay

## 2018-05-01 NOTE — Telephone Encounter (Signed)
Patient daughter called and wants to speak to [facility] about patient release from the hospital and his eye lid. Also she wanted me to get patient in ASAP but we don't have anything befroe his Feb appt please call

## 2018-05-01 NOTE — Telephone Encounter (Signed)
Spoke with patient's daughter. She states patient came home from rehab yesterday and was doing worse. She manages medication. She started working off his old pill Environmental education officer and realized that because the medication looked so similar, she had mixed up his Carbidopa Levodopa and his Clonazepam. She understands now that a lot of his issues were probably due to medication mix up. She has someone always check over her and not sure how this happened, but wanted to make sure we were aware. She has fixed this and he is on the correct medications now. He will keep scheduled follow up and she will keep [place] updated on his condition in the interim.  Dr. Carles Collet Juluis Rainier.

## 2018-05-01 NOTE — Telephone Encounter (Signed)
Transitional Care call-Deborah-daughter of patient    1. Are you/is patient experiencing any problems since coming home? No Are there any questions regarding any aspect of care? No 2. Are there any questions regarding medications administration/dosing? No Are meds being taken as prescribed? Yes Patient should review meds with caller to confirm 3. Have there been any falls? No 4. Has Home Health been to the house and/or have they contacted you? Yes If not, have you tried to contact them? Can we help you contact them? 5. Are bowels and bladder emptying properly? No Are there any unexpected incontinence issues? Yes If applicable, is patient following bowel/bladder programs? 6. Any fevers, problems with breathing, unexpected pain? No 7. Are there any skin problems or new areas of breakdown? No 8. Has the patient/family member arranged specialty MD follow up (ie cardiology/neurology/renal/surgical/etc)? Yes Can we help arrange? 9. Does the patient need any other services or support that we can help arrange? No 10. Are caregivers following through as expected in assisting the patient? Yes 11. Has the patient quit smoking, drinking alcohol, or using drugs as recommended? Yes  Appointment time, arrive time 12:40 FOR 1:00 WITH Dr. Posey Pronto on 05/23/18 [place]

## 2018-05-02 DIAGNOSIS — I350 Nonrheumatic aortic (valve) stenosis: Secondary | ICD-10-CM | POA: Diagnosis not present

## 2018-05-02 DIAGNOSIS — E119 Type 2 diabetes mellitus without complications: Secondary | ICD-10-CM | POA: Diagnosis not present

## 2018-05-02 DIAGNOSIS — I10 Essential (primary) hypertension: Secondary | ICD-10-CM | POA: Diagnosis not present

## 2018-05-02 DIAGNOSIS — M6281 Muscle weakness (generalized): Secondary | ICD-10-CM | POA: Diagnosis not present

## 2018-05-02 DIAGNOSIS — Z952 Presence of prosthetic heart valve: Secondary | ICD-10-CM | POA: Diagnosis not present

## 2018-05-02 DIAGNOSIS — Z7982 Long term (current) use of aspirin: Secondary | ICD-10-CM | POA: Diagnosis not present

## 2018-05-02 DIAGNOSIS — G20 Parkinson's disease: Secondary | ICD-10-CM | POA: Diagnosis not present

## 2018-05-02 DIAGNOSIS — Z79899 Other long term (current) drug therapy: Secondary | ICD-10-CM | POA: Diagnosis not present

## 2018-05-02 DIAGNOSIS — G3183 Dementia with Lewy bodies: Secondary | ICD-10-CM | POA: Diagnosis not present

## 2018-05-02 DIAGNOSIS — K5901 Slow transit constipation: Secondary | ICD-10-CM | POA: Diagnosis not present

## 2018-05-02 NOTE — Telephone Encounter (Signed)
Noted. Thanks for update 

## 2018-05-03 DIAGNOSIS — G3183 Dementia with Lewy bodies: Secondary | ICD-10-CM | POA: Diagnosis not present

## 2018-05-03 DIAGNOSIS — M6281 Muscle weakness (generalized): Secondary | ICD-10-CM | POA: Diagnosis not present

## 2018-05-03 DIAGNOSIS — I10 Essential (primary) hypertension: Secondary | ICD-10-CM | POA: Diagnosis not present

## 2018-05-03 DIAGNOSIS — I350 Nonrheumatic aortic (valve) stenosis: Secondary | ICD-10-CM | POA: Diagnosis not present

## 2018-05-03 DIAGNOSIS — G20 Parkinson's disease: Secondary | ICD-10-CM | POA: Diagnosis not present

## 2018-05-03 DIAGNOSIS — E119 Type 2 diabetes mellitus without complications: Secondary | ICD-10-CM | POA: Diagnosis not present

## 2018-05-06 DIAGNOSIS — I10 Essential (primary) hypertension: Secondary | ICD-10-CM | POA: Diagnosis not present

## 2018-05-06 DIAGNOSIS — G20 Parkinson's disease: Secondary | ICD-10-CM | POA: Diagnosis not present

## 2018-05-06 DIAGNOSIS — M6281 Muscle weakness (generalized): Secondary | ICD-10-CM | POA: Diagnosis not present

## 2018-05-06 DIAGNOSIS — I350 Nonrheumatic aortic (valve) stenosis: Secondary | ICD-10-CM | POA: Diagnosis not present

## 2018-05-06 DIAGNOSIS — E119 Type 2 diabetes mellitus without complications: Secondary | ICD-10-CM | POA: Diagnosis not present

## 2018-05-06 DIAGNOSIS — G3183 Dementia with Lewy bodies: Secondary | ICD-10-CM | POA: Diagnosis not present

## 2018-05-07 DIAGNOSIS — M6281 Muscle weakness (generalized): Secondary | ICD-10-CM | POA: Diagnosis not present

## 2018-05-07 DIAGNOSIS — I350 Nonrheumatic aortic (valve) stenosis: Secondary | ICD-10-CM | POA: Diagnosis not present

## 2018-05-07 DIAGNOSIS — I10 Essential (primary) hypertension: Secondary | ICD-10-CM | POA: Diagnosis not present

## 2018-05-07 DIAGNOSIS — E119 Type 2 diabetes mellitus without complications: Secondary | ICD-10-CM | POA: Diagnosis not present

## 2018-05-07 DIAGNOSIS — G3183 Dementia with Lewy bodies: Secondary | ICD-10-CM | POA: Diagnosis not present

## 2018-05-07 DIAGNOSIS — G20 Parkinson's disease: Secondary | ICD-10-CM | POA: Diagnosis not present

## 2018-05-10 DIAGNOSIS — G20 Parkinson's disease: Secondary | ICD-10-CM | POA: Diagnosis not present

## 2018-05-10 DIAGNOSIS — M6281 Muscle weakness (generalized): Secondary | ICD-10-CM | POA: Diagnosis not present

## 2018-05-10 DIAGNOSIS — I10 Essential (primary) hypertension: Secondary | ICD-10-CM | POA: Diagnosis not present

## 2018-05-10 DIAGNOSIS — E119 Type 2 diabetes mellitus without complications: Secondary | ICD-10-CM | POA: Diagnosis not present

## 2018-05-10 DIAGNOSIS — I350 Nonrheumatic aortic (valve) stenosis: Secondary | ICD-10-CM | POA: Diagnosis not present

## 2018-05-10 DIAGNOSIS — G3183 Dementia with Lewy bodies: Secondary | ICD-10-CM | POA: Diagnosis not present

## 2018-05-13 DIAGNOSIS — M6281 Muscle weakness (generalized): Secondary | ICD-10-CM | POA: Diagnosis not present

## 2018-05-13 DIAGNOSIS — G3183 Dementia with Lewy bodies: Secondary | ICD-10-CM | POA: Diagnosis not present

## 2018-05-13 DIAGNOSIS — E119 Type 2 diabetes mellitus without complications: Secondary | ICD-10-CM | POA: Diagnosis not present

## 2018-05-13 DIAGNOSIS — I10 Essential (primary) hypertension: Secondary | ICD-10-CM | POA: Diagnosis not present

## 2018-05-13 DIAGNOSIS — G20 Parkinson's disease: Secondary | ICD-10-CM | POA: Diagnosis not present

## 2018-05-13 DIAGNOSIS — I350 Nonrheumatic aortic (valve) stenosis: Secondary | ICD-10-CM | POA: Diagnosis not present

## 2018-05-16 DIAGNOSIS — I10 Essential (primary) hypertension: Secondary | ICD-10-CM | POA: Diagnosis not present

## 2018-05-16 DIAGNOSIS — I350 Nonrheumatic aortic (valve) stenosis: Secondary | ICD-10-CM | POA: Diagnosis not present

## 2018-05-16 DIAGNOSIS — G20 Parkinson's disease: Secondary | ICD-10-CM | POA: Diagnosis not present

## 2018-05-16 DIAGNOSIS — M6281 Muscle weakness (generalized): Secondary | ICD-10-CM | POA: Diagnosis not present

## 2018-05-16 DIAGNOSIS — E119 Type 2 diabetes mellitus without complications: Secondary | ICD-10-CM | POA: Diagnosis not present

## 2018-05-16 DIAGNOSIS — G3183 Dementia with Lewy bodies: Secondary | ICD-10-CM | POA: Diagnosis not present

## 2018-05-20 DIAGNOSIS — G3183 Dementia with Lewy bodies: Secondary | ICD-10-CM | POA: Diagnosis not present

## 2018-05-20 DIAGNOSIS — M6281 Muscle weakness (generalized): Secondary | ICD-10-CM | POA: Diagnosis not present

## 2018-05-20 DIAGNOSIS — I350 Nonrheumatic aortic (valve) stenosis: Secondary | ICD-10-CM | POA: Diagnosis not present

## 2018-05-20 DIAGNOSIS — I10 Essential (primary) hypertension: Secondary | ICD-10-CM | POA: Diagnosis not present

## 2018-05-20 DIAGNOSIS — E119 Type 2 diabetes mellitus without complications: Secondary | ICD-10-CM | POA: Diagnosis not present

## 2018-05-20 DIAGNOSIS — G20 Parkinson's disease: Secondary | ICD-10-CM | POA: Diagnosis not present

## 2018-05-21 DIAGNOSIS — M6281 Muscle weakness (generalized): Secondary | ICD-10-CM | POA: Diagnosis not present

## 2018-05-21 DIAGNOSIS — G3183 Dementia with Lewy bodies: Secondary | ICD-10-CM | POA: Diagnosis not present

## 2018-05-21 DIAGNOSIS — I350 Nonrheumatic aortic (valve) stenosis: Secondary | ICD-10-CM | POA: Diagnosis not present

## 2018-05-21 DIAGNOSIS — G20 Parkinson's disease: Secondary | ICD-10-CM | POA: Diagnosis not present

## 2018-05-21 DIAGNOSIS — I10 Essential (primary) hypertension: Secondary | ICD-10-CM | POA: Diagnosis not present

## 2018-05-21 DIAGNOSIS — E119 Type 2 diabetes mellitus without complications: Secondary | ICD-10-CM | POA: Diagnosis not present

## 2018-05-22 DIAGNOSIS — E119 Type 2 diabetes mellitus without complications: Secondary | ICD-10-CM | POA: Diagnosis not present

## 2018-05-22 DIAGNOSIS — G20 Parkinson's disease: Secondary | ICD-10-CM | POA: Diagnosis not present

## 2018-05-22 DIAGNOSIS — G3183 Dementia with Lewy bodies: Secondary | ICD-10-CM | POA: Diagnosis not present

## 2018-05-22 DIAGNOSIS — I10 Essential (primary) hypertension: Secondary | ICD-10-CM | POA: Diagnosis not present

## 2018-05-22 DIAGNOSIS — M6281 Muscle weakness (generalized): Secondary | ICD-10-CM | POA: Diagnosis not present

## 2018-05-22 DIAGNOSIS — I350 Nonrheumatic aortic (valve) stenosis: Secondary | ICD-10-CM | POA: Diagnosis not present

## 2018-05-23 ENCOUNTER — Encounter: Payer: Self-pay | Admitting: Physical Medicine & Rehabilitation

## 2018-05-23 ENCOUNTER — Encounter: Payer: Medicare Other | Attending: Physical Medicine & Rehabilitation | Admitting: Physical Medicine & Rehabilitation

## 2018-05-23 VITALS — BP 129/75 | HR 65 | Ht 66.5 in | Wt 130.0 lb

## 2018-05-23 DIAGNOSIS — N3281 Overactive bladder: Secondary | ICD-10-CM | POA: Diagnosis not present

## 2018-05-23 DIAGNOSIS — D696 Thrombocytopenia, unspecified: Secondary | ICD-10-CM | POA: Diagnosis not present

## 2018-05-23 DIAGNOSIS — G20 Parkinson's disease: Secondary | ICD-10-CM | POA: Diagnosis not present

## 2018-05-23 DIAGNOSIS — R5381 Other malaise: Secondary | ICD-10-CM

## 2018-05-23 NOTE — Progress Notes (Signed)
Subjective:    Patient ID: [name]., male    DOB: 1941/03/23, 78 y.o.   MRN: [id number]  HPI 78 year old right-handed male with history of headache, aortic stenosis, status post TAVR procedure, Parkinson's disease presents for hospital follow-up after receiving CIR for debility secondary to Parkinson's and multiple medical reasons.  DATE OF DISCHARGE: 04/30/2018   Daughter supplements history.  At discharge, he was instructed to followed up with Dr. Carles Collet, who he sees next month.  Daughter is planning to follow up with PCP in a couple months. Sleep remains variable.  Denies falls.  Therapies: 3/week DME: Shower chair, bedside commode Mobility: Walker at home, rollator in community  Pain Inventory Average Pain 2 Pain Right Now 2 My pain is intermittent and aching  In the last 24 hours, has pain interfered with the following? General activity 2 Relation with others 1 Enjoyment of life 3 What TIME of day is your pain at its worst? evening Sleep (in general) Fair  Pain is worse with: standing Pain improves with: rest, heat/ice and medication Relief from Meds: 7  Mobility use a walker ability to climb steps?  yes do you drive?  no  Function retired  Neuro/Psych bladder control problems weakness trouble walking confusion  Prior Studies Any changes since last visit?  no  Physicians involved in your care Any changes since last visit?  no   Family History  Problem Relation Age of Onset  . Arthritis/Rheumatoid Mother   . Prostate cancer Father   . Anemia Daughter   . Asthma Daughter   . Diabetes Daughter   . Hypertension Daughter   . Kidney disease Daughter   . Cancer Son   . Breast cancer Sister   . Heart attack Neg Hx   . Stroke Neg Hx    Social History   Socioeconomic History  . Marital status: Widowed    Spouse name: Not on file  . Number of children: Not on file  . Years of education: Not on file  . Highest education level: Not on file    Occupational History  . Not on file  Social Needs  . Financial resource strain: Not on file  . Food insecurity:    Worry: Not on file    Inability: Not on file  . Transportation needs:    Medical: Not on file    Non-medical: Not on file  Tobacco Use  . Smoking status: Never Smoker  . Smokeless tobacco: Never Used  Substance and Sexual Activity  . Alcohol use: No  . Drug use: No  . Sexual activity: Not on file  Lifestyle  . Physical activity:    Days per week: Not on file    Minutes per session: Not on file  . Stress: Not on file  Relationships  . Social connections:    Talks on phone: Not on file    Gets together: Not on file    Attends religious service: Not on file    Active member of club or organization: Not on file    Attends meetings of clubs or organizations: Not on file    Relationship status: Not on file  Other Topics Concern  . Not on file  Social History Narrative  . Not on file   Past Surgical History:  Procedure Laterality Date  . CARDIAC CATHETERIZATION  10/09/13  . cataract  2008   bilateral  . COLONOSCOPY    . CYSTOSCOPY/RETROGRADE/URETEROSCOPY  05/29/2011   Procedure: CYSTOSCOPY/RETROGRADE/URETEROSCOPY;  Surgeon:  Malka So, MD;  Location: [facility];  Service: Urology;  Laterality: Left;  no stent   . EYE SURGERY Bilateral    /w IOL  . INGUINAL HERNIA REPAIR Bilateral 03/30/2014   Procedure: OPEN BILATERAL INGUINAL HERNIA REPAIR WITH MESH;  Surgeon: Coralie Keens, MD;  Location: [place];  Service: General;  Laterality: Bilateral;  . INSERTION OF MESH Bilateral 03/30/2014   Procedure: INSERTION OF MESH;  Surgeon: Coralie Keens, MD;  Location: [place] [place];  Service: General;  Laterality: Bilateral;  . INTRAOPERATIVE TRANSESOPHAGEAL ECHOCARDIOGRAM N/A 11/18/2013   Procedure: INTRAOPERATIVE TRANSESOPHAGEAL ECHOCARDIOGRAM;  Surgeon: Sherren Mocha, MD;  Location[facility];  Service: Open Heart Surgery;  Laterality: N/A;  . LEFT AND RIGHT HEART CATHETERIZATION  WITH CORONARY ANGIOGRAM N/A 10/09/2013   Procedure: LEFT AND RIGHT HEART CATHETERIZATION WITH CORONARY ANGIOGRAM;  Surgeon: Blane Ohara, MD;  Location: [facility] CATH LAB;  Service: Cardiovascular;  Laterality: N/A;  . TRANSCATHETER AORTIC VALVE REPLACEMENT, TRANSFEMORAL N/A 11/18/2013   Procedure: TRANSCATHETER AORTIC VALVE REPLACEMENT, TRANSFEMORAL;  Surgeon: Sherren Mocha, MD;  Location: [place];  Service: Open Heart Surgery;  Laterality: N/A;   Past Medical History:  Diagnosis Date  . Anxiety   . Aortic stenosis   . Arthritis    left hip  . Cancer ([place])    basal cell- facial?  . Depression   . Diabetes mellitus without complication ([place])   . GERD (gastroesophageal reflux disease)   . Headache(784.0)   . Heart murmur   . Hernia, inguinal, bilateral, recurrent   . Hypertension   . Kidney stones   . Parkinson disease ([place])   . Parkinson's disease (tremor, stiffness, slow motion, unstable posture) ([place]) 12/11/2006  . PONV (postoperative nausea and vomiting)   . Restless leg syndrome   . S/P TAVR (transcatheter aortic valve replacement) 11/18/2013   26 mm Edwards Sapien XT transcatheter heart valve placed via open right transfemoral approach  . TIA (transient ischemic attack) 02/08/2007   BP 129/75   Pulse 65   Ht 5' 6.5" (1.689 m)   Wt 130 lb (59 kg)   SpO2 98%   BMI 20.67 kg/m   Opioid Risk Score:   Fall Risk Score:  `1  Depression screen PHQ 2/9  No flowsheet data found.   Review of Systems  Constitutional: Negative.   HENT: Negative.   Eyes: Negative.   Respiratory: Negative.   Cardiovascular: Negative.   Gastrointestinal: Negative.   Endocrine: Negative.   Genitourinary: Positive for difficulty urinating.  Musculoskeletal: Positive for gait problem.  Skin: Negative.   Allergic/Immunologic: Negative.   Neurological: Positive for weakness.  Hematological: Negative.   Psychiatric/Behavioral: Positive for confusion.  All other systems reviewed and are negative.       Objective:   Physical Exam Constitutional: No distress . Vital signs reviewed. HENT: Normocephalic.  Atraumatic. Eyes: EOMI. No discharge. Cardiovascular: RRR. No JVD. Respiratory: CTA bilaterally. Normal effort. GI: BS +. Non-distended. Musc: No edema or tenderness in extremities. Neurological:  Alert Dysarthria Follow simple commands. Motor: Grossly 4+/5 throughout, limited by bradykinesia and rigidity Skin: Skin is warm and dry.  Psychiatric: smiling, pleasant    Assessment & Plan:  78 year old right-handed male with history of headache, aortic stenosis, status post TAVR procedure, Parkinson's disease presents for hospital follow-up after receiving CIR for debility secondary to Parkinson's and multiple medical reasons.  1. Debility secondary to  Parkinson's disease/multi-medical             Cont therapies  2. Mood:  Cont Klonopin 0.5 mg daily at bedtime, Aricept 10 mg daily at bedtime             3. Parkinson's disease.   Cont meds per Neuro  4. Overactive bladder.   Cont meds per Urology  5.  Thrombocytopenia             Platelets 113 on 12/9             No repeat labs have been drawn, will order  Meds reviewed Referrals reviewed All questions answered

## 2018-05-24 ENCOUNTER — Telehealth: Payer: Self-pay | Admitting: Physical Medicine & Rehabilitation

## 2018-05-24 DIAGNOSIS — E119 Type 2 diabetes mellitus without complications: Secondary | ICD-10-CM | POA: Diagnosis not present

## 2018-05-24 DIAGNOSIS — I350 Nonrheumatic aortic (valve) stenosis: Secondary | ICD-10-CM | POA: Diagnosis not present

## 2018-05-24 DIAGNOSIS — G20 Parkinson's disease: Secondary | ICD-10-CM | POA: Diagnosis not present

## 2018-05-24 DIAGNOSIS — M6281 Muscle weakness (generalized): Secondary | ICD-10-CM | POA: Diagnosis not present

## 2018-05-24 DIAGNOSIS — G3183 Dementia with Lewy bodies: Secondary | ICD-10-CM | POA: Diagnosis not present

## 2018-05-24 DIAGNOSIS — I10 Essential (primary) hypertension: Secondary | ICD-10-CM | POA: Diagnosis not present

## 2018-05-24 LAB — CBC WITH DIFFERENTIAL/PLATELET
Basophils Absolute: 0 10*3/uL (ref 0.0–0.2)
Basos: 0 %
EOS (ABSOLUTE): 0.2 10*3/uL (ref 0.0–0.4)
Eos: 3 %
Hematocrit: 36.4 % — ABNORMAL LOW (ref 37.5–51.0)
Hemoglobin: 12.1 g/dL — ABNORMAL LOW (ref 13.0–17.7)
Immature Grans (Abs): 0 10*3/uL (ref 0.0–0.1)
Immature Granulocytes: 1 %
Lymphocytes Absolute: 1 10*3/uL (ref 0.7–3.1)
Lymphs: 18 %
MCH: 28.9 pg (ref 26.6–33.0)
MCHC: 33.2 g/dL (ref 31.5–35.7)
MCV: 87 fL (ref 79–97)
Monocytes Absolute: 0.4 10*3/uL (ref 0.1–0.9)
Monocytes: 8 %
NEUTROS PCT: 70 %
Neutrophils Absolute: 3.8 10*3/uL (ref 1.4–7.0)
PLATELETS: 118 10*3/uL — AB (ref 150–450)
RBC: 4.18 x10E6/uL (ref 4.14–5.80)
RDW: 12.7 % (ref 11.6–15.4)
WBC: 5.4 10*3/uL (ref 3.4–10.8)

## 2018-05-24 NOTE — Telephone Encounter (Signed)
Attempted to call number in chart, but no answer.  Left voicemail requesting call back to discuss labs.

## 2018-05-27 DIAGNOSIS — G20 Parkinson's disease: Secondary | ICD-10-CM | POA: Diagnosis not present

## 2018-05-27 DIAGNOSIS — E119 Type 2 diabetes mellitus without complications: Secondary | ICD-10-CM | POA: Diagnosis not present

## 2018-05-27 DIAGNOSIS — G3183 Dementia with Lewy bodies: Secondary | ICD-10-CM | POA: Diagnosis not present

## 2018-05-27 DIAGNOSIS — I350 Nonrheumatic aortic (valve) stenosis: Secondary | ICD-10-CM | POA: Diagnosis not present

## 2018-05-27 DIAGNOSIS — I10 Essential (primary) hypertension: Secondary | ICD-10-CM | POA: Diagnosis not present

## 2018-05-27 DIAGNOSIS — M6281 Muscle weakness (generalized): Secondary | ICD-10-CM | POA: Diagnosis not present

## 2018-05-28 DIAGNOSIS — I10 Essential (primary) hypertension: Secondary | ICD-10-CM | POA: Diagnosis not present

## 2018-05-28 DIAGNOSIS — E119 Type 2 diabetes mellitus without complications: Secondary | ICD-10-CM | POA: Diagnosis not present

## 2018-05-28 DIAGNOSIS — I350 Nonrheumatic aortic (valve) stenosis: Secondary | ICD-10-CM | POA: Diagnosis not present

## 2018-05-28 DIAGNOSIS — G3183 Dementia with Lewy bodies: Secondary | ICD-10-CM | POA: Diagnosis not present

## 2018-05-28 DIAGNOSIS — M6281 Muscle weakness (generalized): Secondary | ICD-10-CM | POA: Diagnosis not present

## 2018-05-28 DIAGNOSIS — G20 Parkinson's disease: Secondary | ICD-10-CM | POA: Diagnosis not present

## 2018-05-30 DIAGNOSIS — I350 Nonrheumatic aortic (valve) stenosis: Secondary | ICD-10-CM | POA: Diagnosis not present

## 2018-05-30 DIAGNOSIS — E119 Type 2 diabetes mellitus without complications: Secondary | ICD-10-CM | POA: Diagnosis not present

## 2018-05-30 DIAGNOSIS — I10 Essential (primary) hypertension: Secondary | ICD-10-CM | POA: Diagnosis not present

## 2018-05-30 DIAGNOSIS — G20 Parkinson's disease: Secondary | ICD-10-CM | POA: Diagnosis not present

## 2018-05-30 DIAGNOSIS — M6281 Muscle weakness (generalized): Secondary | ICD-10-CM | POA: Diagnosis not present

## 2018-05-30 DIAGNOSIS — G3183 Dementia with Lewy bodies: Secondary | ICD-10-CM | POA: Diagnosis not present

## 2018-05-31 DIAGNOSIS — E119 Type 2 diabetes mellitus without complications: Secondary | ICD-10-CM | POA: Diagnosis not present

## 2018-05-31 DIAGNOSIS — M6281 Muscle weakness (generalized): Secondary | ICD-10-CM | POA: Diagnosis not present

## 2018-05-31 DIAGNOSIS — I350 Nonrheumatic aortic (valve) stenosis: Secondary | ICD-10-CM | POA: Diagnosis not present

## 2018-05-31 DIAGNOSIS — I10 Essential (primary) hypertension: Secondary | ICD-10-CM | POA: Diagnosis not present

## 2018-05-31 DIAGNOSIS — G20 Parkinson's disease: Secondary | ICD-10-CM | POA: Diagnosis not present

## 2018-05-31 DIAGNOSIS — G3183 Dementia with Lewy bodies: Secondary | ICD-10-CM | POA: Diagnosis not present

## 2018-06-05 DIAGNOSIS — I10 Essential (primary) hypertension: Secondary | ICD-10-CM | POA: Diagnosis not present

## 2018-06-05 DIAGNOSIS — G20 Parkinson's disease: Secondary | ICD-10-CM | POA: Diagnosis not present

## 2018-06-05 DIAGNOSIS — G3183 Dementia with Lewy bodies: Secondary | ICD-10-CM | POA: Diagnosis not present

## 2018-06-05 DIAGNOSIS — E119 Type 2 diabetes mellitus without complications: Secondary | ICD-10-CM | POA: Diagnosis not present

## 2018-06-05 DIAGNOSIS — M6281 Muscle weakness (generalized): Secondary | ICD-10-CM | POA: Diagnosis not present

## 2018-06-05 DIAGNOSIS — I350 Nonrheumatic aortic (valve) stenosis: Secondary | ICD-10-CM | POA: Diagnosis not present

## 2018-06-10 DIAGNOSIS — G20 Parkinson's disease: Secondary | ICD-10-CM | POA: Diagnosis not present

## 2018-06-10 DIAGNOSIS — I350 Nonrheumatic aortic (valve) stenosis: Secondary | ICD-10-CM | POA: Diagnosis not present

## 2018-06-10 DIAGNOSIS — E119 Type 2 diabetes mellitus without complications: Secondary | ICD-10-CM | POA: Diagnosis not present

## 2018-06-10 DIAGNOSIS — G3183 Dementia with Lewy bodies: Secondary | ICD-10-CM | POA: Diagnosis not present

## 2018-06-10 DIAGNOSIS — I10 Essential (primary) hypertension: Secondary | ICD-10-CM | POA: Diagnosis not present

## 2018-06-10 DIAGNOSIS — M6281 Muscle weakness (generalized): Secondary | ICD-10-CM | POA: Diagnosis not present

## 2018-06-12 DIAGNOSIS — G3183 Dementia with Lewy bodies: Secondary | ICD-10-CM | POA: Diagnosis not present

## 2018-06-12 DIAGNOSIS — I350 Nonrheumatic aortic (valve) stenosis: Secondary | ICD-10-CM | POA: Diagnosis not present

## 2018-06-12 DIAGNOSIS — M6281 Muscle weakness (generalized): Secondary | ICD-10-CM | POA: Diagnosis not present

## 2018-06-12 DIAGNOSIS — G20 Parkinson's disease: Secondary | ICD-10-CM | POA: Diagnosis not present

## 2018-06-12 DIAGNOSIS — E119 Type 2 diabetes mellitus without complications: Secondary | ICD-10-CM | POA: Diagnosis not present

## 2018-06-12 DIAGNOSIS — I10 Essential (primary) hypertension: Secondary | ICD-10-CM | POA: Diagnosis not present

## 2018-06-17 DIAGNOSIS — E119 Type 2 diabetes mellitus without complications: Secondary | ICD-10-CM | POA: Diagnosis not present

## 2018-06-17 DIAGNOSIS — G20 Parkinson's disease: Secondary | ICD-10-CM | POA: Diagnosis not present

## 2018-06-17 DIAGNOSIS — I10 Essential (primary) hypertension: Secondary | ICD-10-CM | POA: Diagnosis not present

## 2018-06-17 DIAGNOSIS — M6281 Muscle weakness (generalized): Secondary | ICD-10-CM | POA: Diagnosis not present

## 2018-06-17 DIAGNOSIS — G3183 Dementia with Lewy bodies: Secondary | ICD-10-CM | POA: Diagnosis not present

## 2018-06-17 DIAGNOSIS — I350 Nonrheumatic aortic (valve) stenosis: Secondary | ICD-10-CM | POA: Diagnosis not present
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
## 2018-06-20 ENCOUNTER — Ambulatory Visit: Payer: Medicare Other | Admitting: Neurology

## 2018-06-21 DIAGNOSIS — E119 Type 2 diabetes mellitus without complications: Secondary | ICD-10-CM | POA: Diagnosis not present

## 2018-06-21 DIAGNOSIS — G3183 Dementia with Lewy bodies: Secondary | ICD-10-CM | POA: Diagnosis not present

## 2018-06-21 DIAGNOSIS — I350 Nonrheumatic aortic (valve) stenosis: Secondary | ICD-10-CM | POA: Diagnosis not present

## 2018-06-21 DIAGNOSIS — M6281 Muscle weakness (generalized): Secondary | ICD-10-CM | POA: Diagnosis not present

## 2018-06-21 DIAGNOSIS — I10 Essential (primary) hypertension: Secondary | ICD-10-CM | POA: Diagnosis not present

## 2018-06-21 DIAGNOSIS — G20 Parkinson's disease: Secondary | ICD-10-CM | POA: Diagnosis not present

## 2018-06-24 DIAGNOSIS — E119 Type 2 diabetes mellitus without complications: Secondary | ICD-10-CM | POA: Diagnosis not present

## 2018-06-24 DIAGNOSIS — M6281 Muscle weakness (generalized): Secondary | ICD-10-CM | POA: Diagnosis not present

## 2018-06-24 DIAGNOSIS — G20 Parkinson's disease: Secondary | ICD-10-CM | POA: Diagnosis not present

## 2018-06-24 DIAGNOSIS — I10 Essential (primary) hypertension: Secondary | ICD-10-CM | POA: Diagnosis not present

## 2018-06-24 DIAGNOSIS — I350 Nonrheumatic aortic (valve) stenosis: Secondary | ICD-10-CM | POA: Diagnosis not present

## 2018-06-24 DIAGNOSIS — G3183 Dementia with Lewy bodies: Secondary | ICD-10-CM | POA: Diagnosis not present

## 2018-06-26 DIAGNOSIS — G3183 Dementia with Lewy bodies: Secondary | ICD-10-CM | POA: Diagnosis not present

## 2018-06-26 DIAGNOSIS — G20 Parkinson's disease: Secondary | ICD-10-CM | POA: Diagnosis not present

## 2018-06-26 DIAGNOSIS — E119 Type 2 diabetes mellitus without complications: Secondary | ICD-10-CM | POA: Diagnosis not present

## 2018-06-26 DIAGNOSIS — M6281 Muscle weakness (generalized): Secondary | ICD-10-CM | POA: Diagnosis not present

## 2018-06-26 DIAGNOSIS — I10 Essential (primary) hypertension: Secondary | ICD-10-CM | POA: Diagnosis not present

## 2018-06-26 DIAGNOSIS — I350 Nonrheumatic aortic (valve) stenosis: Secondary | ICD-10-CM | POA: Diagnosis not present

## 2018-06-27 DIAGNOSIS — G20 Parkinson's disease: Secondary | ICD-10-CM | POA: Diagnosis not present

## 2018-06-27 DIAGNOSIS — E119 Type 2 diabetes mellitus without complications: Secondary | ICD-10-CM | POA: Diagnosis not present

## 2018-06-27 DIAGNOSIS — I10 Essential (primary) hypertension: Secondary | ICD-10-CM | POA: Diagnosis not present

## 2018-06-27 DIAGNOSIS — I350 Nonrheumatic aortic (valve) stenosis: Secondary | ICD-10-CM | POA: Diagnosis not present

## 2018-06-27 DIAGNOSIS — G3183 Dementia with Lewy bodies: Secondary | ICD-10-CM | POA: Diagnosis not present

## 2018-06-27 DIAGNOSIS — M6281 Muscle weakness (generalized): Secondary | ICD-10-CM | POA: Diagnosis not present

## 2018-07-01 DIAGNOSIS — G20 Parkinson's disease: Secondary | ICD-10-CM | POA: Diagnosis not present

## 2018-07-01 DIAGNOSIS — G3183 Dementia with Lewy bodies: Secondary | ICD-10-CM | POA: Diagnosis not present

## 2018-07-01 DIAGNOSIS — I10 Essential (primary) hypertension: Secondary | ICD-10-CM | POA: Diagnosis not present

## 2018-07-01 DIAGNOSIS — R471 Dysarthria and anarthria: Secondary | ICD-10-CM | POA: Diagnosis not present

## 2018-07-01 DIAGNOSIS — Z7982 Long term (current) use of aspirin: Secondary | ICD-10-CM | POA: Diagnosis not present

## 2018-07-01 DIAGNOSIS — Z79899 Other long term (current) drug therapy: Secondary | ICD-10-CM | POA: Diagnosis not present

## 2018-07-01 DIAGNOSIS — Z952 Presence of prosthetic heart valve: Secondary | ICD-10-CM | POA: Diagnosis not present

## 2018-07-01 DIAGNOSIS — M6281 Muscle weakness (generalized): Secondary | ICD-10-CM | POA: Diagnosis not present

## 2018-07-04 ENCOUNTER — Encounter: Payer: Medicare Other | Admitting: Physical Medicine & Rehabilitation

## 2018-07-05 ENCOUNTER — Encounter: Payer: Medicare Other | Attending: Physical Medicine & Rehabilitation | Admitting: Physical Medicine & Rehabilitation

## 2018-07-05 ENCOUNTER — Encounter: Payer: Self-pay | Admitting: Physical Medicine & Rehabilitation

## 2018-07-05 VITALS — BP 124/75 | HR 63 | Ht 66.5 in | Wt 130.0 lb

## 2018-07-05 DIAGNOSIS — N3281 Overactive bladder: Secondary | ICD-10-CM | POA: Diagnosis not present

## 2018-07-05 DIAGNOSIS — R5381 Other malaise: Secondary | ICD-10-CM | POA: Diagnosis not present

## 2018-07-05 DIAGNOSIS — G20 Parkinson's disease: Secondary | ICD-10-CM | POA: Insufficient documentation

## 2018-07-05 DIAGNOSIS — D696 Thrombocytopenia, unspecified: Secondary | ICD-10-CM | POA: Insufficient documentation

## 2018-07-05 NOTE — Progress Notes (Signed)
Subjective:    Patient ID: [name]., male    DOB: 1940/05/21, 78 y.o.   MRN: [id number]  HPI 78 year old right-handed male with history of headache, aortic stenosis, status post TAVR procedure, Parkinson's disease presents for follow-up for debility secondary to Parkinson's and multiple medical reasons.  Daughter provides history. Last clinic visit 05/23/2018. Since that time, he continues to receive SLP. His platelets are chronically low.  Denies falls. Still needs to see Neuro, appointment next month.   Pain Inventory Average Pain 3 Pain Right Now 3 My pain is intermittent, burning and aching  In the last 24 hours, has pain interfered with the following? General activity 4 Relation with others 4 Enjoyment of life 4 What TIME of day is your pain at its worst? daytime Sleep (in general) Fair  Pain is worse with: standing Pain improves with: rest and medication Relief from Meds: 4  Mobility walk with assistance use a cane use a walker ability to climb steps?  yes do you drive?  no needs help with transfers Do you have any goals in this area?  no  Function retired I need assistance with the following:  meal prep, household duties and shopping Do you have any goals in this area?  no  Neuro/Psych bladder control problems weakness trouble walking confusion  Prior Studies Any changes since last visit?  no  Physicians involved in your care Any changes since last visit?  no   Family History  Problem Relation Age of Onset  . Arthritis/Rheumatoid Mother   . Prostate cancer Father   . Anemia Daughter   . Asthma Daughter   . Diabetes Daughter   . Hypertension Daughter   . Kidney disease Daughter   . Cancer Son   . Breast cancer Sister   . Heart attack Neg Hx   . Stroke Neg Hx    Social History   Socioeconomic History  . Marital status: Widowed    Spouse name: Not on file  . Number of children: Not on file  . Years of education: Not on file  .  Highest education level: Not on file  Occupational History  . Not on file  Social Needs  . Financial resource strain: Not on file  . Food insecurity:    Worry: Not on file    Inability: Not on file  . Transportation needs:    Medical: Not on file    Non-medical: Not on file  Tobacco Use  . Smoking status: Never Smoker  . Smokeless tobacco: Never Used  Substance and Sexual Activity  . Alcohol use: No  . Drug use: No  . Sexual activity: Not on file  Lifestyle  . Physical activity:    Days per week: Not on file    Minutes per session: Not on file  . Stress: Not on file  Relationships  . Social connections:    Talks on phone: Not on file    Gets together: Not on file    Attends religious service: Not on file    Active member of club or organization: Not on file    Attends meetings of clubs or organizations: Not on file    Relationship status: Not on file  Other Topics Concern  . Not on file  Social History Narrative  . Not on file   Past Surgical History:  Procedure Laterality Date  . CARDIAC CATHETERIZATION  10/09/13  . cataract  2008   bilateral  . COLONOSCOPY    .  CYSTOSCOPY/RETROGRADE/URETEROSCOPY  05/29/2011   Procedure: CYSTOSCOPY/RETROGRADE/URETEROSCOPY;  Surgeon: Malka So, MD;  Location: [facility];  Service: Urology;  Laterality: Left;  no stent   . EYE SURGERY Bilateral    /w IOL  . INGUINAL HERNIA REPAIR Bilateral 03/30/2014   Procedure: OPEN BILATERAL INGUINAL HERNIA REPAIR WITH MESH;  Surgeon: Coralie Keens, MD;  Location: [place];  Service: General;  Laterality: Bilateral;  . INSERTION OF MESH Bilateral 03/30/2014   Procedure: INSERTION OF MESH;  Surgeon: Coralie Keens, MD;  Location: [place];  Service: General;  Laterality: Bilateral;  . INTRAOPERATIVE TRANSESOPHAGEAL ECHOCARDIOGRAM N/A 11/18/2013   Procedure: INTRAOPERATIVE TRANSESOPHAGEAL ECHOCARDIOGRAM;  Surgeon: Sherren Mocha, MD;  Location: [facility] OR;  Service: Open Heart Surgery;  Laterality: N/A;  .  LEFT AND RIGHT HEART CATHETERIZATION WITH CORONARY ANGIOGRAM N/A 10/09/2013   Procedure: LEFT AND RIGHT HEART CATHETERIZATION WITH CORONARY ANGIOGRAM;  Surgeon: Blane Ohara, MD;  Location: [facility] CATH LAB;  Service: Cardiovascular;  Laterality: N/A;  . TRANSCATHETER AORTIC VALVE REPLACEMENT, TRANSFEMORAL N/A 11/18/2013   Procedure: TRANSCATHETER AORTIC VALVE REPLACEMENT, TRANSFEMORAL;  Surgeon: Sherren Mocha, MD;  Location: [place];  Service: Open Heart Surgery;  Laterality: N/A;   Past Medical History:  Diagnosis Date  . Anxiety   . Aortic stenosis   . Arthritis    left hip  . Cancer ([place])    basal cell- facial?  . Depression   . Diabetes mellitus without complication ([place])   . GERD (gastroesophageal reflux disease)   . Headache(784.0)   . Heart murmur   . Hernia, inguinal, bilateral, recurrent   . Hypertension   . Kidney stones   . Parkinson disease ([place])   . Parkinson's disease (tremor, stiffness, slow motion, unstable posture) ([place]) 12/11/2006  . PONV (postoperative nausea and vomiting)   . Restless leg syndrome   . S/P TAVR (transcatheter aortic valve replacement) 11/18/2013   26 mm Edwards Sapien XT transcatheter heart valve placed via open right transfemoral approach  . TIA (transient ischemic attack) 02/08/2007   BP 124/75   Pulse 63   Ht 5' 6.5" (1.689 m)   Wt 130 lb (59 kg)   SpO2 96%   BMI 20.67 kg/m   Opioid Risk Score:   Fall Risk Score:  `1  Depression screen PHQ 2/9  No flowsheet data found.   Review of Systems  Constitutional: Negative.   HENT: Negative.   Eyes: Negative.   Respiratory: Negative.   Cardiovascular: Negative.   Gastrointestinal: Negative.   Endocrine: Negative.   Genitourinary: Positive for difficulty urinating.       Incontinence  Musculoskeletal: Positive for arthralgias, back pain and gait problem.  Skin: Negative.   Allergic/Immunologic: Negative.   Neurological: Positive for weakness and headaches.  Hematological: Negative.     Psychiatric/Behavioral: Positive for confusion.  All other systems reviewed and are negative.      Objective:   Physical Exam Constitutional: No distress . Vital signs reviewed. HENT: Normocephalic.  Atraumatic. Eyes: EOMI. No discharge. Cardiovascular: RRR. No JVD. Respiratory: CTA bilaterally. Normal effort. GI: BS +. Non-distended. Musc: No edema or tenderness in extremities. Gait: slow cadence with bradykinesia Neurological:  Alert Dysarthria  Follow simple commands. Motor: Grossly 4+/5 throughout, limited by bradykinesia and rigidity Skin: Skin is warm and dry.  Psychiatric: smiling, pleasant    Assessment & Plan:  78 year old right-handed male with history of headache, aortic stenosis, status post TAVR procedure, Parkinson's disease presents for follow-up for debility secondary to Parkinson's and multiple medical reasons.  1. Debility secondary to  Parkinson's disease/multi-medical             Cont SLP, completed PT/OT  2. Parkinson's disease.   Cont meds per Neuro, appointment in 1 month  3. Overactive bladder.   Patient believes symptoms are worse, needs follow up with Urology  4.  Chronic thrombocytopenia             Platelets 118 on 1/9, reviewed

## 2018-07-09 DIAGNOSIS — I10 Essential (primary) hypertension: Secondary | ICD-10-CM | POA: Diagnosis not present

## 2018-07-09 DIAGNOSIS — M6281 Muscle weakness (generalized): Secondary | ICD-10-CM | POA: Diagnosis not present

## 2018-07-09 DIAGNOSIS — G20 Parkinson's disease: Secondary | ICD-10-CM | POA: Diagnosis not present

## 2018-07-09 DIAGNOSIS — R471 Dysarthria and anarthria: Secondary | ICD-10-CM | POA: Diagnosis not present

## 2018-07-09 DIAGNOSIS — Z952 Presence of prosthetic heart valve: Secondary | ICD-10-CM | POA: Diagnosis not present

## 2018-07-09 DIAGNOSIS — G3183 Dementia with Lewy bodies: Secondary | ICD-10-CM | POA: Diagnosis not present

## 2018-07-11 ENCOUNTER — Ambulatory Visit: Payer: Medicare Other | Admitting: Neurology

## 2018-07-12 DIAGNOSIS — Z952 Presence of prosthetic heart valve: Secondary | ICD-10-CM | POA: Diagnosis not present

## 2018-07-12 DIAGNOSIS — G20 Parkinson's disease: Secondary | ICD-10-CM | POA: Diagnosis not present

## 2018-07-12 DIAGNOSIS — M6281 Muscle weakness (generalized): Secondary | ICD-10-CM | POA: Diagnosis not present

## 2018-07-12 DIAGNOSIS — I10 Essential (primary) hypertension: Secondary | ICD-10-CM | POA: Diagnosis not present

## 2018-07-12 DIAGNOSIS — R471 Dysarthria and anarthria: Secondary | ICD-10-CM | POA: Diagnosis not present

## 2018-07-12 DIAGNOSIS — G3183 Dementia with Lewy bodies: Secondary | ICD-10-CM | POA: Diagnosis not present

## 2018-07-22 DIAGNOSIS — G3183 Dementia with Lewy bodies: Secondary | ICD-10-CM | POA: Diagnosis not present

## 2018-07-22 DIAGNOSIS — R471 Dysarthria and anarthria: Secondary | ICD-10-CM | POA: Diagnosis not present

## 2018-07-22 DIAGNOSIS — I10 Essential (primary) hypertension: Secondary | ICD-10-CM | POA: Diagnosis not present

## 2018-07-22 DIAGNOSIS — Z952 Presence of prosthetic heart valve: Secondary | ICD-10-CM | POA: Diagnosis not present

## 2018-07-22 DIAGNOSIS — G20 Parkinson's disease: Secondary | ICD-10-CM | POA: Diagnosis not present

## 2018-07-22 DIAGNOSIS — M6281 Muscle weakness (generalized): Secondary | ICD-10-CM | POA: Diagnosis not present

## 2018-07-26 DIAGNOSIS — R471 Dysarthria and anarthria: Secondary | ICD-10-CM | POA: Diagnosis not present

## 2018-07-26 DIAGNOSIS — I10 Essential (primary) hypertension: Secondary | ICD-10-CM | POA: Diagnosis not present

## 2018-07-26 DIAGNOSIS — G3183 Dementia with Lewy bodies: Secondary | ICD-10-CM | POA: Diagnosis not present

## 2018-07-26 DIAGNOSIS — G20 Parkinson's disease: Secondary | ICD-10-CM | POA: Diagnosis not present

## 2018-07-26 DIAGNOSIS — M6281 Muscle weakness (generalized): Secondary | ICD-10-CM | POA: Diagnosis not present

## 2018-07-26 DIAGNOSIS — Z952 Presence of prosthetic heart valve: Secondary | ICD-10-CM | POA: Diagnosis not present

## 2018-07-29 DIAGNOSIS — M6281 Muscle weakness (generalized): Secondary | ICD-10-CM | POA: Diagnosis not present

## 2018-07-29 DIAGNOSIS — Z952 Presence of prosthetic heart valve: Secondary | ICD-10-CM | POA: Diagnosis not present

## 2018-07-29 DIAGNOSIS — R471 Dysarthria and anarthria: Secondary | ICD-10-CM | POA: Diagnosis not present

## 2018-07-29 DIAGNOSIS — G20 Parkinson's disease: Secondary | ICD-10-CM | POA: Diagnosis not present

## 2018-07-29 DIAGNOSIS — G3183 Dementia with Lewy bodies: Secondary | ICD-10-CM | POA: Diagnosis not present

## 2018-07-29 DIAGNOSIS — I10 Essential (primary) hypertension: Secondary | ICD-10-CM | POA: Diagnosis not present

## 2018-07-30 ENCOUNTER — Telehealth: Payer: Self-pay | Admitting: Neurology

## 2018-07-30 MED ORDER — CARBIDOPA-LEVODOPA 25-100 MG PO TABS: 2.0000 | ORAL_TABLET | Freq: Three times a day (TID) | ORAL | 1 refills | Status: DC

## 2018-07-30 NOTE — Telephone Encounter (Signed)
Refill sent to [facility].  

## 2018-07-30 NOTE — Telephone Encounter (Signed)
Patient's daughter called regarding patient needing a refill on his Carbidopa Levodopa 2500 Medication. She said it was filled at the hospital. They are needing to see if Dr. Carles Collet could fill it? He uses Paediatric nurse on [address]. Thanks

## 2018-07-30 NOTE — Telephone Encounter (Signed)
Error on pharmacy in last note. [facility] uses [facility] [place]

## 2018-07-31 DIAGNOSIS — G20 Parkinson's disease: Secondary | ICD-10-CM | POA: Diagnosis not present

## 2018-07-31 DIAGNOSIS — M6281 Muscle weakness (generalized): Secondary | ICD-10-CM | POA: Diagnosis not present

## 2018-07-31 DIAGNOSIS — Z7982 Long term (current) use of aspirin: Secondary | ICD-10-CM | POA: Diagnosis not present

## 2018-07-31 DIAGNOSIS — I10 Essential (primary) hypertension: Secondary | ICD-10-CM | POA: Diagnosis not present

## 2018-07-31 DIAGNOSIS — G3183 Dementia with Lewy bodies: Secondary | ICD-10-CM | POA: Diagnosis not present

## 2018-07-31 DIAGNOSIS — R471 Dysarthria and anarthria: Secondary | ICD-10-CM | POA: Diagnosis not present

## 2018-07-31 DIAGNOSIS — Z79899 Other long term (current) drug therapy: Secondary | ICD-10-CM | POA: Diagnosis not present

## 2018-07-31 DIAGNOSIS — Z952 Presence of prosthetic heart valve: Secondary | ICD-10-CM | POA: Diagnosis not present

## 2018-08-01 DIAGNOSIS — G3183 Dementia with Lewy bodies: Secondary | ICD-10-CM | POA: Diagnosis not present

## 2018-08-01 DIAGNOSIS — G20 Parkinson's disease: Secondary | ICD-10-CM | POA: Diagnosis not present

## 2018-08-01 DIAGNOSIS — I10 Essential (primary) hypertension: Secondary | ICD-10-CM | POA: Diagnosis not present

## 2018-08-01 DIAGNOSIS — R471 Dysarthria and anarthria: Secondary | ICD-10-CM | POA: Diagnosis not present

## 2018-08-01 DIAGNOSIS — Z952 Presence of prosthetic heart valve: Secondary | ICD-10-CM | POA: Diagnosis not present

## 2018-08-01 DIAGNOSIS — M6281 Muscle weakness (generalized): Secondary | ICD-10-CM | POA: Diagnosis not present

## 2018-08-07 ENCOUNTER — Ambulatory Visit: Payer: Medicare Other | Admitting: Neurology

## 2018-08-12 ENCOUNTER — Telehealth: Payer: Self-pay | Admitting: *Deleted

## 2018-08-12 ENCOUNTER — Encounter: Payer: Self-pay | Admitting: *Deleted

## 2018-08-12 NOTE — Progress Notes (Signed)
Virtual Visit via Video Note The purpose of this virtual visit is to provide medical care while limiting exposure to the novel coronavirus.    Consent was obtained for video visit:  Yes.   Answered questions that patient had about telehealth interaction:  Yes.   I discussed the limitations, risks, security and privacy concerns of performing an evaluation and management service by telemedicine. I also discussed with the patient that there may be a patient responsible charge related to this service. The patient expressed understanding and agreed to proceed.  Pt location: Home Physician Location: office Name of referring provider:  Lavone Orn, MD I connected with [name]. at patients initiation/request on 08/13/2018 at  2:00 PM EDT by video enabled telemedicine application and verified that I am speaking with the correct person using two identifiers. Pt MRN:  [id number] Pt DOB:  Nov 10, 1940  Parties present throughout the visit: Patient/daughter/son who supplements the history   History of Present Illness:  Patient presents today for follow-up of Parkinson's disease.  I have reviewed numerous records, including his hospital records, which took 35 min of nonface to face time prior to the visit.  I have not seen him in quite some time.  He had to cancel his last appointment because of bad weather.  He is currently taking carbidopa/levodopa 25/100, 2 tablets 4 times per day.  He is on donepezil, 10 mg daily for Parkinson's related memory change.  He was admitted to the hospital in November, 2019.  Initial complaint was what sounded like eyelid opening apraxia.  MRI of the brain was completed as they were concerned about stroke.  There was nothing acute.  There are small hygromas along the interhemispheric fissure and left parietal convexity.  There is cerebral atrophy.  I personally reviewed those films.  The hospitalist initially increased his levodopa to 2 tablets 4 times per day.  He  was discharged to inpatient rehab.  He was there until April 29, 2018.  His daughter called me only 2 days later to state that the symptoms seem to be reemerging all over again.  It turns out that she had mixed up his carbidopa/levodopa and his clonazepam and was giving him clonazepam, 2 tablets 3 times per day and the levodopa just at bedtime.  PT came to the home and it is now completed.  Daughter stated that he is basically back to baseline.  When he first got out of the hospital someone was there 24 hours per day (first 6 weeks).  Now someone comes daily to make sure that he is okay.  Caregivers come Monday through Friday, 4 to 5 hours/day.  Family calls him at medication time.  He is having no hallucinations.  He is able to do his own activities of daily living.  He uses the Rollator if out of the house and a cane in the house.    Observations/Objective:   Vitals:   08/13/18 1403  BP: (!) 141/68  Pulse: (!) 56  Temp: (!) 96.9 F (36.1 C)    GEN:  The patient appears stated age and is in NAD.  Neurological examination:  Orientation: The patient is alert and oriented x3. Cranial nerves: There is good facial symmetry. There is facial hypomimia.  The speech is fluent and clear, but he is quite hypophonic which is not unusual for him. Soft palate rises symmetrically and there is no tongue deviation. Hearing is intact to conversational tone. Motor: Strength is at least antigravity x 4.  Shoulder shrug is equal and symmetric.  There is no pronator drift.  Movement examination: Tone: unable Abnormal movements: None Coordination:  There is slowness with all rapid alternating movements in the upper extremities. Gait and Station: The patient pushes off of his recliner chair to arise.  He walks with his cane.  He does not shuffle, but he is short stepped.  He is stooped at the waist.   Assessment and Plan:   1.  Idiopathic Parkinson's disease.  The patient has tremor, bradykinesia, rigidity  and postural instability.  Dx was in 2008 by Dr. Love/Dohmeier but followed by Dr. Maxine Glenn thereafter             -We discussed the diagnosis as well as pathophysiology of the disease.  We discussed treatment options as well as prognostic indicators.  Patient education was provided.             -The patient will continue carbidopa/levodopa 25/100, 2 tablets 4 times per day.  -Patient is using clonazepam 0.5 mg at bedtime for sleep and mild RBD.  His family asked me about a refill.  It looks like the rehab physician assistant gave him enough until June.  They will let me know when this runs out.  -Discussed in detail Covid-19 and risk factors for this, including age and PD.  Discussed importance of social distancing.  Discussed importance of staying home at all times, as is feasible.  Discussed taking advantage of grocery store hours for the elderly.  Pt expressed understanding.   2.   Parkinsons dementia             -safety in home discussed             -on donepezil, 10 mg daily.  Will continue.  -Patient's hospitalization in November, 2019 likely due to medication error.  Carbidopa/levodopa and clonazepam were being confused and clonazepam was being given instead of levodopa.  Symptoms have since resolved.     Follow Up Instructions:    -I discussed the assessment and treatment plan with the patient. The patient was provided an opportunity to ask questions and all were answered. The patient agreed with the plan and demonstrated an understanding of the instructions.   The patient was advised to call back or seek an in-person evaluation if the symptoms worsen or if the condition fails to improve as anticipated.    Total Time spent in visit with the patient was:  30 min, of which more than 50% of the time was spent in counseling and/or coordinating care on safety.   Pt understands and agrees with the plan of care outlined.    This did not include the 35 min of record review which was  detailed above, which was non face to face time.    Alonza Bogus, DO

## 2018-08-12 NOTE — Telephone Encounter (Signed)
Verified patient's medications with his wife.

## 2018-08-13 ENCOUNTER — Encounter: Payer: Self-pay | Admitting: Neurology

## 2018-08-13 ENCOUNTER — Encounter: Payer: Self-pay | Admitting: *Deleted

## 2018-08-13 ENCOUNTER — Other Ambulatory Visit: Payer: Self-pay

## 2018-08-13 ENCOUNTER — Telehealth (INDEPENDENT_AMBULATORY_CARE_PROVIDER_SITE_OTHER): Payer: Medicare Other | Admitting: Neurology

## 2018-08-13 DIAGNOSIS — G20 Parkinson's disease: Secondary | ICD-10-CM | POA: Diagnosis not present

## 2018-08-13 DIAGNOSIS — G4752 REM sleep behavior disorder: Secondary | ICD-10-CM

## 2018-08-13 DIAGNOSIS — F0280 Dementia in other diseases classified elsewhere without behavioral disturbance: Secondary | ICD-10-CM

## 2018-09-23 ENCOUNTER — Other Ambulatory Visit: Payer: Self-pay | Admitting: *Deleted

## 2018-09-23 MED ORDER — CARBIDOPA-LEVODOPA 25-100 MG PO TABS: 2.0000 | ORAL_TABLET | Freq: Four times a day (QID) | ORAL | 3 refills | Status: DC

## 2018-09-30 ENCOUNTER — Ambulatory Visit: Payer: Medicare Other | Admitting: Neurology

## 2018-09-30 ENCOUNTER — Encounter

## 2018-10-03 ENCOUNTER — Encounter: Payer: Medicare Other | Admitting: Physical Medicine & Rehabilitation

## 2018-10-21 DIAGNOSIS — G20 Parkinson's disease: Secondary | ICD-10-CM | POA: Diagnosis not present

## 2018-10-21 DIAGNOSIS — Z0000 Encounter for general adult medical examination without abnormal findings: Secondary | ICD-10-CM | POA: Diagnosis not present

## 2018-10-21 DIAGNOSIS — M25559 Pain in unspecified hip: Secondary | ICD-10-CM | POA: Diagnosis not present

## 2018-10-21 DIAGNOSIS — Z1389 Encounter for screening for other disorder: Secondary | ICD-10-CM | POA: Diagnosis not present

## 2018-10-21 DIAGNOSIS — R350 Frequency of micturition: Secondary | ICD-10-CM | POA: Diagnosis not present

## 2018-11-01 MED ORDER — CLONAZEPAM 0.5 MG PO TABS: 0.5000 mg | ORAL_TABLET | Freq: Every day | ORAL | 1 refills | Status: DC

## 2018-11-01 MED ORDER — DONEPEZIL HCL 10 MG PO TABS: 10.0000 mg | ORAL_TABLET | Freq: Every day | ORAL | 1 refills | Status: DC

## 2018-11-01 MED ORDER — CARBIDOPA-LEVODOPA 25-100 MG PO TABS: 2.0000 | ORAL_TABLET | Freq: Four times a day (QID) | ORAL | 1 refills | Status: DC

## 2018-11-01 NOTE — Telephone Encounter (Signed)
Okay to refill.  Just confirm in my note that dosages are right

## 2018-11-01 NOTE — Telephone Encounter (Signed)
Requested Prescriptions   Pending Prescriptions Disp Refills  . carbidopa-levodopa (SINEMET IR) 25-100 MG tablet 120 tablet 3    Sig: Take 2 tablets by mouth 4 (four) times daily.  . clonazePAM (KLONOPIN) 0.5 MG tablet 90 tablet 1    Sig: Take 1 tablet (0.5 mg total) by mouth at bedtime.  . donepezil (ARICEPT) 10 MG tablet 90 tablet 1    Sig: Take 1 tablet (10 mg total) by mouth at bedtime.    Ok to send Rx written by Hospital physician   Pt last seen:08/13/18  Follow up appt scheduled:01/16/19

## 2018-11-01 NOTE — Addendum Note (Signed)
Addended by: Ranae Plumber on: 11/01/2018 03:24 PM   Modules accepted: Orders

## 2019-01-15 NOTE — Progress Notes (Signed)
[name]. was seen today in follow up for Parkinsons disease.  Pt is currently on carbidopa/levodopa 25/100, 2 po qid.  Pt states that he has 2 falls but daughter states that he has had 4 falls.  3 of them have been since July 8 and that is unusual for him.  With one, he was in the kitchen and fell (unsure if tripped over the shoelace or was just in the turn and fell).  With one, he was going to answer the door and was getting out of the chair and turned and fell.  With another, he was in the closet and had not turned on the closet light and ended up falling. Last labs from the hospital in January.  Pt denies lightheadedness, near syncope.  Daughter states that BP has been running really low.  Checking it twice per week.  Running in the 80-90's/40-50's.  No hallucinations.  Mood has been good.   PREVIOUS MEDICATIONS: Sinemet and Requip  ALLERGIES:   Allergies  Allergen Reactions  . Penicillins     Unsure of reaction  . Vicodin [Hydrocodone-Acetaminophen] Other (See Comments)    "Mental reaction"    CURRENT MEDICATIONS:  Outpatient Encounter Medications as of 01/16/2019  Medication Sig  . Acetaminophen (TYLENOL ARTHRITIS PAIN PO) Take by mouth as needed.  Marland Kitchen aspirin EC 81 MG tablet Take 1 tablet (81 mg total) by mouth daily.  . carbidopa-levodopa (SINEMET IR) 25-100 MG tablet Take 2 tablets by mouth 4 (four) times daily.  . cholecalciferol (VITAMIN D) 1000 units tablet Take 1 tablet (1,000 Units total) by mouth at bedtime.  . clonazePAM (KLONOPIN) 0.5 MG tablet Take 1 tablet (0.5 mg total) by mouth at bedtime.  . donepezil (ARICEPT) 10 MG tablet Take 1 tablet (10 mg total) by mouth at bedtime.  Marland Kitchen MYRBETRIQ 50 MG TB24 tablet Take 1 tablet (50 mg total) by mouth daily.  . midodrine (PROAMATINE) 2.5 MG tablet Take 1 tablet (2.5 mg total) by mouth 2 (two) times daily with a meal.  . [DISCONTINUED] acetaminophen (TYLENOL) 325 MG tablet Take 2 tablets (650 mg total) by mouth every 6  (six) hours as needed for mild pain (or Fever >/= 101).   No facility-administered encounter medications on file as of 01/16/2019.     PAST MEDICAL HISTORY:   Past Medical History:  Diagnosis Date  . Anxiety   . Aortic stenosis   . Arthritis    left hip  . Cancer ([place])    basal cell- facial?  . Depression   . Diabetes mellitus without complication ([place])   . GERD (gastroesophageal reflux disease)   . Headache(784.0)   . Heart murmur   . Hernia, inguinal, bilateral, recurrent   . Hypertension   . Kidney stones   . Parkinson disease ([place])   . Parkinson's disease (tremor, stiffness, slow motion, unstable posture) ([place]) 12/11/2006  . PONV (postoperative nausea and vomiting)   . Restless leg syndrome   . S/P TAVR (transcatheter aortic valve replacement) 11/18/2013   26 mm Edwards Sapien XT transcatheter heart valve placed via open right transfemoral approach  . TIA (transient ischemic attack) 02/08/2007    PAST SURGICAL HISTORY:   Past Surgical History:  Procedure Laterality Date  . CARDIAC CATHETERIZATION  10/09/13  . cataract  2008   bilateral  . COLONOSCOPY    . CYSTOSCOPY/RETROGRADE/URETEROSCOPY  05/29/2011   Procedure: CYSTOSCOPY/RETROGRADE/URETEROSCOPY;  Surgeon: Malka So, MD;  Location: [facility];  Service: Urology;  Laterality:  Left;  no stent   . EYE SURGERY Bilateral    /w IOL  . INGUINAL HERNIA REPAIR Bilateral 03/30/2014   Procedure: OPEN BILATERAL INGUINAL HERNIA REPAIR WITH MESH;  Surgeon: Coralie Keens, MD;  Location: [facility];  Service: General;  Laterality: Bilateral;  . INSERTION OF MESH Bilateral 03/30/2014   Procedure: INSERTION OF MESH;  Surgeon: Coralie Keens, MD;  Location: [facility];  Service: General;  Laterality: Bilateral;  . INTRAOPERATIVE TRANSESOPHAGEAL ECHOCARDIOGRAM N/A 11/18/2013   Procedure: INTRAOPERATIVE TRANSESOPHAGEAL ECHOCARDIOGRAM;  Surgeon: Sherren Mocha, MD;  Location: [facility] OR;  Service: Open Heart Surgery;  Laterality: N/A;  . LEFT AND RIGHT  HEART CATHETERIZATION WITH CORONARY ANGIOGRAM N/A 10/09/2013   Procedure: LEFT AND RIGHT HEART CATHETERIZATION WITH CORONARY ANGIOGRAM;  Surgeon: Blane Ohara, MD;  Location: [facility] CATH LAB;  Service: Cardiovascular;  Laterality: N/A;  . TRANSCATHETER AORTIC VALVE REPLACEMENT, TRANSFEMORAL N/A 11/18/2013   Procedure: TRANSCATHETER AORTIC VALVE REPLACEMENT, TRANSFEMORAL;  Surgeon: Sherren Mocha, MD;  Location: [facility];  Service: Open Heart Surgery;  Laterality: N/A;    SOCIAL HISTORY:   Social History   Socioeconomic History  . Marital status: Widowed    Spouse name: Not on file  . Number of children: Not on file  . Years of education: Not on file  . Highest education level: Not on file  Occupational History  . Not on file  Social Needs  . Financial resource strain: Not on file  . Food insecurity    Worry: Not on file    Inability: Not on file  . Transportation needs    Medical: Not on file    Non-medical: Not on file  Tobacco Use  . Smoking status: Never Smoker  . Smokeless tobacco: Never Used  Substance and Sexual Activity  . Alcohol use: No  . Drug use: No  . Sexual activity: Not on file  Lifestyle  . Physical activity    Days per week: Not on file    Minutes per session: Not on file  . Stress: Not on file  Relationships  . Social Herbalist on phone: Not on file    Gets together: Not on file    Attends religious service: Not on file    Active member of club or organization: Not on file    Attends meetings of clubs or organizations: Not on file    Relationship status: Not on file  . Intimate partner violence    Fear of current or ex partner: Not on file    Emotionally abused: Not on file    Physically abused: Not on file    Forced sexual activity: Not on file  Other Topics Concern  . Not on file  Social History Narrative  . Not on file    FAMILY HISTORY:   Family Status  Relation Name Status  . Mother  Deceased at age 36  . Father  Deceased at age  3  . Daughter  (Not Specified)  . Daughter  (Not Specified)  . Daughter  (Not Specified)  . Daughter  (Not Specified)  . Daughter  (Not Specified)  . Son  (Not Specified)  . Sister  Alive  . Sister  Alive  . Neg Hx  (Not Specified)    ROS:  Review of Systems  Constitutional: Negative.   HENT: Negative.   Eyes: Negative.   Respiratory: Negative.   Cardiovascular: Negative.   Gastrointestinal: Negative.   Genitourinary: Negative.   Skin: Negative.  PHYSICAL EXAMINATION:    VITALS:   Vitals:   01/16/19 1515 01/16/19 1537  BP: 130/80 90/60  Pulse: 84   Weight: 128 lb (58.1 kg)   Height: 5\' 7"  (1.702 m)     GEN:  The patient appears stated age and is in NAD. HEENT:  Normocephalic, atraumatic.  The mucous membranes are moist. The superficial temporal arteries are without ropiness or tenderness. CV:  RRR Lungs:  CTAB Neck/HEME:  There are no carotid bruits bilaterally.  Neurological examination:  Orientation: The patient is alert and oriented x3. Cranial nerves: There is good facial symmetry with facial hypomimia. The speech is fluent, hypophonic and dysarthric (baseline). Soft palate rises symmetrically and there is no tongue deviation. Hearing is intact to conversational tone. Sensation: Sensation is intact to light touch throughout Motor: Strength is at least antigravity x4.  Movement examination: Tone: There is normal tone in the upper and lower extremities. Abnormal movements: No tremor. Coordination:  There is  decremation with RAM's, with any form of RAMS, including alternating supination and pronation of the forearm, hand opening and closing, finger taps, heel taps and toe taps, right more so than left Gait and Station: The patient has  difficulty arising out of a deep-seated chair without the use of the hands. The patient's stride length is decreased, and he drags the right leg.  He festinate significantly.     ASSESSMENT/PLAN:  1. Idiopathic Parkinson's  disease. The patient has tremor, bradykinesia, rigidity and postural instability. Dx was in 2008 by Dr. Love/Dohmeier but followed by Dr. Maxine Glenn thereafter -We discussed the diagnosis as well as pathophysiology of the disease. We discussed treatment options as well as prognostic indicators. Patient education was provided. -The patient will continue carbidopa/levodopa 25/100, 2 tablets 4 times per day.             -Patient is using clonazepam 0.5 mg at bedtime for sleep and mild RBD.  His family asked me about a refill.  It looks like the rehab physician assistant gave him enough until June.  They will let me know when this runs out.             -Discussed in detail Covid-19 and risk factors for this, including age and PD.  Discussed importance of social distancing.  Discussed importance of staying home at all times, as is feasible.  Discussed taking advantage of grocery store hours for the elderly.  Pt expressed understanding.  -recommend PT and they were agreeable to home physical therapy.  We will send a referral.  Safety discussed extensively.  Recommended a walker.  -start low dose midodrine 2.5 mg bid.  He was orthostatic in the office.  He will check his blood pressure in various positions at home and they will email me in 2 weeks and let me know how he is doing.raise head of bed.  -labs today, including CBC, chemistry, urinalysis, given multiple falls, although I suspect that this is mostly related to festination, but may be related to orthostasis as well.     2. Parkinsons dementia -safety in home discussed -on donepezil, 10 mg daily. Will continue.             -Patient's hospitalization in November, 2019 likely due to medication error.  Carbidopa/levodopa and clonazepam were being confused and clonazepam was being given instead of levodopa.  Symptoms have since resolved.  3.  Follow up is anticipated in the next 4-6 months, sooner  should new neurologic issues arise.  Much greater  than 50% of this visit was spent in counseling and coordinating care.  Total face to face time:  40 min  Cc:  Lavone Orn, MD

## 2019-01-16 ENCOUNTER — Other Ambulatory Visit: Payer: Self-pay

## 2019-01-16 ENCOUNTER — Ambulatory Visit (INDEPENDENT_AMBULATORY_CARE_PROVIDER_SITE_OTHER): Payer: Medicare Other | Admitting: Neurology

## 2019-01-16 ENCOUNTER — Other Ambulatory Visit (INDEPENDENT_AMBULATORY_CARE_PROVIDER_SITE_OTHER): Payer: Medicare Other

## 2019-01-16 ENCOUNTER — Encounter: Payer: Self-pay | Admitting: Neurology

## 2019-01-16 VITALS — BP 90/60 | HR 84 | Ht 67.0 in | Wt 128.0 lb

## 2019-01-16 DIAGNOSIS — G20 Parkinson's disease: Secondary | ICD-10-CM

## 2019-01-16 DIAGNOSIS — F0280 Dementia in other diseases classified elsewhere without behavioral disturbance: Secondary | ICD-10-CM

## 2019-01-16 DIAGNOSIS — R32 Unspecified urinary incontinence: Secondary | ICD-10-CM | POA: Diagnosis not present

## 2019-01-16 DIAGNOSIS — G903 Multi-system degeneration of the autonomic nervous system: Secondary | ICD-10-CM | POA: Diagnosis not present

## 2019-01-16 DIAGNOSIS — R748 Abnormal levels of other serum enzymes: Secondary | ICD-10-CM | POA: Diagnosis not present

## 2019-01-16 MED ORDER — MIDODRINE HCL 2.5 MG PO TABS: 2.5000 mg | ORAL_TABLET | Freq: Two times a day (BID) | ORAL | 1 refills | Status: DC

## 2019-01-18 LAB — CBC WITH DIFFERENTIAL/PLATELET
Absolute Monocytes: 347 cells/uL (ref 200–950)
Basophils Absolute: 18 cells/uL (ref 0–200)
Basophils Relative: 0.4 %
Eosinophils Absolute: 158 cells/uL (ref 15–500)
Eosinophils Relative: 3.5 %
HCT: 38.3 % — ABNORMAL LOW (ref 38.5–50.0)
Hemoglobin: 12.7 g/dL — ABNORMAL LOW (ref 13.2–17.1)
Lymphs Abs: 932 cells/uL (ref 850–3900)
MCH: 29.5 pg (ref 27.0–33.0)
MCHC: 33.2 g/dL (ref 32.0–36.0)
MCV: 88.9 fL (ref 80.0–100.0)
MPV: 12.6 fL — ABNORMAL HIGH (ref 7.5–12.5)
Monocytes Relative: 7.7 %
Neutro Abs: 3047 cells/uL (ref 1500–7800)
Neutrophils Relative %: 67.7 %
Platelets: 105 10*3/uL — ABNORMAL LOW (ref 140–400)
RBC: 4.31 10*6/uL (ref 4.20–5.80)
RDW: 12.7 % (ref 11.0–15.0)
Total Lymphocyte: 20.7 %
WBC: 4.5 10*3/uL (ref 3.8–10.8)

## 2019-01-18 LAB — COMPREHENSIVE METABOLIC PANEL
AG Ratio: 2 (calc) (ref 1.0–2.5)
ALT: 3 U/L — ABNORMAL LOW (ref 9–46)
AST: 13 U/L (ref 10–35)
Albumin: 4.2 g/dL (ref 3.6–5.1)
Alkaline phosphatase (APISO): 82 U/L (ref 35–144)
BUN: 20 mg/dL (ref 7–25)
CO2: 28 mmol/L (ref 20–32)
Calcium: 9.3 mg/dL (ref 8.6–10.3)
Chloride: 104 mmol/L (ref 98–110)
Creat: 1.11 mg/dL (ref 0.70–1.18)
Globulin: 2.1 g/dL (calc) (ref 1.9–3.7)
Glucose, Bld: 105 mg/dL — ABNORMAL HIGH (ref 65–99)
Potassium: 4.8 mmol/L (ref 3.5–5.3)
Sodium: 140 mmol/L (ref 135–146)
Total Bilirubin: 0.5 mg/dL (ref 0.2–1.2)
Total Protein: 6.3 g/dL (ref 6.1–8.1)

## 2019-01-18 LAB — URINALYSIS
Bilirubin Urine: NEGATIVE
Glucose, UA: NEGATIVE
Hgb urine dipstick: NEGATIVE
Ketones, ur: NEGATIVE
Leukocytes,Ua: NEGATIVE
Nitrite: NEGATIVE
Protein, ur: NEGATIVE
Specific Gravity, Urine: 1.015 (ref 1.001–1.03)
pH: 5.5 (ref 5.0–8.0)

## 2019-01-18 LAB — URINE CULTURE
MICRO NUMBER:: 844995
Result:: NO GROWTH
SPECIMEN QUALITY:: ADEQUATE

## 2019-01-24 DIAGNOSIS — G20 Parkinson's disease: Secondary | ICD-10-CM | POA: Diagnosis not present

## 2019-01-24 DIAGNOSIS — N200 Calculus of kidney: Secondary | ICD-10-CM | POA: Diagnosis not present

## 2019-01-24 DIAGNOSIS — F329 Major depressive disorder, single episode, unspecified: Secondary | ICD-10-CM | POA: Diagnosis not present

## 2019-01-24 DIAGNOSIS — E119 Type 2 diabetes mellitus without complications: Secondary | ICD-10-CM | POA: Diagnosis not present

## 2019-01-24 DIAGNOSIS — M1612 Unilateral primary osteoarthritis, left hip: Secondary | ICD-10-CM | POA: Diagnosis not present

## 2019-01-24 DIAGNOSIS — K219 Gastro-esophageal reflux disease without esophagitis: Secondary | ICD-10-CM | POA: Diagnosis not present

## 2019-01-24 DIAGNOSIS — I10 Essential (primary) hypertension: Secondary | ICD-10-CM | POA: Diagnosis not present

## 2019-01-24 DIAGNOSIS — Z8673 Personal history of transient ischemic attack (TIA), and cerebral infarction without residual deficits: Secondary | ICD-10-CM | POA: Diagnosis not present

## 2019-01-24 DIAGNOSIS — R51 Headache: Secondary | ICD-10-CM | POA: Diagnosis not present

## 2019-01-24 DIAGNOSIS — G903 Multi-system degeneration of the autonomic nervous system: Secondary | ICD-10-CM | POA: Diagnosis not present

## 2019-01-24 DIAGNOSIS — F419 Anxiety disorder, unspecified: Secondary | ICD-10-CM | POA: Diagnosis not present

## 2019-01-24 DIAGNOSIS — Z952 Presence of prosthetic heart valve: Secondary | ICD-10-CM | POA: Diagnosis not present

## 2019-01-24 DIAGNOSIS — G2581 Restless legs syndrome: Secondary | ICD-10-CM | POA: Diagnosis not present

## 2019-01-24 DIAGNOSIS — K4021 Bilateral inguinal hernia, without obstruction or gangrene, recurrent: Secondary | ICD-10-CM | POA: Diagnosis not present

## 2019-01-24 DIAGNOSIS — Z7982 Long term (current) use of aspirin: Secondary | ICD-10-CM | POA: Diagnosis not present

## 2019-01-24 DIAGNOSIS — R32 Unspecified urinary incontinence: Secondary | ICD-10-CM | POA: Diagnosis not present

## 2019-01-24 DIAGNOSIS — Z9181 History of falling: Secondary | ICD-10-CM | POA: Diagnosis not present

## 2019-01-24 DIAGNOSIS — R011 Cardiac murmur, unspecified: Secondary | ICD-10-CM | POA: Diagnosis not present

## 2019-01-24 DIAGNOSIS — Z85828 Personal history of other malignant neoplasm of skin: Secondary | ICD-10-CM | POA: Diagnosis not present

## 2019-01-24 DIAGNOSIS — F0280 Dementia in other diseases classified elsewhere without behavioral disturbance: Secondary | ICD-10-CM | POA: Diagnosis not present

## 2019-01-27 DIAGNOSIS — R32 Unspecified urinary incontinence: Secondary | ICD-10-CM | POA: Diagnosis not present

## 2019-01-27 DIAGNOSIS — G20 Parkinson's disease: Secondary | ICD-10-CM | POA: Diagnosis not present

## 2019-01-27 DIAGNOSIS — I10 Essential (primary) hypertension: Secondary | ICD-10-CM | POA: Diagnosis not present

## 2019-01-27 DIAGNOSIS — F0280 Dementia in other diseases classified elsewhere without behavioral disturbance: Secondary | ICD-10-CM | POA: Diagnosis not present

## 2019-01-27 DIAGNOSIS — G903 Multi-system degeneration of the autonomic nervous system: Secondary | ICD-10-CM | POA: Diagnosis not present

## 2019-01-27 DIAGNOSIS — E119 Type 2 diabetes mellitus without complications: Secondary | ICD-10-CM | POA: Diagnosis not present

## 2019-01-29 ENCOUNTER — Telehealth: Payer: Self-pay

## 2019-01-29 DIAGNOSIS — R32 Unspecified urinary incontinence: Secondary | ICD-10-CM | POA: Diagnosis not present

## 2019-01-29 DIAGNOSIS — G903 Multi-system degeneration of the autonomic nervous system: Secondary | ICD-10-CM | POA: Diagnosis not present

## 2019-01-29 DIAGNOSIS — G20 Parkinson's disease: Secondary | ICD-10-CM | POA: Diagnosis not present

## 2019-01-29 DIAGNOSIS — I10 Essential (primary) hypertension: Secondary | ICD-10-CM | POA: Diagnosis not present

## 2019-01-29 DIAGNOSIS — F0280 Dementia in other diseases classified elsewhere without behavioral disturbance: Secondary | ICD-10-CM | POA: Diagnosis not present

## 2019-01-29 DIAGNOSIS — E119 Type 2 diabetes mellitus without complications: Secondary | ICD-10-CM | POA: Diagnosis not present

## 2019-01-29 NOTE — Telephone Encounter (Signed)
Pt daughter drop FMLA form office at office for Dr. Carles Collet to complete.  Forms has been completed Copy made for chart.  Called patient daughter to make her aware of form fee. No answer left message that forms will be at front desk to pick up and to pay fee once pick up..  She also needs to sign form.

## 2019-02-03 DIAGNOSIS — E119 Type 2 diabetes mellitus without complications: Secondary | ICD-10-CM | POA: Diagnosis not present

## 2019-02-03 DIAGNOSIS — I10 Essential (primary) hypertension: Secondary | ICD-10-CM | POA: Diagnosis not present

## 2019-02-03 DIAGNOSIS — G903 Multi-system degeneration of the autonomic nervous system: Secondary | ICD-10-CM | POA: Diagnosis not present

## 2019-02-03 DIAGNOSIS — G20 Parkinson's disease: Secondary | ICD-10-CM | POA: Diagnosis not present

## 2019-02-03 DIAGNOSIS — F0280 Dementia in other diseases classified elsewhere without behavioral disturbance: Secondary | ICD-10-CM | POA: Diagnosis not present

## 2019-02-03 DIAGNOSIS — R32 Unspecified urinary incontinence: Secondary | ICD-10-CM | POA: Diagnosis not present

## 2019-02-05 DIAGNOSIS — G20 Parkinson's disease: Secondary | ICD-10-CM | POA: Diagnosis not present

## 2019-02-05 DIAGNOSIS — F0280 Dementia in other diseases classified elsewhere without behavioral disturbance: Secondary | ICD-10-CM | POA: Diagnosis not present

## 2019-02-05 DIAGNOSIS — R32 Unspecified urinary incontinence: Secondary | ICD-10-CM | POA: Diagnosis not present

## 2019-02-05 DIAGNOSIS — G903 Multi-system degeneration of the autonomic nervous system: Secondary | ICD-10-CM | POA: Diagnosis not present

## 2019-02-05 DIAGNOSIS — I10 Essential (primary) hypertension: Secondary | ICD-10-CM | POA: Diagnosis not present

## 2019-02-05 DIAGNOSIS — E119 Type 2 diabetes mellitus without complications: Secondary | ICD-10-CM | POA: Diagnosis not present

## 2019-02-06 DIAGNOSIS — Z0279 Encounter for issue of other medical certificate: Secondary | ICD-10-CM

## 2019-02-10 DIAGNOSIS — R32 Unspecified urinary incontinence: Secondary | ICD-10-CM | POA: Diagnosis not present

## 2019-02-10 DIAGNOSIS — G20 Parkinson's disease: Secondary | ICD-10-CM | POA: Diagnosis not present

## 2019-02-10 DIAGNOSIS — I10 Essential (primary) hypertension: Secondary | ICD-10-CM | POA: Diagnosis not present

## 2019-02-10 DIAGNOSIS — G903 Multi-system degeneration of the autonomic nervous system: Secondary | ICD-10-CM | POA: Diagnosis not present

## 2019-02-10 DIAGNOSIS — E119 Type 2 diabetes mellitus without complications: Secondary | ICD-10-CM | POA: Diagnosis not present

## 2019-02-10 DIAGNOSIS — F0280 Dementia in other diseases classified elsewhere without behavioral disturbance: Secondary | ICD-10-CM | POA: Diagnosis not present

## 2019-02-12 DIAGNOSIS — R32 Unspecified urinary incontinence: Secondary | ICD-10-CM | POA: Diagnosis not present

## 2019-02-12 DIAGNOSIS — G903 Multi-system degeneration of the autonomic nervous system: Secondary | ICD-10-CM | POA: Diagnosis not present

## 2019-02-12 DIAGNOSIS — G20 Parkinson's disease: Secondary | ICD-10-CM | POA: Diagnosis not present

## 2019-02-12 DIAGNOSIS — I10 Essential (primary) hypertension: Secondary | ICD-10-CM | POA: Diagnosis not present

## 2019-02-12 DIAGNOSIS — F0280 Dementia in other diseases classified elsewhere without behavioral disturbance: Secondary | ICD-10-CM | POA: Diagnosis not present

## 2019-02-12 DIAGNOSIS — E119 Type 2 diabetes mellitus without complications: Secondary | ICD-10-CM | POA: Diagnosis not present

## 2019-02-19 DIAGNOSIS — G20 Parkinson's disease: Secondary | ICD-10-CM | POA: Diagnosis not present

## 2019-02-19 DIAGNOSIS — I10 Essential (primary) hypertension: Secondary | ICD-10-CM | POA: Diagnosis not present

## 2019-02-19 DIAGNOSIS — R32 Unspecified urinary incontinence: Secondary | ICD-10-CM | POA: Diagnosis not present

## 2019-02-19 DIAGNOSIS — F0280 Dementia in other diseases classified elsewhere without behavioral disturbance: Secondary | ICD-10-CM | POA: Diagnosis not present

## 2019-02-19 DIAGNOSIS — G903 Multi-system degeneration of the autonomic nervous system: Secondary | ICD-10-CM | POA: Diagnosis not present

## 2019-02-19 DIAGNOSIS — E119 Type 2 diabetes mellitus without complications: Secondary | ICD-10-CM | POA: Diagnosis not present

## 2019-02-23 DIAGNOSIS — G903 Multi-system degeneration of the autonomic nervous system: Secondary | ICD-10-CM | POA: Diagnosis not present

## 2019-02-23 DIAGNOSIS — Z9181 History of falling: Secondary | ICD-10-CM | POA: Diagnosis not present

## 2019-02-23 DIAGNOSIS — K4021 Bilateral inguinal hernia, without obstruction or gangrene, recurrent: Secondary | ICD-10-CM | POA: Diagnosis not present

## 2019-02-23 DIAGNOSIS — G2581 Restless legs syndrome: Secondary | ICD-10-CM | POA: Diagnosis not present

## 2019-02-23 DIAGNOSIS — Z85828 Personal history of other malignant neoplasm of skin: Secondary | ICD-10-CM | POA: Diagnosis not present

## 2019-02-23 DIAGNOSIS — E119 Type 2 diabetes mellitus without complications: Secondary | ICD-10-CM | POA: Diagnosis not present

## 2019-02-23 DIAGNOSIS — R32 Unspecified urinary incontinence: Secondary | ICD-10-CM | POA: Diagnosis not present

## 2019-02-23 DIAGNOSIS — F419 Anxiety disorder, unspecified: Secondary | ICD-10-CM | POA: Diagnosis not present

## 2019-02-23 DIAGNOSIS — F0280 Dementia in other diseases classified elsewhere without behavioral disturbance: Secondary | ICD-10-CM | POA: Diagnosis not present

## 2019-02-23 DIAGNOSIS — Z7982 Long term (current) use of aspirin: Secondary | ICD-10-CM | POA: Diagnosis not present

## 2019-02-23 DIAGNOSIS — G20 Parkinson's disease: Secondary | ICD-10-CM | POA: Diagnosis not present

## 2019-02-23 DIAGNOSIS — Z952 Presence of prosthetic heart valve: Secondary | ICD-10-CM | POA: Diagnosis not present

## 2019-02-23 DIAGNOSIS — R519 Headache, unspecified: Secondary | ICD-10-CM | POA: Diagnosis not present

## 2019-02-23 DIAGNOSIS — M1612 Unilateral primary osteoarthritis, left hip: Secondary | ICD-10-CM | POA: Diagnosis not present

## 2019-02-23 DIAGNOSIS — I10 Essential (primary) hypertension: Secondary | ICD-10-CM | POA: Diagnosis not present

## 2019-02-23 DIAGNOSIS — R011 Cardiac murmur, unspecified: Secondary | ICD-10-CM | POA: Diagnosis not present

## 2019-02-23 DIAGNOSIS — Z8673 Personal history of transient ischemic attack (TIA), and cerebral infarction without residual deficits: Secondary | ICD-10-CM | POA: Diagnosis not present

## 2019-02-23 DIAGNOSIS — K219 Gastro-esophageal reflux disease without esophagitis: Secondary | ICD-10-CM | POA: Diagnosis not present

## 2019-02-23 DIAGNOSIS — F329 Major depressive disorder, single episode, unspecified: Secondary | ICD-10-CM | POA: Diagnosis not present

## 2019-02-23 DIAGNOSIS — N200 Calculus of kidney: Secondary | ICD-10-CM | POA: Diagnosis not present

## 2019-02-24 DIAGNOSIS — I10 Essential (primary) hypertension: Secondary | ICD-10-CM | POA: Diagnosis not present

## 2019-02-24 DIAGNOSIS — G903 Multi-system degeneration of the autonomic nervous system: Secondary | ICD-10-CM | POA: Diagnosis not present

## 2019-02-24 DIAGNOSIS — G20 Parkinson's disease: Secondary | ICD-10-CM | POA: Diagnosis not present

## 2019-02-24 DIAGNOSIS — F0280 Dementia in other diseases classified elsewhere without behavioral disturbance: Secondary | ICD-10-CM | POA: Diagnosis not present

## 2019-02-24 DIAGNOSIS — E119 Type 2 diabetes mellitus without complications: Secondary | ICD-10-CM | POA: Diagnosis not present

## 2019-02-24 DIAGNOSIS — R32 Unspecified urinary incontinence: Secondary | ICD-10-CM | POA: Diagnosis not present

## 2019-03-01 DIAGNOSIS — Z23 Encounter for immunization: Secondary | ICD-10-CM | POA: Diagnosis not present

## 2019-03-05 DIAGNOSIS — F0280 Dementia in other diseases classified elsewhere without behavioral disturbance: Secondary | ICD-10-CM | POA: Diagnosis not present

## 2019-03-05 DIAGNOSIS — E119 Type 2 diabetes mellitus without complications: Secondary | ICD-10-CM | POA: Diagnosis not present

## 2019-03-05 DIAGNOSIS — R32 Unspecified urinary incontinence: Secondary | ICD-10-CM | POA: Diagnosis not present

## 2019-03-05 DIAGNOSIS — I10 Essential (primary) hypertension: Secondary | ICD-10-CM | POA: Diagnosis not present

## 2019-03-05 DIAGNOSIS — G20 Parkinson's disease: Secondary | ICD-10-CM | POA: Diagnosis not present

## 2019-03-05 DIAGNOSIS — G903 Multi-system degeneration of the autonomic nervous system: Secondary | ICD-10-CM | POA: Diagnosis not present

## 2019-03-13 DIAGNOSIS — F0280 Dementia in other diseases classified elsewhere without behavioral disturbance: Secondary | ICD-10-CM | POA: Diagnosis not present

## 2019-03-13 DIAGNOSIS — G903 Multi-system degeneration of the autonomic nervous system: Secondary | ICD-10-CM | POA: Diagnosis not present

## 2019-03-13 DIAGNOSIS — R32 Unspecified urinary incontinence: Secondary | ICD-10-CM | POA: Diagnosis not present

## 2019-03-13 DIAGNOSIS — E119 Type 2 diabetes mellitus without complications: Secondary | ICD-10-CM | POA: Diagnosis not present

## 2019-03-13 DIAGNOSIS — I10 Essential (primary) hypertension: Secondary | ICD-10-CM | POA: Diagnosis not present

## 2019-03-13 DIAGNOSIS — G20 Parkinson's disease: Secondary | ICD-10-CM | POA: Diagnosis not present

## 2019-04-08 ENCOUNTER — Other Ambulatory Visit: Payer: Self-pay | Admitting: Neurology

## 2019-04-23 ENCOUNTER — Ambulatory Visit: Payer: Medicare Other | Admitting: Cardiology

## 2019-05-01 ENCOUNTER — Other Ambulatory Visit: Payer: Self-pay | Admitting: Neurology

## 2019-05-29 ENCOUNTER — Ambulatory Visit: Payer: Medicare Other | Admitting: Cardiology

## 2019-06-17 ENCOUNTER — Other Ambulatory Visit: Payer: Self-pay | Admitting: Neurology

## 2019-06-17 NOTE — Telephone Encounter (Signed)
Pt has appt in 2 days.  Find out from daughter if has enough until then

## 2019-06-17 NOTE — Progress Notes (Signed)
Virtual Visit via Video Note The purpose of this virtual visit is to provide medical care while limiting exposure to the novel coronavirus.    Consent was obtained for video visit:  Yes.   Answered questions that patient had about telehealth interaction:  Yes.   I discussed the limitations, risks, security and privacy concerns of performing an evaluation and management service by telemedicine. I also discussed with the patient that there may be a patient responsible charge related to this service. The patient expressed understanding and agreed to proceed.  Pt location: Home Physician Location: home Name of referring provider:  Lavone Orn, MD I connected with [name]. at patients initiation/request on 06/19/2019 at  3:00 PM EST by video enabled telemedicine application and verified that I am speaking with the correct person using two identifiers. Pt MRN:  [id number] Pt DOB:  July 27, 1940 Video Participants:  [name].;  Daughter supplements hx   History of Present Illness:  Patient seen today in follow-up for Parkinson's.  We initiated midodrine last visit, 2.5 mg twice per day.  They report that medication is helping but he is asymptommatic.  He has occasionally dizziness but not often.  They gave me several blood pressure readings ranging from 114/61, 141/71, 140/67 and 119/70.  Pt denies falls.   No hallucinations.  Mood has been good.  Having some throbbing of the legs - tylenol helps.  Had first covid vaccine last week.  Current movement d/o meds:  Carbidopa/levodopa 25/100, 2 tablets 4 times per day Clonazepam 0.5 mg daily Donepezil 10 mg daily Midodrine, 2.5 mg twice per day   Current Outpatient Medications on File Prior to Visit  Medication Sig Dispense Refill  . Acetaminophen (TYLENOL ARTHRITIS PAIN PO) Take by mouth as needed.    Marland Kitchen aspirin EC 81 MG tablet Take 1 tablet (81 mg total) by mouth daily.    . carbidopa-levodopa (SINEMET IR) 25-100 MG tablet  TAKE 2 TABLETS BY MOUTH 4 (FOUR) TIMES DAILY. 670 tablet 1  . cholecalciferol (VITAMIN D) 1000 units tablet Take 1 tablet (1,000 Units total) by mouth at bedtime. 30 tablet 1  . clonazePAM (KLONOPIN) 0.5 MG tablet Take 1 tablet (0.5 mg total) by mouth at bedtime. 90 tablet 1  . donepezil (ARICEPT) 10 MG tablet TAKE 1 TABLET BY MOUTH EVERYDAY AT BEDTIME 90 tablet 1  . midodrine (PROAMATINE) 2.5 MG tablet Take 1 tablet (2.5 mg total) by mouth 2 (two) times daily with a meal. 180 tablet 1  . MYRBETRIQ 50 MG TB24 tablet Take 1 tablet (50 mg total) by mouth daily. 30 tablet 11   No current facility-administered medications on file prior to visit.     Observations/Objective:   There were no vitals filed for this visit. GEN:  The patient appears stated age and is in NAD.  Neurological examination:  Orientation: The patient is alert and oriented x3. Cranial nerves: There is good facial symmetry. There is significant facial hypomimia.  The speech is fluent and very hypophonic. Soft palate rises symmetrically and there is no tongue deviation. Hearing is intact to conversational tone. Motor: Strength is at least antigravity x 4.   Shoulder shrug is equal and symmetric.  There is no pronator drift.  Movement examination: Tone: unable; he is slumped to the side (to the right) Abnormal movements: none Coordination:  There is  decremation with RAM's, with any form of RAMS, including alternating supination and pronation of the forearm, hand opening and closing, finger  taps Gait and Station: The patient pushes off of his chair to arise.  He is bent/flexed at the waist.  He is short stepped.  He generally ambulates fairly well.  He turns en bloc.    I have reviewed and interpreted the following labs independently Lab Results  Component Value Date   WBC 4.5 01/16/2019   HGB 12.7 (L) 01/16/2019   HCT 38.3 (L) 01/16/2019   MCV 88.9 01/16/2019   PLT 105 (L) 01/16/2019     Chemistry      Component  Value Date/Time   NA 140 01/16/2019 1613   K 4.8 01/16/2019 1613   CL 104 01/16/2019 1613   CO2 28 01/16/2019 1613   BUN 20 01/16/2019 1613   CREATININE 1.11 01/16/2019 1613      Component Value Date/Time   CALCIUM 9.3 01/16/2019 1613   ALKPHOS 91 04/16/2018 0447   AST 13 01/16/2019 1613   ALT 3 (L) 01/16/2019 1613   BILITOT 0.5 01/16/2019 1613     Lab Results  Component Value Date   TSH 1.57 11/16/2017     Assessment and Plan:   1. Parkinsons Disease -Continue carbidopa/levodopa 25/100, 2 tablets 4 times per day  -Would like to see the patient uses walker in the home  2. PDD -safety in home discussed -On donepezil, 10 mg daily.  3.  RBD  -On clonazepam 0.5 mg at bedtime.  This was refilled today.  4.  Neurogenic Orthostatic Hypotension  -On midodrine, 2.5 mg twice daily.  Seems to be working fairly well, based on the blood pressures that they gave me today.  Follow Up Instructions:  5 months  -I discussed the assessment and treatment plan with the patient. The patient was provided an opportunity to ask questions and all were answered. The patient agreed with the plan and demonstrated an understanding of the instructions.   The patient was advised to call back or seek an in-person evaluation if the symptoms worsen or if the condition fails to improve as anticipated.    Total time spent on today's visit was 20  minutes, including both face-to-face time and nonface-to-face time.  Time included that spent on review of records (prior notes available to me/labs/imaging if pertinent), discussing treatment and goals, answering patient's questions and coordinating care.   Alonza Bogus, DO

## 2019-06-17 NOTE — Telephone Encounter (Signed)
Mailbox is full - unable to leave a message. 

## 2019-06-19 ENCOUNTER — Encounter: Payer: Self-pay | Admitting: Neurology

## 2019-06-19 ENCOUNTER — Other Ambulatory Visit: Payer: Self-pay

## 2019-06-19 ENCOUNTER — Telehealth (INDEPENDENT_AMBULATORY_CARE_PROVIDER_SITE_OTHER): Payer: Medicare Other | Admitting: Neurology

## 2019-06-19 DIAGNOSIS — G903 Multi-system degeneration of the autonomic nervous system: Secondary | ICD-10-CM | POA: Diagnosis not present

## 2019-06-19 DIAGNOSIS — G20 Parkinson's disease: Secondary | ICD-10-CM | POA: Diagnosis not present

## 2019-06-19 MED ORDER — CLONAZEPAM 0.5 MG PO TABS: 0.5000 mg | ORAL_TABLET | Freq: Every day | ORAL | 1 refills | Status: DC

## 2019-07-07 ENCOUNTER — Other Ambulatory Visit: Payer: Self-pay | Admitting: Neurology

## 2019-07-07 NOTE — Telephone Encounter (Signed)
Rx(s) sent to pharmacy electronically.  

## 2019-08-04 DIAGNOSIS — Z961 Presence of intraocular lens: Secondary | ICD-10-CM | POA: Diagnosis not present

## 2019-08-04 DIAGNOSIS — H04123 Dry eye syndrome of bilateral lacrimal glands: Secondary | ICD-10-CM | POA: Diagnosis not present

## 2019-08-04 DIAGNOSIS — H35033 Hypertensive retinopathy, bilateral: Secondary | ICD-10-CM | POA: Diagnosis not present

## 2019-08-04 DIAGNOSIS — H40013 Open angle with borderline findings, low risk, bilateral: Secondary | ICD-10-CM | POA: Diagnosis not present

## 2019-09-07 ENCOUNTER — Other Ambulatory Visit: Payer: Self-pay | Admitting: Neurology

## 2019-10-31 ENCOUNTER — Other Ambulatory Visit: Payer: Self-pay | Admitting: Neurology

## 2019-11-19 NOTE — Progress Notes (Signed)
Assessment/Plan:   1.  Parkinsons Disease  -daughter would like to trial decreasing carbidopa/levodopa 25/100, 2 po qid to 2 po tid.  Told them that they could trial that if they would like and see if he gets worse.  If he does, then he can go back to qid.  -Patient asked about driving and when he could resume driving after he left.  I really do not think it is a good idea given the fact that his reflexes are so slow.  2.  PDD  -Continue donepezil, 10 mg daily  3.  RBD  -Continue clonazepam, 0.5 mg at bedtime.  4.  Neurogenic Orthostatic Hypotension  -Continue midodrine, 2.5 mg twice daily.  5.  Urinary frequency  -follow up with urology.  -discuss with urology if bladder botox or bladder PT is issue.  Discussed that they could ask with urology if percutaneous tibial nerve stimulation would be of value.  -trial of urinal by bedside   Subjective:   [name]. was seen today in follow up for Parkinsons disease.  My previous records were reviewed prior to todays visit.  Pt had 1 fall in the kitchen since last visit.  Slipped over piece of ice on floor and pulled self to get up on tablet.   Pt denies significant lightheadedness, near syncope.  No hallucinations.  Mood has been good.  Daughter would like to reduce carbidopa/levodopa 25/100 to tid from qid.  Not having issues with dreams.    Current prescribed movement disorder medications: Carbidopa/levodopa 25/100, 2 tablets 4 times per day (8am/11am/2pm/5pm) Clonazepam 0.5 mg daily Donepezil 10 mg daily Midodrine, 2.5 mg twice per day   ALLERGIES:   Allergies  Allergen Reactions  . Penicillins     Unsure of reaction  . Vicodin [Hydrocodone-Acetaminophen] Other (See Comments)    "Mental reaction"    CURRENT MEDICATIONS:  Outpatient Encounter Medications as of 11/20/2019  Medication Sig  . Acetaminophen (TYLENOL ARTHRITIS PAIN PO) Take by mouth as needed.  Marland Kitchen aspirin EC 81 MG tablet Take 1 tablet (81 mg total) by  mouth daily.  . carbidopa-levodopa (SINEMET IR) 25-100 MG tablet TAKE 2 TABLETS BY MOUTH 4 (FOUR) TIMES DAILY.  . cholecalciferol (VITAMIN D) 1000 units tablet Take 1 tablet (1,000 Units total) by mouth at bedtime.  . clonazePAM (KLONOPIN) 0.5 MG tablet Take 1 tablet (0.5 mg total) by mouth at bedtime.  . donepezil (ARICEPT) 10 MG tablet TAKE 1 TABLET BY MOUTH EVERYDAY AT BEDTIME  . midodrine (PROAMATINE) 2.5 MG tablet TAKE 1 TABLET (2.5 MG TOTAL) BY MOUTH 2 (TWO) TIMES DAILY WITH A MEAL.  Marland Kitchen MYRBETRIQ 50 MG TB24 tablet Take 1 tablet (50 mg total) by mouth daily.   No facility-administered encounter medications on file as of 11/20/2019.    Objective:   PHYSICAL EXAMINATION:    VITALS:   Vitals:   11/20/19 1503  BP: 117/69  Pulse: 67  SpO2: 97%  Weight: 129 lb (58.5 kg)  Height: 5\' 6"  (1.676 m)    GEN:  The patient appears stated age and is in NAD. HEENT:  Normocephalic, atraumatic.  The mucous membranes are moist. The superficial temporal arteries are without ropiness or tenderness. CV:  RRR Lungs:  CTAB Neck/HEME:  There are no carotid bruits bilaterally.  Neurological examination:  Orientation: The patient is alert and oriented x3. Cranial nerves: There is good facial symmetry with facial hypomimia. The speech is fluent and clear and hypophonic. Soft palate rises symmetrically and  there is no tongue deviation. Hearing is intact to conversational tone. Sensation: Sensation is intact to light touch throughout Motor: Strength is at least antigravity x4.  Movement examination: Tone: There is normal tone in the upper and lower extremities Abnormal movements: None Coordination:  There is  decremation with RAM's, on the right Gait and Station: The patient pushes off to arise.  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  He is flexed at the waist.  He is short stepped, but stable with a cane.  I have reviewed and interpreted the following labs independently    Chemistry      Component  Value Date/Time   NA 140 01/16/2019 1613   K 4.8 01/16/2019 1613   CL 104 01/16/2019 1613   CO2 28 01/16/2019 1613   BUN 20 01/16/2019 1613   CREATININE 1.11 01/16/2019 1613      Component Value Date/Time   CALCIUM 9.3 01/16/2019 1613   ALKPHOS 91 04/16/2018 0447   AST 13 01/16/2019 1613   ALT 3 (L) 01/16/2019 1613   BILITOT 0.5 01/16/2019 1613       Lab Results  Component Value Date   WBC 4.5 01/16/2019   HGB 12.7 (L) 01/16/2019   HCT 38.3 (L) 01/16/2019   MCV 88.9 01/16/2019   PLT 105 (L) 01/16/2019    Lab Results  Component Value Date   TSH 1.57 11/16/2017     Total time spent on today's visit was 40 minutes, including both face-to-face time and nonface-to-face time.  Time included that spent on review of records (prior notes available to me/labs/imaging if pertinent), discussing treatment and goals, answering patient's questions and coordinating care.  Cc:  Lavone Orn, MD

## 2019-11-20 ENCOUNTER — Other Ambulatory Visit: Payer: Self-pay

## 2019-11-20 ENCOUNTER — Encounter: Payer: Self-pay | Admitting: Neurology

## 2019-11-20 ENCOUNTER — Ambulatory Visit (INDEPENDENT_AMBULATORY_CARE_PROVIDER_SITE_OTHER): Payer: Medicare Other | Admitting: Neurology

## 2019-11-20 ENCOUNTER — Telehealth: Payer: Self-pay

## 2019-11-20 VITALS — BP 117/69 | HR 67 | Ht 66.0 in | Wt 129.0 lb

## 2019-11-20 DIAGNOSIS — G20 Parkinson's disease: Secondary | ICD-10-CM | POA: Diagnosis not present

## 2019-11-20 NOTE — Telephone Encounter (Signed)
Left detailed message on patients voicemail as patient requested with Dr Doristine Devoid recommendations. Advised patient to contact office with any questions or concerns.

## 2019-11-20 NOTE — Patient Instructions (Signed)
1.  Call urology and see if bladder botox, bladder PT, or if Percutaneous tibial nerve stimulation would be of value 2.  You can trial going to carbidopa/levodopa 25/100, 2 tablets three times per day for 2 days.  See if you feel worse with getting up/down.  If so, go back to four times per day.  The physicians and staff at [facility] are committed to providing excellent care. You may receive a survey requesting feedback about your experience at our office. We strive to receive "very good" responses to the survey questions. If you feel that your experience would prevent you from giving the office a "very good " response, please contact our office to try to remedy the situation. We may be reached at [phone]. Thank you for taking the time out of your busy day to complete the survey.

## 2019-11-20 NOTE — Telephone Encounter (Signed)
I think that his reflex reaction time is too slow for driving and don't recommend that.

## 2019-11-20 NOTE — Telephone Encounter (Signed)
Patient was leaving and once he got to check out he had a question. He wants to know when can he start driving. Advised patient that Dr Tat is with a patient and once I speak with her I will give him a call. He voiced understanding.

## 2019-11-26 DIAGNOSIS — C44612 Basal cell carcinoma of skin of right upper limb, including shoulder: Secondary | ICD-10-CM | POA: Diagnosis not present

## 2019-11-26 DIAGNOSIS — C44619 Basal cell carcinoma of skin of left upper limb, including shoulder: Secondary | ICD-10-CM | POA: Diagnosis not present

## 2019-12-10 ENCOUNTER — Ambulatory Visit: Payer: Medicare Other | Admitting: Cardiology

## 2019-12-12 ENCOUNTER — Other Ambulatory Visit: Payer: Self-pay | Admitting: Neurology

## 2019-12-17 ENCOUNTER — Other Ambulatory Visit: Payer: Self-pay | Admitting: Neurology

## 2019-12-18 NOTE — Telephone Encounter (Signed)
Rx(s) sent to pharmacy electronically.  

## 2019-12-23 ENCOUNTER — Encounter: Payer: Self-pay | Admitting: Cardiology

## 2019-12-23 ENCOUNTER — Ambulatory Visit (INDEPENDENT_AMBULATORY_CARE_PROVIDER_SITE_OTHER): Payer: Medicare Other | Admitting: Cardiology

## 2019-12-23 ENCOUNTER — Other Ambulatory Visit: Payer: Self-pay

## 2019-12-23 VITALS — BP 100/60 | HR 63 | Ht 66.0 in | Wt 129.0 lb

## 2019-12-23 DIAGNOSIS — I350 Nonrheumatic aortic (valve) stenosis: Secondary | ICD-10-CM

## 2019-12-23 DIAGNOSIS — I10 Essential (primary) hypertension: Secondary | ICD-10-CM

## 2019-12-23 DIAGNOSIS — G459 Transient cerebral ischemic attack, unspecified: Secondary | ICD-10-CM | POA: Diagnosis not present

## 2019-12-23 NOTE — Patient Instructions (Signed)
Medication Instructions:  The current medical regimen is effective;  continue present plan and medications.  *If you need a refill on your cardiac medications before your next appointment, please call your pharmacy*  Testing/Procedures: Your physician has requested that you have an echocardiogram. Echocardiography is a painless test that uses sound waves to create images of your heart. It provides your doctor with information about the size and shape of your heart and how well your heart's chambers and valves are working. This procedure takes approximately one hour. There are no restrictions for this procedure.  Follow-Up: At [facility], you and your health needs are our priority.  As part of our continuing mission to provide you with exceptional heart care, we have created designated Provider Care Teams.  These Care Teams include your primary Cardiologist (physician) and Advanced Practice Providers (APPs -  Physician Assistants and Nurse Practitioners) who all work together to provide you with the care you need, when you need it.  We recommend signing up for the patient portal called "MyChart".  Sign up information is provided on this After Visit Summary.  MyChart is used to connect with patients for Virtual Visits (Telemedicine).  Patients are able to view lab/test results, encounter notes, upcoming appointments, etc.  Non-urgent messages can be sent to your provider as well.   To learn more about what you can do with MyChart, go to [email or web address].    Your next appointment:   12 month(s)  The format for your next appointment:   In Person  Provider:   Mark Skains, MD   Thank you for choosing [facility]!!      

## 2019-12-23 NOTE — Progress Notes (Signed)
[facility]. [address], [address] [place], [place]  [address] Phone: [phone] Fax:  [phone]  Date:  12/23/2019   ID:  [name]., DOB July 11, 1940, MRN [id number]  PCP:  Lavone Orn, MD   History of Present Illness: [name]. is a 79 y.o. male with Parkinson's disease status post transcatheter aortic valve replacement #26 mm Edwards's AP and valve on 11/18/13 secondary to severe aortic stenosis.  He is doing very well. Has mild to moderate perivalvular leak. Overall normal ejection fraction. Feeling well. Overall he is doing well without any syncope, no chest pain, no shortness of breath.   Catheterization showed essentially normal coronary arteries.  His Parkinson's has progressed. More difficult to vocalize.  His blood pressure has been low recently. At home sometimes in the 20B systolic. He has not had any syncope. Daughter called. Stopped metoprolol. Agree. Needs to try to consume more liquids, salt. Occasionally may have some chest heaviness at times, may be GERD associated. Heart valve sounds excellent.  12/19/2017-overall been doing quite well.  Parkinson's has been relatively stable.  No chest pain, no shortness of breath, no syncope.  No fevers.  He is taking dental prophylaxis.  12/23/2019-here for the follow-up of TAVR aortic valve.  Overall doing well.  Parkinson's disease has progressed.  He is here with his son.  Denies any fevers chills nausea vomiting syncope bleeding.  See below.  Wt Readings from Last 3 Encounters:  12/23/19 129 lb (58.5 kg)  11/20/19 129 lb (58.5 kg)  01/16/19 128 lb (58.1 kg)     Past Medical History:  Diagnosis Date  . Anxiety   . Aortic stenosis   . Arthritis    left hip  . Cancer ([place])    basal cell- facial?  . Depression   . Diabetes mellitus without complication ([place])   . GERD (gastroesophageal reflux disease)   . Headache(784.0)   . Heart murmur   . Hernia, inguinal, bilateral, recurrent   . Hypertension    . Kidney stones   . Parkinson disease ([place])   . Parkinson's disease (tremor, stiffness, slow motion, unstable posture) ([place]) 12/11/2006  . PONV (postoperative nausea and vomiting)   . Restless leg syndrome   . S/P TAVR (transcatheter aortic valve replacement) 11/18/2013   26 mm Edwards Sapien XT transcatheter heart valve placed via open right transfemoral approach  . TIA (transient ischemic attack) 02/08/2007    Past Surgical History:  Procedure Laterality Date  . CARDIAC CATHETERIZATION  10/09/13  . cataract  2008   bilateral  . COLONOSCOPY    . CYSTOSCOPY/RETROGRADE/URETEROSCOPY  05/29/2011   Procedure: CYSTOSCOPY/RETROGRADE/URETEROSCOPY;  Surgeon: Malka So, MD;  Location: [facility];  Service: Urology;  Laterality: Left;  no stent   . EYE SURGERY Bilateral    /w IOL  . INGUINAL HERNIA REPAIR Bilateral 03/30/2014   Procedure: OPEN BILATERAL INGUINAL HERNIA REPAIR WITH MESH;  Surgeon: Coralie Keens, MD;  Location: [facility];  Service: General;  Laterality: Bilateral;  . INSERTION OF MESH Bilateral 03/30/2014   Procedure: INSERTION OF MESH;  Surgeon: Coralie Keens, MD;  Location: [facility];  Service: General;  Laterality: Bilateral;  . INTRAOPERATIVE TRANSESOPHAGEAL ECHOCARDIOGRAM N/A 11/18/2013   Procedure: INTRAOPERATIVE TRANSESOPHAGEAL ECHOCARDIOGRAM;  Surgeon: Sherren Mocha, MD;  Location[facility];  Service: Open Heart Surgery;  Laterality: N/A;  . LEFT AND RIGHT HEART CATHETERIZATION WITH CORONARY ANGIOGRAM N/A 10/09/2013   Procedure: LEFT AND RIGHT HEART CATHETERIZATION WITH CORONARY ANGIOGRAM;  Surgeon: Blane Ohara, MD;  Location: [facility] CATH LAB;  Service: Cardiovascular;  Laterality: N/A;  . TRANSCATHETER AORTIC VALVE REPLACEMENT, TRANSFEMORAL N/A 11/18/2013   Procedure: TRANSCATHETER AORTIC VALVE REPLACEMENT, TRANSFEMORAL;  Surgeon: Sherren Mocha, MD;  Location: [facility];  Service: Open Heart Surgery;  Laterality: N/A;    Current Outpatient Medications  Medication Sig Dispense Refill    . Acetaminophen (TYLENOL ARTHRITIS PAIN PO) Take by mouth as needed.    Marland Kitchen aspirin EC 81 MG tablet Take 1 tablet (81 mg total) by mouth daily.    . carbidopa-levodopa (SINEMET IR) 25-100 MG tablet TAKE 2 TABLETS BY MOUTH 4 (FOUR) TIMES DAILY. (Patient taking differently: Take 2 tablets by mouth 3 (three) times daily. ) 670 tablet 1  . cholecalciferol (VITAMIN D) 1000 units tablet Take 1 tablet (1,000 Units total) by mouth at bedtime. 30 tablet 1  . clonazePAM (KLONOPIN) 0.5 MG tablet TAKE 1 TABLET BY MOUTH AT BEDTIME. 90 tablet 1  . donepezil (ARICEPT) 10 MG tablet TAKE 1 TABLET BY MOUTH EVERYDAY AT BEDTIME 90 tablet 0  . midodrine (PROAMATINE) 2.5 MG tablet TAKE 1 TABLET (2.5 MG TOTAL) BY MOUTH 2 (TWO) TIMES DAILY WITH A MEAL. 180 tablet 1  . MYRBETRIQ 50 MG TB24 tablet Take 1 tablet (50 mg total) by mouth daily. 30 tablet 11   No current facility-administered medications for this visit.    Allergies:    Allergies  Allergen Reactions  . Penicillins     Unsure of reaction  . Vicodin [Hydrocodone-Acetaminophen] Other (See Comments)    "Mental reaction"    Social History:  The patient  reports that he has never smoked. He has never used smokeless tobacco. He reports that he does not drink alcohol and does not use drugs.   Family History  Problem Relation Age of Onset  . Arthritis/Rheumatoid Mother   . Prostate cancer Father   . Anemia Daughter   . Asthma Daughter   . Diabetes Daughter   . Hypertension Daughter   . Kidney disease Daughter   . Cancer Son   . Breast cancer Sister   . Heart attack Neg Hx   . Stroke Neg Hx     ROS:  Please see the history of present illness.   Denies any fevers, chills, orthopnea, PND   question of fatigue, chest pressure. All other systems reviewed and negative.   PHYSICAL EXAM: VS:  BP 100/60   Pulse 63   Ht 5\' 6"  (1.676 m)   Wt 129 lb (58.5 kg)   SpO2 97%   BMI 20.82 kg/m  GEN: Thin, in no acute distress  HEENT: normal  Neck: no JVD,  carotid bruits, or masses Cardiac: RRR; 2/6 systolic murmurs, no rubs, or gallops,no edema  Respiratory:  clear to auscultation bilaterally, normal work of breathing GI: soft, nontender, nondistended, + BS MS: no deformity or atrophy  Skin: warm and dry, no rash Neuro:  Alert parkinsonian Psych: euthymic mood, full affect    EKG: EKG today 12/23/2019-sinus rhythm 63 with no other abnormalities.  01/11/16-sinus bradycardia rate 56 with no other abnormalities.     ECHO 12/10/2014: - Left ventricle: The cavity size was normal. Wall thickness was   normal. Systolic function was normal. The estimated ejection   fraction was in the range of 60% to 65%. Wall motion was normal;   there were no regional wall motion abnormalities. Doppler   parameters are consistent with abnormal left ventricular   relaxation (grade 1 diastolic dysfunction). -  Aortic valve: A bioprosthesis was present. There was mild   regurgitation. - Mitral valve: Calcified annulus. - Left atrium: The atrium was moderately dilated. - Right atrium: The atrium was mildly dilated. - Atrial septum: There was an atrial septal aneurysm. - Pulmonary arteries: Systolic pressure was mildly increased.  Impressions:  - Normal LV function with grade 1 diastolic dysfunction; moderate   LAE; mild RAE; s/p TAVR with what appears to be 2 sites of mild   periprosthetic AI; normal mean gradient of 14 mmHg; mild TR with   mildly elevated pulmonary pressures. Compared to 12/12/13, AI   appears to be unchanged.  ECHO 2019:  - Left ventricle: The cavity size was normal. There was moderate  concentric hypertrophy. Systolic function was normal. The  estimated ejection fraction was in the range of 60% to 65%. Wall  motion was normal; there were no regional wall motion  abnormalities. There was an increased relative contribution of  atrial contraction to ventricular filling. Doppler parameters are  consistent with abnormal left  ventricular relaxation (grade 1  diastolic dysfunction).  - Aortic valve: S/P 14mm Edwards Sapien AVR that is normal  functioning with mild perivalvular AI. Mean gradient (S): 12 mm  Hg. Valve area (VTI): 1.81 cm^2. Valve area (Vmax): 1.69 cm^2.  Valve area (Vmean): 1.62 cm^2.  - Mitral valve: Calcified annulus. There was trivial regurgitation.  Valve area by pressure half-time: 2.39 cm^2.  - Right atrium: The atrium was moderately dilated.     ASSESSMENT AND PLAN:  1. History of aortic valve replacement via TAVR July 2015- doing very well, mild  perivalvular leak. Echocardiogram reviewed.  We will go ahead and check another echocardiogram since it has been over 2 years.  Asymptomatic. He should continue with his aspirin 81 mg. We will continue with low-dose metoprolol.  Reviewed cardiac catheterization which showed normal/no flow-limiting coronary artery disease.   Subtle murmur heard on exam.  Continue to follow clinically.  2. Parkinson's disease-neurology.  Dr. Carles Collet.  No changes 3. Hypotension- continue to monitor.  Daughter called, stopped metoprolol dose previously. I agree. Salt liberalization, fluids. His autonomic dysfunction with Parkinson's will make him prone for hypotension. Be careful when getting up. Mild bradycardia noted on ECG should not be an issue. Encouraged protein.  Midodrine 4. Reminded him of dental prophylaxis.  His son is present. 5. We will see him back in 12 months.  We will follow-up with results of echo. Signed, Candee Furbish, MD [facility]  12/23/2019 4:06 PM

## 2020-01-02 ENCOUNTER — Other Ambulatory Visit: Payer: Self-pay | Admitting: Neurology

## 2020-01-08 ENCOUNTER — Other Ambulatory Visit (HOSPITAL_COMMUNITY): Payer: Medicare Other

## 2020-01-09 DIAGNOSIS — C44612 Basal cell carcinoma of skin of right upper limb, including shoulder: Secondary | ICD-10-CM | POA: Diagnosis not present

## 2020-01-14 ENCOUNTER — Other Ambulatory Visit: Payer: Self-pay

## 2020-01-14 ENCOUNTER — Ambulatory Visit (HOSPITAL_COMMUNITY): Payer: Medicare Other | Attending: Cardiovascular Disease

## 2020-01-14 DIAGNOSIS — I10 Essential (primary) hypertension: Secondary | ICD-10-CM | POA: Diagnosis not present

## 2020-01-14 DIAGNOSIS — I350 Nonrheumatic aortic (valve) stenosis: Secondary | ICD-10-CM | POA: Insufficient documentation

## 2020-01-14 LAB — ECHOCARDIOGRAM COMPLETE
AR max vel: 1.45 cm2
AV Area VTI: 1.68 cm2
AV Area mean vel: 1.44 cm2
AV Mean grad: 16 mmHg
AV Peak grad: 30.9 mmHg
Ao pk vel: 2.78 m/s
Area-P 1/2: 2.37 cm2
P 1/2 time: 592 msec
S' Lateral: 1.9 cm

## 2020-01-24 ENCOUNTER — Other Ambulatory Visit: Payer: Self-pay | Admitting: Neurology

## 2020-03-04 ENCOUNTER — Other Ambulatory Visit: Payer: Self-pay | Admitting: Neurology

## 2020-03-25 DIAGNOSIS — Z23 Encounter for immunization: Secondary | ICD-10-CM | POA: Diagnosis not present

## 2020-04-07 DIAGNOSIS — Z23 Encounter for immunization: Secondary | ICD-10-CM | POA: Diagnosis not present

## 2020-04-21 ENCOUNTER — Other Ambulatory Visit: Payer: Self-pay | Admitting: Neurology

## 2020-05-21 NOTE — Progress Notes (Signed)
Virtual Visit Via Video   The purpose of this virtual visit is to provide medical care while limiting exposure to the novel coronavirus.    Consent was obtained for video visit:  Yes.   Answered questions that patient had about telehealth interaction:  Yes.   I discussed the limitations, risks, security and privacy concerns of performing an evaluation and management service by telemedicine. I also discussed with the patient that there may be a patient responsible charge related to this service. The patient expressed understanding and agreed to proceed.  Pt location: Home Physician Location: office Name of referring provider:  Lavone Orn, MD I connected with Bryce Garbe. at patients initiation/request on 05/25/2020 at  3:30 PM EST by video enabled telemedicine application and verified that I am speaking with the correct person using two identifiers. Pt MRN:  [id number] Pt DOB:  24-Jan-1941 Video Participants:  Bryce Garbe.;  Daughter supplements hx  Assessment/Plan:   1.  Parkinsons Disease  -Continue carbidopa/levodopa 25/100, 2 tablets 3 times per day.  -Patient does follow with dermatology.  Understands that Parkinson's slightly increases risk for melanoma.  Has had history of basal cell.  -discussed home PT.  Pt agreeable  -discussed pt should not be driving.  He asked me again (and has the last several visits)  2.  PDD  -Continue donepezil, 10 mg daily  3.  RBD  -Continue clonazepam, 0.5 mg at bedtime.  4.  Neurogenic Orthostatic Hypotension  -Continue midodrine, 2.5 mg twice daily.  5.  Urinary frequency  -Follow-up urology.  They stated today that they would let me know when they need a referral, but admits that they have not yet followed through with pursuing urology yet.   Subjective:   Bryce Garbe. was seen today in follow up for Parkinsons disease.  My previous records were reviewed prior to todays visit.  There were a few falls - one was  in room at night and the nightlight was off.  He got a scrape on elbow.  Another time, he fell twice in evening.  He fell in kitchen that day and in the bathroom.  He has slid out of the lift chair while sleeping a few times.   Last visit, we reduced his levodopa from 4 times per day to 3 times per day at their request and they state today that he has been stable with the lower dosage. pt denies significant lightheadedness, near syncope.  Blood pressure is better than it used to be when they are following it.  Saw dermatology since last visit.  Saw cardiology.  Those records are reviewed.  Current prescribed movement disorder medications: Carbidopa/levodopa 25/100, 2 tablets 3 times daily (decreased last visit) Clonazepam 0.5 mg daily Donepezil 10 mg daily Midodrine, 2.5 mg twice per day   ALLERGIES:   Allergies  Allergen Reactions  . Penicillins     Unsure of reaction  . Vicodin [Hydrocodone-Acetaminophen] Other (See Comments)    "Mental reaction"    CURRENT MEDICATIONS:  Outpatient Encounter Medications as of 05/25/2020  Medication Sig  . Acetaminophen (TYLENOL ARTHRITIS PAIN PO) Take by mouth as needed.  Marland Kitchen aspirin EC 81 MG tablet Take 1 tablet (81 mg total) by mouth daily.  . cholecalciferol (VITAMIN D) 1000 units tablet Take 1 tablet (1,000 Units total) by mouth at bedtime.  Marland Kitchen MYRBETRIQ 50 MG TB24 tablet Take 1 tablet (50 mg total) by mouth daily.  . [DISCONTINUED] carbidopa-levodopa (SINEMET IR) 25-100 MG  tablet TAKE 2 TABLETS BY MOUTH 4 (FOUR) TIMES DAILY. (Patient taking differently: Take 2 tablets by mouth 3 (three) times daily.)  . [DISCONTINUED] clonazePAM (KLONOPIN) 0.5 MG tablet TAKE 1 TABLET BY MOUTH AT BEDTIME.  . [DISCONTINUED] donepezil (ARICEPT) 10 MG tablet TAKE 1 TABLET BY MOUTH EVERYDAY AT BEDTIME  . [DISCONTINUED] midodrine (PROAMATINE) 2.5 MG tablet TAKE 1 TABLET (2.5 MG TOTAL) BY MOUTH 2 (TWO) TIMES DAILY WITH A MEAL.  . carbidopa-levodopa (SINEMET IR) 25-100 MG  tablet Take 2 tablets by mouth 3 (three) times daily.  . clonazePAM (KLONOPIN) 0.5 MG tablet Take 1 tablet (0.5 mg total) by mouth at bedtime.  . donepezil (ARICEPT) 10 MG tablet Take 1 tablet (10 mg total) by mouth at bedtime.  . midodrine (PROAMATINE) 2.5 MG tablet Take 1 tablet (2.5 mg total) by mouth 2 (two) times daily with a meal.   No facility-administered encounter medications on file as of 05/25/2020.    Objective:   PHYSICAL EXAMINATION:    VITALS:   Vitals:   05/25/20 1358  Weight: 130 lb (59 kg)  Height: 5\' 5"  (1.651 m)    GEN:  The patient appears stated age and is in NAD. HEENT:  Normocephalic, atraumatic.  The mucous membranes are moist.   Neurological examination:  Orientation: The patient is alert and oriented x3. Cranial nerves: There is good facial symmetry with facial hypomimia. The speech is fluent and mildly dysarthric and hypophonic.  Motor: Strength is at least antigravity x4.  Movement examination: Abnormal movements: None Coordination:  There is  decremation with RAM's, on the right >L Gait and Station: not testing  I have reviewed and interpreted the following labs independently    Chemistry      Component Value Date/Time   NA 140 01/16/2019 1613   K 4.8 01/16/2019 1613   CL 104 01/16/2019 1613   CO2 28 01/16/2019 1613   BUN 20 01/16/2019 1613   CREATININE 1.11 01/16/2019 1613      Component Value Date/Time   CALCIUM 9.3 01/16/2019 1613   ALKPHOS 91 04/16/2018 0447   AST 13 01/16/2019 1613   ALT 3 (L) 01/16/2019 1613   BILITOT 0.5 01/16/2019 1613       Lab Results  Component Value Date   WBC 4.5 01/16/2019   HGB 12.7 (L) 01/16/2019   HCT 38.3 (L) 01/16/2019   MCV 88.9 01/16/2019   PLT 105 (L) 01/16/2019    Lab Results  Component Value Date   TSH 1.57 11/16/2017   Follow up Instructions      -I discussed the assessment and treatment plan with the patient. The patient was provided an opportunity to ask questions and all  were answered. The patient agreed with the plan and demonstrated an understanding of the instructions.   The patient was advised to call back or seek an in-person evaluation if the symptoms worsen or if the condition fails to improve as anticipated.    Total time spent on today's visit was 30 minutes, including both face-to-face time and nonface-to-face time.  Time included that spent on review of records (prior notes available to me/labs/imaging if pertinent), discussing treatment and goals, answering patient's questions and coordinating   Cc:  Lavone Orn, MD

## 2020-05-25 ENCOUNTER — Other Ambulatory Visit: Payer: Self-pay

## 2020-05-25 ENCOUNTER — Encounter: Payer: Self-pay | Admitting: Neurology

## 2020-05-25 ENCOUNTER — Telehealth (INDEPENDENT_AMBULATORY_CARE_PROVIDER_SITE_OTHER): Payer: Medicare Other | Admitting: Neurology

## 2020-05-25 VITALS — Ht 65.0 in | Wt 130.0 lb

## 2020-05-25 DIAGNOSIS — F0280 Dementia in other diseases classified elsewhere without behavioral disturbance: Secondary | ICD-10-CM | POA: Diagnosis not present

## 2020-05-25 DIAGNOSIS — G903 Multi-system degeneration of the autonomic nervous system: Secondary | ICD-10-CM

## 2020-05-25 DIAGNOSIS — G20 Parkinson's disease: Secondary | ICD-10-CM

## 2020-05-25 MED ORDER — CLONAZEPAM 0.5 MG PO TABS: 0.5000 mg | ORAL_TABLET | Freq: Every day | ORAL | 1 refills | Status: DC

## 2020-05-25 MED ORDER — DONEPEZIL HCL 10 MG PO TABS: 10.0000 mg | ORAL_TABLET | Freq: Every day | ORAL | 1 refills | Status: DC

## 2020-05-25 MED ORDER — MIDODRINE HCL 2.5 MG PO TABS: 2.5000 mg | ORAL_TABLET | Freq: Two times a day (BID) | ORAL | 1 refills | Status: DC

## 2020-05-25 MED ORDER — CARBIDOPA-LEVODOPA 25-100 MG PO TABS: 2.0000 | ORAL_TABLET | Freq: Three times a day (TID) | ORAL | 1 refills | Status: DC

## 2020-06-02 NOTE — Telephone Encounter (Signed)
Hey im not sure what happened for this one. You may have to wait and ask Tee. I don't do authorizations for Physical therapy. Usually the place where they go does the auth if they need one. If I remember that correctly. And it looks like she [facility] to [facility] which is an external referral so I can't see any notes. I'm not sure how that all works.

## 2020-06-24 NOTE — Telephone Encounter (Signed)
Tee, please send anywhere (medi [facility] is fine) but call the referral and make sure they got it.  Its been a long time.  thanks

## 2020-06-25 ENCOUNTER — Other Ambulatory Visit: Payer: Self-pay

## 2020-06-25 DIAGNOSIS — G20 Parkinson's disease: Secondary | ICD-10-CM

## 2020-06-28 DIAGNOSIS — F0280 Dementia in other diseases classified elsewhere without behavioral disturbance: Secondary | ICD-10-CM | POA: Diagnosis not present

## 2020-06-28 DIAGNOSIS — G4752 REM sleep behavior disorder: Secondary | ICD-10-CM | POA: Diagnosis not present

## 2020-06-28 DIAGNOSIS — R350 Frequency of micturition: Secondary | ICD-10-CM | POA: Diagnosis not present

## 2020-06-28 DIAGNOSIS — G20 Parkinson's disease: Secondary | ICD-10-CM | POA: Diagnosis not present

## 2020-06-28 DIAGNOSIS — I9589 Other hypotension: Secondary | ICD-10-CM | POA: Diagnosis not present

## 2020-06-29 ENCOUNTER — Other Ambulatory Visit: Payer: Self-pay | Admitting: Neurology

## 2020-07-02 DIAGNOSIS — G4752 REM sleep behavior disorder: Secondary | ICD-10-CM | POA: Diagnosis not present

## 2020-07-02 DIAGNOSIS — I9589 Other hypotension: Secondary | ICD-10-CM | POA: Diagnosis not present

## 2020-07-02 DIAGNOSIS — R350 Frequency of micturition: Secondary | ICD-10-CM | POA: Diagnosis not present

## 2020-07-02 DIAGNOSIS — G20 Parkinson's disease: Secondary | ICD-10-CM | POA: Diagnosis not present

## 2020-07-02 DIAGNOSIS — F0280 Dementia in other diseases classified elsewhere without behavioral disturbance: Secondary | ICD-10-CM | POA: Diagnosis not present

## 2020-07-06 DIAGNOSIS — G4752 REM sleep behavior disorder: Secondary | ICD-10-CM | POA: Diagnosis not present

## 2020-07-06 DIAGNOSIS — F0280 Dementia in other diseases classified elsewhere without behavioral disturbance: Secondary | ICD-10-CM | POA: Diagnosis not present

## 2020-07-06 DIAGNOSIS — I9589 Other hypotension: Secondary | ICD-10-CM | POA: Diagnosis not present

## 2020-07-06 DIAGNOSIS — G20 Parkinson's disease: Secondary | ICD-10-CM | POA: Diagnosis not present

## 2020-07-06 DIAGNOSIS — R350 Frequency of micturition: Secondary | ICD-10-CM | POA: Diagnosis not present

## 2020-07-08 DIAGNOSIS — G20 Parkinson's disease: Secondary | ICD-10-CM | POA: Diagnosis not present

## 2020-07-08 DIAGNOSIS — R350 Frequency of micturition: Secondary | ICD-10-CM | POA: Diagnosis not present

## 2020-07-08 DIAGNOSIS — G4752 REM sleep behavior disorder: Secondary | ICD-10-CM | POA: Diagnosis not present

## 2020-07-08 DIAGNOSIS — F0280 Dementia in other diseases classified elsewhere without behavioral disturbance: Secondary | ICD-10-CM | POA: Diagnosis not present

## 2020-07-08 DIAGNOSIS — I9589 Other hypotension: Secondary | ICD-10-CM | POA: Diagnosis not present

## 2020-07-13 DIAGNOSIS — G20 Parkinson's disease: Secondary | ICD-10-CM | POA: Diagnosis not present

## 2020-07-13 DIAGNOSIS — R350 Frequency of micturition: Secondary | ICD-10-CM | POA: Diagnosis not present

## 2020-07-13 DIAGNOSIS — I9589 Other hypotension: Secondary | ICD-10-CM | POA: Diagnosis not present

## 2020-07-13 DIAGNOSIS — F0280 Dementia in other diseases classified elsewhere without behavioral disturbance: Secondary | ICD-10-CM | POA: Diagnosis not present

## 2020-07-13 DIAGNOSIS — G4752 REM sleep behavior disorder: Secondary | ICD-10-CM | POA: Diagnosis not present

## 2020-07-15 DIAGNOSIS — R350 Frequency of micturition: Secondary | ICD-10-CM | POA: Diagnosis not present

## 2020-07-15 DIAGNOSIS — G20 Parkinson's disease: Secondary | ICD-10-CM | POA: Diagnosis not present

## 2020-07-15 DIAGNOSIS — I9589 Other hypotension: Secondary | ICD-10-CM | POA: Diagnosis not present

## 2020-07-15 DIAGNOSIS — F0280 Dementia in other diseases classified elsewhere without behavioral disturbance: Secondary | ICD-10-CM | POA: Diagnosis not present

## 2020-07-15 DIAGNOSIS — G4752 REM sleep behavior disorder: Secondary | ICD-10-CM | POA: Diagnosis not present

## 2020-07-20 DIAGNOSIS — G4752 REM sleep behavior disorder: Secondary | ICD-10-CM | POA: Diagnosis not present

## 2020-07-20 DIAGNOSIS — F0280 Dementia in other diseases classified elsewhere without behavioral disturbance: Secondary | ICD-10-CM | POA: Diagnosis not present

## 2020-07-20 DIAGNOSIS — R350 Frequency of micturition: Secondary | ICD-10-CM | POA: Diagnosis not present

## 2020-07-20 DIAGNOSIS — G20 Parkinson's disease: Secondary | ICD-10-CM | POA: Diagnosis not present

## 2020-07-20 DIAGNOSIS — I9589 Other hypotension: Secondary | ICD-10-CM | POA: Diagnosis not present

## 2020-07-22 ENCOUNTER — Other Ambulatory Visit: Payer: Self-pay | Admitting: Neurology

## 2020-07-22 DIAGNOSIS — R350 Frequency of micturition: Secondary | ICD-10-CM | POA: Diagnosis not present

## 2020-07-22 DIAGNOSIS — F0280 Dementia in other diseases classified elsewhere without behavioral disturbance: Secondary | ICD-10-CM | POA: Diagnosis not present

## 2020-07-22 DIAGNOSIS — G20 Parkinson's disease: Secondary | ICD-10-CM | POA: Diagnosis not present

## 2020-07-22 DIAGNOSIS — I9589 Other hypotension: Secondary | ICD-10-CM | POA: Diagnosis not present

## 2020-07-22 DIAGNOSIS — G4752 REM sleep behavior disorder: Secondary | ICD-10-CM | POA: Diagnosis not present

## 2020-07-23 ENCOUNTER — Telehealth: Payer: Self-pay

## 2020-07-23 NOTE — Telephone Encounter (Signed)
Received a voicemail from [employer] at [facility] and she was calling to extend home health for 1 times a week for 5 weeks starting next week.    Left verbal orders on voicemail for

## 2020-07-28 DIAGNOSIS — G20 Parkinson's disease: Secondary | ICD-10-CM | POA: Diagnosis not present

## 2020-07-28 DIAGNOSIS — R350 Frequency of micturition: Secondary | ICD-10-CM | POA: Diagnosis not present

## 2020-07-28 DIAGNOSIS — F0280 Dementia in other diseases classified elsewhere without behavioral disturbance: Secondary | ICD-10-CM | POA: Diagnosis not present

## 2020-07-28 DIAGNOSIS — G4752 REM sleep behavior disorder: Secondary | ICD-10-CM | POA: Diagnosis not present

## 2020-07-28 DIAGNOSIS — I9589 Other hypotension: Secondary | ICD-10-CM | POA: Diagnosis not present

## 2020-07-29 DIAGNOSIS — G20 Parkinson's disease: Secondary | ICD-10-CM | POA: Diagnosis not present

## 2020-07-29 DIAGNOSIS — I9589 Other hypotension: Secondary | ICD-10-CM | POA: Diagnosis not present

## 2020-07-29 DIAGNOSIS — G4752 REM sleep behavior disorder: Secondary | ICD-10-CM | POA: Diagnosis not present

## 2020-07-29 DIAGNOSIS — R350 Frequency of micturition: Secondary | ICD-10-CM | POA: Diagnosis not present

## 2020-07-29 DIAGNOSIS — F0280 Dementia in other diseases classified elsewhere without behavioral disturbance: Secondary | ICD-10-CM | POA: Diagnosis not present

## 2020-08-03 DIAGNOSIS — Z961 Presence of intraocular lens: Secondary | ICD-10-CM | POA: Diagnosis not present

## 2020-08-03 DIAGNOSIS — H35363 Drusen (degenerative) of macula, bilateral: Secondary | ICD-10-CM | POA: Diagnosis not present

## 2020-08-03 DIAGNOSIS — H04123 Dry eye syndrome of bilateral lacrimal glands: Secondary | ICD-10-CM | POA: Diagnosis not present

## 2020-08-03 DIAGNOSIS — H40013 Open angle with borderline findings, low risk, bilateral: Secondary | ICD-10-CM | POA: Diagnosis not present

## 2020-08-06 DIAGNOSIS — G20 Parkinson's disease: Secondary | ICD-10-CM | POA: Diagnosis not present

## 2020-08-06 DIAGNOSIS — F0280 Dementia in other diseases classified elsewhere without behavioral disturbance: Secondary | ICD-10-CM | POA: Diagnosis not present

## 2020-08-06 DIAGNOSIS — R350 Frequency of micturition: Secondary | ICD-10-CM | POA: Diagnosis not present

## 2020-08-06 DIAGNOSIS — G4752 REM sleep behavior disorder: Secondary | ICD-10-CM | POA: Diagnosis not present

## 2020-08-06 DIAGNOSIS — I9589 Other hypotension: Secondary | ICD-10-CM | POA: Diagnosis not present

## 2020-08-12 DIAGNOSIS — I9589 Other hypotension: Secondary | ICD-10-CM | POA: Diagnosis not present

## 2020-08-12 DIAGNOSIS — G20 Parkinson's disease: Secondary | ICD-10-CM | POA: Diagnosis not present

## 2020-08-12 DIAGNOSIS — F0280 Dementia in other diseases classified elsewhere without behavioral disturbance: Secondary | ICD-10-CM | POA: Diagnosis not present

## 2020-08-12 DIAGNOSIS — R350 Frequency of micturition: Secondary | ICD-10-CM | POA: Diagnosis not present

## 2020-08-12 DIAGNOSIS — G4752 REM sleep behavior disorder: Secondary | ICD-10-CM | POA: Diagnosis not present

## 2020-08-25 ENCOUNTER — Telehealth: Payer: Self-pay

## 2020-08-25 DIAGNOSIS — G20 Parkinson's disease: Secondary | ICD-10-CM | POA: Diagnosis not present

## 2020-08-25 DIAGNOSIS — G4752 REM sleep behavior disorder: Secondary | ICD-10-CM | POA: Diagnosis not present

## 2020-08-25 DIAGNOSIS — F0280 Dementia in other diseases classified elsewhere without behavioral disturbance: Secondary | ICD-10-CM | POA: Diagnosis not present

## 2020-08-25 DIAGNOSIS — I9589 Other hypotension: Secondary | ICD-10-CM | POA: Diagnosis not present

## 2020-08-25 DIAGNOSIS — R350 Frequency of micturition: Secondary | ICD-10-CM | POA: Diagnosis not present

## 2020-08-25 NOTE — Telephone Encounter (Signed)
Spoke with Charrie a physical therapist calling to get verbal orders for the patient put patient in the maintance (extra therapy) program.  She states the patient has fell last week.

## 2020-08-26 NOTE — Telephone Encounter (Signed)
ok 

## 2020-08-27 DIAGNOSIS — G20 Parkinson's disease: Secondary | ICD-10-CM | POA: Diagnosis not present

## 2020-08-27 DIAGNOSIS — R350 Frequency of micturition: Secondary | ICD-10-CM | POA: Diagnosis not present

## 2020-08-27 DIAGNOSIS — F0280 Dementia in other diseases classified elsewhere without behavioral disturbance: Secondary | ICD-10-CM | POA: Diagnosis not present

## 2020-08-27 DIAGNOSIS — I9589 Other hypotension: Secondary | ICD-10-CM | POA: Diagnosis not present

## 2020-08-27 DIAGNOSIS — G4752 REM sleep behavior disorder: Secondary | ICD-10-CM | POA: Diagnosis not present

## 2020-08-31 DIAGNOSIS — H6123 Impacted cerumen, bilateral: Secondary | ICD-10-CM | POA: Diagnosis not present

## 2020-09-03 DIAGNOSIS — G20 Parkinson's disease: Secondary | ICD-10-CM | POA: Diagnosis not present

## 2020-09-03 DIAGNOSIS — G4752 REM sleep behavior disorder: Secondary | ICD-10-CM | POA: Diagnosis not present

## 2020-09-03 DIAGNOSIS — F0280 Dementia in other diseases classified elsewhere without behavioral disturbance: Secondary | ICD-10-CM | POA: Diagnosis not present

## 2020-09-03 DIAGNOSIS — I9589 Other hypotension: Secondary | ICD-10-CM | POA: Diagnosis not present

## 2020-09-03 DIAGNOSIS — R350 Frequency of micturition: Secondary | ICD-10-CM | POA: Diagnosis not present

## 2020-09-10 DIAGNOSIS — I9589 Other hypotension: Secondary | ICD-10-CM | POA: Diagnosis not present

## 2020-09-10 DIAGNOSIS — F0280 Dementia in other diseases classified elsewhere without behavioral disturbance: Secondary | ICD-10-CM | POA: Diagnosis not present

## 2020-09-10 DIAGNOSIS — G20 Parkinson's disease: Secondary | ICD-10-CM | POA: Diagnosis not present

## 2020-09-10 DIAGNOSIS — G4752 REM sleep behavior disorder: Secondary | ICD-10-CM | POA: Diagnosis not present

## 2020-09-10 DIAGNOSIS — R350 Frequency of micturition: Secondary | ICD-10-CM | POA: Diagnosis not present

## 2020-09-15 DIAGNOSIS — G4752 REM sleep behavior disorder: Secondary | ICD-10-CM | POA: Diagnosis not present

## 2020-09-15 DIAGNOSIS — I9589 Other hypotension: Secondary | ICD-10-CM | POA: Diagnosis not present

## 2020-09-15 DIAGNOSIS — F0280 Dementia in other diseases classified elsewhere without behavioral disturbance: Secondary | ICD-10-CM | POA: Diagnosis not present

## 2020-09-15 DIAGNOSIS — G20 Parkinson's disease: Secondary | ICD-10-CM | POA: Diagnosis not present

## 2020-09-15 DIAGNOSIS — R350 Frequency of micturition: Secondary | ICD-10-CM | POA: Diagnosis not present

## 2020-09-21 DIAGNOSIS — R350 Frequency of micturition: Secondary | ICD-10-CM | POA: Diagnosis not present

## 2020-09-21 DIAGNOSIS — G4752 REM sleep behavior disorder: Secondary | ICD-10-CM | POA: Diagnosis not present

## 2020-09-21 DIAGNOSIS — G20 Parkinson's disease: Secondary | ICD-10-CM | POA: Diagnosis not present

## 2020-09-21 DIAGNOSIS — I9589 Other hypotension: Secondary | ICD-10-CM | POA: Diagnosis not present

## 2020-09-21 DIAGNOSIS — F0280 Dementia in other diseases classified elsewhere without behavioral disturbance: Secondary | ICD-10-CM | POA: Diagnosis not present

## 2020-09-23 DIAGNOSIS — Z0279 Encounter for issue of other medical certificate: Secondary | ICD-10-CM

## 2020-09-26 DIAGNOSIS — G20 Parkinson's disease: Secondary | ICD-10-CM | POA: Diagnosis not present

## 2020-09-26 DIAGNOSIS — F0280 Dementia in other diseases classified elsewhere without behavioral disturbance: Secondary | ICD-10-CM | POA: Diagnosis not present

## 2020-09-26 DIAGNOSIS — G4752 REM sleep behavior disorder: Secondary | ICD-10-CM | POA: Diagnosis not present

## 2020-09-26 DIAGNOSIS — R350 Frequency of micturition: Secondary | ICD-10-CM | POA: Diagnosis not present

## 2020-09-26 DIAGNOSIS — I9589 Other hypotension: Secondary | ICD-10-CM | POA: Diagnosis not present

## 2020-10-01 ENCOUNTER — Telehealth: Payer: Self-pay | Admitting: Neurology

## 2020-10-01 DIAGNOSIS — I9589 Other hypotension: Secondary | ICD-10-CM | POA: Diagnosis not present

## 2020-10-01 DIAGNOSIS — F0280 Dementia in other diseases classified elsewhere without behavioral disturbance: Secondary | ICD-10-CM | POA: Diagnosis not present

## 2020-10-01 DIAGNOSIS — G20 Parkinson's disease: Secondary | ICD-10-CM | POA: Diagnosis not present

## 2020-10-01 DIAGNOSIS — R350 Frequency of micturition: Secondary | ICD-10-CM | POA: Diagnosis not present

## 2020-10-01 DIAGNOSIS — G4752 REM sleep behavior disorder: Secondary | ICD-10-CM | POA: Diagnosis not present

## 2020-10-01 NOTE — Telephone Encounter (Signed)
Refaxed to city of [place]

## 2020-10-01 NOTE — Telephone Encounter (Signed)
Domenic Polite, RN from [place] called and requested the patient's daughter's FMLA forms need re-faxed. They did not come completely through.   Fax: [phone]

## 2020-10-07 DIAGNOSIS — F0280 Dementia in other diseases classified elsewhere without behavioral disturbance: Secondary | ICD-10-CM | POA: Diagnosis not present

## 2020-10-07 DIAGNOSIS — I9589 Other hypotension: Secondary | ICD-10-CM | POA: Diagnosis not present

## 2020-10-07 DIAGNOSIS — G4752 REM sleep behavior disorder: Secondary | ICD-10-CM | POA: Diagnosis not present

## 2020-10-07 DIAGNOSIS — R350 Frequency of micturition: Secondary | ICD-10-CM | POA: Diagnosis not present

## 2020-10-07 DIAGNOSIS — G20 Parkinson's disease: Secondary | ICD-10-CM | POA: Diagnosis not present

## 2020-10-15 DIAGNOSIS — G20 Parkinson's disease: Secondary | ICD-10-CM | POA: Diagnosis not present

## 2020-10-15 DIAGNOSIS — G4752 REM sleep behavior disorder: Secondary | ICD-10-CM | POA: Diagnosis not present

## 2020-10-15 DIAGNOSIS — F0280 Dementia in other diseases classified elsewhere without behavioral disturbance: Secondary | ICD-10-CM | POA: Diagnosis not present

## 2020-10-15 DIAGNOSIS — I9589 Other hypotension: Secondary | ICD-10-CM | POA: Diagnosis not present

## 2020-10-15 DIAGNOSIS — R350 Frequency of micturition: Secondary | ICD-10-CM | POA: Diagnosis not present

## 2020-10-25 DIAGNOSIS — R350 Frequency of micturition: Secondary | ICD-10-CM | POA: Diagnosis not present

## 2020-10-25 DIAGNOSIS — G4752 REM sleep behavior disorder: Secondary | ICD-10-CM | POA: Diagnosis not present

## 2020-10-25 DIAGNOSIS — L600 Ingrowing nail: Secondary | ICD-10-CM | POA: Diagnosis not present

## 2020-10-25 DIAGNOSIS — I9589 Other hypotension: Secondary | ICD-10-CM | POA: Diagnosis not present

## 2020-10-25 DIAGNOSIS — F0280 Dementia in other diseases classified elsewhere without behavioral disturbance: Secondary | ICD-10-CM | POA: Diagnosis not present

## 2020-10-25 DIAGNOSIS — G20 Parkinson's disease: Secondary | ICD-10-CM | POA: Diagnosis not present

## 2020-11-01 ENCOUNTER — Ambulatory Visit: Payer: BLUE CROSS/BLUE SHIELD | Admitting: Podiatry

## 2020-11-03 ENCOUNTER — Other Ambulatory Visit: Payer: Self-pay | Admitting: Neurology

## 2020-11-11 ENCOUNTER — Ambulatory Visit (INDEPENDENT_AMBULATORY_CARE_PROVIDER_SITE_OTHER): Payer: Medicare Other | Admitting: Podiatry

## 2020-11-11 ENCOUNTER — Other Ambulatory Visit: Payer: Self-pay

## 2020-11-11 ENCOUNTER — Encounter: Payer: Self-pay | Admitting: Podiatry

## 2020-11-11 DIAGNOSIS — L600 Ingrowing nail: Secondary | ICD-10-CM

## 2020-11-11 NOTE — Progress Notes (Signed)
  Subjective:  Patient ID: Bryce Garbe., male    DOB: Sep 24, 1940,  MRN: [id number]  Chief Complaint  Patient presents with   Toe Pain     (np) right foot great toe    80 y.o. male presents with the above complaint. History confirmed with patient.  Has been getting worse over the last couple weeks.  He took a course of doxycycline prescribed his PCP.  Objective:  Physical Exam: warm, good capillary refill, no trophic changes or ulcerative lesions, normal DP and PT pulses, and normal sensory exam. Right Foot: Ingrown nail medial hallux, quite painful to palpation, no active purulence or cellulitis  Assessment:   1. Ingrowing right great toenail      Plan:  Patient was evaluated and treated and all questions answered.    Ingrown Nail, right -Patient elects to proceed with minor surgery to remove ingrown toenail today. Consent reviewed and signed by patient. -Ingrown nail excised. See procedure note. -Educated on post-procedure care including soaking. Written instructions provided and reviewed.   Procedure: Excision of Ingrown Toenail Location: Right 1st toe medial nail borders. Anesthesia: Lidocaine 1% plain; 1.5 mL and Marcaine 0.5% plain; 1.5 mL, digital block. Skin Prep: Betadine. Dressing: Silvadene; telfa; dry, sterile, compression dressing. Technique: Following skin prep, the toe was exsanguinated and a tourniquet was secured at the base of the toe. The affected nail border was freed, split with a nail splitter, and excised. The tourniquet was then removed and sterile dressing applied. Disposition: Patient tolerated procedure well.      Return if symptoms worsen or fail to improve.

## 2020-11-11 NOTE — Patient Instructions (Signed)

## 2020-11-23 DIAGNOSIS — Z0000 Encounter for general adult medical examination without abnormal findings: Secondary | ICD-10-CM | POA: Diagnosis not present

## 2020-11-23 DIAGNOSIS — Z136 Encounter for screening for cardiovascular disorders: Secondary | ICD-10-CM | POA: Diagnosis not present

## 2020-11-23 DIAGNOSIS — Z1389 Encounter for screening for other disorder: Secondary | ICD-10-CM | POA: Diagnosis not present

## 2020-11-23 DIAGNOSIS — G2581 Restless legs syndrome: Secondary | ICD-10-CM | POA: Diagnosis not present

## 2020-11-23 DIAGNOSIS — G20 Parkinson's disease: Secondary | ICD-10-CM | POA: Diagnosis not present

## 2020-11-23 DIAGNOSIS — Z5181 Encounter for therapeutic drug level monitoring: Secondary | ICD-10-CM | POA: Diagnosis not present

## 2020-12-01 NOTE — Progress Notes (Signed)
Assessment/Plan:   1.  Parkinsons Disease             -Continue carbidopa/levodopa 25/100, 2 tablets 3 times per day.             -no driving  -use walker at all times - discussed in detail   2.  PDD             -Continue donepezil, 10 mg daily   3.  RBD             -Continue clonazepam, 0.5 mg at bedtime.   4.  Neurogenic Orthostatic Hypotension             -Continue midodrine, 2.5 mg twice daily.   5.  Urinary frequency             -Follow-up urology.   Subjective:   [name]. was seen today in follow up for Parkinsons disease.  My previous records were reviewed prior to todays visit as well as outside records available to me. Pt has had falls (reported by PT) - didn't get hurt but PT put him in extra PT b/c of it. Daughter states that he had several falls, but relates many to lack of sleep.  The falls were without the walker.  No falls in last 6 weeks.   Pt denies lightheadedness, near syncope.  No hallucinations.   They emailed me since last visit asking about taking melatonin for sleep, which I agreed with.  Report today that he has been on that for a month and is doing better.  Current prescribed movement disorder medications: Carbidopa/levodopa 25/100, 2 tablets 3 times daily  Clonazepam 0.5 mg daily Donepezil 10 mg daily Midodrine, 2.5 mg twice per day Melatonin, 3 mg   ALLERGIES:   Allergies  Allergen Reactions   Penicillins     Unsure of reaction   Vicodin [Hydrocodone-Acetaminophen] Other (See Comments)    "Mental reaction"    CURRENT MEDICATIONS:  Outpatient Encounter Medications as of 12/02/2020  Medication Sig   Acetaminophen (TYLENOL ARTHRITIS PAIN PO) Take by mouth as needed.   acetaminophen (TYLENOL) 500 MG tablet Take 1 tablet by mouth daily.   aspirin 81 MG EC tablet Take 1 tablet by mouth daily.   aspirin EC 81 MG tablet Take 1 tablet (81 mg total) by mouth daily.   carbidopa-levodopa (SINEMET IR) 25-100 MG tablet TAKE 2 TABLETS BY  MOUTH THREE TIMES DAILY   cholecalciferol (VITAMIN D) 1000 units tablet Take 1 tablet (1,000 Units total) by mouth at bedtime.   clonazePAM (KLONOPIN) 0.5 MG tablet Take 1 tablet (0.5 mg total) by mouth at bedtime.   clonazePAM (KLONOPIN) 0.5 MG tablet Take 1 tablet by mouth at bedtime.   donepezil (ARICEPT) 10 MG tablet Take 1 tablet (10 mg total) by mouth at bedtime.   doxycycline (VIBRA-TABS) 100 MG tablet Take 100 mg by mouth 2 (two) times daily.   Melatonin-Pyridoxine (MELATIN PO) Take 300 mg by mouth.   midodrine (PROAMATINE) 2.5 MG tablet Take 1 tablet (2.5 mg total) by mouth 2 (two) times daily with a meal.   MYRBETRIQ 50 MG TB24 tablet Take 1 tablet (50 mg total) by mouth daily.   Cholecalciferol 50 MCG (2000 UT) TABS Take 1 tablet by mouth daily. (Patient not taking: Reported on 12/02/2020)   No facility-administered encounter medications on file as of 12/02/2020.    Objective:   PHYSICAL EXAMINATION:    VITALS:   Vitals:   12/02/20  1527  BP: 128/72  Pulse: 68  Resp: 18  SpO2: 98%  Weight: 130 lb (59 kg)  Height: 5\' 5"  (1.651 m)    GEN:  The patient appears stated age and is in NAD. HEENT:  Normocephalic, atraumatic.  The mucous membranes are moist. The superficial temporal arteries are without ropiness or tenderness.   Neurological examination:  Orientation: The patient is alert and oriented x3. Cranial nerves: There is good facial symmetry with facial hypomimia. The speech is fluent and hypophonic. Soft palate rises symmetrically and there is no tongue deviation. Hearing is intact to conversational tone. Sensation: Sensation is intact to light touch throughout Motor: Strength is at least antigravity x4.  Movement examination: Tone: There is nl tone in the ue/le Abnormal movements: none Coordination:  There is mild decremation with RAM's but he is more slow than decremation Gait and Station:   Patient pushes off of the chair to arise.  The patient's stride length  is decreased and he has festination with the walker.    I have reviewed and interpreted the following labs independently    Chemistry      Component Value Date/Time   NA 140 01/16/2019 1613   K 4.8 01/16/2019 1613   CL 104 01/16/2019 1613   CO2 28 01/16/2019 1613   BUN 20 01/16/2019 1613   CREATININE 1.11 01/16/2019 1613      Component Value Date/Time   CALCIUM 9.3 01/16/2019 1613   ALKPHOS 91 04/16/2018 0447   AST 13 01/16/2019 1613   ALT 3 (L) 01/16/2019 1613   BILITOT 0.5 01/16/2019 1613       Lab Results  Component Value Date   WBC 4.5 01/16/2019   HGB 12.7 (L) 01/16/2019   HCT 38.3 (L) 01/16/2019   MCV 88.9 01/16/2019   PLT 105 (L) 01/16/2019    Lab Results  Component Value Date   TSH 1.57 11/16/2017     Total time spent on today's visit was 20 minutes, including both face-to-face time and nonface-to-face time.  Time included that spent on review of records (prior notes available to me/labs/imaging if pertinent), discussing treatment and goals, answering patient's questions and coordinating care.  Cc:  Lavone Orn, MD

## 2020-12-02 ENCOUNTER — Encounter: Payer: Self-pay | Admitting: Neurology

## 2020-12-02 ENCOUNTER — Ambulatory Visit (INDEPENDENT_AMBULATORY_CARE_PROVIDER_SITE_OTHER): Payer: Medicare Other | Admitting: Neurology

## 2020-12-02 ENCOUNTER — Other Ambulatory Visit: Payer: Self-pay

## 2020-12-02 VITALS — BP 128/72 | HR 68 | Resp 18 | Ht 65.0 in | Wt 130.0 lb

## 2020-12-02 DIAGNOSIS — G20 Parkinson's disease: Secondary | ICD-10-CM | POA: Diagnosis not present

## 2020-12-02 DIAGNOSIS — G903 Multi-system degeneration of the autonomic nervous system: Secondary | ICD-10-CM | POA: Diagnosis not present

## 2020-12-02 DIAGNOSIS — F0280 Dementia in other diseases classified elsewhere without behavioral disturbance: Secondary | ICD-10-CM | POA: Diagnosis not present

## 2020-12-02 MED ORDER — DONEPEZIL HCL 10 MG PO TABS: 10.0000 mg | ORAL_TABLET | Freq: Every day | ORAL | 1 refills | Status: DC

## 2020-12-02 MED ORDER — MIDODRINE HCL 2.5 MG PO TABS: 2.5000 mg | ORAL_TABLET | Freq: Two times a day (BID) | ORAL | 1 refills | Status: DC

## 2020-12-02 NOTE — Patient Instructions (Signed)
Parkinson's Disease Parkinson's disease is a type of movement disorder. It is a long-term condition that gets worse over time (is progressive). Each person with Parkinson's disease is affected differently. This condition limits your ability to control movements and move your body normally. The condition can range from mild to severe. Parkinson's diseasetends to get worse slowly over several years. What are the causes? Parkinson's disease results from a loss of brain cells (neurons) that make a brain chemical called dopamine. Dopamine is needed to control movement. As the condition gets worse, neurons make less dopamine. This makesit hard to move or control your movements. The exact cause of the loss of neurons and why they make less dopamine is not known. Factors related to genes and the environment may contribute to the causeof Parkinson's disease. What increases the risk? The following factors may make you more likely to develop this condition: Being male. Being age 81 or older. Having a family history of Parkinson's disease. Having had a traumatic brain injury. Having experienced depression. Having been exposed to toxins, such as pesticides. What are the signs or symptoms? Symptoms of this condition can vary. The main symptoms are related to movement. These include: A tremor or shaking while you are resting that you cannot control. Stiffness in your neck, arms, and legs (rigidity). Slowing of movement. You may lose facial expressions and have trouble making small movements that are needed to button clothing or brush your teeth. An abnormal walk. You may walk with short, shuffling steps. Loss of balance and stability when standing. You may sway, fall backward, and have trouble making turns. Other symptoms include: Mental or cognitive changes including depression, anxiety, having false beliefs (delusions), or seeing, hearing, or feeling things that do not exist (hallucinations). Trouble  speaking or swallowing. Changes in bowel or bladder functions including constipation, having to go urgently or frequently, or not being able to control your bowel or bladder. Changes in sleep habits or trouble sleeping. Parkinson's disease may be graded by severity of your condition as mild, moderate, or advanced. Parkinson's disease progression is different for everyone. You may not progress to the advanced stage. Mild Parkinson's disease involves: Movement problems that do not affect daily activities. Movement problems on one side of the body. Moderate Parkinson's disease involves: Movement problems on both sides of the body. Slowing of movement. Coordination and balance problems. Advanced Parkinson's disease involves: Extreme difficulty walking. Inability to live alone safely. Signs of dementia, such as having trouble remembering things, doing daily tasks such as getting dressed, and problem solving. How is this diagnosed? This condition is diagnosed by a specialist. A diagnosis may be made based on symptoms, your medical history, and a physical exam. You may also have brainimaging tests to check for a loss of dopamine-producing areas of the brain. How is this treated? There is no cure for Parkinson's disease. Treatment focuses on managing your symptoms. Treatment may include: Medicines. Everyone responds to medicines differently. Your response may change over time. Work with your health care provider to find the best medicines for you. Speech, occupational, and physical therapy. Deep brain stimulation surgery to reduce tremors and other involuntary movements. Follow these instructions at home: Medicines Take over-the-counter and prescription medicines only as told by your health care provider. Avoid taking medicines that can affect thinking, such as pain or sleeping medicines. Eating and drinking Follow instructions from your health care provider about eating or drinking  restrictions. Do not drink alcohol. Activity Talk with your health care provider about  if it is safe for you to drive. Do exercises as told by your health care provider or physical therapist. Lifestyle     Install grab bars and railings in your home to prevent falls. Do not use any products that contain nicotine or tobacco, such as cigarettes, e-cigarettes, and chewing tobacco. If you need help quitting, ask your health care provider. Consider joining a support group for people with Parkinson's disease. General instructions Work with your health care provider to determine what you need help with and what your safety needs are. Keep all follow-up visits as told by your health care provider, including any visits with a physical therapist, speech therapist, or occupational therapist. This is important. Contact a health care provider if: Medicines do not help your symptoms. You are unsteady or have fallen at home. You need more support to function well at home. You have trouble swallowing. You have severe constipation. You are having problems with side effects from your medicines. You feel confused, anxious, or depressed. Get help right away if you: Are injured after a fall. See or hear things that are not real. Cannot swallow without choking. Have chest pain or trouble breathing. Do not feel safe at home. Have thoughts about hurting yourself or others. If you ever feel like you may hurt yourself or others, or have thoughts about taking your own life, get help right away. You can go to your nearest emergency department or call: Your local emergency services (911 in the [place] A suicide crisis helpline, such as the [facility] at 540-820-8082. This is open 24 hours a day. Summary Parkinson's disease is a long-term condition that gets worse over time. This condition limits your ability to control your movements and move your body normally. There is no cure for  Parkinson's disease. Treatment focuses on managing your symptoms. Work with your health care provider to determine what you need help with and what your safety needs are. Keep all follow-up visits as told by your health care provider, including any visits with a physical therapist, speech therapist, or occupational therapist. This is important. This information is not intended to replace advice given to you by your health care provider. Make sure you discuss any questions you have with your healthcare provider. Document Revised: 07/18/2018 Document Reviewed: 07/18/2018 Elsevier Patient Education  [place].

## 2020-12-03 ENCOUNTER — Other Ambulatory Visit: Payer: Self-pay

## 2020-12-03 MED ORDER — DONEPEZIL HCL 10 MG PO TABS: 10.0000 mg | ORAL_TABLET | Freq: Every day | ORAL | 1 refills | Status: DC

## 2020-12-03 MED ORDER — MIDODRINE HCL 2.5 MG PO TABS: 2.5000 mg | ORAL_TABLET | Freq: Two times a day (BID) | ORAL | 1 refills | Status: DC

## 2020-12-15 ENCOUNTER — Other Ambulatory Visit: Payer: Self-pay

## 2020-12-15 MED ORDER — CLONAZEPAM 0.5 MG PO TABS: 0.5000 mg | ORAL_TABLET | Freq: Every day | ORAL | 1 refills | Status: DC

## 2020-12-15 NOTE — Telephone Encounter (Signed)
I sent klonopin.  Make sure pharmacy got the donepezil

## 2020-12-22 DIAGNOSIS — M25552 Pain in left hip: Secondary | ICD-10-CM | POA: Diagnosis not present

## 2020-12-22 DIAGNOSIS — M25551 Pain in right hip: Secondary | ICD-10-CM | POA: Diagnosis not present

## 2020-12-22 DIAGNOSIS — M545 Low back pain, unspecified: Secondary | ICD-10-CM | POA: Diagnosis not present

## 2021-01-14 DIAGNOSIS — Z86018 Personal history of other benign neoplasm: Secondary | ICD-10-CM | POA: Diagnosis not present

## 2021-01-14 DIAGNOSIS — Z602 Problems related to living alone: Secondary | ICD-10-CM | POA: Diagnosis not present

## 2021-01-14 DIAGNOSIS — Z9181 History of falling: Secondary | ICD-10-CM | POA: Diagnosis not present

## 2021-01-14 DIAGNOSIS — Z7982 Long term (current) use of aspirin: Secondary | ICD-10-CM | POA: Diagnosis not present

## 2021-01-14 DIAGNOSIS — R296 Repeated falls: Secondary | ICD-10-CM | POA: Diagnosis not present

## 2021-01-14 DIAGNOSIS — M5136 Other intervertebral disc degeneration, lumbar region: Secondary | ICD-10-CM | POA: Diagnosis not present

## 2021-01-14 DIAGNOSIS — R32 Unspecified urinary incontinence: Secondary | ICD-10-CM | POA: Diagnosis not present

## 2021-01-14 DIAGNOSIS — G20 Parkinson's disease: Secondary | ICD-10-CM | POA: Diagnosis not present

## 2021-01-14 DIAGNOSIS — M4186 Other forms of scoliosis, lumbar region: Secondary | ICD-10-CM | POA: Diagnosis not present

## 2021-01-28 DIAGNOSIS — M4186 Other forms of scoliosis, lumbar region: Secondary | ICD-10-CM | POA: Diagnosis not present

## 2021-01-28 DIAGNOSIS — R296 Repeated falls: Secondary | ICD-10-CM | POA: Diagnosis not present

## 2021-01-28 DIAGNOSIS — G20 Parkinson's disease: Secondary | ICD-10-CM | POA: Diagnosis not present

## 2021-01-28 DIAGNOSIS — M5136 Other intervertebral disc degeneration, lumbar region: Secondary | ICD-10-CM | POA: Diagnosis not present

## 2021-01-28 DIAGNOSIS — Z7982 Long term (current) use of aspirin: Secondary | ICD-10-CM | POA: Diagnosis not present

## 2021-01-28 DIAGNOSIS — R32 Unspecified urinary incontinence: Secondary | ICD-10-CM | POA: Diagnosis not present

## 2021-02-01 DIAGNOSIS — M4186 Other forms of scoliosis, lumbar region: Secondary | ICD-10-CM | POA: Diagnosis not present

## 2021-02-01 DIAGNOSIS — R32 Unspecified urinary incontinence: Secondary | ICD-10-CM | POA: Diagnosis not present

## 2021-02-01 DIAGNOSIS — M5136 Other intervertebral disc degeneration, lumbar region: Secondary | ICD-10-CM | POA: Diagnosis not present

## 2021-02-01 DIAGNOSIS — R296 Repeated falls: Secondary | ICD-10-CM | POA: Diagnosis not present

## 2021-02-01 DIAGNOSIS — Z7982 Long term (current) use of aspirin: Secondary | ICD-10-CM | POA: Diagnosis not present

## 2021-02-01 DIAGNOSIS — G20 Parkinson's disease: Secondary | ICD-10-CM | POA: Diagnosis not present

## 2021-02-04 DIAGNOSIS — M5136 Other intervertebral disc degeneration, lumbar region: Secondary | ICD-10-CM | POA: Diagnosis not present

## 2021-02-04 DIAGNOSIS — R32 Unspecified urinary incontinence: Secondary | ICD-10-CM | POA: Diagnosis not present

## 2021-02-04 DIAGNOSIS — Z7982 Long term (current) use of aspirin: Secondary | ICD-10-CM | POA: Diagnosis not present

## 2021-02-04 DIAGNOSIS — R296 Repeated falls: Secondary | ICD-10-CM | POA: Diagnosis not present

## 2021-02-04 DIAGNOSIS — G20 Parkinson's disease: Secondary | ICD-10-CM | POA: Diagnosis not present

## 2021-02-04 DIAGNOSIS — M4186 Other forms of scoliosis, lumbar region: Secondary | ICD-10-CM | POA: Diagnosis not present

## 2021-02-07 DIAGNOSIS — Z7982 Long term (current) use of aspirin: Secondary | ICD-10-CM | POA: Diagnosis not present

## 2021-02-07 DIAGNOSIS — M5136 Other intervertebral disc degeneration, lumbar region: Secondary | ICD-10-CM | POA: Diagnosis not present

## 2021-02-07 DIAGNOSIS — R296 Repeated falls: Secondary | ICD-10-CM | POA: Diagnosis not present

## 2021-02-07 DIAGNOSIS — R32 Unspecified urinary incontinence: Secondary | ICD-10-CM | POA: Diagnosis not present

## 2021-02-07 DIAGNOSIS — G20 Parkinson's disease: Secondary | ICD-10-CM | POA: Diagnosis not present

## 2021-02-07 DIAGNOSIS — M4186 Other forms of scoliosis, lumbar region: Secondary | ICD-10-CM | POA: Diagnosis not present

## 2021-02-10 DIAGNOSIS — Z7982 Long term (current) use of aspirin: Secondary | ICD-10-CM | POA: Diagnosis not present

## 2021-02-10 DIAGNOSIS — M5136 Other intervertebral disc degeneration, lumbar region: Secondary | ICD-10-CM | POA: Diagnosis not present

## 2021-02-10 DIAGNOSIS — G20 Parkinson's disease: Secondary | ICD-10-CM | POA: Diagnosis not present

## 2021-02-10 DIAGNOSIS — R296 Repeated falls: Secondary | ICD-10-CM | POA: Diagnosis not present

## 2021-02-10 DIAGNOSIS — R32 Unspecified urinary incontinence: Secondary | ICD-10-CM | POA: Diagnosis not present

## 2021-02-10 DIAGNOSIS — M4186 Other forms of scoliosis, lumbar region: Secondary | ICD-10-CM | POA: Diagnosis not present

## 2021-02-13 DIAGNOSIS — Z9181 History of falling: Secondary | ICD-10-CM | POA: Diagnosis not present

## 2021-02-13 DIAGNOSIS — G20 Parkinson's disease: Secondary | ICD-10-CM | POA: Diagnosis not present

## 2021-02-13 DIAGNOSIS — R32 Unspecified urinary incontinence: Secondary | ICD-10-CM | POA: Diagnosis not present

## 2021-02-13 DIAGNOSIS — M4186 Other forms of scoliosis, lumbar region: Secondary | ICD-10-CM | POA: Diagnosis not present

## 2021-02-13 DIAGNOSIS — M5136 Other intervertebral disc degeneration, lumbar region: Secondary | ICD-10-CM | POA: Diagnosis not present

## 2021-02-13 DIAGNOSIS — Z86018 Personal history of other benign neoplasm: Secondary | ICD-10-CM | POA: Diagnosis not present

## 2021-02-13 DIAGNOSIS — Z7982 Long term (current) use of aspirin: Secondary | ICD-10-CM | POA: Diagnosis not present

## 2021-02-13 DIAGNOSIS — R296 Repeated falls: Secondary | ICD-10-CM | POA: Diagnosis not present

## 2021-02-13 DIAGNOSIS — Z602 Problems related to living alone: Secondary | ICD-10-CM | POA: Diagnosis not present

## 2021-02-15 DIAGNOSIS — R32 Unspecified urinary incontinence: Secondary | ICD-10-CM | POA: Diagnosis not present

## 2021-02-15 DIAGNOSIS — M4186 Other forms of scoliosis, lumbar region: Secondary | ICD-10-CM | POA: Diagnosis not present

## 2021-02-15 DIAGNOSIS — G20 Parkinson's disease: Secondary | ICD-10-CM | POA: Diagnosis not present

## 2021-02-15 DIAGNOSIS — Z7982 Long term (current) use of aspirin: Secondary | ICD-10-CM | POA: Diagnosis not present

## 2021-02-15 DIAGNOSIS — M5136 Other intervertebral disc degeneration, lumbar region: Secondary | ICD-10-CM | POA: Diagnosis not present

## 2021-02-15 DIAGNOSIS — R296 Repeated falls: Secondary | ICD-10-CM | POA: Diagnosis not present

## 2021-02-23 DIAGNOSIS — R296 Repeated falls: Secondary | ICD-10-CM | POA: Diagnosis not present

## 2021-02-23 DIAGNOSIS — M5136 Other intervertebral disc degeneration, lumbar region: Secondary | ICD-10-CM | POA: Diagnosis not present

## 2021-02-23 DIAGNOSIS — M4186 Other forms of scoliosis, lumbar region: Secondary | ICD-10-CM | POA: Diagnosis not present

## 2021-02-23 DIAGNOSIS — Z7982 Long term (current) use of aspirin: Secondary | ICD-10-CM | POA: Diagnosis not present

## 2021-02-23 DIAGNOSIS — G20 Parkinson's disease: Secondary | ICD-10-CM | POA: Diagnosis not present

## 2021-02-23 DIAGNOSIS — R32 Unspecified urinary incontinence: Secondary | ICD-10-CM | POA: Diagnosis not present

## 2021-03-02 DIAGNOSIS — Z7982 Long term (current) use of aspirin: Secondary | ICD-10-CM | POA: Diagnosis not present

## 2021-03-02 DIAGNOSIS — M5136 Other intervertebral disc degeneration, lumbar region: Secondary | ICD-10-CM | POA: Diagnosis not present

## 2021-03-02 DIAGNOSIS — R32 Unspecified urinary incontinence: Secondary | ICD-10-CM | POA: Diagnosis not present

## 2021-03-02 DIAGNOSIS — R296 Repeated falls: Secondary | ICD-10-CM | POA: Diagnosis not present

## 2021-03-02 DIAGNOSIS — M4186 Other forms of scoliosis, lumbar region: Secondary | ICD-10-CM | POA: Diagnosis not present

## 2021-03-02 DIAGNOSIS — G20 Parkinson's disease: Secondary | ICD-10-CM | POA: Diagnosis not present

## 2021-03-10 DIAGNOSIS — G20 Parkinson's disease: Secondary | ICD-10-CM | POA: Diagnosis not present

## 2021-03-10 DIAGNOSIS — M5136 Other intervertebral disc degeneration, lumbar region: Secondary | ICD-10-CM | POA: Diagnosis not present

## 2021-03-10 DIAGNOSIS — M4186 Other forms of scoliosis, lumbar region: Secondary | ICD-10-CM | POA: Diagnosis not present

## 2021-03-10 DIAGNOSIS — Z7982 Long term (current) use of aspirin: Secondary | ICD-10-CM | POA: Diagnosis not present

## 2021-03-10 DIAGNOSIS — R32 Unspecified urinary incontinence: Secondary | ICD-10-CM | POA: Diagnosis not present

## 2021-03-10 DIAGNOSIS — R296 Repeated falls: Secondary | ICD-10-CM | POA: Diagnosis not present

## 2021-03-18 MED ORDER — CLONAZEPAM 0.5 MG PO TABS: 0.5000 mg | ORAL_TABLET | Freq: Every day | ORAL | 1 refills | Status: DC

## 2021-03-26 DIAGNOSIS — Z23 Encounter for immunization: Secondary | ICD-10-CM | POA: Diagnosis not present

## 2021-05-02 ENCOUNTER — Other Ambulatory Visit: Payer: Self-pay | Admitting: Neurology

## 2021-05-02 DIAGNOSIS — G20 Parkinson's disease: Secondary | ICD-10-CM

## 2021-06-09 ENCOUNTER — Other Ambulatory Visit: Payer: Self-pay | Admitting: Neurology

## 2021-06-09 DIAGNOSIS — G20 Parkinson's disease: Secondary | ICD-10-CM

## 2021-06-10 ENCOUNTER — Other Ambulatory Visit: Payer: Self-pay

## 2021-06-13 NOTE — Progress Notes (Signed)
Assessment/Plan:   1.  Parkinsons Disease             -Continue carbidopa/levodopa 25/100, 2 tablets 3 times per day.  -Reiterated to the patient again today that he needs to use his walker at all times.     2.  PDD             -Continue donepezil, 10 mg daily   3.  RBD but with insomnia             -decrease clonazepam, 0.5, 1/2 tablet nightly  -cautiously add trazodone, 50 mg, half tablet at night.  -Told daughter to watch this combination closely.  They really think that the lack of sleep at night is what generates the daytime falls (he does not fall at night).  We may need to increase the dose of the trazodone, but he is pretty frail also want to do this slowly.   4.  Neurogenic Orthostatic Hypotension             -Continue midodrine, 2.5 mg twice daily.   5.  Urinary frequency             -Follow-up urology.   Subjective:   [name]. was seen today in follow up for Parkinsons disease.  My previous records were reviewed prior to todays visit as well as outside records available to me. Pt getting up at night b/c can't sleep and then more sleepy in the day and has more falls in the day.  Walker not with patient with the fall.   On klonopin at bedtime.  No dizziness.    Current prescribed movement disorder medications: Carbidopa/levodopa 25/100, 2 tablets 3 times daily  Clonazepam 0.5 mg daily Donepezil 10 mg daily Midodrine, 2.5 mg twice per day Melatonin, 3 mg   ALLERGIES:   Allergies  Allergen Reactions   Penicillins     Unsure of reaction   Vicodin [Hydrocodone-Acetaminophen] Other (See Comments)    "Mental reaction"    CURRENT MEDICATIONS:  Outpatient Encounter Medications as of 06/14/2021  Medication Sig   Acetaminophen (TYLENOL ARTHRITIS PAIN PO) Take by mouth as needed.   acetaminophen (TYLENOL) 500 MG tablet Take 1 tablet by mouth daily.   aspirin 81 MG EC tablet Take 1 tablet by mouth daily.   aspirin EC 81 MG tablet Take 1 tablet (81 mg  total) by mouth daily.   carbidopa-levodopa (SINEMET IR) 25-100 MG tablet TAKE 2 TABLETS BY MOUTH THREE TIMES DAILY   cholecalciferol (VITAMIN D) 1000 units tablet Take 1 tablet (1,000 Units total) by mouth at bedtime.   Cholecalciferol 50 MCG (2000 UT) TABS Take 1 tablet by mouth daily.   clonazePAM (KLONOPIN) 0.5 MG tablet Take 1 tablet (0.5 mg total) by mouth at bedtime.   donepezil (ARICEPT) 10 MG tablet Take 1 tablet (10 mg total) by mouth at bedtime.   doxycycline (VIBRA-TABS) 100 MG tablet Take 100 mg by mouth 2 (two) times daily.   Melatonin-Pyridoxine (MELATIN PO) Take 300 mg by mouth.   midodrine (PROAMATINE) 2.5 MG tablet TAKE 1 TABLET(2.5 MG) BY MOUTH TWICE DAILY WITH A MEAL   MYRBETRIQ 50 MG TB24 tablet Take 1 tablet (50 mg total) by mouth daily.   No facility-administered encounter medications on file as of 06/14/2021.    Objective:   PHYSICAL EXAMINATION:    VITALS:   Vitals:   06/14/21 1440  BP: 132/76  Pulse: 70  SpO2: 95%  Weight: 140 lb 6.4  oz (63.7 kg)  Height: 5\' 6"  (1.676 m)     GEN:  The patient appears stated age and is in NAD. HEENT:  Normocephalic, atraumatic.  The mucous membranes are moist. The superficial temporal arteries are without ropiness or tenderness.   Neurological examination:  Orientation: The patient is alert and oriented x3. Cranial nerves: There is good facial symmetry with facial hypomimia. The speech is fluent and hypophonic. Soft palate rises symmetrically and there is no tongue deviation. Hearing is intact to conversational tone. Sensation: Sensation is intact to light touch throughout Motor: Strength is at least antigravity x4.  Movement examination: Tone: There is nl tone in the ue/le Abnormal movements: none Coordination:  There is mild decremation with RAM's but he is more slow than decremation Gait and Station:   Patient pushes off of the chair to arise.  The patient's stride length is decreased and he has festination with  the walker.    I have reviewed and interpreted the following labs independently    Chemistry      Component Value Date/Time   NA 140 01/16/2019 1613   K 4.8 01/16/2019 1613   CL 104 01/16/2019 1613   CO2 28 01/16/2019 1613   BUN 20 01/16/2019 1613   CREATININE 1.11 01/16/2019 1613      Component Value Date/Time   CALCIUM 9.3 01/16/2019 1613   ALKPHOS 91 04/16/2018 0447   AST 13 01/16/2019 1613   ALT 3 (L) 01/16/2019 1613   BILITOT 0.5 01/16/2019 1613       Lab Results  Component Value Date   WBC 4.5 01/16/2019   HGB 12.7 (L) 01/16/2019   HCT 38.3 (L) 01/16/2019   MCV 88.9 01/16/2019   PLT 105 (L) 01/16/2019    Lab Results  Component Value Date   TSH 1.57 11/16/2017     Total time spent on today's visit was 30 minutes, including both face-to-face time and nonface-to-face time.  Time included that spent on review of records (prior notes available to me/labs/imaging if pertinent), discussing treatment and goals, answering patient's questions and coordinating care.  Cc:  Lavone Orn, MD

## 2021-06-14 ENCOUNTER — Encounter: Payer: Self-pay | Admitting: Neurology

## 2021-06-14 ENCOUNTER — Ambulatory Visit (INDEPENDENT_AMBULATORY_CARE_PROVIDER_SITE_OTHER): Payer: Medicare Other | Admitting: Neurology

## 2021-06-14 ENCOUNTER — Other Ambulatory Visit: Payer: Self-pay

## 2021-06-14 VITALS — BP 132/76 | HR 70 | Ht 66.0 in | Wt 140.4 lb

## 2021-06-14 DIAGNOSIS — G4701 Insomnia due to medical condition: Secondary | ICD-10-CM

## 2021-06-14 DIAGNOSIS — G20 Parkinson's disease: Secondary | ICD-10-CM | POA: Diagnosis not present

## 2021-06-14 DIAGNOSIS — R296 Repeated falls: Secondary | ICD-10-CM

## 2021-06-14 MED ORDER — DONEPEZIL HCL 10 MG PO TABS: 10.0000 mg | ORAL_TABLET | Freq: Every day | ORAL | 2 refills | Status: AC

## 2021-06-14 MED ORDER — CARBIDOPA-LEVODOPA 25-100 MG PO TABS: 2.0000 | ORAL_TABLET | Freq: Three times a day (TID) | ORAL | 1 refills | Status: AC

## 2021-06-14 MED ORDER — TRAZODONE HCL 50 MG PO TABS: 25.0000 mg | ORAL_TABLET | Freq: Every day | ORAL | 1 refills | Status: DC

## 2021-06-14 MED ORDER — CLONAZEPAM 0.5 MG PO TABS: 0.2500 mg | ORAL_TABLET | Freq: Every day | ORAL | 1 refills | Status: AC

## 2021-06-14 MED ORDER — MIDODRINE HCL 2.5 MG PO TABS: ORAL_TABLET | ORAL | 1 refills | Status: AC

## 2021-06-14 NOTE — Patient Instructions (Signed)
Decrease klonopin to 0.5 mg 1/2 tablet at bed Start trazodone 50 mg, 1/2 tablet at bed

## 2021-06-27 ENCOUNTER — Ambulatory Visit (INDEPENDENT_AMBULATORY_CARE_PROVIDER_SITE_OTHER): Payer: Medicare Other | Admitting: Cardiology

## 2021-06-27 ENCOUNTER — Other Ambulatory Visit: Payer: Self-pay

## 2021-06-27 ENCOUNTER — Encounter: Payer: Self-pay | Admitting: Cardiology

## 2021-06-27 DIAGNOSIS — G903 Multi-system degeneration of the autonomic nervous system: Secondary | ICD-10-CM

## 2021-06-27 DIAGNOSIS — I959 Hypotension, unspecified: Secondary | ICD-10-CM | POA: Insufficient documentation

## 2021-06-27 DIAGNOSIS — G20 Parkinson's disease: Secondary | ICD-10-CM

## 2021-06-27 DIAGNOSIS — Z952 Presence of prosthetic heart valve: Secondary | ICD-10-CM | POA: Diagnosis not present

## 2021-06-27 NOTE — Progress Notes (Signed)
Cardiology Office Note:    Date:  06/27/2021   ID:  Bryce Garbe., DOB 03-Jul-1940, MRN [id number]  PCP:  Lavone Orn, MD   [facility] Providers Cardiologist:  Candee Furbish, MD     Referring MD: Lavone Orn, MD    History of Present Illness:    Bryce Colello. is a 81 y.o. male here for follow-up of aortic valve replacement number 26 mm Edwards 2015, TAVR.  Moderate perivalvular leak.  Parkinson's notes from Dr. Carles Collet have been reviewed.  Cardiac catheterization previously showed essentially normal arteries.  Blood pressure has been challenging, low at times. His daughter showed be several readings.  She was concerned about recent cough, chest fullness. Now improved.     Past Medical History:  Diagnosis Date   Anxiety    Aortic stenosis    Arthritis    left hip   Cancer ([place])    basal cell- facial?   Depression    Diabetes mellitus without complication (HCC)    GERD (gastroesophageal reflux disease)    Headache(784.0)    Heart murmur    Hernia, inguinal, bilateral, recurrent    Hypertension    Kidney stones    Parkinson disease ([place])    Parkinson's disease (tremor, stiffness, slow motion, unstable posture) ([place]) 12/11/2006   PONV (postoperative nausea and vomiting)    Restless leg syndrome    S/P TAVR (transcatheter aortic valve replacement) 11/18/2013   26 mm Edwards Sapien XT transcatheter heart valve placed via open right transfemoral approach   TIA (transient ischemic attack) 02/08/2007    Past Surgical History:  Procedure Laterality Date   CARDIAC CATHETERIZATION  10/09/13   cataract  2008   bilateral   COLONOSCOPY     CYSTOSCOPY/RETROGRADE/URETEROSCOPY  05/29/2011   Procedure: CYSTOSCOPY/RETROGRADE/URETEROSCOPY;  Surgeon: Malka So, MD;  Location: [facility];  Service: Urology;  Laterality: Left;  no stent    EYE SURGERY Bilateral    /w IOL   INGUINAL HERNIA REPAIR Bilateral 03/30/2014   Procedure: OPEN BILATERAL INGUINAL HERNIA REPAIR  WITH MESH;  Surgeon: Coralie Keens, MD;  Location: [place];  Service: General;  Laterality: Bilateral;   INSERTION OF MESH Bilateral 03/30/2014   Procedure: INSERTION OF MESH;  Surgeon: Coralie Keens, MD;  Location: [place];  Service: General;  Laterality: Bilateral;   INTRAOPERATIVE TRANSESOPHAGEAL ECHOCARDIOGRAM N/A 11/18/2013   Procedure: INTRAOPERATIVE TRANSESOPHAGEAL ECHOCARDIOGRAM;  Surgeon: Sherren Mocha, MD;  Location: [place];  Service: Open Heart Surgery;  Laterality: N/A;   LEFT AND RIGHT HEART CATHETERIZATION WITH CORONARY ANGIOGRAM N/A 10/09/2013   Procedure: LEFT AND RIGHT HEART CATHETERIZATION WITH CORONARY ANGIOGRAM;  Surgeon: Blane Ohara, MD;  Location: [facility] C[facility];  Service: Cardiovascular;  Laterality: N/A;   TRANSCATHETER AORTIC VALVE REPLACEMENT, TRANSFEMORAL N/A 11/18/2013   Procedure: TRANSCATHETER AORTIC VALVE REPLACEMENT, TRANSFEMORAL;  Surgeon: Sherren Mocha, MD;  Location: [facility];  Service: Open Heart Surgery;  Laterality: N/A;    Current Medications: Current Meds  Medication Sig   Acetaminophen (TYLENOL ARTHRITIS PAIN PO) Take by mouth as needed.   acetaminophen (TYLENOL) 500 MG tablet Take 1 tablet by mouth daily.   aspirin EC 81 MG tablet Take 1 tablet (81 mg total) by mouth daily.   carbidopa-levodopa (SINEMET IR) 25-100 MG tablet Take 2 tablets by mouth 3 (three) times daily.   cholecalciferol (VITAMIN D) 1000 units tablet Take 1 tablet (1,000 Units total) by mouth at bedtime.   Cholecalciferol 50 MCG (2000 UT) TABS Take 1 tablet  by mouth daily.   clonazePAM (KLONOPIN) 0.5 MG tablet Take 0.5 tablets (0.25 mg total) by mouth at bedtime. (Patient taking differently: Take 0.5 mg by mouth at bedtime.)   donepezil (ARICEPT) 10 MG tablet Take 1 tablet (10 mg total) by mouth at bedtime.   doxycycline (VIBRA-TABS) 100 MG tablet Take 100 mg by mouth 2 (two) times daily.   Melatonin-Pyridoxine (MELATIN PO) Take 300 mg by mouth.   midodrine (PROAMATINE) 2.5 MG tablet  TAKE 1 TABLET(2.5 MG) BY MOUTH TWICE DAILY WITH A MEAL   MYRBETRIQ 50 MG TB24 tablet Take 1 tablet (50 mg total) by mouth daily.     Allergies:   Penicillins and Vicodin [hydrocodone-acetaminophen]   Social History   Socioeconomic History   Marital status: Widowed    Spouse name: Not on file   Number of children: Not on file   Years of education: Not on file   Highest education level: Not on file  Occupational History   Not on file  Tobacco Use   Smoking status: Never   Smokeless tobacco: Never  Vaping Use   Vaping Use: Never used  Substance and Sexual Activity   Alcohol use: No   Drug use: No   Sexual activity: Not on file  Other Topics Concern   Not on file  Social History Narrative   Right handed      Drinks caffeine   One story home   Social Determinants of Health   Financial Resource Strain: Not on file  Food Insecurity: Not on file  Transportation Needs: Not on file  Physical Activity: Not on file  Stress: Not on file  Social Connections: Not on file     Family History: The patient's family history includes Anemia in his daughter; Arthritis/Rheumatoid in his mother; Asthma in his daughter; Breast cancer in his sister; Cancer in his son; Diabetes in his daughter; Hypertension in his daughter; Kidney disease in his daughter; Prostate cancer in his father. There is no history of Heart attack or Stroke.  ROS:   Please see the history of present illness.     All other systems reviewed and are negative.  EKGs/Labs/Other Studies Reviewed:    The following studies were reviewed today: Echocardiogram 01/14/2020:   1. Left ventricular ejection fraction, by estimation, is 60 to 65%. The  left ventricle has normal function. The left ventricle has no regional  wall motion abnormalities. Left ventricular diastolic parameters are  consistent with Grade I diastolic  dysfunction (impaired relaxation).   2. Right ventricular systolic function is normal. The right  ventricular  size is normal. There is normal pulmonary artery systolic pressure.   3. Left atrial size was moderately dilated.   4. Mobile / thinned atrial septum no obvious PFO.   5. Right atrial size was moderately dilated.   6. The mitral valve is degenerative. Trivial mitral valve regurgitation.  No evidence of mitral stenosis.   7. Post TAVR 11/18/2013. with 26 mm Sapien 3 valve. Gradients increased  from echo done on 04/09/18. mean 12->16 mmHg peak 22-> 31 mmHg mild  central and PVL noted as before . The aortic valve has been  repaired/replaced. Aortic valve regurgitation is not  visualized. No aortic stenosis is present.   8. The inferior vena cava is normal in size with greater than 50%  respiratory variability, suggesting right atrial pressure of 3 mmHg.   Carotid Dopplers 2019: Mild bilateral  EKG:  EKG is  ordered today.  The ekg ordered today  demonstrates SR 72 PRWP  Recent Labs: No results found for requested labs within last 8760 hours.  Recent Lipid Panel    Component Value Date/Time   CHOL 104 04/09/2018 0659   TRIG 23 04/09/2018 0659   HDL 40 (L) 04/09/2018 0659   CHOLHDL 2.6 04/09/2018 0659   VLDL 5 04/09/2018 0659   LDLCALC 59 04/09/2018 0659     Risk Assessment/Calculations:              Physical Exam:    VS:  BP 120/60 (BP Location: Left Arm, Patient Position: Sitting, Cuff Size: Normal)    Pulse 72    Ht 5\' 6"  (1.676 m)    Wt 140 lb (63.5 kg)    SpO2 94%    BMI 22.60 kg/m     Wt Readings from Last 3 Encounters:  06/27/21 140 lb (63.5 kg)  06/14/21 140 lb 6.4 oz (63.7 kg)  12/02/20 130 lb (59 kg)     GEN:  Disheveled. Sitting in rollator. Falling asleep.  HEENT: Normal NECK: No JVD; No carotid bruits LYMPHATICS: No lymphadenopathy CARDIAC: RRR, Both systolic and diastolic murmur 3/6, no rubs, gallops RESPIRATORY:  Clear to auscultation without rales, wheezing or rhonchi  ABDOMEN: Soft, non-tender, non-distended MUSCULOSKELETAL:  No edema;  No deformity  SKIN: Warm and dry NEUROLOGIC:  Sleepy. Challenging to speak PSYCHIATRIC:  Sleepy  ASSESSMENT:    1. S/P TAVR (transcatheter aortic valve replacement)   2. Parkinson's disease ([place])   3. Neurogenic orthostatic hypotension (HCC)    PLAN:    In order of problems listed above:  S/P TAVR (transcatheter aortic valve replacement) Echocardiogram reviewed as above.  We will continue to monitor.  Murmur heard on exam.  Parkinson's disease ([place]) Dr. Doristine Devoid notes reviewed.  Neurology.  Continue current treatment strategy.  Has had trouble in the past with hypotension.  Hydration, salt liberalization.  Common autonomic dysfunction.  Hypotension Midodrine.  Salt, fluids.  Associated with Parkinson's.         Medication Adjustments/Labs and Tests Ordered: Current medicines are reviewed at length with the patient today.  Concerns regarding medicines are outlined above.  Orders Placed This Encounter  Procedures   EKG 12-Lead   No orders of the defined types were placed in this encounter.   Patient Instructions  Medication Instructions:  The current medical regimen is effective;  continue present plan and medications.  *If you need a refill on your cardiac medications before your next appointment, please call your pharmacy*  Follow-Up: At [facility], you and your health needs are our priority.  As part of our continuing mission to provide you with exceptional heart care, we have created designated Provider Care Teams.  These Care Teams include your primary Cardiologist (physician) and Advanced Practice Providers (APPs -  Physician Assistants and Nurse Practitioners) who all work together to provide you with the care you need, when you need it.  We recommend signing up for the patient portal called "MyChart".  Sign up information is provided on this After Visit Summary.  MyChart is used to connect with patients for Virtual Visits (Telemedicine).  Patients are able to  view lab/test results, encounter notes, upcoming appointments, etc.  Non-urgent messages can be sent to your provider as well.   To learn more about what you can do with MyChart, go to [email or web address].    Your next appointment:   1 year(s)  The format for your next appointment:   In Person  Provider:   Elta Guadeloupe  Marlou Porch, MD     Thank you for choosing [facility]!!      Signed, Candee Furbish, MD  06/27/2021 10:44 AM    [place]

## 2021-06-27 NOTE — Patient Instructions (Signed)
Medication Instructions:  The current medical regimen is effective;  continue present plan and medications.  *If you need a refill on your cardiac medications before your next appointment, please call your pharmacy*  Follow-Up: At [facility], you and your health needs are our priority.  As part of our continuing mission to provide you with exceptional heart care, we have created designated Provider Care Teams.  These Care Teams include your primary Cardiologist (physician) and Advanced Practice Providers (APPs -  Physician Assistants and Nurse Practitioners) who all work together to provide you with the care you need, when you need it.  We recommend signing up for the patient portal called "MyChart".  Sign up information is provided on this After Visit Summary.  MyChart is used to connect with patients for Virtual Visits (Telemedicine).  Patients are able to view lab/test results, encounter notes, upcoming appointments, etc.  Non-urgent messages can be sent to your provider as well.   To learn more about what you can do with MyChart, go to [email or web address].    Your next appointment:   1 year(s)  The format for your next appointment:   In Person  Provider:   Mark Skains, MD   Thank you for choosing [facility]!!    

## 2021-06-27 NOTE — Assessment & Plan Note (Signed)
Echocardiogram reviewed as above.  We will continue to monitor.  Murmur heard on exam.

## 2021-06-27 NOTE — Assessment & Plan Note (Signed)
Midodrine.  Salt, fluids.  Associated with Parkinson's.

## 2021-06-27 NOTE — Assessment & Plan Note (Signed)
Dr. Doristine Devoid notes reviewed.  Neurology.  Continue current treatment strategy.  Has had trouble in the past with hypotension.  Hydration, salt liberalization.  Common autonomic dysfunction.

## 2021-07-04 ENCOUNTER — Telehealth: Payer: Self-pay | Admitting: Neurology

## 2021-07-04 NOTE — Telephone Encounter (Signed)
Called Daughter back and we spoke about getting palliative care set up for her dad. Patients daughter wanted to know if these are symptoms of decline of a  Parkinson's Patient ? Patient not hungry or thirsty. Spoke to [place] about talking to daughter about Palliative care and she will be calling her .

## 2021-07-04 NOTE — Telephone Encounter (Signed)
Spoke to patients daughter she is reaching out to PCP for UTI and a virtual appt . Daughter said its very hard to get patient up. They do have him sitting up know and are trying to get patient to take his medication but patient struggling to eat or drink. Patients daughter wondering if there are medications that Dr. Carles Collet prescribes that you feel are the most important to take and some that are least important.  Carbidopa levodopa 2 tab 3x a day  Clonazepam .05 at bedtime they are trying to make sure he is taking this to keep him comfortable Donepezil 1 tablet at bedtime Midodrine 1 tablet 2 x a day  Daughter said it has been getting worse but over the past few weeks he is getting even worse not wanting to eat or drink or get up. His cardiologist said his heart sounds good and blood pressure is staying normal

## 2021-07-04 NOTE — Telephone Encounter (Signed)
Patient daughter [name] stated her dad is declining fast. he is not mobile much. Isn't eating or drinking much. She is having a difficult time with him. She wants to talk about palliative care. She is not sure what the next step needs to be started. Vitals are good. Heart is good.  She has a lot more to talk about.

## 2021-07-05 ENCOUNTER — Other Ambulatory Visit: Payer: Self-pay

## 2021-07-05 DIAGNOSIS — G20 Parkinson's disease: Secondary | ICD-10-CM

## 2021-07-05 DIAGNOSIS — R4689 Other symptoms and signs involving appearance and behavior: Secondary | ICD-10-CM | POA: Diagnosis not present

## 2021-07-05 DIAGNOSIS — F0280 Dementia in other diseases classified elsewhere without behavioral disturbance: Secondary | ICD-10-CM

## 2021-07-05 DIAGNOSIS — R296 Repeated falls: Secondary | ICD-10-CM

## 2021-07-05 DIAGNOSIS — R051 Acute cough: Secondary | ICD-10-CM | POA: Diagnosis not present

## 2021-07-05 NOTE — Telephone Encounter (Signed)
Spoke to [facility] and they are going to stop the Donezipil. Patient has a virtual appt set up for PCP and urine culture and Hospice referral has been put in

## 2021-07-07 ENCOUNTER — Telehealth: Payer: Self-pay | Admitting: Neurology

## 2021-07-07 NOTE — Telephone Encounter (Signed)
Called patients daughter [name] and left message that she had not heard from them yet. I did receive a notification from [facility] they were gathering information today and should be calling her directly . I sent Delana Meyer a message to make sure she had the correct contact information for [facility]

## 2021-07-07 NOTE — Telephone Encounter (Signed)
Patient's daughter Neoma Laming called and said she has not heard from [facility] about the patient yet.  She said she was told to call if this happened and talk to Horace.

## 2021-07-10 DIAGNOSIS — G20 Parkinson's disease: Secondary | ICD-10-CM | POA: Diagnosis not present

## 2021-07-10 DIAGNOSIS — Z8673 Personal history of transient ischemic attack (TIA), and cerebral infarction without residual deficits: Secondary | ICD-10-CM | POA: Diagnosis not present

## 2021-07-10 DIAGNOSIS — M1610 Unilateral primary osteoarthritis, unspecified hip: Secondary | ICD-10-CM | POA: Diagnosis not present

## 2021-07-10 DIAGNOSIS — I350 Nonrheumatic aortic (valve) stenosis: Secondary | ICD-10-CM | POA: Diagnosis not present

## 2021-07-10 DIAGNOSIS — I10 Essential (primary) hypertension: Secondary | ICD-10-CM | POA: Diagnosis not present

## 2021-07-10 DIAGNOSIS — F0283 Dementia in other diseases classified elsewhere, unspecified severity, with mood disturbance: Secondary | ICD-10-CM | POA: Diagnosis not present

## 2021-07-10 DIAGNOSIS — F0284 Dementia in other diseases classified elsewhere, unspecified severity, with anxiety: Secondary | ICD-10-CM | POA: Diagnosis not present

## 2021-07-10 DIAGNOSIS — R630 Anorexia: Secondary | ICD-10-CM | POA: Diagnosis not present

## 2021-07-10 DIAGNOSIS — R1310 Dysphagia, unspecified: Secondary | ICD-10-CM | POA: Diagnosis not present

## 2021-07-10 DIAGNOSIS — N3281 Overactive bladder: Secondary | ICD-10-CM | POA: Diagnosis not present

## 2021-07-10 DIAGNOSIS — R519 Headache, unspecified: Secondary | ICD-10-CM | POA: Diagnosis not present

## 2021-07-12 DIAGNOSIS — F0283 Dementia in other diseases classified elsewhere, unspecified severity, with mood disturbance: Secondary | ICD-10-CM | POA: Diagnosis not present

## 2021-07-12 DIAGNOSIS — F0284 Dementia in other diseases classified elsewhere, unspecified severity, with anxiety: Secondary | ICD-10-CM | POA: Diagnosis not present

## 2021-07-12 DIAGNOSIS — G20 Parkinson's disease: Secondary | ICD-10-CM | POA: Diagnosis not present

## 2021-07-12 DIAGNOSIS — Z8673 Personal history of transient ischemic attack (TIA), and cerebral infarction without residual deficits: Secondary | ICD-10-CM | POA: Diagnosis not present

## 2021-07-12 DIAGNOSIS — R630 Anorexia: Secondary | ICD-10-CM | POA: Diagnosis not present

## 2021-07-12 DIAGNOSIS — R1310 Dysphagia, unspecified: Secondary | ICD-10-CM | POA: Diagnosis not present

## 2021-07-13 DIAGNOSIS — G20 Parkinson's disease: Secondary | ICD-10-CM | POA: Diagnosis not present

## 2021-07-13 DIAGNOSIS — Z8673 Personal history of transient ischemic attack (TIA), and cerebral infarction without residual deficits: Secondary | ICD-10-CM | POA: Diagnosis not present

## 2021-07-13 DIAGNOSIS — M1610 Unilateral primary osteoarthritis, unspecified hip: Secondary | ICD-10-CM | POA: Diagnosis not present

## 2021-07-13 DIAGNOSIS — R519 Headache, unspecified: Secondary | ICD-10-CM | POA: Diagnosis not present

## 2021-07-13 DIAGNOSIS — R1310 Dysphagia, unspecified: Secondary | ICD-10-CM | POA: Diagnosis not present

## 2021-07-13 DIAGNOSIS — F0283 Dementia in other diseases classified elsewhere, unspecified severity, with mood disturbance: Secondary | ICD-10-CM | POA: Diagnosis not present

## 2021-07-13 DIAGNOSIS — I10 Essential (primary) hypertension: Secondary | ICD-10-CM | POA: Diagnosis not present

## 2021-07-13 DIAGNOSIS — F0284 Dementia in other diseases classified elsewhere, unspecified severity, with anxiety: Secondary | ICD-10-CM | POA: Diagnosis not present

## 2021-07-13 DIAGNOSIS — I350 Nonrheumatic aortic (valve) stenosis: Secondary | ICD-10-CM | POA: Diagnosis not present

## 2021-07-13 DIAGNOSIS — R630 Anorexia: Secondary | ICD-10-CM | POA: Diagnosis not present

## 2021-07-13 DIAGNOSIS — N3281 Overactive bladder: Secondary | ICD-10-CM | POA: Diagnosis not present

## 2021-07-14 DIAGNOSIS — R1310 Dysphagia, unspecified: Secondary | ICD-10-CM | POA: Diagnosis not present

## 2021-07-14 DIAGNOSIS — F0284 Dementia in other diseases classified elsewhere, unspecified severity, with anxiety: Secondary | ICD-10-CM | POA: Diagnosis not present

## 2021-07-14 DIAGNOSIS — R630 Anorexia: Secondary | ICD-10-CM | POA: Diagnosis not present

## 2021-07-14 DIAGNOSIS — F0283 Dementia in other diseases classified elsewhere, unspecified severity, with mood disturbance: Secondary | ICD-10-CM | POA: Diagnosis not present

## 2021-07-14 DIAGNOSIS — Z8673 Personal history of transient ischemic attack (TIA), and cerebral infarction without residual deficits: Secondary | ICD-10-CM | POA: Diagnosis not present

## 2021-07-14 DIAGNOSIS — G20 Parkinson's disease: Secondary | ICD-10-CM | POA: Diagnosis not present

## 2021-07-15 DIAGNOSIS — F0284 Dementia in other diseases classified elsewhere, unspecified severity, with anxiety: Secondary | ICD-10-CM | POA: Diagnosis not present

## 2021-07-15 DIAGNOSIS — F0283 Dementia in other diseases classified elsewhere, unspecified severity, with mood disturbance: Secondary | ICD-10-CM | POA: Diagnosis not present

## 2021-07-15 DIAGNOSIS — R1310 Dysphagia, unspecified: Secondary | ICD-10-CM | POA: Diagnosis not present

## 2021-07-15 DIAGNOSIS — Z8673 Personal history of transient ischemic attack (TIA), and cerebral infarction without residual deficits: Secondary | ICD-10-CM | POA: Diagnosis not present

## 2021-07-15 DIAGNOSIS — G20 Parkinson's disease: Secondary | ICD-10-CM | POA: Diagnosis not present

## 2021-07-15 DIAGNOSIS — R630 Anorexia: Secondary | ICD-10-CM | POA: Diagnosis not present

## 2021-07-16 DIAGNOSIS — F0284 Dementia in other diseases classified elsewhere, unspecified severity, with anxiety: Secondary | ICD-10-CM | POA: Diagnosis not present

## 2021-07-16 DIAGNOSIS — R1310 Dysphagia, unspecified: Secondary | ICD-10-CM | POA: Diagnosis not present

## 2021-07-16 DIAGNOSIS — R630 Anorexia: Secondary | ICD-10-CM | POA: Diagnosis not present

## 2021-07-16 DIAGNOSIS — Z8673 Personal history of transient ischemic attack (TIA), and cerebral infarction without residual deficits: Secondary | ICD-10-CM | POA: Diagnosis not present

## 2021-07-16 DIAGNOSIS — F0283 Dementia in other diseases classified elsewhere, unspecified severity, with mood disturbance: Secondary | ICD-10-CM | POA: Diagnosis not present

## 2021-07-16 DIAGNOSIS — G20 Parkinson's disease: Secondary | ICD-10-CM | POA: Diagnosis not present

## 2021-07-17 DIAGNOSIS — R1310 Dysphagia, unspecified: Secondary | ICD-10-CM | POA: Diagnosis not present

## 2021-07-17 DIAGNOSIS — Z8673 Personal history of transient ischemic attack (TIA), and cerebral infarction without residual deficits: Secondary | ICD-10-CM | POA: Diagnosis not present

## 2021-07-17 DIAGNOSIS — R630 Anorexia: Secondary | ICD-10-CM | POA: Diagnosis not present

## 2021-07-17 DIAGNOSIS — F0284 Dementia in other diseases classified elsewhere, unspecified severity, with anxiety: Secondary | ICD-10-CM | POA: Diagnosis not present

## 2021-07-17 DIAGNOSIS — F0283 Dementia in other diseases classified elsewhere, unspecified severity, with mood disturbance: Secondary | ICD-10-CM | POA: Diagnosis not present

## 2021-07-17 DIAGNOSIS — G20 Parkinson's disease: Secondary | ICD-10-CM | POA: Diagnosis not present

## 2021-07-18 DIAGNOSIS — G20 Parkinson's disease: Secondary | ICD-10-CM | POA: Diagnosis not present

## 2021-07-18 DIAGNOSIS — R630 Anorexia: Secondary | ICD-10-CM | POA: Diagnosis not present

## 2021-07-18 DIAGNOSIS — F0284 Dementia in other diseases classified elsewhere, unspecified severity, with anxiety: Secondary | ICD-10-CM | POA: Diagnosis not present

## 2021-07-18 DIAGNOSIS — F0283 Dementia in other diseases classified elsewhere, unspecified severity, with mood disturbance: Secondary | ICD-10-CM | POA: Diagnosis not present

## 2021-07-18 DIAGNOSIS — Z8673 Personal history of transient ischemic attack (TIA), and cerebral infarction without residual deficits: Secondary | ICD-10-CM | POA: Diagnosis not present

## 2021-07-18 DIAGNOSIS — R1310 Dysphagia, unspecified: Secondary | ICD-10-CM | POA: Diagnosis not present

## 2021-07-19 DIAGNOSIS — Z8673 Personal history of transient ischemic attack (TIA), and cerebral infarction without residual deficits: Secondary | ICD-10-CM | POA: Diagnosis not present

## 2021-07-19 DIAGNOSIS — F0284 Dementia in other diseases classified elsewhere, unspecified severity, with anxiety: Secondary | ICD-10-CM | POA: Diagnosis not present

## 2021-07-19 DIAGNOSIS — R1310 Dysphagia, unspecified: Secondary | ICD-10-CM | POA: Diagnosis not present

## 2021-07-19 DIAGNOSIS — R630 Anorexia: Secondary | ICD-10-CM | POA: Diagnosis not present

## 2021-07-19 DIAGNOSIS — F0283 Dementia in other diseases classified elsewhere, unspecified severity, with mood disturbance: Secondary | ICD-10-CM | POA: Diagnosis not present

## 2021-07-19 DIAGNOSIS — G20 Parkinson's disease: Secondary | ICD-10-CM | POA: Diagnosis not present

## 2021-07-20 DIAGNOSIS — Z8673 Personal history of transient ischemic attack (TIA), and cerebral infarction without residual deficits: Secondary | ICD-10-CM | POA: Diagnosis not present

## 2021-07-20 DIAGNOSIS — F0284 Dementia in other diseases classified elsewhere, unspecified severity, with anxiety: Secondary | ICD-10-CM | POA: Diagnosis not present

## 2021-07-20 DIAGNOSIS — F0283 Dementia in other diseases classified elsewhere, unspecified severity, with mood disturbance: Secondary | ICD-10-CM | POA: Diagnosis not present

## 2021-07-20 DIAGNOSIS — R630 Anorexia: Secondary | ICD-10-CM | POA: Diagnosis not present

## 2021-07-20 DIAGNOSIS — R1310 Dysphagia, unspecified: Secondary | ICD-10-CM | POA: Diagnosis not present

## 2021-07-20 DIAGNOSIS — G20 Parkinson's disease: Secondary | ICD-10-CM | POA: Diagnosis not present

## 2021-07-21 DIAGNOSIS — Z8673 Personal history of transient ischemic attack (TIA), and cerebral infarction without residual deficits: Secondary | ICD-10-CM | POA: Diagnosis not present

## 2021-07-21 DIAGNOSIS — R1310 Dysphagia, unspecified: Secondary | ICD-10-CM | POA: Diagnosis not present

## 2021-07-21 DIAGNOSIS — F0283 Dementia in other diseases classified elsewhere, unspecified severity, with mood disturbance: Secondary | ICD-10-CM | POA: Diagnosis not present

## 2021-07-21 DIAGNOSIS — G20 Parkinson's disease: Secondary | ICD-10-CM | POA: Diagnosis not present

## 2021-07-21 DIAGNOSIS — R630 Anorexia: Secondary | ICD-10-CM | POA: Diagnosis not present

## 2021-07-21 DIAGNOSIS — F0284 Dementia in other diseases classified elsewhere, unspecified severity, with anxiety: Secondary | ICD-10-CM | POA: Diagnosis not present

## 2021-07-22 DIAGNOSIS — R630 Anorexia: Secondary | ICD-10-CM | POA: Diagnosis not present

## 2021-07-22 DIAGNOSIS — G20 Parkinson's disease: Secondary | ICD-10-CM | POA: Diagnosis not present

## 2021-07-22 DIAGNOSIS — R1310 Dysphagia, unspecified: Secondary | ICD-10-CM | POA: Diagnosis not present

## 2021-07-22 DIAGNOSIS — Z8673 Personal history of transient ischemic attack (TIA), and cerebral infarction without residual deficits: Secondary | ICD-10-CM | POA: Diagnosis not present

## 2021-07-22 DIAGNOSIS — F0284 Dementia in other diseases classified elsewhere, unspecified severity, with anxiety: Secondary | ICD-10-CM | POA: Diagnosis not present

## 2021-07-22 DIAGNOSIS — F0283 Dementia in other diseases classified elsewhere, unspecified severity, with mood disturbance: Secondary | ICD-10-CM | POA: Diagnosis not present

## 2021-07-23 DIAGNOSIS — F0284 Dementia in other diseases classified elsewhere, unspecified severity, with anxiety: Secondary | ICD-10-CM | POA: Diagnosis not present

## 2021-07-23 DIAGNOSIS — Z8673 Personal history of transient ischemic attack (TIA), and cerebral infarction without residual deficits: Secondary | ICD-10-CM | POA: Diagnosis not present

## 2021-07-23 DIAGNOSIS — R630 Anorexia: Secondary | ICD-10-CM | POA: Diagnosis not present

## 2021-07-23 DIAGNOSIS — F0283 Dementia in other diseases classified elsewhere, unspecified severity, with mood disturbance: Secondary | ICD-10-CM | POA: Diagnosis not present

## 2021-07-23 DIAGNOSIS — G20 Parkinson's disease: Secondary | ICD-10-CM | POA: Diagnosis not present

## 2021-07-23 DIAGNOSIS — R1310 Dysphagia, unspecified: Secondary | ICD-10-CM | POA: Diagnosis not present

## 2021-07-24 DIAGNOSIS — R630 Anorexia: Secondary | ICD-10-CM | POA: Diagnosis not present

## 2021-07-24 DIAGNOSIS — F0283 Dementia in other diseases classified elsewhere, unspecified severity, with mood disturbance: Secondary | ICD-10-CM | POA: Diagnosis not present

## 2021-07-24 DIAGNOSIS — G20 Parkinson's disease: Secondary | ICD-10-CM | POA: Diagnosis not present

## 2021-07-24 DIAGNOSIS — Z8673 Personal history of transient ischemic attack (TIA), and cerebral infarction without residual deficits: Secondary | ICD-10-CM | POA: Diagnosis not present

## 2021-07-24 DIAGNOSIS — R1310 Dysphagia, unspecified: Secondary | ICD-10-CM | POA: Diagnosis not present

## 2021-07-24 DIAGNOSIS — F0284 Dementia in other diseases classified elsewhere, unspecified severity, with anxiety: Secondary | ICD-10-CM | POA: Diagnosis not present

## 2021-07-25 DIAGNOSIS — F0283 Dementia in other diseases classified elsewhere, unspecified severity, with mood disturbance: Secondary | ICD-10-CM | POA: Diagnosis not present

## 2021-07-25 DIAGNOSIS — G20 Parkinson's disease: Secondary | ICD-10-CM | POA: Diagnosis not present

## 2021-07-25 DIAGNOSIS — R630 Anorexia: Secondary | ICD-10-CM | POA: Diagnosis not present

## 2021-07-25 DIAGNOSIS — Z8673 Personal history of transient ischemic attack (TIA), and cerebral infarction without residual deficits: Secondary | ICD-10-CM | POA: Diagnosis not present

## 2021-07-25 DIAGNOSIS — R1310 Dysphagia, unspecified: Secondary | ICD-10-CM | POA: Diagnosis not present

## 2021-07-25 DIAGNOSIS — F0284 Dementia in other diseases classified elsewhere, unspecified severity, with anxiety: Secondary | ICD-10-CM | POA: Diagnosis not present

## 2021-07-26 DIAGNOSIS — R1310 Dysphagia, unspecified: Secondary | ICD-10-CM | POA: Diagnosis not present

## 2021-07-26 DIAGNOSIS — R630 Anorexia: Secondary | ICD-10-CM | POA: Diagnosis not present

## 2021-07-26 DIAGNOSIS — G20 Parkinson's disease: Secondary | ICD-10-CM | POA: Diagnosis not present

## 2021-07-26 DIAGNOSIS — F0283 Dementia in other diseases classified elsewhere, unspecified severity, with mood disturbance: Secondary | ICD-10-CM | POA: Diagnosis not present

## 2021-07-26 DIAGNOSIS — F0284 Dementia in other diseases classified elsewhere, unspecified severity, with anxiety: Secondary | ICD-10-CM | POA: Diagnosis not present

## 2021-07-26 DIAGNOSIS — Z8673 Personal history of transient ischemic attack (TIA), and cerebral infarction without residual deficits: Secondary | ICD-10-CM | POA: Diagnosis not present

## 2021-08-13 DEATH — deceased

## 2022-01-03 ENCOUNTER — Ambulatory Visit: Payer: Medicare Other | Admitting: Neurology
